# Patient Record
Sex: Female | Born: 1935 | Race: White | Hispanic: No | Smoking: Never smoker
Health system: Southern US, Community
[De-identification: ages and names within clinical notes are randomized; demographics above are authoritative.]

## PROBLEM LIST (undated history)

## (undated) DIAGNOSIS — E119 Type 2 diabetes mellitus without complications: Secondary | ICD-10-CM

## (undated) DIAGNOSIS — C50919 Malignant neoplasm of unspecified site of unspecified female breast: Secondary | ICD-10-CM

## (undated) DIAGNOSIS — Z8719 Personal history of other diseases of the digestive system: Secondary | ICD-10-CM

## (undated) DIAGNOSIS — K589 Irritable bowel syndrome without diarrhea: Secondary | ICD-10-CM

## (undated) DIAGNOSIS — G709 Myoneural disorder, unspecified: Secondary | ICD-10-CM

## (undated) DIAGNOSIS — K219 Gastro-esophageal reflux disease without esophagitis: Secondary | ICD-10-CM

## (undated) DIAGNOSIS — M1990 Unspecified osteoarthritis, unspecified site: Secondary | ICD-10-CM

## (undated) DIAGNOSIS — E785 Hyperlipidemia, unspecified: Secondary | ICD-10-CM

## (undated) DIAGNOSIS — E875 Hyperkalemia: Secondary | ICD-10-CM

## (undated) DIAGNOSIS — I10 Essential (primary) hypertension: Secondary | ICD-10-CM

## (undated) DIAGNOSIS — N189 Chronic kidney disease, unspecified: Secondary | ICD-10-CM

## (undated) DIAGNOSIS — M810 Age-related osteoporosis without current pathological fracture: Secondary | ICD-10-CM

## (undated) DIAGNOSIS — Z9221 Personal history of antineoplastic chemotherapy: Secondary | ICD-10-CM

## (undated) HISTORY — PX: COLONOSCOPY: SHX174

## (undated) HISTORY — PX: SKIN CANCER EXCISION: SHX779

## (undated) HISTORY — DX: Essential (primary) hypertension: I10

## (undated) HISTORY — DX: Type 2 diabetes mellitus without complications: E11.9

## (undated) HISTORY — DX: Age-related osteoporosis without current pathological fracture: M81.0

## (undated) HISTORY — DX: Hyperlipidemia, unspecified: E78.5

## (undated) HISTORY — DX: Chronic kidney disease, unspecified: N18.9

## (undated) HISTORY — PX: TONSILLECTOMY: SUR1361

---

## 1973-06-24 HISTORY — PX: ABDOMINAL HYSTERECTOMY: SHX81

## 2000-07-04 ENCOUNTER — Encounter: Admission: RE | Admit: 2000-07-04 | Discharge: 2000-07-04 | Payer: Self-pay | Admitting: Internal Medicine

## 2000-07-04 ENCOUNTER — Encounter: Payer: Self-pay | Admitting: Internal Medicine

## 2002-03-04 ENCOUNTER — Ambulatory Visit (HOSPITAL_COMMUNITY): Admission: RE | Admit: 2002-03-04 | Discharge: 2002-03-04 | Payer: Self-pay | Admitting: Gastroenterology

## 2002-03-04 ENCOUNTER — Encounter (INDEPENDENT_AMBULATORY_CARE_PROVIDER_SITE_OTHER): Payer: Self-pay | Admitting: Specialist

## 2006-04-01 ENCOUNTER — Inpatient Hospital Stay (HOSPITAL_COMMUNITY): Admission: AD | Admit: 2006-04-01 | Discharge: 2006-04-05 | Payer: Self-pay | Admitting: Internal Medicine

## 2006-04-17 ENCOUNTER — Encounter: Admission: RE | Admit: 2006-04-17 | Discharge: 2006-04-17 | Payer: Self-pay | Admitting: Internal Medicine

## 2007-01-15 ENCOUNTER — Encounter: Admission: RE | Admit: 2007-01-15 | Discharge: 2007-01-15 | Payer: Self-pay | Admitting: Orthopaedic Surgery

## 2007-06-25 HISTORY — PX: CATARACT EXTRACTION: SUR2

## 2009-03-14 ENCOUNTER — Encounter: Admission: RE | Admit: 2009-03-14 | Discharge: 2009-03-14 | Payer: Self-pay | Admitting: Rheumatology

## 2010-09-12 ENCOUNTER — Other Ambulatory Visit: Payer: Self-pay | Admitting: Dermatology

## 2010-11-09 NOTE — H&P (Signed)
NAMECANDY, LOHN                 ACCOUNT NO.:  [id number]   MEDICAL RECORD NO.:  [id number]          PATIENT TYPE:  INP   LOCATION:  5149                         FACILITY:  [facility]   PHYSICIAN:  Nehemiah Settle, M.D.    DATE OF BIRTH:  12-Dec-1931   DATE OF ADMISSION:  04/01/2006  DATE OF DISCHARGE:                                HISTORY & PHYSICAL   ADMIT HISTORY AND PHYSICAL:   CHIEF COMPLAINT:  Nausea, weakness.   HISTORY OF PRESENT ILLNESS:  Ms. Bruggeman is a 77 year old white female patient  of Dr. Nehemiah Settle who presents with a 10-day acute illness that began  with abdominal cramping and bloody stools.  She was seen in the [facility] at that time and diagnosed with a probable diverticular bleed.  She was discharged on Cipro and Flagyl.  In followup with our office 2 days  later, complained of feeling weak, but overall improved with complete  resolution of bloody stools and marked improvement of her abdominal pain.  CBC at that time showed hemoglobin of 12.5 and white cell count 11,100.  She  developed nausea after her followup visit and was advised to stop the  Flagyl.  She stopped Flagyl and Cipro on March 28, 2006, but the nausea  persisted.  On her followup visit to the office today, a CBC was  unremarkable, but chemistry revealed a creatinine of 8.5 with BUN of 51.  She is being admitted for the same.   REVIEW OF SYSTEMS:  No fever; some chills, fatigued, slightly dizzy.  Chest:  Denies cough, chest pain or shortness of breath.  GI:  Denies abdominal  pain.  No rectal bleeding.  Has a loose, brown stool every day.  Had 1 black  stool last week.  Nausea, as above.  GU:  Denies problems.  Extremities:  Has had no edema.   PAST MEDICAL HISTORY:  1. Allergic rhinitis.  2. GERD.  3. Gout.  4. IBS.  5. Osteoarthritis.  6. Hypertension.  7. Hypercholesterolemia.   PAST SURGICAL HISTORY:  1. Hysterectomy.  2. Basal cell epithelioma.   MEDICATIONS:  1.  Prilosec OTC 20 mg 1 a day.  2. Premarin 0.625 mg a day.  3. Denavir p.r.n. for fever blisters.  4. Viactiv t.i.d.  5. Lisinopril/hydrochlorothiazide 20/25 one a day.  6. Vitamin D 50,000 units twice monthly.  7. Lipitor 20 mg daily.  8. Valium 5 mg 1/2 tablet at bedtime daily p.r.n.   ALLERGIES:  1. SULFA.  2. CODEINE.  3. Intolerance to FLAGYL.   SOCIAL HISTORY:  Denies smoking.  Occasional ETOH.   FAMILY HISTORY:  Father died at age 79 from a cerebral hemorrhage.  Mother  died at age 70.  She had hypertension and osteoporotic fractures.   OBJECTIVE DATA:  VITAL SIGNS:  Weight is 157, which is stable.  Temperature  97.6.  Pulse 68.  Respirations 20.  Blood pressure is 100/60.  She is not  orthostatic.  GENERAL:  She is pale; appears mildly ill.  HEENT:  Okay.  NECK:  No JVD.  No adenopathy.  HEART:  Normal S1, S2 without murmur or S3.  LUNGS:  Breath sounds are clear throughout.  ABDOMEN:  Bowel sounds present, all 4 quadrants.  Very slight tenderness in  the left lower quadrant, but there is no guarding or rebounding.  RECTAL:  Heme-negative stool.  EXTREMITY EXAM:  Some tenderness at the left great toe MTP joint consistent  with flare of gout.  NEUROLOGICAL EXAM:  Cranial nerves 2-12 intact.  Gait steady.  Speech  appropriate.   LABORATORY DATA:  CBC reveals hemoglobin 12.5, hematocrit 37.0, RBC 3.73,  MCV 99.4, MCHC 33.7, RDW 11.3, platelet count 241,000, WBC count was 6100.  Chemistry test reveals glucose 138, BUN 51, creatinine 8.5, sodium 138,  potassium 3.7, chloride 99, CO2 24, calcium 9.2, total protein 6.2, albumin  3.4, anion gap 18.7, total bili 0.7, ALP 82, AST 20, ALT 19.  Urinalysis  unremarkable.   IMPRESSION:  1. Acute renal failure.  2. Recent abdominal pain and gastrointestinal bleed.   PLAN:  Admit.  Clear liquids.  IV fluids.  Get renal consult and renal  ultrasound.  Dr. Maxwell Caul and associates to follow.      Tamera C. Judeen Hammans, M.D.  Electronically Signed    TCL/MEDQ  D:  04/01/2006  T:  04/02/2006  Job:  [id number]

## 2010-11-09 NOTE — Op Note (Signed)
   Rebecca Hall, Rebecca Hall                           ACCOUNT NO.:  [id number]   MEDICAL RECORD NO.:  [id number]                   PATIENT TYPE:  AMB   LOCATION:  ENDO                                 FACILITY:  [facility]   PHYSICIAN:  Garlan Fair, M.D.             DATE OF BIRTH:  28-Jul-1931   DATE OF PROCEDURE:  03/04/2002  DATE OF DISCHARGE:                                 OPERATIVE REPORT   REFERRING PHYSICIAN:  Jeneen Rinks C. Maxwell Caul, M.D.   PROCEDURE PERFORMED:  Colonoscopy.   ENDOSCOPIST:  Garlan Fair, M.D.   INDICATIONS FOR PROCEDURE:  The patient is a 77 year old  female born  [birth year].  The patient is scheduled to undergo a diagnostic  colonoscopy to evaluate intermittent painless hematochezia.  A recent  anoscopy revealed internal hemorrhoids with stigmata of recent bleeding.   I discussed with the patient the complications associated with colonoscopy  and polypectomy including a 15 per 1000 risk of bleeding and 4 per 1000 risk  of colon perforation requiring surgical repair.  The patient has signed the  operative permit.   PREMEDICATION:  Fentanyl 50 mcg, Versed 5 mg.   INSTRUMENT USED:  Pediatric Olympus video colonoscope.   DESCRIPTION OF PROCEDURE:  After obtaining informed consent, the patient was  placed in the left lateral decubitus position.  I administered intravenous  fentanyl and intravenous Versed to achieve conscious sedation for the  procedure.  The patient's blood pressure, oxygen saturations and cardiac  rhythm were monitored throughout the procedure and documented in the medical  record.   Anal inspection was normal.  Digital rectal exam was normal.  The pediatric  Olympus video colonoscope was introduced into the rectum and easily advanced  to the cecum.  Colonic preparation for the exam today was excellent.   Rectum:  Normal.   Sigmoid colon and descending colon:  Normal.   Splenic flexure:  From the splenic flexure, a 2 mm sessile polyp  was removed  with the electrocautery snare and submitted for pathological interpretation.   Transverse colon:  Normal.   Hepatic flexure:  Normal.   Ascending colon:  Normal.   Ileocecal valve and cecum:  Normal.   ASSESSMENT:  A 2 mm sessile polyp was removed from the splenic flexure and  submitted for pathological interpretation;  otherwise normal  proctocolonoscopy to the cecum.   RECOMMENDATIONS:  Repeat colonoscopy in five years.                                                 Garlan Fair, M.D.    MKJ/MEDQ  D:  03/04/2002  T:  03/04/2002  Job:  [id number]   cc:   Alysia Penna. Maxwell Caul, M.D.

## 2010-11-09 NOTE — Consult Note (Signed)
Rebecca Hall, Rebecca Hall                 ACCOUNT NO.:  [id number]   MEDICAL RECORD NO.:  [id number]          PATIENT TYPE:  INP   LOCATION:  5149                         FACILITY:  [facility]   PHYSICIAN:  Alvin C. Florene Glen, M.D.  DATE OF BIRTH:  23-Mar-1932   DATE OF CONSULTATION:  DATE OF DISCHARGE:                                   CONSULTATION   I was asked by Dr. Maxwell Caul to see this 77 year old female who is admitted  with acute renal failure.  The patient is followed by Dr. Maxwell Caul and has  about a 10-day history of illnesses accompanied with abdominal cramping and  bloody stools.  She was felt to have diverticular disease, when seen at  [facility], and treated with Flagyl and Cipro, but had some intolerance  to medication with nausea, but had symptomatic improvement of the bloody  stools.  On visit day, she continued to have some malaise and intermittent  dizziness and had laboratory studies done and was found to have a serum  creatinine of 8.1 mg/dL with a BUN of 51 and she is admitted for further  evaluation and treatment.   The patient denies a prior history of kidney problems.  She does not  describe a lower extremity rash associated with any of these symptoms.  Urinalysis done today did show evidence of a specific gravity 1.025, pH 5,  trace blood, no protein was present.  Hemoglobin was 12.5 grams.  Of note,  on June27,2007, serum creatinine was 1.2 mg/dL.   The patient's past history includes:  1. Irritable bowel syndrome.  2. Hypertension.  3. Hypercholesteremia.  4. Gout.  5. GERD.  6. Allergic rhinitis.  7. Prior hysterectomy.  8. Basal cell tumor.   CURRENT MEDICATIONS:  Includes Prilosec, Premarin, vitamin D, Lipitor,  Valium, lisinopril, HCT, vitamin E, vitamin C, vitamin D.   PHYSICAL EXAMINATION:  VITAL SIGNS:  Upon presentation the patient's blood  pressure was 74/50, according to the nursing staff; repeat was 87/50; and  repeat 93/45.  GENERAL:  She is a  pleasant female, quite talkative.  HEENT:  Atraumatic, normocephalic.  Neck veins are flat in the supine  position.  LUNGS:  Clear.  HEART:  Regular rhythm rate.  ABDOMEN:  Mild tenderness diffusely, increased in the left lower quadrant  but this is not dramatic.  EXTREMITIES:  Without significant edema.   ASSESSMENT:  Acute renal failure.  This is most likely hemodynamically  mediated due to hypotension probably from gastrointestinal losses and lack  of oral intake, superimposed upon ACE inhibitor therapy which exacerbated  the hemodynamic effects.  I do not believe this is Henoch-Schonlein purpura  (HSP).   RECOMMENDATIONS:  1. Hold the ACE inhibitor.  2. Continue with intravenous fluids until blood pressure rises.  3. Urinalysis.  4. Renal ultrasound.  5. Foley catheter placement.   Thanks for letting us see this patient.      Darrold Span. Florene Glen, M.D.  Electronically Signed     ACP/MEDQ  D:  04/01/2006  T:  04/02/2006  Job:  [id number]   cc:   Jeneen Rinks  Maxwell Caul, M.D.

## 2010-11-09 NOTE — Discharge Summary (Signed)
NAMEBRITTINIE, Rebecca Hall                 ACCOUNT NO.:  [id number]   MEDICAL RECORD NO.:  [id number]          PATIENT TYPE:  INP   LOCATION:  [id number]                         FACILITY:  [facility]   PHYSICIAN:  Nehemiah Settle, M.D.    DATE OF BIRTH:  [birth year]   DATE OF ADMISSION:  04/01/2006  DATE OF DISCHARGE:  04/05/2006                               DISCHARGE SUMMARY   ADMITTING DIAGNOSIS:  Acute renal failure.   DISCHARGE DIAGNOSES:  1. Acute renal failure secondary to dehydration.  2. Recent gastrointestinal (GI) illness.  3. Anemia.  4. Gastroesophageal reflux disease (GERD).  5. Probable gout.  6. Arm pain ? Etiology.  7. Gallstone.  8. Hypertension.  9. Hypercholesterolemia.   HISTORY OF PRESENT ILLNESS:  The patient is a 77 year old white female  who had a 10-day illness that began with abdominal cramping and bloody  stools.  She was seen in the [facility] and diagnosed with  probable diverticular bleed.  She was placed on Cipro and Flagyl.  She  had a general feeling of weakness 2 days later when seen in our office,  but her bloody stools and diarrhea had resolved.  CBC was normal and  white count was slightly elevated.  Subsequent to that, she was seen in  our office a week later and found to be nauseated and still felt very  poorly.  CBC was normal but chemistries revealed a creatinine of 8.5 and  a BUN of 51.   HOSPITAL COURSE:  The patient was admitted and was placed on IV fluids.  She was seen in consultation by renal who thought the patient had acute  renal failure that was hemodynamically mediated through hypertension,  ACE inhibition, and low p.o. intake.  They recommended holding her ACE  inhibitor and giving her IV fluid, increase her blood pressure.  A Foley  catheter was placed.  Over the subsequent several days, her creatinine  fell and was eventually in the 2.3 range when she was discharged.  She  felt much better.  She did have an attack of gout in her  left foot with  a tender bursae TP with mild heat.  She was treated with Prednisone for  that.  She was mildly anemic and her iron studies were consistent with  anemia of chronic disease.  She was noted to have hypercholesterolemia  but her Lipitor was being held because of her general medical condition.  She steadily improved and was discharged for followup in our office.   DISCHARGE MEDICATIONS:  1. Prednisone 10 mg b.i.d. x3 days.  2. Premarin 0.625 mg daily.  3. Prilosec 20 mg daily.  She is to hold Lisinopril and HCT as well as Lipitor for now.   FOLLOWUP:  She will call to make an appointment for metabolic profile on  October 15th.  She will call us if she has any further problems.   DISCHARGE DIET:  As tolerated.   ACTIVITY:  As tolerated.      Nehemiah Settle, M.D.  Electronically Signed     JO/MEDQ  D:  05/14/2006  T:  05/14/2006  Job:  [id number]

## 2012-06-24 HISTORY — PX: BREAST BIOPSY: SHX20

## 2012-06-24 HISTORY — PX: BREAST LUMPECTOMY: SHX2

## 2012-10-14 ENCOUNTER — Ambulatory Visit (INDEPENDENT_AMBULATORY_CARE_PROVIDER_SITE_OTHER): Payer: Medicare Other

## 2012-10-14 ENCOUNTER — Ambulatory Visit (INDEPENDENT_AMBULATORY_CARE_PROVIDER_SITE_OTHER): Payer: Medicare Other | Admitting: Neurology

## 2012-10-14 DIAGNOSIS — R209 Unspecified disturbances of skin sensation: Secondary | ICD-10-CM

## 2012-10-14 DIAGNOSIS — G63 Polyneuropathy in diseases classified elsewhere: Secondary | ICD-10-CM

## 2012-10-14 DIAGNOSIS — Z0289 Encounter for other administrative examinations: Secondary | ICD-10-CM

## 2012-10-14 NOTE — Procedures (Signed)
HISTORY:  Rebecca Hall is a 79 year old patient with a two-year history of numbness and tingling sensations in the feet bilaterally. The patient has noted some slight gait instability, but she denies any falls. The patient also reports low back pain without radiation into the legs. The patient is being evaluated for a possible peripheral neuropathy.  NERVE CONDUCTION STUDIES:  Nerve conduction studies were performed on all 4 extremities. The distal motor latencies and motor amplitudes for the median and ulnar nerves were normal bilaterally. The F wave latencies and nerve conduction velocities for these nerves were normal bilaterally. The sensory latencies for the median nerves were normal bilaterally, slightly prolonged for the right ulnar nerve, normal for the left ulnar nerve.  Nerve conduction studies on the lower extremities revealed no response for the left peroneal nerve, with a normal distal motor latency and borderline normal motor amplitude for the right peroneal nerve. The distal motor latencies and motor amplitudes for the posterior tibial nerves were normal bilaterally. The nerve conduction velocities for the right peroneal nerve and for the posterior tibial nerves were normal bilaterally. The peroneal sensory latencies were absent bilaterally. The H reflex latencies were absent bilaterally.  EMG STUDIES:  EMG study was performed on the right lower extremity:  The tibialis anterior muscle reveals 2 to 3K motor units with full recruitment. No fibrillations or positive waves were seen. The peroneus tertius muscle reveals 2 to 3K motor units with full recruitment. No fibrillations or positive waves were seen. The medial gastrocnemius muscle reveals 1 to 3K motor units with full recruitment. No fibrillations or positive waves were seen. The vastus lateralis muscle reveals 2 to 4K motor units with full recruitment. No fibrillations or positive waves were seen. The iliopsoas muscle reveals 2  to 4K motor units with full recruitment. No fibrillations or positive waves were seen. The biceps femoris muscle (long head) reveals 2 to 4K motor units with full recruitment. No fibrillations or positive waves were seen. The lumbosacral paraspinal muscles were tested at 3 levels, and revealed no abnormalities of insertional activity at all 3 levels tested. There was good relaxation.  EMG study was performed on the left lower extremity:  The tibialis anterior muscle reveals 2 to 4K motor units with full recruitment. No fibrillations or positive waves were seen. The peroneus tertius muscle reveals 2 to 4K motor units with full recruitment. No fibrillations or positive waves were seen. The medial gastrocnemius muscle reveals 1 to 3K motor units with full recruitment. No fibrillations or positive waves were seen. The vastus lateralis muscle reveals 2 to 4K motor units with full recruitment. No fibrillations or positive waves were seen. The iliopsoas muscle reveals 2 to 4K motor units with full recruitment. No fibrillations or positive waves were seen. The biceps femoris muscle (long head) reveals 2 to 4K motor units with full recruitment. No fibrillations or positive waves were seen. The lumbosacral paraspinal muscles were tested at 3 levels, and revealed no abnormalities of insertional activity at all 3 levels tested. There was good relaxation.    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 Nerve conduction studies suggest a significant left peroneal neuropathy, but EMG evaluation of both lower extremities indicates that the left peroneal neuropathy is likely distal in nature, not at the fibular head. No evidence of an overlying lumbosacral radiculopathy was seen on either side.  Jill Alexanders MD 10/14/2012 4:45 PM  [facility] [address] [place], [place] [address]  Phone [phone] Fax [phone]

## 2013-03-15 ENCOUNTER — Other Ambulatory Visit: Payer: Self-pay

## 2013-03-15 DIAGNOSIS — C50919 Malignant neoplasm of unspecified site of unspecified female breast: Secondary | ICD-10-CM

## 2013-03-15 HISTORY — DX: Malignant neoplasm of unspecified site of unspecified female breast: C50.919

## 2013-03-29 ENCOUNTER — Ambulatory Visit (INDEPENDENT_AMBULATORY_CARE_PROVIDER_SITE_OTHER): Payer: Medicare Other | Admitting: General Surgery

## 2013-03-29 ENCOUNTER — Encounter (INDEPENDENT_AMBULATORY_CARE_PROVIDER_SITE_OTHER): Payer: Self-pay | Admitting: General Surgery

## 2013-03-29 VITALS — BP 120/72 | HR 72 | Temp 98.2°F | Resp 14 | Ht 61.0 in | Wt 157.4 lb

## 2013-03-29 DIAGNOSIS — C50419 Malignant neoplasm of upper-outer quadrant of unspecified female breast: Secondary | ICD-10-CM

## 2013-03-29 DIAGNOSIS — C50411 Malignant neoplasm of upper-outer quadrant of right female breast: Secondary | ICD-10-CM

## 2013-03-29 NOTE — Progress Notes (Signed)
Patient ID: Rebecca Hall, female   DOB: 14-Aug-1931, 79 y.o.   MRN: [id number]  Chief Complaint  Patient presents with  . New Evaluation    eval new br cancer    HPI Rebecca Hall is a 79 y.o. female.  Referred by Dr Christene Slates HPI 45 yof who has no complaints referable to either breast and presents after undergoing routine screening mm that showed a 4 mm asymmetry in the right breast.  US showed an irregular mass.  US guided biopsy was obtained of the solid mass located in the 12 oclock position anterior depth 5 cm from nipple.  A tissue marker was placed.  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  She comes in with her family to disuss her options.  Past Medical History  Diagnosis Date  . Hyperlipidemia   . Hypertension   . Chronic kidney disease   . Osteoporosis     Past Surgical History  Procedure Laterality Date  . Uterine fibroid surgery      removed utures  . Cataract extraction  2009    both  . Skin cancer excision      head, face, hand ..multiple years    Family History  Problem Relation Age of Onset  . Cancer Maternal Aunt     colon cancer  . Cancer Maternal Uncle     lung  . Cancer Maternal Grandmother     pancreas    Social History History  Substance Use Topics  . Smoking status: Never Smoker   . Smokeless tobacco: Never Used  . Alcohol Use: No    Allergies  Allergen Reactions  . Levaquin [Levofloxacin In D5w] Anaphylaxis and Nausea Only    Shaking real bad on medicaton  . Codeine Nausea And Vomiting    Pass out  . Sulfa Antibiotics Swelling    Current Outpatient Prescriptions  Medication Sig Dispense Refill  . allopurinol (ZYLOPRIM) 100 MG tablet       . allopurinol (ZYLOPRIM) 300 MG tablet Take 300 mg by mouth daily.      Marland Kitchen aspirin 325 MG tablet Take 325 mg by mouth daily.      Marland Kitchen atorvastatin (LIPITOR) 20 MG tablet       . BOOSTRIX 5-2.5-18.5 injection       .  calcium carbonate (OS-CAL) 600 MG TABS tablet Take 600 mg by mouth 2 (two) times daily with a meal.      . diclofenac sodium (VOLTAREN) 1 % GEL Apply 2 g topically.      . diphenhydramine-acetaminophen (TYLENOL PM) 25-500 MG TABS Take 1 tablet by mouth at bedtime as needed.      Marland Kitchen estradiol (CLIMARA - DOSED IN MG/24 HR) 0.05 mg/24hr patch Place 1 patch onto the skin once a week.      . furosemide (LASIX) 40 MG tablet       . lisinopril (PRINIVIL,ZESTRIL) 20 MG tablet Take 20 mg by mouth daily.      Marland Kitchen LORazepam (ATIVAN) 0.5 MG tablet       . minoxidil (ROGAINE) 2 % external solution Apply topically 2 (two) times daily.      . Multiple Vitamin (MULTIVITAMIN WITH MINERALS) TABS tablet Take 1 tablet by mouth daily.      Marland Kitchen omeprazole (PRILOSEC) 20 MG capsule       . predniSONE (DELTASONE) 5 MG tablet       . Vitamin D,  Ergocalciferol, (DRISDOL) 50000 UNITS CAPS capsule Take 50,000 Units by mouth.       No current facility-administered medications for this visit.    Review of Systems Review of Systems  Constitutional: Negative for fever, chills and unexpected weight change.  HENT: Negative for hearing loss, congestion, sore throat, trouble swallowing and voice change.   Eyes: Negative for visual disturbance.  Respiratory: Negative for cough and wheezing.   Cardiovascular: Positive for leg swelling. Negative for chest pain and palpitations.  Gastrointestinal: Positive for diarrhea. Negative for nausea, vomiting, abdominal pain, constipation, blood in stool, abdominal distention and anal bleeding.  Genitourinary: Negative for hematuria, vaginal bleeding and difficulty urinating.  Musculoskeletal: Positive for arthralgias.  Skin: Negative for rash and wound.  Neurological: Positive for headaches. Negative for seizures and syncope.  Hematological: Negative for adenopathy. Does not bruise/bleed easily.  Psychiatric/Behavioral: Negative for confusion.    Blood pressure 120/72, pulse 72,  temperature 98.2 F (36.8 C), temperature source Temporal, resp. rate 14, height 5\' 1"  (1.549 m), weight 157 lb 6.4 oz (71.396 kg).  Physical Exam Physical Exam  Constitutional: She appears well-developed and well-nourished.  Cardiovascular: Normal rate, regular rhythm and normal heart sounds.   Pulmonary/Chest: Effort normal and breath sounds normal. She has no wheezes. She has no rales. Right breast exhibits no inverted nipple, no mass, no nipple discharge, no skin change and no tenderness. Left breast exhibits no inverted nipple, no mass, no nipple discharge, no skin change and no tenderness.  Lymphadenopathy:    She has no cervical adenopathy.    Data Reviewed Mm, [place], path reviewed  Assessment    Right breast cancer, clinical stage I    Plan    Breast MRI followed likely by surgery  We discussed multiple options and decided esp with her2 positivity to pursue mr.  She understands this likely does not help with survival and may require more biopsies.  i think for her this is reasonable though and she would very much like to pursue.  After that if nothing else shows up I think she is a good candidate for lump/sn. We Hall hold on port as decision for chemo/anti her2 therapy Hall be based on final pathology We discussed the staging and pathophysiology of breast cancer. We discussed all of the different options for treatment for breast cancer including surgery, chemotherapy, radiation therapy, Herceptin, and antiestrogen therapy.   We discussed a sentinel lymph node biopsy as she does not appear to having lymph node involvement right now. We discussed the performance of that with injection of radioactive tracer and blue dye. We discussed that she would have an incision underneath her axillary hairline. We discussed that there is a chance of having a positive node with a sentinel lymph node biopsy and we Hall await the permanent pathology to make any other first further decisions in terms of  her treatment. One of these options might be to return to the operating room to perform an axillary lymph node dissection. We discussed about a 1-2% risk lifetime of chronic shoulder pain as well as lymphedema associated with a sentinel lymph node biopsy.  We discussed the options for treatment of the breast cancer which included lumpectomy versus a mastectomy. We discussed the performance of the lumpectomy with a wire placement. We discussed a 10-20% chance of a positive margin requiring reexcision in the operating room. We also discussed that she may need radiation therapy or antiestrogen therapy or both if she undergoes lumpectomy. We discussed the mastectomy and  the postoperative care for that as well. We discussed that there is no difference in her survival whether she undergoes lumpectomy with radiation therapy or antiestrogen therapy versus a mastectomy.  We discussed the risks of operation including bleeding, infection, possible reoperation. She understands her further therapy Hall be based on what her stages at the time of her operation.         Yashika Mask 03/29/2013, 10:39 PM

## 2013-03-30 ENCOUNTER — Telehealth (INDEPENDENT_AMBULATORY_CARE_PROVIDER_SITE_OTHER): Payer: Self-pay | Admitting: *Deleted

## 2013-03-30 NOTE — Telephone Encounter (Signed)
Spoke with pt to inform her of her appt at [facility] at [address] for her MRI on 04/05/13 with an arrival time of 4:15.

## 2013-04-05 ENCOUNTER — Ambulatory Visit
Admission: RE | Admit: 2013-04-05 | Discharge: 2013-04-05 | Disposition: A | Discharge status: Home / Self Care | Payer: Medicare Other | Source: Ambulatory Visit | Attending: General Surgery | Admitting: General Surgery

## 2013-04-05 DIAGNOSIS — C50411 Malignant neoplasm of upper-outer quadrant of right female breast: Secondary | ICD-10-CM

## 2013-04-05 MED ORDER — GADOBENATE DIMEGLUMINE 529 MG/ML IV SOLN
14.0000 mL | Freq: Once | INTRAVENOUS | Status: AC | PRN
  Administered 2013-04-05: 14 mL via INTRAVENOUS

## 2013-04-06 ENCOUNTER — Telehealth (INDEPENDENT_AMBULATORY_CARE_PROVIDER_SITE_OTHER): Payer: Self-pay | Admitting: *Deleted

## 2013-04-06 NOTE — Telephone Encounter (Signed)
Patient called to ask about her MRI results and if her surgery has been scheduled.  Explained that I would send a message to Dr. Donne Hazel and Lars Mage for an update.  Patient states understanding.

## 2013-04-07 ENCOUNTER — Other Ambulatory Visit (INDEPENDENT_AMBULATORY_CARE_PROVIDER_SITE_OTHER): Payer: Self-pay | Admitting: General Surgery

## 2013-04-07 DIAGNOSIS — R928 Other abnormal and inconclusive findings on diagnostic imaging of breast: Secondary | ICD-10-CM

## 2013-04-07 NOTE — Telephone Encounter (Signed)
i will call her later today.  Just saw this am. They recommended mr biopsy but I will plan on scheduling her if you can let me know when this is scheduled

## 2013-04-08 ENCOUNTER — Other Ambulatory Visit (INDEPENDENT_AMBULATORY_CARE_PROVIDER_SITE_OTHER): Payer: Self-pay | Admitting: General Surgery

## 2013-04-08 DIAGNOSIS — C50411 Malignant neoplasm of upper-outer quadrant of right female breast: Secondary | ICD-10-CM

## 2013-04-08 MED ORDER — DIAZEPAM 5 MG PO TABS: 5.0000 mg | ORAL_TABLET | Freq: Once | ORAL | Status: DC

## 2013-04-08 NOTE — Telephone Encounter (Signed)
Pt calling today with more questions regarding her MR guided bx.  She is scheduled for her second bx of her left breast on 04/12/13 at 7:15a.m.  She still has several questions, and is VERY anxious about having a second procedure.  She did not do well with her first MR and would like to know if Dr. Donne Hazel would give her something to calm her nerves.  I explained that I would relay her concerns to Dr. Donne Hazel and ask him to call her today if possible.  Pt was appreciative.

## 2013-04-09 ENCOUNTER — Other Ambulatory Visit (INDEPENDENT_AMBULATORY_CARE_PROVIDER_SITE_OTHER): Payer: Self-pay

## 2013-04-12 ENCOUNTER — Ambulatory Visit
Admission: RE | Admit: 2013-04-12 | Discharge: 2013-04-12 | Disposition: A | Discharge status: Home / Self Care | Payer: Medicare Other | Source: Ambulatory Visit | Attending: General Surgery | Admitting: General Surgery

## 2013-04-12 ENCOUNTER — Other Ambulatory Visit: Payer: Self-pay | Admitting: Diagnostic Radiology

## 2013-04-12 DIAGNOSIS — R928 Other abnormal and inconclusive findings on diagnostic imaging of breast: Secondary | ICD-10-CM

## 2013-04-12 HISTORY — PX: BREAST BIOPSY: SHX20

## 2013-04-12 MED ORDER — GADOBENATE DIMEGLUMINE 529 MG/ML IV SOLN
14.0000 mL | Freq: Once | INTRAVENOUS | Status: AC | PRN
  Administered 2013-04-12: 14 mL via INTRAVENOUS

## 2013-04-21 ENCOUNTER — Telehealth (INDEPENDENT_AMBULATORY_CARE_PROVIDER_SITE_OTHER): Payer: Self-pay | Admitting: General Surgery

## 2013-04-21 DIAGNOSIS — C50911 Malignant neoplasm of unspecified site of right female breast: Secondary | ICD-10-CM

## 2013-04-21 NOTE — Telephone Encounter (Signed)
I discussed other biopsies which are negative.  She has 4 mm tumor on mm/us that is larger on mr and her 2 positive.  Will see if she can see oncology prior to next week but would wait on port likely due to size discrepancy.  Will plan on lumpectomy/sn next week

## 2013-04-22 ENCOUNTER — Encounter (HOSPITAL_BASED_OUTPATIENT_CLINIC_OR_DEPARTMENT_OTHER): Payer: Self-pay | Admitting: *Deleted

## 2013-04-22 ENCOUNTER — Telehealth: Payer: Self-pay | Admitting: *Deleted

## 2013-04-22 DIAGNOSIS — C50412 Malignant neoplasm of upper-outer quadrant of left female breast: Secondary | ICD-10-CM

## 2013-04-22 DIAGNOSIS — C50419 Malignant neoplasm of upper-outer quadrant of unspecified female breast: Secondary | ICD-10-CM | POA: Insufficient documentation

## 2013-04-22 NOTE — Telephone Encounter (Signed)
Sent Dawn a staff message asking her to help with arranging this appt ASAP before the pt's sx on 04/28/13.

## 2013-04-22 NOTE — Telephone Encounter (Signed)
Called pt back to inform the appt is at 1100/11:30.  Received verbal understanding.

## 2013-04-22 NOTE — Telephone Encounter (Signed)
Gave pt husband [name] appt for 04/23/13 at 1200/1230 to see Dr. Humphrey Rolls.  Informed Dr. Donne Hazel.

## 2013-04-22 NOTE — Progress Notes (Signed)
To come in for labs and ekg

## 2013-04-22 NOTE — Addendum Note (Signed)
Addended by: Illene Regulus on: 04/22/2013 09:03 AM   Modules accepted: Orders

## 2013-04-23 ENCOUNTER — Ambulatory Visit: Payer: Medicare Other | Admitting: Oncology

## 2013-04-23 ENCOUNTER — Other Ambulatory Visit (HOSPITAL_BASED_OUTPATIENT_CLINIC_OR_DEPARTMENT_OTHER): Payer: Medicare Other

## 2013-04-23 ENCOUNTER — Other Ambulatory Visit: Payer: Medicare Other | Admitting: Lab

## 2013-04-23 ENCOUNTER — Encounter: Payer: Self-pay | Admitting: Oncology

## 2013-04-23 ENCOUNTER — Encounter: Payer: Medicare Other | Admitting: Oncology

## 2013-04-23 ENCOUNTER — Other Ambulatory Visit (HOSPITAL_BASED_OUTPATIENT_CLINIC_OR_DEPARTMENT_OTHER): Payer: Medicare Other | Admitting: Lab

## 2013-04-23 ENCOUNTER — Ambulatory Visit: Payer: Medicare Other

## 2013-04-23 ENCOUNTER — Other Ambulatory Visit: Payer: Self-pay

## 2013-04-23 ENCOUNTER — Ambulatory Visit (HOSPITAL_BASED_OUTPATIENT_CLINIC_OR_DEPARTMENT_OTHER): Payer: Medicare Other

## 2013-04-23 ENCOUNTER — Ambulatory Visit (HOSPITAL_BASED_OUTPATIENT_CLINIC_OR_DEPARTMENT_OTHER): Payer: Medicare Other | Admitting: Oncology

## 2013-04-23 ENCOUNTER — Encounter (HOSPITAL_BASED_OUTPATIENT_CLINIC_OR_DEPARTMENT_OTHER)
Admission: RE | Admit: 2013-04-23 | Discharge: 2013-04-23 | Disposition: A | Discharge status: Home / Self Care | Payer: Medicare Other | Source: Ambulatory Visit | Attending: General Surgery | Admitting: General Surgery

## 2013-04-23 ENCOUNTER — Encounter: Payer: Self-pay | Admitting: *Deleted

## 2013-04-23 ENCOUNTER — Other Ambulatory Visit: Payer: Self-pay | Admitting: Oncology

## 2013-04-23 ENCOUNTER — Telehealth (INDEPENDENT_AMBULATORY_CARE_PROVIDER_SITE_OTHER): Payer: Self-pay

## 2013-04-23 DIAGNOSIS — G5790 Unspecified mononeuropathy of unspecified lower limb: Secondary | ICD-10-CM

## 2013-04-23 DIAGNOSIS — C50412 Malignant neoplasm of upper-outer quadrant of left female breast: Secondary | ICD-10-CM

## 2013-04-23 DIAGNOSIS — Z01812 Encounter for preprocedural laboratory examination: Secondary | ICD-10-CM | POA: Insufficient documentation

## 2013-04-23 DIAGNOSIS — G4700 Insomnia, unspecified: Secondary | ICD-10-CM

## 2013-04-23 DIAGNOSIS — Z01810 Encounter for preprocedural cardiovascular examination: Secondary | ICD-10-CM | POA: Insufficient documentation

## 2013-04-23 DIAGNOSIS — C50919 Malignant neoplasm of unspecified site of unspecified female breast: Secondary | ICD-10-CM

## 2013-04-23 DIAGNOSIS — Z170 Estrogen receptor positive status [ER+]: Secondary | ICD-10-CM

## 2013-04-23 DIAGNOSIS — C50411 Malignant neoplasm of upper-outer quadrant of right female breast: Secondary | ICD-10-CM

## 2013-04-23 LAB — CBC WITH DIFFERENTIAL/PLATELET
BASO%: 1.6 % (ref 0.0–2.0)
Basophils Absolute: 0.1 10*3/uL (ref 0.0–0.1)
EOS%: 3.7 % (ref 0.0–7.0)
Eosinophils Absolute: 0.2 10*3/uL (ref 0.0–0.5)
HCT: 35.7 % (ref 34.8–46.6)
HGB: 11.6 g/dL (ref 11.6–15.9)
LYMPH%: 30.8 % (ref 14.0–49.7)
MCH: 32.4 pg (ref 25.1–34.0)
MCHC: 32.5 g/dL (ref 31.5–36.0)
MCV: 99.8 fL (ref 79.5–101.0)
MONO#: 0.3 10*3/uL (ref 0.1–0.9)
MONO%: 6.8 % (ref 0.0–14.0)
NEUT#: 2.7 10*3/uL (ref 1.5–6.5)
NEUT%: 57.1 % (ref 38.4–76.8)
Platelets: 155 10*3/uL (ref 145–400)
RBC: 3.58 10*6/uL — ABNORMAL LOW (ref 3.70–5.45)
RDW: 14.3 % (ref 11.2–14.5)
WBC: 4.6 10*3/uL (ref 3.9–10.3)
lymph#: 1.4 10*3/uL (ref 0.9–3.3)

## 2013-04-23 LAB — COMPREHENSIVE METABOLIC PANEL (CC13)
ALT: 23 U/L (ref 0–55)
AST: 28 U/L (ref 5–34)
Albumin: 3.6 g/dL (ref 3.5–5.0)
Alkaline Phosphatase: 100 U/L (ref 40–150)
Anion Gap: 12 mEq/L — ABNORMAL HIGH (ref 3–11)
BUN: 12.8 mg/dL (ref 7.0–26.0)
CO2: 24 mEq/L (ref 22–29)
Calcium: 9.4 mg/dL (ref 8.4–10.4)
Chloride: 103 mEq/L (ref 98–109)
Creatinine: 1.1 mg/dL (ref 0.6–1.1)
Glucose: 251 mg/dl — ABNORMAL HIGH (ref 70–140)
Potassium: 2.9 mEq/L — CL (ref 3.5–5.1)
Sodium: 140 mEq/L (ref 136–145)
Total Bilirubin: 0.72 mg/dL (ref 0.20–1.20)
Total Protein: 6.9 g/dL (ref 6.4–8.3)

## 2013-04-23 MED ORDER — POTASSIUM CHLORIDE CRYS ER 20 MEQ PO TBCR: 20.0000 meq | EXTENDED_RELEASE_TABLET | Freq: Every day | ORAL | Status: DC

## 2013-04-23 NOTE — Progress Notes (Signed)
ID: Rebecca Hall OB: December 14, 1931  MR#: [id number]  [id number]  PCP: Rebecca Finer, MD GYN:   SU: Rebecca Hall OTHER MD: Rebecca Hall, Rebecca Hall,  Rebecca COMPLAINT: "They found I had breast cancer".  HISTORY OF PRESENT ILLNESS: Rebecca Hall had routine screening mammography at [facility] 03/04/2013 showing a possible mass in the right breast, measuring 4 mm in diameter. Additional views 03/10/2013 confirmed an area of asymmetry in the right breast which was hypoechoic by ultrasonography. Biopsy of this area 03/15/2013 showed (SAA [id number]) and invasive ductal carcinoma, grade 1 or 2, estrogen receptor 100% positive, progesterone receptor 100% positive, with a proliferation marker of 30% and HER-2 amplification by CISH with a ratio of 3.51 and an average HER-2 copy number of 6.85.  On 04/05/2013 the patient underwent bilateral breast MRIs. This showed in the right breast an 8 mm mass at the site of the known biopsy site, with surrounding known masslike enhancement. The entire area of concern measured 1.9 cm. There were no abnormal appearing lymph nodes of concern. In the left breast there were 2 areas of clumped non-masslike enhancement measuring 0.5 cm and 2.4 cm.  The patient's subsequent history is as detailed below  INTERVAL HISTORY: Rebecca Hall was evaluated in the breast clinic 04/23/2013. Her husband unfortunately was unable to accompany her because of his own medical problems.  REVIEW OF SYSTEMS: There were no particular symptoms leading to her mammography, which was routine. She did not feel a mass in the breast and was unable to feel a mass even after she knew it was clear. She has fairly chronic sinus symptoms, and recently was found to have a little bit of wheezing so she was treated with antibiotics and an inhaler temporarily. She doesn't have a cough, phlegm production or fever. She tends to be more diarrhea than the constipation side. She denies unusual headaches, visual changes,  nausea, or vomiting. She has mild stress urinary incontinence. She does not exercise regularly, but what she likes to do is swim and walk. Her peripheral neuropathy, involving both feet, limits her walking. She does have a stationary bike at home which is currentlydisassembled. She has chronic mild insomnia problems. Occasionally her ankles swell. She has chronic heartburn, which is well managed. She has low back pain, which was evaluated with a lumbar MRI 03/14/2009 showing degenerative disease only. She has a history of gout. Otherwise a detailed review of systems today was noncontributory  PAST MEDICAL HISTORY: Past Medical History  Diagnosis Date  . Hyperlipidemia   . Hypertension   . Chronic kidney disease   . Osteoporosis   . GERD (gastroesophageal reflux disease)   . IBS (irritable bowel syndrome)   . Arthritis   . Neuromuscular disorder     peripheral neuropathy feet  . History of lower GI bleeding     PAST SURGICAL HISTORY: Past Surgical History  Procedure Laterality Date  . Cataract extraction  2009    both  . Skin cancer excision      head, face, hand ..multiple years  . Abdominal hysterectomy  1975    No salpingo-oophorectomy  . Tonsillectomy    . Colonoscopy      FAMILY HISTORY Family History  Problem Relation Age of Onset  . Cancer Maternal Aunt     colon cancer  . Cancer Maternal Uncle     lung  . Cancer Maternal Grandmother     pancreas   the patient's father died at the age of 36 from a subarachnoid hemorrhage. The patient's  mother lived to be and 11.48 years old. She lived independently to be and. The patient had one brother, no sisters. There is a history of colon cancer or in a maternal aunts, diagnosed at age 16, and lung cancer in a maternal uncle diagnosed at age 63. There is no history of breast or ovarian cancer in the family  GYNECOLOGIC HISTORY:  Rebecca Hall can't remember when she had her first period. First live birth came at age 87 and she is Rebecca Hall 2.  She she took hormone replacement, namely estrogen, until the 20 14th when she was diagnosed with breast cancer.  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 Her husband Rebecca Hall is retired, he is largely disabled secondary to back problems following a fall. Daughter Rebecca Hall is a retired Pharmacist, hospital living in [place]. 35 older son runs a Engineer, civil (consulting) and business in [place] and did be helps out with that. The patient's son Rebecca Hall lives in [place]. He is an Rebecca Hall. Altogether Rebecca Hall has 4 grandsons, 3 great-grandsons and one great-granddaughter    ADVANCED DIRECTIVES: In place   HEALTH MAINTENANCE: History  Substance Use Topics  . Smoking status: Never Smoker   . Smokeless tobacco: Never Used  . Alcohol Use: No     Colonoscopy: 2011  PAP: Status post hysterectomy  Bone density: Remote (at [facility])  Lipid panel:  Allergies  Allergen Reactions  . Levaquin [Levofloxacin In D5w] Anaphylaxis and Nausea Only    Shaking real bad on medicaton  . Codeine Nausea And Vomiting    Pass out  . Sulfa Antibiotics Swelling    Current Outpatient Prescriptions  Medication Sig Dispense Refill  . allopurinol (ZYLOPRIM) 100 MG tablet Take 100 mg by mouth daily.       Marland Kitchen allopurinol (ZYLOPRIM) 300 MG tablet Take 300 mg by mouth daily.      Marland Kitchen aspirin 325 MG tablet Take 325 mg by mouth daily.      Marland Kitchen atorvastatin (LIPITOR) 20 MG tablet Take 20 mg by mouth daily.       . calcium carbonate (OS-CAL) 600 MG TABS tablet Take 600 mg by mouth 2 (two) times daily with a meal.      . diazepam (VALIUM) 5 MG tablet Take 1 tablet (5 mg total) by mouth once.  1 tablet  0  . diphenhydramine-acetaminophen (TYLENOL PM) 25-500 MG TABS Take 1 tablet by mouth at bedtime as needed.      . furosemide (LASIX) 40 MG tablet Take 40 mg by mouth daily.       Marland Kitchen lisinopril (PRINIVIL,ZESTRIL) 20 MG tablet Take 20 mg by mouth daily.      Marland Kitchen LORazepam  (ATIVAN) 0.5 MG tablet Take 0.5 mg by mouth every 8 (eight) hours.       . minoxidil (ROGAINE) 2 % external solution Apply topically 2 (two) times daily.      . Multiple Vitamin (MULTIVITAMIN WITH MINERALS) TABS tablet Take 1 tablet by mouth daily.      Marland Kitchen omeprazole (PRILOSEC) 20 MG capsule       . potassium chloride SA (K-DUR,KLOR-CON) 20 MEQ tablet Take 1 tablet (20 mEq total) by mouth daily.  90 tablet  12  . Vitamin D, Ergocalciferol, (DRISDOL) 50000 UNITS CAPS capsule Take 50,000 Units by mouth.       No current facility-administered medications for this visit.    OBJECTIVE: Older white woman in no acute distress There  were no vitals filed for this visit.   There is no weight on file to calculate BMI.    ECOG FS:1 - Symptomatic but completely ambulatory Vitals - 1 value per visit [id number]  SYSTOLIC 0000000  DIASTOLIC 71  Pulse 79  Temperature 98  Respirations 18  Weight (lb) 156.3  Height 5\' 1"   BMI 29.55  VISIT REPORT    Ocular: Sclerae unicteric, pupils equal, round and reactive to light Ear-nose-throat: Oropharynx clear, dentition fair Lymphatic: No cervical or supraclavicular adenopathy Lungs no rales or rhonchi, good excursion bilaterally Heart regular rate and rhythm, no murmur appreciated Abd soft, nontender, positive bowel sounds MSK no focal spinal tenderness, no joint edema Neuro: non-focal, well-oriented, appropriate affect Breasts: The right breast is status post recent biopsy. There is no skin or nipple change of concern. I do not palpate a mass. The right axilla is benign. The left breast is status post recent biopsy. It is otherwise unremarkable. Left axilla is unremarkable.   LAB RESULTS:  CMP     Component Value Date/Time   NA 140 04/23/2013 1128   K 2.9* 04/23/2013 1128   CO2 24 04/23/2013 1128   GLUCOSE 251* 04/23/2013 1128   BUN 12.8 04/23/2013 1128   CREATININE 1.1 04/23/2013 1128   CALCIUM 9.4 04/23/2013 1128   PROT 6.9 04/23/2013 1128   ALBUMIN  3.6 04/23/2013 1128   AST 28 04/23/2013 1128   ALT 23 04/23/2013 1128   ALKPHOS 100 04/23/2013 1128   BILITOT 0.72 04/23/2013 1128    I No results found for this basename: SPEP,  UPEP,   kappa and lambda light chains    Lab Results  Component Value Date   WBC 4.6 04/23/2013   NEUTROABS 2.7 04/23/2013   HGB 11.6 04/23/2013   HCT 35.7 04/23/2013   MCV 99.8 04/23/2013   PLT 155 04/23/2013      Chemistry      Component Value Date/Time   NA 140 04/23/2013 1128   K 2.9* 04/23/2013 1128   CO2 24 04/23/2013 1128   BUN 12.8 04/23/2013 1128   CREATININE 1.1 04/23/2013 1128      Component Value Date/Time   CALCIUM 9.4 04/23/2013 1128   ALKPHOS 100 04/23/2013 1128   AST 28 04/23/2013 1128   ALT 23 04/23/2013 1128   BILITOT 0.72 04/23/2013 1128       Lab Results  Component Value Date   LABCA2 35 04/23/2013    No components found with this basename: [id number]    No results found for this basename: INR,  in the last 168 hours  Urinalysis No results found for this basename: colorurine,  appearanceur,  labspec,  phurine,  glucoseu,  hgbur,  bilirubinur,  ketonesur,  proteinur,  urobilinogen,  nitrite,  leukocytesur    STUDIES: Mr Breast Bilateral W Wo Contrast  04/06/2013   CLINICAL DATA:  79 year old female with recently diagnosed invasive ductal carcinoma in the right breast at 12 o'clock anterior depth.  EXAM: MR BILATERAL BREAST WITHOUT AND WITH CONTRAST  LABS:  Most recent serum creatinine was 0.8 mg/dL 04/05/2013.  TECHNIQUE: Multiplanar, multisequence MR images of both breasts were obtained prior to and following the intravenous administration of 1ml of MultiHance.  THREE-DIMENSIONAL MR IMAGE RENDERING ON INDEPENDENT WORKSTATION:  Three-dimensional MR images were rendered by post-processing of the original MR data on an independent workstation. The three-dimensional MR images were interpreted, and findings are reported in the following complete MRI report for this  study.  COMPARISON:  Previous  exams  FINDINGS: Breast composition: c:  Heterogeneous fibroglandular tissue  Background parenchymal enhancement: Moderate. Multiple small enhancing foci are present in the breasts bilaterally, which are favored to be benign given the multiplicity and bilaterality.  Right breast: There is biopsy clip artifact in the right breast at 12 o'clock anterior depth at site of known biopsy proven malignancy with a small enhancing mass measuring 0.6 cm AP, 0.4 cm transverse, and 0.8 cm craniocaudal. There is surrounding non mass enhancement demonstrating washout kinetics, with the total extent of the mass and non mass enhancement measuring 1.9 cm AP, 1.5 cm transverse, and 1.1 cm craniocaudal.  Left breast: There is a small enhancing mass in the inferior left breast at 6 o'clock middle depth with mixed kinetics measuring 0.5 cm AP, 0.3 cm transverse, and 0.3 cm craniocaudal. In addition, there is a 2.4 cm area of clumped linear non mass enhancement in the outer left breast also demonstrating mixed kinetics.  Lymph nodes: No definite abnormal appearing axillary or internal mammary lymph nodes. A small right internal mammary lymph node measures 2-3 mm transverse.  Ancillary findings: A nonenhancing high T2 single mass in the left hepatic lobe measuring approximately 1 cm, likely a cyst.  IMPRESSION: 1. Biopsy proven malignancy in the right breast at 12 o'clock anterior depth with the total extent of the small mass and surrounding non mass enhancement measuring up to 1.9 cm. No definite evidence for multifocal/multicentric disease.  2. Indeterminate small enhancing mass in the inferior left breast as well as clumped linear non mass enhancement in the outer left breast.  RECOMMENDATION: 1. Treatment plan for known biopsy proven malignancy in the right breast.  2. Recommend MRI guided biopsy of the small enhancing mass in the inferior left breast as well as the linear clumped non mass enhancement in  the outer left breast.  BI-RADS CATEGORY  4: Suspicious abnormality - biopsy should be considered.   Electronically Signed   By: Everlean Alstrom M.D.   On: 04/06/2013 14:12   Mm Digital Diagnostic Unilat L  04/13/2013   ADDENDUM REPORT: 04/13/2013 12:02  ADDENDUM: Pathology results of the MRI biopsy of the 2 sites in the left breast came back as benign fibrocystic change and fibroadenoma. This is concordant with the imaging findings. The patient is aware of the results. The patient reports soreness and bruising at the biopsy sites, but is otherwise doing okay.  Follow up:  Treatment plan for known malignancy in the right breast.   Electronically Signed   By: Everlean Alstrom M.D.   On: 04/13/2013 12:02   04/13/2013   CLINICAL DATA:  79 year old female recently diagnosed with right breast cancer. Two areas of abnormal enhancement seen in the left breast. The patient is status post MR guided core biopsies.  EXAM: DIGITAL DIAGNOSTIC UNILATERAL LEFT MAMMOGRAM  COMPARISON:  Previous exams  FINDINGS: Mammographic images were obtained following MR guided biopsies of the left breast. There is a cylindrical shaped clip in the 6 o'clock region of the left breast and a bullet-shaped clip in the lateral aspect of the left breast.  IMPRESSION: Status post MR guided core biopsies of the left breast with pathology pending.  Final Assessment: Post Procedure Mammograms for Marker Placement  Electronically Signed: By: Lillia Mountain M.D. On: 04/12/2013 12:33   Mr Aundra Millet Breast Bx Johnella Moloney Dev 1st Lesion Image Bx Spec Mr Guide  04/12/2013   CLINICAL DATA:  79 year old female recently diagnosed with right breast invasive ductal carcinoma. Two areas of  abnormal enhancement seen in the left breast on the patient's MRI.  EXAM: MR BREAST BIOPSY  TECHNIQUE: Multiplanar, multisequence MR imaging of the left breast was performed both before and after administration of intravenous contrast.  THREE-DIMENSIONAL MR IMAGE RENDERING ON INDEPENDENT  WORKSTATION  Three-dimensional MR images were rendered by post-processing of the original MR data on an independent workstation. The three-dimensional MR images were interpreted, and findings were reported in the accompanying complete MRI report for this study.  CONTRAST:  14 cc of MultiHance.  COMPARISON:  Previous exams.  FINDINGS: I met with the patient, and we discussed the procedure of MRI guided biopsy, including risks, benefits, and alternatives. Specifically, we discussed the risks of infection, bleeding, tissue injury, clip migration, and inadequate sampling. Informed, written consent was given.  Using sterile technique, 2% Lidocaine, MRI guidance, and a 9 gauge vacuum assisted device, biopsy was performed of the nodule and the 6 o'clock region of the left breast using a lateral approach. At the conclusion of the biopsy a cylindrical shaped clip was deployed.  IMPRESSION: MRI guided biopsy of the left breast. No apparent complications.   Electronically Signed   By: Lillia Mountain M.D.   On: 04/12/2013 12:26   Mr Aundra Millet Breast Bx W Loc Dev Ea Add Lesion Image Bx Spec Mr Guide  04/12/2013   CLINICAL DATA:  79 year old female recently diagnosed with right breast cancer. Two areas of abnormal enhancement seen in the left breast with MR imaging.  EXAM: RADIOLOGY EXAMINATION  TECHNIQUE: Multiplanar, multisequence MR imaging of the left breast was performed both before and after administration of intravenous contrast.  THREE-DIMENSIONAL MR IMAGE RENDERING ON INDEPENDENT WORKSTATION  Three-dimensional MR images were rendered by post-processing of the original MR data on an independent workstation. The three-dimensional MR images were interpreted, and findings were reported in the accompanying complete MRI report for this study.  CONTRAST:  14 cc of MultiHance  COMPARISON:  Previous exams.  FINDINGS: I met with the patient, and we discussed the procedure of MRI guided biopsy, including risks, benefits, and  alternatives. Specifically, we discussed the risks of infection, bleeding, tissue injury, clip migration, and inadequate sampling. Informed, written consent was given.  Using sterile technique, 2% Lidocaine, MRI guidance, and a 9 gauge vacuum assisted device, biopsy was performed of the lateral aspect of the left breast using a lateral approach. At the conclusion of the biopsy a dumbbell-shaped clip was deployed.  IMPRESSION: MRI guided biopsy of the left Hall. No apparent complications.   Electronically Signed   By: Lillia Mountain M.D.   On: 04/12/2013 12:28    ASSESSMENT: 79 y.o. [place] woman, status post right breast biopsy 03/15/2013 for a clinical T1c N0, stage IA invasive ductal carcinoma, grade 1 or 2, estrogen receptor 100% positive, progesterone receptor 100% positive, with an MIB-1 of 30%, and HER-2 amplified by CISH, with a ratio of 3.51 and an average HER-2 copy number Purcell of 6.85  (1) biopsy of 2 suspicious areas in the left breast both showed no evidence of malignancy  PLAN: We spent the better part of today's hour-long appointment discussing the biology of breast cancer in general, and the specifics of the patient's tumor in particular. She understands we will need her final pathology before we can make definitive decisions, but reviewing her MRI with her my feeling is that we will have a small invasive component and a slightly larger in situ component. Assuming margins are good and she can tolerate antiestrogen therapy, it might be  possible for her to avoid receiving radiation.  The real question is what to do regarding her HER-2 positivity. If the tumor is 1 cm or less, NCCN guidelines suggest endocrine therapy, and consideration of trastuzumab with chemotherapy. If it is a T1 C., then the recommendation is for chemotherapy with trastuzumab followed by endocrine therapy.  Of course we will want to individualize these recommendations. We do have some data, in the metastatic setting,  using anti-HER-2 treatment together with anti-estrogen therapy, in the absence of chemotherapy, and the risk reduction and appears similar to that obtained when chemotherapy is used. Of course trastuzumab is not side effect free, and in an elderly person concerns regarding heart damage need to be considered  With all this in mind, I plan to see Rebecca Hall again in approximately 3 weeks, at which time we will have the final pathology and should be able to make a definitive decision regarding treatment. She has a good understanding of the overall options and is very much in agreement with this plan.   Chauncey Cruel, MD   04/24/2013 10:56 AM

## 2013-04-23 NOTE — Telephone Encounter (Signed)
Rebecca Hall called from [facility] to see if we can order 27-29 Cancer Antigen the morning of surgery.  There was a mix up with appointments earlier today.  Patient scheduled for surgery 04/27/2013.  Please advise if okay to draw labs on morning of surgery.

## 2013-04-23 NOTE — Progress Notes (Signed)
Checked in new patient with no fiancial issues. I gave her appt card and [facility] packet.

## 2013-04-23 NOTE — Progress Notes (Signed)
Abnormal potassium called to dr Donne Hazel

## 2013-04-23 NOTE — Progress Notes (Signed)
Completed chart and placed in MD box.

## 2013-04-24 ENCOUNTER — Encounter: Payer: Self-pay | Admitting: Oncology

## 2013-04-24 LAB — CANCER ANTIGEN 27.29: CA 27.29: 35 U/mL (ref 0–39)

## 2013-04-28 ENCOUNTER — Ambulatory Visit (HOSPITAL_COMMUNITY)
Admission: RE | Admit: 2013-04-28 | Discharge: 2013-04-28 | Disposition: A | Discharge status: Home / Self Care | Payer: Medicare Other | Source: Ambulatory Visit | Attending: General Surgery | Admitting: General Surgery

## 2013-04-28 ENCOUNTER — Ambulatory Visit (HOSPITAL_BASED_OUTPATIENT_CLINIC_OR_DEPARTMENT_OTHER): Payer: Medicare Other | Admitting: Anesthesiology

## 2013-04-28 ENCOUNTER — Encounter (HOSPITAL_BASED_OUTPATIENT_CLINIC_OR_DEPARTMENT_OTHER): Payer: Medicare Other | Admitting: Anesthesiology

## 2013-04-28 ENCOUNTER — Encounter (HOSPITAL_BASED_OUTPATIENT_CLINIC_OR_DEPARTMENT_OTHER)
Admission: RE | Disposition: A | Discharge status: Home / Self Care | Payer: Self-pay | Source: Ambulatory Visit | Attending: General Surgery

## 2013-04-28 ENCOUNTER — Ambulatory Visit (HOSPITAL_BASED_OUTPATIENT_CLINIC_OR_DEPARTMENT_OTHER)
Admission: RE | Admit: 2013-04-28 | Discharge: 2013-04-28 | Disposition: A | Discharge status: Home / Self Care | Payer: Medicare Other | Source: Ambulatory Visit | Attending: General Surgery | Admitting: General Surgery

## 2013-04-28 ENCOUNTER — Encounter (HOSPITAL_BASED_OUTPATIENT_CLINIC_OR_DEPARTMENT_OTHER): Payer: Self-pay | Admitting: *Deleted

## 2013-04-28 DIAGNOSIS — C50411 Malignant neoplasm of upper-outer quadrant of right female breast: Secondary | ICD-10-CM

## 2013-04-28 DIAGNOSIS — D0590 Unspecified type of carcinoma in situ of unspecified breast: Secondary | ICD-10-CM

## 2013-04-28 DIAGNOSIS — E785 Hyperlipidemia, unspecified: Secondary | ICD-10-CM | POA: Insufficient documentation

## 2013-04-28 DIAGNOSIS — N189 Chronic kidney disease, unspecified: Secondary | ICD-10-CM | POA: Insufficient documentation

## 2013-04-28 DIAGNOSIS — M810 Age-related osteoporosis without current pathological fracture: Secondary | ICD-10-CM | POA: Insufficient documentation

## 2013-04-28 DIAGNOSIS — I129 Hypertensive chronic kidney disease with stage 1 through stage 4 chronic kidney disease, or unspecified chronic kidney disease: Secondary | ICD-10-CM | POA: Insufficient documentation

## 2013-04-28 HISTORY — DX: Gastro-esophageal reflux disease without esophagitis: K21.9

## 2013-04-28 HISTORY — DX: Unspecified osteoarthritis, unspecified site: M19.90

## 2013-04-28 HISTORY — DX: Myoneural disorder, unspecified: G70.9

## 2013-04-28 HISTORY — DX: Irritable bowel syndrome, unspecified: K58.9

## 2013-04-28 HISTORY — PX: BREAST LUMPECTOMY WITH NEEDLE LOCALIZATION AND AXILLARY SENTINEL LYMPH NODE BX: SHX5760

## 2013-04-28 HISTORY — DX: Personal history of other diseases of the digestive system: Z87.19

## 2013-04-28 SURGERY — BREAST LUMPECTOMY WITH NEEDLE LOCALIZATION AND AXILLARY SENTINEL LYMPH NODE BX
Anesthesia: General | Site: Breast | Laterality: Right | Wound class: Clean

## 2013-04-28 MED ORDER — SODIUM CHLORIDE 0.9 % IR SOLN
Status: DC | PRN
  Administered 2013-04-28: 1000 mL

## 2013-04-28 MED ORDER — FENTANYL CITRATE 0.05 MG/ML IJ SOLN
INTRAMUSCULAR | Status: AC
  Filled 2013-04-28: qty 2

## 2013-04-28 MED ORDER — BUPIVACAINE HCL (PF) 0.25 % IJ SOLN
INTRAMUSCULAR | Status: DC | PRN
  Administered 2013-04-28: 17 mL

## 2013-04-28 MED ORDER — MIDAZOLAM HCL 2 MG/2ML IJ SOLN
INTRAMUSCULAR | Status: AC
  Filled 2013-04-28: qty 2

## 2013-04-28 MED ORDER — EPHEDRINE SULFATE 50 MG/ML IJ SOLN
INTRAMUSCULAR | Status: DC | PRN
  Administered 2013-04-28 (×4): 10 mg via INTRAVENOUS

## 2013-04-28 MED ORDER — OXYCODONE HCL 5 MG PO TABS: 5.0000 mg | ORAL_TABLET | Freq: Once | ORAL | Status: DC | PRN

## 2013-04-28 MED ORDER — BUPIVACAINE HCL (PF) 0.25 % IJ SOLN
INTRAMUSCULAR | Status: AC
  Filled 2013-04-28: qty 30

## 2013-04-28 MED ORDER — ONDANSETRON HCL 4 MG/2ML IJ SOLN: 4.0000 mg | Freq: Four times a day (QID) | INTRAMUSCULAR | Status: DC | PRN

## 2013-04-28 MED ORDER — PROPOFOL 10 MG/ML IV BOLUS
INTRAVENOUS | Status: DC | PRN
  Administered 2013-04-28: 160 mg via INTRAVENOUS

## 2013-04-28 MED ORDER — FENTANYL CITRATE 0.05 MG/ML IJ SOLN
25.0000 ug | INTRAMUSCULAR | Status: DC | PRN
  Administered 2013-04-28 (×2): 25 ug via INTRAVENOUS

## 2013-04-28 MED ORDER — SODIUM CHLORIDE 0.9 % IJ SOLN
INTRAMUSCULAR | Status: AC
  Filled 2013-04-28: qty 10

## 2013-04-28 MED ORDER — LIDOCAINE HCL (CARDIAC) 20 MG/ML IV SOLN
INTRAVENOUS | Status: DC | PRN
  Administered 2013-04-28: 50 mg via INTRAVENOUS

## 2013-04-28 MED ORDER — CEFAZOLIN SODIUM-DEXTROSE 2-3 GM-% IV SOLR
INTRAVENOUS | Status: AC
  Filled 2013-04-28: qty 50

## 2013-04-28 MED ORDER — HYDROCODONE-ACETAMINOPHEN 5-325 MG PO TABS: 1.0000 | ORAL_TABLET | ORAL | Status: DC | PRN

## 2013-04-28 MED ORDER — MIDAZOLAM HCL 2 MG/2ML IJ SOLN
1.0000 mg | INTRAMUSCULAR | Status: DC | PRN
  Administered 2013-04-28: 1 mg via INTRAVENOUS

## 2013-04-28 MED ORDER — FENTANYL CITRATE 0.05 MG/ML IJ SOLN
50.0000 ug | INTRAMUSCULAR | Status: DC | PRN
  Administered 2013-04-28: 50 ug via INTRAVENOUS

## 2013-04-28 MED ORDER — METHYLENE BLUE 1 % INJ SOLN
INTRAMUSCULAR | Status: AC
  Filled 2013-04-28: qty 10

## 2013-04-28 MED ORDER — FENTANYL CITRATE 0.05 MG/ML IJ SOLN
INTRAMUSCULAR | Status: DC | PRN
  Administered 2013-04-28: 100 ug via INTRAVENOUS

## 2013-04-28 MED ORDER — OXYCODONE HCL 5 MG/5ML PO SOLN: 5.0000 mg | Freq: Once | ORAL | Status: DC | PRN

## 2013-04-28 MED ORDER — CEFAZOLIN SODIUM-DEXTROSE 2-3 GM-% IV SOLR
2.0000 g | INTRAVENOUS | Status: AC
  Administered 2013-04-28: 2 g via INTRAVENOUS

## 2013-04-28 MED ORDER — LACTATED RINGERS IV SOLN
INTRAVENOUS | Status: DC
  Administered 2013-04-28 (×2): via INTRAVENOUS

## 2013-04-28 MED ORDER — MIDAZOLAM HCL 2 MG/ML PO SYRP: 0.5000 mg/kg | ORAL_SOLUTION | Freq: Once | ORAL | Status: DC | PRN

## 2013-04-28 MED ORDER — ONDANSETRON HCL 4 MG/2ML IJ SOLN
INTRAMUSCULAR | Status: DC | PRN
  Administered 2013-04-28: 4 mg via INTRAVENOUS

## 2013-04-28 MED ORDER — TECHNETIUM TC 99M SULFUR COLLOID FILTERED
1.0000 | Freq: Once | INTRAVENOUS | Status: AC | PRN
  Administered 2013-04-28: 1 via INTRADERMAL

## 2013-04-28 MED ORDER — FENTANYL CITRATE 0.05 MG/ML IJ SOLN
INTRAMUSCULAR | Status: AC
  Filled 2013-04-28: qty 4

## 2013-04-28 SURGICAL SUPPLY — 63 items
APPLIER CLIP 9.375 MED OPEN (MISCELLANEOUS) ×2
BENZOIN TINCTURE PRP APPL 2/3 (GAUZE/BANDAGES/DRESSINGS) ×2 IMPLANT
BINDER BREAST LRG (GAUZE/BANDAGES/DRESSINGS) ×2 IMPLANT
BINDER BREAST MEDIUM (GAUZE/BANDAGES/DRESSINGS) IMPLANT
BINDER BREAST XLRG (GAUZE/BANDAGES/DRESSINGS) IMPLANT
BINDER BREAST XXLRG (GAUZE/BANDAGES/DRESSINGS) IMPLANT
BLADE SURG 15 STRL LF DISP TIS (BLADE) ×1 IMPLANT
BLADE SURG 15 STRL SS (BLADE) ×1
BNDG COHESIVE 4X5 TAN STRL (GAUZE/BANDAGES/DRESSINGS) IMPLANT
CANISTER SUCT 1200ML W/VALVE (MISCELLANEOUS) ×2 IMPLANT
CHLORAPREP W/TINT 26ML (MISCELLANEOUS) ×2 IMPLANT
CLIP APPLIE 9.375 MED OPEN (MISCELLANEOUS) ×1 IMPLANT
COVER MAYO STAND STRL (DRAPES) ×2 IMPLANT
COVER PROBE W GEL 5X96 (DRAPES) ×2 IMPLANT
COVER TABLE BACK 60X90 (DRAPES) ×2 IMPLANT
DECANTER SPIKE VIAL GLASS SM (MISCELLANEOUS) ×2 IMPLANT
DERMABOND ADVANCED (GAUZE/BANDAGES/DRESSINGS) ×1
DERMABOND ADVANCED .7 DNX12 (GAUZE/BANDAGES/DRESSINGS) ×1 IMPLANT
DEVICE DUBIN W/COMP PLATE 8390 (MISCELLANEOUS) ×2 IMPLANT
DRAIN CHANNEL 19F RND (DRAIN) IMPLANT
DRAPE LAPAROSCOPIC ABDOMINAL (DRAPES) ×2 IMPLANT
DRAPE U-SHAPE 76X120 STRL (DRAPES) IMPLANT
DRSG TEGADERM 4X4.75 (GAUZE/BANDAGES/DRESSINGS) IMPLANT
ELECT COATED BLADE 2.86 ST (ELECTRODE) ×2 IMPLANT
ELECT REM PT RETURN 9FT ADLT (ELECTROSURGICAL) ×2
ELECTRODE REM PT RTRN 9FT ADLT (ELECTROSURGICAL) ×1 IMPLANT
EVACUATOR SILICONE 100CC (DRAIN) IMPLANT
GAUZE SPONGE 4X4 12PLY STRL LF (GAUZE/BANDAGES/DRESSINGS) IMPLANT
GLOVE BIO SURGEON STRL SZ7 (GLOVE) ×2 IMPLANT
GLOVE BIOGEL PI IND STRL 7.5 (GLOVE) ×2 IMPLANT
GLOVE BIOGEL PI INDICATOR 7.5 (GLOVE) ×2
GLOVE EXAM NITRILE MD LF STRL (GLOVE) ×2 IMPLANT
GLOVE SURG SS PI 7.0 STRL IVOR (GLOVE) ×2 IMPLANT
GOWN PREVENTION PLUS XLARGE (GOWN DISPOSABLE) ×2 IMPLANT
GOWN STRL REIN 2XL LVL4 (GOWN DISPOSABLE) ×2 IMPLANT
KIT MARKER MARGIN INK (KITS) ×2 IMPLANT
NDL SAFETY ECLIPSE 18X1.5 (NEEDLE) IMPLANT
NEEDLE HYPO 18GX1.5 SHARP (NEEDLE)
NEEDLE HYPO 25X1 1.5 SAFETY (NEEDLE) ×2 IMPLANT
NS IRRIG 1000ML POUR BTL (IV SOLUTION) ×2 IMPLANT
PACK BASIN DAY SURGERY FS (CUSTOM PROCEDURE TRAY) ×2 IMPLANT
PENCIL BUTTON HOLSTER BLD 10FT (ELECTRODE) ×2 IMPLANT
PIN SAFETY STERILE (MISCELLANEOUS) IMPLANT
SLEEVE SCD COMPRESS KNEE MED (MISCELLANEOUS) ×2 IMPLANT
SPONGE LAP 18X18 X RAY DECT (DISPOSABLE) IMPLANT
SPONGE LAP 4X18 X RAY DECT (DISPOSABLE) ×2 IMPLANT
STAPLER VISISTAT 35W (STAPLE) ×2 IMPLANT
STOCKINETTE IMPERVIOUS LG (DRAPES) IMPLANT
STRIP CLOSURE SKIN 1/2X4 (GAUZE/BANDAGES/DRESSINGS) ×2 IMPLANT
SUT MNCRL AB 4-0 PS2 18 (SUTURE) ×4 IMPLANT
SUT MON AB 5-0 PS2 18 (SUTURE) IMPLANT
SUT SILK 2 0 SH (SUTURE) IMPLANT
SUT VIC AB 2-0 SH 27 (SUTURE) ×2
SUT VIC AB 2-0 SH 27XBRD (SUTURE) ×2 IMPLANT
SUT VIC AB 3-0 SH 27 (SUTURE) ×2
SUT VIC AB 3-0 SH 27X BRD (SUTURE) ×2 IMPLANT
SUT VIC AB 5-0 PS2 18 (SUTURE) IMPLANT
SUT VICRYL AB 3 0 TIES (SUTURE) IMPLANT
SYR CONTROL 10ML LL (SYRINGE) ×2 IMPLANT
TOWEL OR 17X24 6PK STRL BLUE (TOWEL DISPOSABLE) ×2 IMPLANT
TOWEL OR NON WOVEN STRL DISP B (DISPOSABLE) IMPLANT
TUBE CONNECTING 20X1/4 (TUBING) ×2 IMPLANT
YANKAUER SUCT BULB TIP NO VENT (SUCTIONS) ×2 IMPLANT

## 2013-04-28 NOTE — Interval H&P Note (Signed)
History and Physical Interval Note:  04/28/2013 10:55 AM  Rebecca Hall  has presented today for surgery, with the diagnosis of right breast cancer  The various methods of treatment have been discussed with the patient and family. After consideration of risks, benefits and other options for treatment, the patient has consented to  Procedure(s): BREAST LUMPECTOMY WITH NEEDLE LOCALIZATION AND AXILLARY SENTINEL LYMPH NODE BX (Right) as a surgical intervention .  The patient's history has been reviewed, patient examined, no change in status, stable for surgery.  I have reviewed the patient's chart and labs.  Questions were answered to the patient's satisfaction.     Kora Groom

## 2013-04-28 NOTE — Anesthesia Postprocedure Evaluation (Signed)
Anesthesia Post Note  Patient: Rebecca Hall  Procedure(s) Performed: Procedure(s) (LRB): BREAST LUMPECTOMY WITH NEEDLE LOCALIZATION AND AXILLARY SENTINEL LYMPH NODE BX (Right)  Anesthesia type: General  Patient location: PACU  Post pain: Pain level controlled and Adequate analgesia  Post assessment: Post-op Vital signs reviewed, Patient's Cardiovascular Status Stable, Respiratory Function Stable, Patent Airway and Pain level controlled  Last Vitals:  Filed Vitals:   04/28/13 1224  BP: 132/46  Pulse:   Temp: 36.6 C  Resp:     Post vital signs: Reviewed and stable  Level of consciousness: awake, alert  and oriented  Complications: No apparent anesthesia complications

## 2013-04-28 NOTE — Progress Notes (Signed)
This encounter was created in error - please disregard.

## 2013-04-28 NOTE — Anesthesia Preprocedure Evaluation (Signed)
Anesthesia Evaluation  Patient identified by MRN, date of birth, ID band Patient awake    Reviewed: Allergy & Precautions, H&P , NPO status , Patient's Chart, lab work & pertinent test results  Airway Mallampati: II  Neck ROM: full    Dental   Pulmonary          Cardiovascular hypertension,     Neuro/Psych  Neuromuscular disease    GI/Hepatic GERD-  ,  Endo/Other    Renal/GU      Musculoskeletal   Abdominal   Peds  Hematology   Anesthesia Other Findings   Reproductive/Obstetrics                           Anesthesia Physical Anesthesia Plan  ASA: II  Anesthesia Plan: General   Post-op Pain Management:    Induction: Intravenous  Airway Management Planned: LMA  Additional Equipment:   Intra-op Plan:   Post-operative Plan:   Informed Consent: I have reviewed the patients History and Physical, chart, labs and discussed the procedure including the risks, benefits and alternatives for the proposed anesthesia with the patient or authorized representative who has indicated his/her understanding and acceptance.     Plan Discussed with: CRNA, Anesthesiologist and Surgeon  Anesthesia Plan Comments:         Anesthesia Quick Evaluation

## 2013-04-28 NOTE — Anesthesia Procedure Notes (Signed)
Procedure Name: LMA Insertion Performed by: Tawni Millers Pre-anesthesia Checklist: Patient identified, Emergency Drugs available, Suction available and Patient being monitored Patient Re-evaluated:Patient Re-evaluated prior to inductionOxygen Delivery Method: Circle System Utilized Preoxygenation: Pre-oxygenation with 100% oxygen Intubation Type: IV induction Ventilation: Mask ventilation without difficulty LMA: LMA inserted LMA Size: 3.0 Number of attempts: 1 Airway Equipment and Method: bite block Placement Confirmation: positive ETCO2 Tube secured with: Tape Dental Injury: Teeth and Oropharynx as per pre-operative assessment

## 2013-04-28 NOTE — Op Note (Signed)
Preoperative diagnosis: Clinical stage I right breast cancer, HER-2/neu amplified Postoperative diagnosis: Same as above Procedure: #1 right breast wire-guided partial mastectomy  #2Right axillary sentinel lymph node biopsy Surgeon: Dr Serita Grammes Anesthesia: general Estimated blood loss: Minimal Complications: None Drains: None Specimens: #1 right breast tissue marked with paint #2 additional superior margin marked short superior, long lateral, and double deep #3 axillary sentinel lymph node x2 with counts of 179 and 528 Sponge and count was correct at completion Disposition to recovery room in stable condition  Indications: This 79 year old female who underwent a routine screening mammogram was found to have a right breast mass. This underwent a core biopsy showing are HER-2/neu amplified invasive ductal carcinoma. Due to this we elected to proceed with bilateral breast MRI which showed what ended up being a couple of benign left breast lesions. She has been seen by medical oncology. We have elected to perform a lumpectomy with sentinel lymph node biopsy and await her pathology prior to deciding on any adjuvant therapy.  Procedure: After informed consent was obtained the patient was first taken at the breast center where she had a wire placed. I had these mammograms in the operating room. She was injected with technetium in the standard periareolar fashion. She was placed under general anesthesia without complication. Her right breast and axilla were then prepped and draped in the standard sterile surgical fashion. A surgical timeout was performed.  The lesion looked to be close to the anterior margin on mammography. I did excise a small ellipse of skin and then used cautery to remove the tumor as well as the wire which was brought in from its remote position. This was done with an attempt to get clear margins. I then painted the specimen. I had Dr. Lyndon Code from pathology look at the specimen  grossly to see of my margins were clear. The superior margin looked like there might be some tumor cells that approached it so I reexcised this margin and sent this as a separate specimen. I then obtained hemostasis. I placed 2 clips in the deep portion. I placed one clip in each portion around the cavity. I then closed the breast tissue with a 2-0 Vicryl. I closed the remaining layers with 3-0 Vicryl and 4 Monocryl. I infiltrated Marcaine throughout this wound. I eventually put Dermabond and Steri-Strips over this.  I then located a sentinel node. I made a 2 cm incision the axilla. I carried this through the axillary fascia. I then identified the 2 radioactive nodes and removed them. There was no background radioactivity. I then obtained hemostasis. I closed the axillary fascia with 2-0 Vicryl. The dermis was closed with 3-0 Vicryl the skin with 4 Monocryl. Sterile dressing was then placed overlying this. A breast binder was placed. She tolerated this well was extubated and transferred to the recovery room in stable condition.

## 2013-04-28 NOTE — Transfer of Care (Signed)
Immediate Anesthesia Transfer of Care Note  Patient: Rebecca Hall  Procedure(s) Performed: Procedure(s): BREAST LUMPECTOMY WITH NEEDLE LOCALIZATION AND AXILLARY SENTINEL LYMPH NODE BX (Right)  Patient Location: PACU  Anesthesia Type:General  Level of Consciousness: awake  Airway & Oxygen Therapy: Patient Spontanous Breathing and Patient connected to face mask oxygen  Post-op Assessment: Report given to PACU RN and Post -op Vital signs reviewed and stable  Post vital signs: Reviewed and stable  Complications: No apparent anesthesia complications

## 2013-04-28 NOTE — H&P (View-Only) (Signed)
Patient ID: Rebecca Hall, female   DOB: Mar 09, 1932, 79 y.o.   MRN: [id number]  Chief Complaint  Patient presents with  . New Evaluation    eval new br cancer    HPI Rebecca Hall is a 79 y.o. female.  Referred by Dr Christene Slates HPI 89 yof who has no complaints referable to either breast and presents after undergoing routine screening mm that showed a 4 mm asymmetry in the right breast.  US showed an irregular mass.  US guided biopsy was obtained of the solid mass located in the 12 oclock position anterior depth 5 cm from nipple.  A tissue marker was placed.  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  She comes in with her family to disuss her options.  Past Medical History  Diagnosis Date  . Hyperlipidemia   . Hypertension   . Chronic kidney disease   . Osteoporosis     Past Surgical History  Procedure Laterality Date  . Uterine fibroid surgery      removed utures  . Cataract extraction  2009    both  . Skin cancer excision      head, face, hand ..multiple years    Family History  Problem Relation Age of Onset  . Cancer Maternal Aunt     colon cancer  . Cancer Maternal Uncle     lung  . Cancer Maternal Grandmother     pancreas    Social History History  Substance Use Topics  . Smoking status: Never Smoker   . Smokeless tobacco: Never Used  . Alcohol Use: No    Allergies  Allergen Reactions  . Levaquin [Levofloxacin In D5w] Anaphylaxis and Nausea Only    Shaking real bad on medicaton  . Codeine Nausea And Vomiting    Pass out  . Sulfa Antibiotics Swelling    Current Outpatient Prescriptions  Medication Sig Dispense Refill  . allopurinol (ZYLOPRIM) 100 MG tablet       . allopurinol (ZYLOPRIM) 300 MG tablet Take 300 mg by mouth daily.      Marland Kitchen aspirin 325 MG tablet Take 325 mg by mouth daily.      Marland Kitchen atorvastatin (LIPITOR) 20 MG tablet       . BOOSTRIX 5-2.5-18.5 injection       .  calcium carbonate (OS-CAL) 600 MG TABS tablet Take 600 mg by mouth 2 (two) times daily with a meal.      . diclofenac sodium (VOLTAREN) 1 % GEL Apply 2 g topically.      . diphenhydramine-acetaminophen (TYLENOL PM) 25-500 MG TABS Take 1 tablet by mouth at bedtime as needed.      Marland Kitchen estradiol (CLIMARA - DOSED IN MG/24 HR) 0.05 mg/24hr patch Place 1 patch onto the skin once a week.      . furosemide (LASIX) 40 MG tablet       . lisinopril (PRINIVIL,ZESTRIL) 20 MG tablet Take 20 mg by mouth daily.      Marland Kitchen LORazepam (ATIVAN) 0.5 MG tablet       . minoxidil (ROGAINE) 2 % external solution Apply topically 2 (two) times daily.      . Multiple Vitamin (MULTIVITAMIN WITH MINERALS) TABS tablet Take 1 tablet by mouth daily.      Marland Kitchen omeprazole (PRILOSEC) 20 MG capsule       . predniSONE (DELTASONE) 5 MG tablet       . Vitamin D,  Ergocalciferol, (DRISDOL) 50000 UNITS CAPS capsule Take 50,000 Units by mouth.       No current facility-administered medications for this visit.    Review of Systems Review of Systems  Constitutional: Negative for fever, chills and unexpected weight change.  HENT: Negative for hearing loss, congestion, sore throat, trouble swallowing and voice change.   Eyes: Negative for visual disturbance.  Respiratory: Negative for cough and wheezing.   Cardiovascular: Positive for leg swelling. Negative for chest pain and palpitations.  Gastrointestinal: Positive for diarrhea. Negative for nausea, vomiting, abdominal pain, constipation, blood in stool, abdominal distention and anal bleeding.  Genitourinary: Negative for hematuria, vaginal bleeding and difficulty urinating.  Musculoskeletal: Positive for arthralgias.  Skin: Negative for rash and wound.  Neurological: Positive for headaches. Negative for seizures and syncope.  Hematological: Negative for adenopathy. Does not bruise/bleed easily.  Psychiatric/Behavioral: Negative for confusion.    Blood pressure 120/72, pulse 72,  temperature 98.2 F (36.8 C), temperature source Temporal, resp. rate 14, height 5\' 1"  (1.549 m), weight 157 lb 6.4 oz (71.396 kg).  Physical Exam Physical Exam  Constitutional: She appears well-developed and well-nourished.  Cardiovascular: Normal rate, regular rhythm and normal heart sounds.   Pulmonary/Chest: Effort normal and breath sounds normal. She has no wheezes. She has no rales. Right breast exhibits no inverted nipple, no mass, no nipple discharge, no skin change and no tenderness. Left breast exhibits no inverted nipple, no mass, no nipple discharge, no skin change and no tenderness.  Lymphadenopathy:    She has no cervical adenopathy.    Data Reviewed Mm, [place], path reviewed  Assessment    Right breast cancer, clinical stage I    Plan    Breast MRI followed likely by surgery  We discussed multiple options and decided esp with her2 positivity to pursue mr.  She understands this likely does not help with survival and may require more biopsies.  i think for her this is reasonable though and she would very much like to pursue.  After that if nothing else shows up I think she is a good candidate for lump/sn. We will hold on port as decision for chemo/anti her2 therapy will be based on final pathology We discussed the staging and pathophysiology of breast cancer. We discussed all of the different options for treatment for breast cancer including surgery, chemotherapy, radiation therapy, Herceptin, and antiestrogen therapy.   We discussed a sentinel lymph node biopsy as she does not appear to having lymph node involvement right now. We discussed the performance of that with injection of radioactive tracer and blue dye. We discussed that she would have an incision underneath her axillary hairline. We discussed that there is a chance of having a positive node with a sentinel lymph node biopsy and we will await the permanent pathology to make any other first further decisions in terms of  her treatment. One of these options might be to return to the operating room to perform an axillary lymph node dissection. We discussed about a 1-2% risk lifetime of chronic shoulder pain as well as lymphedema associated with a sentinel lymph node biopsy.  We discussed the options for treatment of the breast cancer which included lumpectomy versus a mastectomy. We discussed the performance of the lumpectomy with a wire placement. We discussed a 10-20% chance of a positive margin requiring reexcision in the operating room. We also discussed that she may need radiation therapy or antiestrogen therapy or both if she undergoes lumpectomy. We discussed the mastectomy and  the postoperative care for that as well. We discussed that there is no difference in her survival whether she undergoes lumpectomy with radiation therapy or antiestrogen therapy versus a mastectomy.  We discussed the risks of operation including bleeding, infection, possible reoperation. She understands her further therapy will be based on what her stages at the time of her operation.         Zriyah Kopplin 03/29/2013, 10:39 PM

## 2013-04-30 ENCOUNTER — Encounter (HOSPITAL_BASED_OUTPATIENT_CLINIC_OR_DEPARTMENT_OTHER): Payer: Self-pay | Admitting: General Surgery

## 2013-04-30 ENCOUNTER — Telehealth (INDEPENDENT_AMBULATORY_CARE_PROVIDER_SITE_OTHER): Payer: Self-pay | Admitting: General Surgery

## 2013-04-30 NOTE — Telephone Encounter (Signed)
Called patient and gave pathology results.

## 2013-05-03 ENCOUNTER — Other Ambulatory Visit: Payer: Self-pay | Admitting: Oncology

## 2013-05-07 ENCOUNTER — Other Ambulatory Visit: Payer: Self-pay | Admitting: *Deleted

## 2013-05-07 ENCOUNTER — Telehealth: Payer: Self-pay | Admitting: *Deleted

## 2013-05-07 ENCOUNTER — Encounter (INDEPENDENT_AMBULATORY_CARE_PROVIDER_SITE_OTHER): Payer: Self-pay

## 2013-05-07 ENCOUNTER — Other Ambulatory Visit: Payer: Self-pay | Admitting: Oncology

## 2013-05-07 ENCOUNTER — Ambulatory Visit (HOSPITAL_BASED_OUTPATIENT_CLINIC_OR_DEPARTMENT_OTHER): Payer: Medicare Other | Admitting: Oncology

## 2013-05-07 VITALS — BP 149/73 | HR 97 | Temp 97.4°F | Resp 18 | Ht 61.0 in | Wt 153.1 lb

## 2013-05-07 DIAGNOSIS — C50919 Malignant neoplasm of unspecified site of unspecified female breast: Secondary | ICD-10-CM

## 2013-05-07 DIAGNOSIS — M8580 Other specified disorders of bone density and structure, unspecified site: Secondary | ICD-10-CM

## 2013-05-07 DIAGNOSIS — M810 Age-related osteoporosis without current pathological fracture: Secondary | ICD-10-CM

## 2013-05-07 DIAGNOSIS — Z170 Estrogen receptor positive status [ER+]: Secondary | ICD-10-CM

## 2013-05-07 MED ORDER — ANASTROZOLE 1 MG PO TABS: 1.0000 mg | ORAL_TABLET | Freq: Every day | ORAL | Status: DC

## 2013-05-07 NOTE — Progress Notes (Signed)
Patient at [facility] to pick up Rx.

## 2013-05-07 NOTE — Telephone Encounter (Signed)
appts made and printed. Pt is aware that i gv Linda the echo order for pre cert and will call her w/ an appt. Pt is aware to call her primary physician to schedule her done density @ [facility]...td

## 2013-05-07 NOTE — Progress Notes (Signed)
ID: [name] OB: [birth year]  MR#: [id number]  [id number]  PCP: Rebecca Finer, MD GYN:   SU: Rebecca Hall OTHER MD: Rebecca Hall, Rebecca Hall,  CHIEF COMPLAINT: "They found I had breast cancer".  HISTORY OF PRESENT ILLNESS: Rebecca Hall had routine screening mammography at [facility] 03/04/2013 showing a possible mass in the right breast, measuring 4 mm in diameter. Additional views 03/10/2013 confirmed an area of asymmetry in the right breast which was hypoechoic by ultrasonography. Biopsy of this area 03/15/2013 showed ([id number] [id number]) and invasive ductal carcinoma, grade 1 or 2, estrogen receptor 100% positive, progesterone receptor 100% positive, with a proliferation marker of 30% and HER-2 amplification by CISH with a ratio of 3.51 and an average HER-2 copy number of 6.85.  On 04/05/2013 the patient underwent bilateral breast MRIs. This showed in the right breast an 8 mm mass at the site of the known biopsy site, with surrounding known masslike enhancement. The entire area of concern measured 1.9 cm. There were no abnormal appearing lymph nodes of concern. In the left breast there were 2 areas of clumped non-masslike enhancement measuring 0.5 cm and 2.4 cm.  The patient's subsequent history is as detailed below  INTERVAL HISTORY: Rebecca Hall returns today for followup of her breast cancer accompanied by her husband Rebecca Hall and her daughter Rebecca Hall. Since her last visit here, the patient underwent right lumpectomy and sentinel lymph node sampling, with the final pathology ([id number]) showing a 1 cm invasive ductal carcinoma, grade 2, with both sentinel lymph nodes negative. Margins were 2 mm or more.  REVIEW OF SYSTEMS: She did well with the surgery, without any unusual pain, fever, bleeding, or other complications. She has mild sinus symptoms. She stopped taking her estrogens but has not developed any problems with hot flashes. A detailed review of systems today was otherwise  noncontributory  PAST MEDICAL HISTORY: Past Medical History  Diagnosis Date  . Hyperlipidemia   . Hypertension   . Chronic kidney disease   . Osteoporosis   . GERD (gastroesophageal reflux disease)   . IBS (irritable bowel syndrome)   . Arthritis   . Neuromuscular disorder     peripheral neuropathy feet  . History of lower GI bleeding     PAST SURGICAL HISTORY: Past Surgical History  Procedure Laterality Date  . Cataract extraction  2009    both  . Skin cancer excision      head, face, hand ..multiple years  . Abdominal hysterectomy  1975    No salpingo-oophorectomy  . Tonsillectomy    . Colonoscopy    . Breast lumpectomy with needle localization and axillary sentinel lymph node bx Right 04/28/2013    Procedure: BREAST LUMPECTOMY WITH NEEDLE LOCALIZATION AND AXILLARY SENTINEL LYMPH NODE BX;  Surgeon: Rebecca Bookbinder, MD;  Location: [facility];  Service: General;  Laterality: Right;    FAMILY HISTORY Family History  Problem Relation Age of Onset  . Cancer Maternal Aunt     colon cancer  . Cancer Maternal Uncle     lung  . Cancer Maternal Grandmother     pancreas   the patient's father died at the age of 55 from a subarachnoid hemorrhage. The patient's mother lived to be and 82.75 years old. She lived independently to be and. The patient had one brother, no sisters. There is a history of colon cancer or in a maternal aunts, diagnosed at age 9, and lung cancer in a maternal uncle diagnosed at age 48. There is no history of  breast or ovarian cancer in the family  GYNECOLOGIC HISTORY:  Rebecca Hall can't remember when she had her first period. First live birth came at age 44 and she is [employer]. She she took hormone replacement, namely estrogen, until the 20 14th when she was diagnosed with breast cancer.  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 Her husband Rebecca Hall is retired, he is largely disabled secondary to back  problems following a fall. Daughter Rebecca Hall is a retired Pharmacist, hospital living in [place]. 80 older son runs a Engineer, civil (consulting) and business in [place] and did be helps out with that. The patient's son Rebecca Hall lives in [place]. He is an Chief Financial Officer. Altogether Rebecca Hall has 4 grandsons, 3 great-grandsons and one great-granddaughter    ADVANCED DIRECTIVES: In place   HEALTH MAINTENANCE: History  Substance Use Topics  . Smoking status: Never Smoker   . Smokeless tobacco: Never Used  . Alcohol Use: No     Colonoscopy: 2011  PAP: Status post hysterectomy  Bone density: Remote (at [facility]  Lipid panel:  Allergies  Allergen Reactions  . Levaquin [Levofloxacin In D5w] Anaphylaxis and Nausea Only    Shaking real bad on medicaton  . Codeine Nausea And Vomiting    Pass out  . Sulfa Antibiotics Swelling    Current Outpatient Prescriptions  Medication Sig Dispense Refill  . allopurinol (ZYLOPRIM) 100 MG tablet Take 100 mg by mouth daily.       Marland Kitchen allopurinol (ZYLOPRIM) 300 MG tablet Take 300 mg by mouth daily.      Marland Kitchen aspirin 325 MG tablet Take 325 mg by mouth daily.      Marland Kitchen atorvastatin (LIPITOR) 20 MG tablet Take 20 mg by mouth daily.       . calcium carbonate (OS-CAL) 600 MG TABS tablet Take 600 mg by mouth 2 (two) times daily with a meal.      . diazepam (VALIUM) 5 MG tablet Take 1 tablet (5 mg total) by mouth once.  1 tablet  0  . diphenhydramine-acetaminophen (TYLENOL PM) 25-500 MG TABS Take 1 tablet by mouth at bedtime as needed.      . furosemide (LASIX) 40 MG tablet Take 40 mg by mouth daily.       Marland Kitchen HYDROcodone-acetaminophen (NORCO/VICODIN) 5-325 MG per tablet Take 1 tablet by mouth every 4 (four) hours as needed.  20 tablet  0  . lisinopril (PRINIVIL,ZESTRIL) 20 MG tablet Take 20 mg by mouth daily.      Marland Kitchen LORazepam (ATIVAN) 0.5 MG tablet Take 0.5 mg by mouth every 8 (eight) hours.       . minoxidil (ROGAINE) 2 % external solution Apply topically 2 (two)  times daily.      . Multiple Vitamin (MULTIVITAMIN WITH MINERALS) TABS tablet Take 1 tablet by mouth daily.      Marland Kitchen omeprazole (PRILOSEC) 20 MG capsule       . potassium chloride SA (K-DUR,KLOR-CON) 20 MEQ tablet Take 1 tablet (20 mEq total) by mouth daily.  90 tablet  12  . Vitamin D, Ergocalciferol, (DRISDOL) 50000 UNITS CAPS capsule Take 50,000 Units by mouth.       No current facility-administered medications for this visit.    OBJECTIVE: Older white woman who appears stated age 25 Vitals:   05/07/13 1120  BP: 149/73  Pulse: 97  Temp: 97.4 F (36.3 C)  Resp: 18     Body mass index is 28.94  kg/(m^2).    ECOG FS:1 - Symptomatic but completely ambulatory  Ocular: Sclerae unicteric, pupils equal, round and reactive to light Ear-nose-throat: Oropharynx clear, dentition fair Lymphatic: No cervical or supraclavicular adenopathy Lungs no rales or rhonchi, good excursion bilaterally Heart regular rate and rhythm, no murmur appreciated Abd soft, nontender, positive bowel sounds MSK no focal spinal tenderness, no joint edema Neuro: non-focal, well-oriented, appropriate affect Breasts: The right breast is status post lumpectomy. The "blue" is still in place. There is no evidence of dehiscence. There is no unusual tenderness, erythema, or swelling. The right axilla is benign. The left breast is unremarkable. Left axilla is benign   LAB RESULTS:  CMP     Component Value Date/Time   NA 140 04/23/2013 1128   K 2.9* 04/23/2013 1128   CO2 24 04/23/2013 1128   GLUCOSE 251* 04/23/2013 1128   BUN 12.8 04/23/2013 1128   CREATININE 1.1 04/23/2013 1128   CALCIUM 9.4 04/23/2013 1128   PROT 6.9 04/23/2013 1128   ALBUMIN 3.6 04/23/2013 1128   AST 28 04/23/2013 1128   ALT 23 04/23/2013 1128   ALKPHOS 100 04/23/2013 1128   BILITOT 0.72 04/23/2013 1128    I No results found for this basename: SPEP,  UPEP,   kappa and lambda light chains    Lab Results  Component Value Date   WBC 4.6  04/23/2013   NEUTROABS 2.7 04/23/2013   HGB 11.6 04/23/2013   HCT 35.7 04/23/2013   MCV 99.8 04/23/2013   PLT 155 04/23/2013      Chemistry      Component Value Date/Time   NA 140 04/23/2013 1128   K 2.9* 04/23/2013 1128   CO2 24 04/23/2013 1128   BUN 12.8 04/23/2013 1128   CREATININE 1.1 04/23/2013 1128      Component Value Date/Time   CALCIUM 9.4 04/23/2013 1128   ALKPHOS 100 04/23/2013 1128   AST 28 04/23/2013 1128   ALT 23 04/23/2013 1128   BILITOT 0.72 04/23/2013 1128       Lab Results  Component Value Date   LABCA2 35 04/23/2013    No components found with this basename: [id number]    No results found for this basename: INR,  in the last 168 hours  Urinalysis No results found for this basename: colorurine,  appearanceur,  labspec,  phurine,  glucoseu,  hgbur,  bilirubinur,  ketonesur,  proteinur,  urobilinogen,  nitrite,  leukocytesur    STUDIES: Nm Sentinel Node Inj-no Rpt (breast)  04/28/2013   CLINICAL DATA: right axillary sentinel node biopsy   Sulfur colloid was injected intradermally by the nuclear medicine  technologist for breast cancer sentinel node localization.    Mm Digital Diagnostic Unilat L  04/13/2013   ADDENDUM REPORT: 04/13/2013 12:02  ADDENDUM: Pathology results of the MRI biopsy of the 2 sites in the left breast came back as benign fibrocystic change and fibroadenoma. This is concordant with the imaging findings. The patient is aware of the results. The patient reports soreness and bruising at the biopsy sites, but is otherwise doing okay.  Follow up:  Treatment plan for known malignancy in the right breast.   Electronically Signed   By: Everlean Alstrom M.D.   On: 04/13/2013 12:02   04/13/2013   CLINICAL DATA:  79 year old female recently diagnosed with right breast cancer. Two areas of abnormal enhancement seen in the left breast. The patient is status post MR guided core biopsies.  EXAM: DIGITAL DIAGNOSTIC UNILATERAL LEFT MAMMOGRAM   COMPARISON:  Previous exams  FINDINGS: Mammographic images were obtained following MR guided biopsies of the left breast. There is a cylindrical shaped clip in the 6 o'clock region of the left breast and a bullet-shaped clip in the lateral aspect of the left breast.  IMPRESSION: Status post MR guided core biopsies of the left breast with pathology pending.  Final Assessment: Post Procedure Mammograms for Marker Placement  Electronically Signed: By: Lillia Mountain M.D. On: 04/12/2013 12:33   Mr Aundra Millet Breast Bx Johnella Moloney Dev 1st Lesion Image Bx Spec Mr Guide  04/12/2013   CLINICAL DATA:  79 year old female recently diagnosed with right breast invasive ductal carcinoma. Two areas of abnormal enhancement seen in the left breast on the patient's MRI.  EXAM: MR BREAST BIOPSY  TECHNIQUE: Multiplanar, multisequence MR imaging of the left breast was performed both before and after administration of intravenous contrast.  THREE-DIMENSIONAL MR IMAGE RENDERING ON INDEPENDENT WORKSTATION  Three-dimensional MR images were rendered by post-processing of the original MR data on an independent workstation. The three-dimensional MR images were interpreted, and findings were reported in the accompanying complete MRI report for this study.  CONTRAST:  14 cc of MultiHance.  COMPARISON:  Previous exams.  FINDINGS: I met with the patient, and we discussed the procedure of MRI guided biopsy, including risks, benefits, and alternatives. Specifically, we discussed the risks of infection, bleeding, tissue injury, clip migration, and inadequate sampling. Informed, written consent was given.  Using sterile technique, 2% Lidocaine, MRI guidance, and a 9 gauge vacuum assisted device, biopsy was performed of the nodule and the 6 o'clock region of the left breast using a lateral approach. At the conclusion of the biopsy a cylindrical shaped clip was deployed.  IMPRESSION: MRI guided biopsy of the left breast. No apparent complications.   Electronically  Signed   By: Lillia Mountain M.D.   On: 04/12/2013 12:26   Mr Aundra Millet Breast Bx W Loc Dev Ea Add Lesion Image Bx Spec Mr Guide  04/12/2013   CLINICAL DATA:  79 year old female recently diagnosed with right breast cancer. Two areas of abnormal enhancement seen in the left breast with MR imaging.  EXAM: RADIOLOGY EXAMINATION  TECHNIQUE: Multiplanar, multisequence MR imaging of the left breast was performed both before and after administration of intravenous contrast.  THREE-DIMENSIONAL MR IMAGE RENDERING ON INDEPENDENT WORKSTATION  Three-dimensional MR images were rendered by post-processing of the original MR data on an independent workstation. The three-dimensional MR images were interpreted, and findings were reported in the accompanying complete MRI report for this study.  CONTRAST:  14 cc of MultiHance  COMPARISON:  Previous exams.  FINDINGS: I met with the patient, and we discussed the procedure of MRI guided biopsy, including risks, benefits, and alternatives. Specifically, we discussed the risks of infection, bleeding, tissue injury, clip migration, and inadequate sampling. Informed, written consent was given.  Using sterile technique, 2% Lidocaine, MRI guidance, and a 9 gauge vacuum assisted device, biopsy was performed of the lateral aspect of the left breast using a lateral approach. At the conclusion of the biopsy a dumbbell-shaped clip was deployed.  IMPRESSION: MRI guided biopsy of the left Hall. No apparent complications.   Electronically Signed   By: Lillia Mountain M.D.   On: 04/12/2013 12:28   ASSESSMENT: 79 y.o. [place] woman, status post right breast biopsy 03/15/2013 for a clinical T1c N0, stage IA invasive ductal carcinoma, grade 1 or 2, estrogen receptor 100% positive, progesterone receptor 100% positive, with an MIB-1 of 30%, and HER-2 amplified  by CISH, with a ratio of 3.51 and an average HER-2 copy number Damita Lack of 6.85  (1) biopsy of 2 suspicious areas in the left breast both showed no  evidence of malignancy  (2) right lumpectomy and sentinel lymph node sampling 04/28/2013 showed a pT1n pN0, stage IA invasive ductal carcinoma, grade 2, with negative margins  PLAN: We spent the better part of today's 45 minute visit discussing treatment options. In general, Staphanie has a good prognosis, and she will definitely benefit from antiestrogen therapy. There is good data in cases like hers that women over 61 can avoid radiation if they take antiestrogen therapy for 5 years, and that was discussed at the multidisciplinary breast cancer conference. The consensus was that the patient could forego radiation without compromising her survival. Accordingly no radiation is planned.  The issue of what to do with the HER-2 positivity is more complex. She understands that the HER-2 receptor, when activated, and bypass the estrogen receptor blockage and stimulate the nucleus to make more cancer cells. Another way to put it is that in HER-2 positive patients anti-estrogens are less effective unless coupled with anti-HER-2 treatment. The problem with anti-HER-2 treatment, of course, is the possibility of weakening the heart muscle.  In addition, the adjuvant data for her to use all include some form of chemotherapy, which we really do not want to use in this case. On the other hand, we do have data and stage IV that trastuzumab, when coupled with anti-estrogens, essentially doubles the effectiveness of antiestrogen. We also have a little bit of data that a full year may not be necessary in all cases (although there may be a marginal disadvantage to have a uterus compared to one year of trastuzumab)  Given all these are surgically and with the understanding that we are "out-of-the-box", my recommendation to her is that we start anastrozole and trastuzumab. She will receive her trastuzumab every 3 weeks for 9 doses. That's essentially one half a year. We will do an echo before she starts and after the first 4  doses of trastuzumab. If she tolerates it well I will arrange for her to receive it in [place] where she usually spends January and February.  The patient is very motivated to proceed as discussed. Accordingly I have scheduled her for an echocardiogram and a bone density. I am sending her prescriptions for anastrozole to her pharmacy. She will see me again the first week in December and at that time hopefully we will be ready to start her HER-2 treatment. She knows to call for any problems that may develop before that visit.  Chauncey Cruel, MD   05/07/2013 11:51 AM

## 2013-05-07 NOTE — Telephone Encounter (Signed)
appt was given to pt per [facility] office notes...td

## 2013-05-10 ENCOUNTER — Telehealth: Payer: Self-pay | Admitting: *Deleted

## 2013-05-10 NOTE — Telephone Encounter (Signed)
sw pt gv appts for 05/13/13 @ 10am...td

## 2013-05-12 ENCOUNTER — Telehealth: Payer: Self-pay | Admitting: *Deleted

## 2013-05-12 ENCOUNTER — Other Ambulatory Visit: Payer: Self-pay | Admitting: Oncology

## 2013-05-12 NOTE — Telephone Encounter (Signed)
Returned call to patient concerning future appts, 05/13/13, echo; 06/10/13, bone density with [facility] on [address]. She will see Dr. Jana Hakim in office on 05/24/13. Patient verbalized understanding. No further concerns.

## 2013-05-13 ENCOUNTER — Ambulatory Visit (HOSPITAL_COMMUNITY)
Admission: RE | Admit: 2013-05-13 | Discharge: 2013-05-13 | Disposition: A | Discharge status: Home / Self Care | Payer: Medicare Other | Source: Ambulatory Visit | Attending: Oncology | Admitting: Oncology

## 2013-05-13 DIAGNOSIS — I129 Hypertensive chronic kidney disease with stage 1 through stage 4 chronic kidney disease, or unspecified chronic kidney disease: Secondary | ICD-10-CM | POA: Insufficient documentation

## 2013-05-13 DIAGNOSIS — M8580 Other specified disorders of bone density and structure, unspecified site: Secondary | ICD-10-CM

## 2013-05-13 DIAGNOSIS — I517 Cardiomegaly: Secondary | ICD-10-CM | POA: Insufficient documentation

## 2013-05-13 DIAGNOSIS — C50919 Malignant neoplasm of unspecified site of unspecified female breast: Secondary | ICD-10-CM | POA: Insufficient documentation

## 2013-05-13 DIAGNOSIS — I079 Rheumatic tricuspid valve disease, unspecified: Secondary | ICD-10-CM | POA: Insufficient documentation

## 2013-05-13 DIAGNOSIS — E785 Hyperlipidemia, unspecified: Secondary | ICD-10-CM | POA: Insufficient documentation

## 2013-05-13 DIAGNOSIS — I359 Nonrheumatic aortic valve disorder, unspecified: Secondary | ICD-10-CM | POA: Insufficient documentation

## 2013-05-13 DIAGNOSIS — N189 Chronic kidney disease, unspecified: Secondary | ICD-10-CM | POA: Insufficient documentation

## 2013-05-13 NOTE — Progress Notes (Signed)
*  PRELIMINARY RESULTS* Echocardiogram 2D Echocardiogram has been performed.  Leavy Cella 05/13/2013, 11:21 AM

## 2013-05-22 ENCOUNTER — Other Ambulatory Visit: Payer: Self-pay | Admitting: Oncology

## 2013-05-24 ENCOUNTER — Other Ambulatory Visit (HOSPITAL_BASED_OUTPATIENT_CLINIC_OR_DEPARTMENT_OTHER): Payer: Medicare Other | Admitting: Lab

## 2013-05-24 ENCOUNTER — Telehealth: Payer: Self-pay | Admitting: Oncology

## 2013-05-24 ENCOUNTER — Ambulatory Visit (HOSPITAL_BASED_OUTPATIENT_CLINIC_OR_DEPARTMENT_OTHER): Payer: Medicare Other | Admitting: Oncology

## 2013-05-24 VITALS — BP 122/66 | HR 90 | Temp 97.6°F | Resp 18 | Ht 61.0 in | Wt 150.0 lb

## 2013-05-24 DIAGNOSIS — G4700 Insomnia, unspecified: Secondary | ICD-10-CM

## 2013-05-24 DIAGNOSIS — M545 Low back pain, unspecified: Secondary | ICD-10-CM

## 2013-05-24 DIAGNOSIS — M899 Disorder of bone, unspecified: Secondary | ICD-10-CM

## 2013-05-24 DIAGNOSIS — Z170 Estrogen receptor positive status [ER+]: Secondary | ICD-10-CM

## 2013-05-24 DIAGNOSIS — C50411 Malignant neoplasm of upper-outer quadrant of right female breast: Secondary | ICD-10-CM

## 2013-05-24 DIAGNOSIS — C50919 Malignant neoplasm of unspecified site of unspecified female breast: Secondary | ICD-10-CM

## 2013-05-24 DIAGNOSIS — R51 Headache: Secondary | ICD-10-CM

## 2013-05-24 NOTE — Progress Notes (Signed)
ID: Rebecca Hall OB: 03/09/1932  MR#: [id number]  [id number]  PCP: Rebecca Finer, MD GYN:   SU: Rebecca Hall OTHER MD: Rebecca Hall, Rebecca Hall,  CHIEF COMPLAINT: "They found I had breast cancer".  HISTORY OF PRESENT ILLNESS: Rebecca Hall had routine screening mammography at [facility] 03/04/2013 showing a possible mass in the right breast, measuring 4 mm in diameter. Additional views 03/10/2013 confirmed an area of asymmetry in the right breast which was hypoechoic by ultrasonography. Biopsy of this area 03/15/2013 showed (SAA [id number]) and invasive ductal carcinoma, grade 1 or 2, estrogen receptor 100% positive, progesterone receptor 100% positive, with a proliferation marker of 30% and HER-2 amplification by CISH with a ratio of 3.51 and an average HER-2 copy number of 6.85.  On 04/05/2013 the patient underwent bilateral breast MRIs. This showed in the right breast an 8 mm mass at the site of the known biopsy site, with surrounding known masslike enhancement. The entire area of concern measured 1.9 cm. There were no abnormal appearing lymph nodes of concern. In the left breast there were 2 areas of clumped non-masslike enhancement measuring 0.5 cm and 2.4 cm.  The patient's subsequent history is as detailed below  INTERVAL HISTORY: Rebecca Hall returns today for followup of her breast cancer accompanied by her husband Rebecca Hall. Since her last visit here she started anastrozole. So far she is tolerating it with no significant hot flashes and no concerns regarding vaginal dryness. She's paying less than $20 for a 90 day supply. She had her echocardiogram, which shows a good ejection fraction, but her DEXA scan had to be postponed until later this month for insurance reasons  REVIEW OF SYSTEMS: She has noted some mild symptoms which likely in my opinion are not related to her anastrozole. She is to get headaches from eating onions. She has had a few mild headaches in the last few weeks. Their  "not bad" and easily controlled with Tylenol. She has had lower back pain for years. This is perhaps a little bit worse. She is now sleeping well. She feels asleep easily, but wakes up 2 or 3 times a night. This is not clearly related to hot flashes or nocturia. Likely it is related to anxiety. Otherwise a detailed review of systems today was noncontributory  PAST MEDICAL HISTORY: Past Medical History  Diagnosis Date  . Hyperlipidemia   . Hypertension   . Chronic kidney disease   . Osteoporosis   . GERD (gastroesophageal reflux disease)   . IBS (irritable bowel syndrome)   . Arthritis   . Neuromuscular disorder     peripheral neuropathy feet  . History of lower GI bleeding     PAST SURGICAL HISTORY: Past Surgical History  Procedure Laterality Date  . Cataract extraction  2009    both  . Skin cancer excision      head, face, hand ..multiple years  . Abdominal hysterectomy  1975    No salpingo-oophorectomy  . Tonsillectomy    . Colonoscopy    . Breast lumpectomy with needle localization and axillary sentinel lymph node bx Right 04/28/2013    Procedure: BREAST LUMPECTOMY WITH NEEDLE LOCALIZATION AND AXILLARY SENTINEL LYMPH NODE BX;  Surgeon: Rebecca Bookbinder, MD;  Location: [facility];  Service: General;  Laterality: Right;    FAMILY HISTORY Family History  Problem Relation Age of Onset  . Cancer Maternal Aunt     colon cancer  . Cancer Maternal Uncle     lung  . Cancer Maternal Grandmother  pancreas   the patient's father died at the age of 34 from a subarachnoid hemorrhage. The patient's mother lived to be and 73.5 years old. She lived independently to be and. The patient had one brother, no sisters. There is a history of colon cancer or in a maternal aunts, diagnosed at age 77, and lung cancer in a maternal uncle diagnosed at age 68. There is no history of breast or ovarian cancer in the family  GYNECOLOGIC HISTORY:  Rebecca Hall can't remember when she had her  first period. First live birth came at age 14 and she is [place] 2. She she took hormone replacement, namely estrogen, until the 20 14th when she was diagnosed with breast cancer.  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 Her husband Rebecca Hall is retired, he is largely disabled secondary to back problems following a fall. Daughter Rebecca Hall is a retired Pharmacist, hospital living in [place]. 36 older son runs a Engineer, civil (consulting) and business in [place] and did be helps out with that. The patient's son Rebecca Hall lives in [place]. He is an Chief Financial Officer. Altogether Rebecca Hall has 4 grandsons, 3 great-grandsons and one great-granddaughter    ADVANCED DIRECTIVES: In place   HEALTH MAINTENANCE: History  Substance Use Topics  . Smoking status: Never Smoker   . Smokeless tobacco: Never Used  . Alcohol Use: No     Colonoscopy: 2011  PAP: Status post hysterectomy  Bone density: Remote (at [facility])  Lipid panel:  Allergies  Allergen Reactions  . Levaquin [Levofloxacin In D5w] Anaphylaxis and Nausea Only    Shaking real bad on medicaton  . Codeine Nausea And Vomiting    Pass out  . Sulfa Antibiotics Swelling    Current Outpatient Prescriptions  Medication Sig Dispense Refill  . allopurinol (ZYLOPRIM) 100 MG tablet Take 100 mg by mouth daily.       Marland Kitchen allopurinol (ZYLOPRIM) 300 MG tablet Take 300 mg by mouth daily.      Marland Kitchen anastrozole (ARIMIDEX) 1 MG tablet Take 1 tablet (1 mg total) by mouth daily.  90 tablet  3  . aspirin 325 MG tablet Take 325 mg by mouth daily.      Marland Kitchen atorvastatin (LIPITOR) 20 MG tablet Take 20 mg by mouth daily.       . calcium carbonate (OS-CAL) 600 MG TABS tablet Take 600 mg by mouth 2 (two) times daily with a meal.      . diazepam (VALIUM) 5 MG tablet Take 1 tablet (5 mg total) by mouth once.  1 tablet  0  . diphenhydramine-acetaminophen (TYLENOL PM) 25-500 MG TABS Take 1 tablet by mouth at bedtime as needed.       . furosemide (LASIX) 40 MG tablet Take 40 mg by mouth daily.       Marland Kitchen HYDROcodone-acetaminophen (NORCO/VICODIN) 5-325 MG per tablet Take 1 tablet by mouth every 4 (four) hours as needed.  20 tablet  0  . lisinopril (PRINIVIL,ZESTRIL) 20 MG tablet Take 20 mg by mouth daily.      Marland Kitchen LORazepam (ATIVAN) 0.5 MG tablet Take 0.5 mg by mouth every 8 (eight) hours.       . minoxidil (ROGAINE) 2 % external solution Apply topically 2 (two) times daily.      . Multiple Vitamin (MULTIVITAMIN WITH MINERALS) TABS tablet Take 1 tablet by mouth daily.      Marland Kitchen omeprazole (PRILOSEC) 20 MG capsule       .  potassium chloride SA (K-DUR,KLOR-CON) 20 MEQ tablet Take 1 tablet (20 mEq total) by mouth daily.  90 tablet  12  . Vitamin D, Ergocalciferol, (DRISDOL) 50000 UNITS CAPS capsule Take 50,000 Units by mouth.       No current facility-administered medications for this visit.    OBJECTIVE: Older white woman w in no acute distress Filed Vitals:   05/24/13 1524  BP: 122/66  Pulse: 90  Temp: 97.6 F (36.4 C)  Resp: 18     Body mass index is 28.36 kg/(m^2).    ECOG FS:1 - Symptomatic but completely ambulatory  Ocular: Sclerae unicteric, pupils equal, round and reactive to light Ear-nose-throat: Oropharynx clear, dentition fair Lymphatic: No cervical or supraclavicular adenopathy Lungs no rales or rhonchi, good excursion bilaterally Heart regular rate and rhythm, no murmur appreciated Abd soft, nontender, positive bowel sounds MSK no focal spinal tenderness, no joint edema Neuro: non-focal, well-oriented, appropriate affect Breasts: The right breast is status post lumpectomy. There is no evidence of local recurrence The right axilla is benign. The left breast is unremarkable.   LAB RESULTS:  CMP     Component Value Date/Time   NA 140 04/23/2013 1128   K 2.9* 04/23/2013 1128   CO2 24 04/23/2013 1128   GLUCOSE 251* 04/23/2013 1128   BUN 12.8 04/23/2013 1128   CREATININE 1.1 04/23/2013 1128   CALCIUM 9.4  04/23/2013 1128   PROT 6.9 04/23/2013 1128   ALBUMIN 3.6 04/23/2013 1128   AST 28 04/23/2013 1128   ALT 23 04/23/2013 1128   ALKPHOS 100 04/23/2013 1128   BILITOT 0.72 04/23/2013 1128    I No results found for this basename: SPEP,  UPEP,   kappa and lambda light chains    Lab Results  Component Value Date   WBC 4.6 04/23/2013   NEUTROABS 2.7 04/23/2013   HGB 11.6 04/23/2013   HCT 35.7 04/23/2013   MCV 99.8 04/23/2013   PLT 155 04/23/2013      Chemistry      Component Value Date/Time   NA 140 04/23/2013 1128   K 2.9* 04/23/2013 1128   CO2 24 04/23/2013 1128   BUN 12.8 04/23/2013 1128   CREATININE 1.1 04/23/2013 1128      Component Value Date/Time   CALCIUM 9.4 04/23/2013 1128   ALKPHOS 100 04/23/2013 1128   AST 28 04/23/2013 1128   ALT 23 04/23/2013 1128   BILITOT 0.72 04/23/2013 1128       Lab Results  Component Value Date   LABCA2 35 04/23/2013    No components found with this basename: LABCA125    No results found for this basename: INR,  in the last 168 hours  Urinalysis No results found for this basename: colorurine,  appearanceur,  labspec,  phurine,  glucoseu,  hgbur,  bilirubinur,  ketonesur,  proteinur,  urobilinogen,  nitrite,  leukocytesur    STUDIES: Transthoracic Echocardiography  Patient: Rebecca Hall, Rebecca Hall MR #: [id number] Study Date: 05/13/2013 Gender: F Age: 79 Height: 154.9cm Weight: 69.5kg BSA: 1.82m^2 Pt. Status: Room:  PERFORMING Ezzard Standing, MD [place] SONOGRAPHER Leavy Cella cc:  ------------------------------------------------------------ LV EF: 55% - 60%  ------------------------------------------------------------ History: PMH: Breast Cancer, Chronic kidney Disease Breast Cancer Risk factors: Hypertension. Dyslipidemia.  ------------------------------------------------------------ Study Conclusions  - Left ventricle: The cavity size was normal. There was mild concentric  hypertrophy. Systolic function was normal. The estimated ejection fraction was in the range of 55% to 60%. Wall motion was normal; there were no  regional wall motion abnormalities. Doppler parameters are consistent with abnormal left ventricular relaxation (grade 1 diastolic dysfunction).  ASSESSMENT: 79 y.o.  woman, status post right breast biopsy 03/15/2013 for a clinical T1c N0, stage IA invasive ductal carcinoma, grade 1 or 2, estrogen receptor 100% positive, progesterone receptor 100% positive, with an MIB-1 of 30%, and HER-2 amplified by CISH, with a ratio of 3.51 and an average HER-2 copy number Rebecca Hall of 6.85  (1) biopsy of 2 suspicious areas in the left breast both showed no evidence of malignancy  (2) right lumpectomy and sentinel lymph node sampling 04/28/2013 showed a pT1b pN0, stage IA invasive ductal carcinoma, grade 2, with negative margins  (3) trastuzumab to be started 05/31/2013; initial echocardiogram 05/13/2013 shows a normal ejection fraction  (4) anastrozole started 05/08/2013  (5) osteopenia with T -2.1 on  dexa scan at [facility] 06/10/2011; repeat scan pending later this month  PLAN:  Breahna is tolerating the anastrozole so far without major complications. I think the symptoms she reports (mild headaches, a little more low back pain, insomnia) are not going to be related to this medication. We will review her DEXA scan when it is repeated 06/10/2013 as scheduled, and consider some form of a bisphosphonate, most likely is zolendronate, depending on results.  We discussed port placement and she would like to receive her trastuzumab through a peripheral vein. That should not be a problem. Her first dose will be December 8, which means her third dose will be January 19. Shortly after that, we will repeat an echocardiogram, and assuming all remains well she will be going to [place] where she will receive her next 2 treatments (they usually stay there then  month of February).  The plan at this point is to do the anastrozole for 5 years and the trastuzumab for 9 doses, with echocardiogram after every third dose. Lavenia is in agreement with this plan. She knows to call for any problems that may develop before her next visit here.   Chauncey Cruel, MD   05/24/2013 3:37 PM

## 2013-05-25 ENCOUNTER — Other Ambulatory Visit: Payer: Self-pay | Admitting: Oncology

## 2013-05-25 ENCOUNTER — Ambulatory Visit (INDEPENDENT_AMBULATORY_CARE_PROVIDER_SITE_OTHER): Payer: Medicare Other | Admitting: General Surgery

## 2013-05-25 ENCOUNTER — Encounter (INDEPENDENT_AMBULATORY_CARE_PROVIDER_SITE_OTHER): Payer: Self-pay | Admitting: General Surgery

## 2013-05-25 ENCOUNTER — Telehealth: Payer: Self-pay | Admitting: *Deleted

## 2013-05-25 ENCOUNTER — Telehealth: Payer: Self-pay | Admitting: Oncology

## 2013-05-25 VITALS — BP 126/74 | HR 81 | Temp 98.0°F | Resp 18 | Ht 64.0 in | Wt 151.0 lb

## 2013-05-25 DIAGNOSIS — Z09 Encounter for follow-up examination after completed treatment for conditions other than malignant neoplasm: Secondary | ICD-10-CM

## 2013-05-25 NOTE — Telephone Encounter (Signed)
Per staff message and POF I have scheduled appts.  JMW  

## 2013-05-25 NOTE — Progress Notes (Signed)
Subjective:     Patient ID: Rebecca Hall, female   DOB: 11-21-1931, 79 y.o.   MRN: [id number]  HPI This is a 79 year old female who underwent a right breast lumpectomy and sentinel node biopsy on November 6. She returns today for followup. She has no real complaints at all. Her pathology shows grade II invasive ductal carcinoma, spanning 1 cm, intermediate grade dcis, margins negative.  There is no cancer in 2 sentinel lymph nodes.    Review of Systems     Objective:   Physical Exam Right axillary and right breast incisions healing well without infection    Assessment:     Stage I right breast cancer     Plan:     She has completed surgery and does not need anything further surgically. She will see Dr Jana Hakim and will discuss radiation therapy appt as well.  I can see back in one year.I gave her instructions on how to go [facility] class and encouraged her to do so.

## 2013-05-25 NOTE — Telephone Encounter (Signed)
Per staff message I have adjusted 12/29 appt

## 2013-05-25 NOTE — Telephone Encounter (Signed)
, °

## 2013-05-25 NOTE — Patient Instructions (Signed)
      ABC CLASS After Breast Cancer Class  After Breast Cancer Class is a specially designed exercise class to assist you in a safe recovery after having breast cancer surgery.  In this class you will learn how to get back to full function whether your drains were just removed or if you had surgery a month ago.  This one-time class is held the 1st and 3rd Monday of every month from 11:00 a.m. until 12:00 noon at the [facility] located at [facility].  This class is FREE and space is limited.  For more information or to register for the next available class, call [phone].  Class Goals   Understand specific stretches to improve the flexibility of your chest and shoulder.   Learn ways to safely strengthen your upper body and improve your posture.   Understand the warning signs of infection and why you may be at risk for an arm infection.   Learn about Lymphedema and prevention.  **You do not attend this class until after surgery.  Drains must be removed to participate.     Serafina Royals, PT, CLT Leone Payor, PT, CLT

## 2013-05-28 ENCOUNTER — Other Ambulatory Visit: Payer: Self-pay | Admitting: Oncology

## 2013-05-28 DIAGNOSIS — C50919 Malignant neoplasm of unspecified site of unspecified female breast: Secondary | ICD-10-CM

## 2013-05-29 ENCOUNTER — Other Ambulatory Visit: Payer: Self-pay | Admitting: Oncology

## 2013-05-31 ENCOUNTER — Other Ambulatory Visit: Payer: Self-pay | Admitting: *Deleted

## 2013-05-31 ENCOUNTER — Other Ambulatory Visit (HOSPITAL_BASED_OUTPATIENT_CLINIC_OR_DEPARTMENT_OTHER): Payer: Medicare Other | Admitting: Lab

## 2013-05-31 ENCOUNTER — Ambulatory Visit (HOSPITAL_BASED_OUTPATIENT_CLINIC_OR_DEPARTMENT_OTHER): Payer: Medicare Other

## 2013-05-31 VITALS — BP 96/58 | HR 77 | Temp 97.2°F

## 2013-05-31 DIAGNOSIS — C50411 Malignant neoplasm of upper-outer quadrant of right female breast: Secondary | ICD-10-CM

## 2013-05-31 DIAGNOSIS — C50919 Malignant neoplasm of unspecified site of unspecified female breast: Secondary | ICD-10-CM

## 2013-05-31 DIAGNOSIS — Z5112 Encounter for antineoplastic immunotherapy: Secondary | ICD-10-CM

## 2013-05-31 LAB — COMPREHENSIVE METABOLIC PANEL (CC13)
ALT: 23 U/L (ref 0–55)
AST: 25 U/L (ref 5–34)
Albumin: 4.1 g/dL (ref 3.5–5.0)
Alkaline Phosphatase: 113 U/L (ref 40–150)
Anion Gap: 11 mEq/L (ref 3–11)
BUN: 50.5 mg/dL — ABNORMAL HIGH (ref 7.0–26.0)
CO2: 17 mEq/L — ABNORMAL LOW (ref 22–29)
Calcium: 10 mg/dL (ref 8.4–10.4)
Chloride: 108 mEq/L (ref 98–109)
Creatinine: 1.8 mg/dL — ABNORMAL HIGH (ref 0.6–1.1)
Glucose: 165 mg/dl — ABNORMAL HIGH (ref 70–140)
Potassium: 4.8 mEq/L (ref 3.5–5.1)
Sodium: 136 mEq/L (ref 136–145)
Total Bilirubin: 0.38 mg/dL (ref 0.20–1.20)
Total Protein: 7.5 g/dL (ref 6.4–8.3)

## 2013-05-31 LAB — CBC WITH DIFFERENTIAL/PLATELET
BASO%: 0.3 % (ref 0.0–2.0)
Basophils Absolute: 0 10*3/uL (ref 0.0–0.1)
EOS%: 4.5 % (ref 0.0–7.0)
Eosinophils Absolute: 0.3 10*3/uL (ref 0.0–0.5)
HCT: 35.4 % (ref 34.8–46.6)
HGB: 11.8 g/dL (ref 11.6–15.9)
LYMPH%: 39.9 % (ref 14.0–49.7)
MCH: 32.3 pg (ref 25.1–34.0)
MCHC: 33.3 g/dL (ref 31.5–36.0)
MCV: 97 fL (ref 79.5–101.0)
MONO#: 0.5 10*3/uL (ref 0.1–0.9)
MONO%: 7.2 % (ref 0.0–14.0)
NEUT#: 3.1 10*3/uL (ref 1.5–6.5)
NEUT%: 48.1 % (ref 38.4–76.8)
Platelets: 122 10*3/uL — ABNORMAL LOW (ref 145–400)
RBC: 3.65 10*6/uL — ABNORMAL LOW (ref 3.70–5.45)
RDW: 13.6 % (ref 11.2–14.5)
WBC: 6.4 10*3/uL (ref 3.9–10.3)
lymph#: 2.6 10*3/uL (ref 0.9–3.3)
nRBC: 0 % (ref 0–0)

## 2013-05-31 MED ORDER — ACETAMINOPHEN 325 MG PO TABS
650.0000 mg | ORAL_TABLET | Freq: Once | ORAL | Status: AC
  Administered 2013-05-31: 650 mg via ORAL

## 2013-05-31 MED ORDER — ACETAMINOPHEN 325 MG PO TABS
ORAL_TABLET | ORAL | Status: AC
  Filled 2013-05-31: qty 2

## 2013-05-31 MED ORDER — DIPHENHYDRAMINE HCL 25 MG PO CAPS
ORAL_CAPSULE | ORAL | Status: AC
  Filled 2013-05-31: qty 1

## 2013-05-31 MED ORDER — DIPHENHYDRAMINE HCL 25 MG PO CAPS
25.0000 mg | ORAL_CAPSULE | Freq: Once | ORAL | Status: AC
  Administered 2013-05-31: 25 mg via ORAL

## 2013-05-31 MED ORDER — SODIUM CHLORIDE 0.9 % IV SOLN
Freq: Once | INTRAVENOUS | Status: AC
  Administered 2013-05-31: 14:00:00 via INTRAVENOUS

## 2013-05-31 MED ORDER — TRASTUZUMAB CHEMO INJECTION 440 MG
8.0000 mg/kg | Freq: Once | INTRAVENOUS | Status: AC
  Administered 2013-05-31: 546 mg via INTRAVENOUS
  Filled 2013-05-31: qty 26

## 2013-05-31 NOTE — Patient Instructions (Signed)
[facility] Discharge Instructions for Patients Receiving Chemotherapy  Today you received the following chemotherapy agents Herceptin.  To help prevent nausea and vomiting after your treatment, we encourage you to take your nausea medication as prescribed.   If you develop nausea and vomiting that is not controlled by your nausea medication, call the clinic.   BELOW ARE SYMPTOMS THAT SHOULD BE REPORTED IMMEDIATELY:  *FEVER GREATER THAN 100.5 F  *CHILLS WITH OR WITHOUT FEVER  NAUSEA AND VOMITING THAT IS NOT CONTROLLED WITH YOUR NAUSEA MEDICATION  *UNUSUAL SHORTNESS OF BREATH  *UNUSUAL BRUISING OR BLEEDING  TENDERNESS IN MOUTH AND THROAT WITH OR WITHOUT PRESENCE OF ULCERS  *URINARY PROBLEMS  *BOWEL PROBLEMS  UNUSUAL RASH Items with * indicate a potential emergency and should be followed up as soon as possible.  Feel free to call the clinic you have any questions or concerns. The clinic phone number is [phone].    

## 2013-06-01 ENCOUNTER — Telehealth: Payer: Self-pay | Admitting: *Deleted

## 2013-06-01 NOTE — Telephone Encounter (Signed)
Reports no adverse effect from chemo. Has no questions or concerns. IV site unremarkable.

## 2013-06-11 ENCOUNTER — Encounter: Payer: Self-pay | Admitting: Radiation Oncology

## 2013-06-11 DIAGNOSIS — C50919 Malignant neoplasm of unspecified site of unspecified female breast: Secondary | ICD-10-CM | POA: Insufficient documentation

## 2013-06-11 NOTE — Progress Notes (Signed)
Location of Breast Cancer: right 12 o'clock  Histology per Pathology Report:  03/15/13 Diagnosis Breast, right, needle core biopsy, mass, 12 o'clock, 5 cm f/n - INVASIVE DUCTAL CARCINOMA. - SEE COMMENT.  04/28/13 Diagnosis 1. Breast, lumpectomy, Right - INVASIVE DUCTAL CARCINOMA, GRADE II/III, SPANNING 1.0 CM. - DUCTAL CARCINOMA IN SITU, INTERMEDIATE GRADE. - THE SURGICAL RESECTION MARGINS ARE NEGATIVE FOR CARCINOMA. - SEE ONCOLOGY TABLE BELOW. 2. Lymph node, sentinel, biopsy, Right axillary #1 - THERE IS NO EVIDENCE OF CARCINOMA IN 1 OF 1 LYMPH NODE (0/1). 3. Lymph node, sentinel, biopsy, Right axillary #2 - THERE IS NO EVIDENCE OF CARCINOMA IN 1 OF 1 LYMPH NODE (0/1). 4. Breast, excision, Right additional superior margin - FIBROCYSTIC CHANGES. - THERE IS NO EVIDENCE OF MALIGNANCY. - SEE COMMENT.  Receptor Status: ER(100%), PR (100%), Her2-neu (-)  Did patient present with symptoms (if so, please note symptoms) or was this found on screening mammography?: screening mammogram  Past/Anticipated interventions by surgeon, if any: right lumpectomy, additional margin excision, lymph node sampling  Past/Anticipated interventions by medical oncology, if any: Chemotherapy - trastuzumab  started 05/31/2013; initial echocardiogram 05/13/2013 shows a normal ejection fraction,  anastrozole started 05/08/2013  Lymphedema issues, if any:     Pain issues, if any:    SAFETY ISSUES:  Prior radiation? no  Pacemaker/ICD? no  Possible current pregnancy? no  Is the patient on methotrexate? 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

## 2013-06-14 ENCOUNTER — Encounter: Payer: Self-pay | Admitting: Radiation Oncology

## 2013-06-14 ENCOUNTER — Telehealth: Payer: Self-pay | Admitting: *Deleted

## 2013-06-14 NOTE — Progress Notes (Signed)
Chart note: I reviewed the patient's pathology, and in view of her age I feel that it would be most appropriate to treat her with antiestrogen therapy without radiation therapy. I spoke with the patient and she is elated that she will not need to see me. I shared my thoughts with Dr. Jana Hakim.

## 2013-06-14 NOTE — Telephone Encounter (Signed)
Pt called with several concerns - 1. She will be leaving for [place] post 07/12/2013 infusion until early March.     " I will need my herceptin on 2/9 and then early March before we leave to return home ".   Pt will be staying in the [place] area of [place].  2. " I didn't think I needed radiation therapy but I was called today with reminder that I am scheduled for visit tomorrow with radiation oncology ".  3. Pt has intermittant occurrences of diarrhea which she has mild history of but now with increased and longer bouts. Currently her bowels are normal.  This note will be reviewed with MD for appropriate answers and call returned to pt at [phone].

## 2013-06-15 ENCOUNTER — Ambulatory Visit: Payer: Medicare Other

## 2013-06-15 ENCOUNTER — Ambulatory Visit
Admission: RE | Admit: 2013-06-15 | Discharge: 2013-06-15 | Disposition: A | Discharge status: Home / Self Care | Payer: Medicare Other | Source: Ambulatory Visit | Attending: Radiation Oncology | Admitting: Radiation Oncology

## 2013-06-15 ENCOUNTER — Telehealth: Payer: Self-pay | Admitting: *Deleted

## 2013-06-15 HISTORY — DX: Malignant neoplasm of unspecified site of unspecified female breast: C50.919

## 2013-06-15 NOTE — Telephone Encounter (Signed)
Called patient to inform that she doesn't need this appt. for Today, per Dr. Valere Dross, spoke with patient and she is aware of this.

## 2013-06-21 ENCOUNTER — Encounter: Payer: Self-pay | Admitting: Physician Assistant

## 2013-06-21 ENCOUNTER — Ambulatory Visit (HOSPITAL_BASED_OUTPATIENT_CLINIC_OR_DEPARTMENT_OTHER): Payer: Medicare Other

## 2013-06-21 ENCOUNTER — Telehealth: Payer: Self-pay | Admitting: Oncology

## 2013-06-21 ENCOUNTER — Other Ambulatory Visit (HOSPITAL_BASED_OUTPATIENT_CLINIC_OR_DEPARTMENT_OTHER): Payer: Medicare Other

## 2013-06-21 ENCOUNTER — Telehealth: Payer: Self-pay | Admitting: *Deleted

## 2013-06-21 ENCOUNTER — Ambulatory Visit (HOSPITAL_BASED_OUTPATIENT_CLINIC_OR_DEPARTMENT_OTHER): Payer: Medicare Other | Admitting: Physician Assistant

## 2013-06-21 VITALS — BP 113/65 | HR 80 | Temp 98.3°F | Resp 20 | Ht 64.0 in | Wt 148.3 lb

## 2013-06-21 DIAGNOSIS — Z170 Estrogen receptor positive status [ER+]: Secondary | ICD-10-CM

## 2013-06-21 DIAGNOSIS — E876 Hypokalemia: Secondary | ICD-10-CM

## 2013-06-21 DIAGNOSIS — C50919 Malignant neoplasm of unspecified site of unspecified female breast: Secondary | ICD-10-CM

## 2013-06-21 DIAGNOSIS — G4700 Insomnia, unspecified: Secondary | ICD-10-CM | POA: Insufficient documentation

## 2013-06-21 DIAGNOSIS — N951 Menopausal and female climacteric states: Secondary | ICD-10-CM

## 2013-06-21 DIAGNOSIS — Z5112 Encounter for antineoplastic immunotherapy: Secondary | ICD-10-CM

## 2013-06-21 DIAGNOSIS — T451X5A Adverse effect of antineoplastic and immunosuppressive drugs, initial encounter: Secondary | ICD-10-CM

## 2013-06-21 DIAGNOSIS — R51 Headache: Secondary | ICD-10-CM

## 2013-06-21 DIAGNOSIS — R519 Headache, unspecified: Secondary | ICD-10-CM | POA: Insufficient documentation

## 2013-06-21 DIAGNOSIS — C50411 Malignant neoplasm of upper-outer quadrant of right female breast: Secondary | ICD-10-CM

## 2013-06-21 DIAGNOSIS — R232 Flushing: Secondary | ICD-10-CM | POA: Insufficient documentation

## 2013-06-21 DIAGNOSIS — M899 Disorder of bone, unspecified: Secondary | ICD-10-CM

## 2013-06-21 DIAGNOSIS — M8580 Other specified disorders of bone density and structure, unspecified site: Secondary | ICD-10-CM | POA: Insufficient documentation

## 2013-06-21 HISTORY — DX: Flushing: T45.1X5A

## 2013-06-21 HISTORY — DX: Adverse effect of antineoplastic and immunosuppressive drugs, initial encounter: R23.2

## 2013-06-21 LAB — COMPREHENSIVE METABOLIC PANEL (CC13)
ALT: 21 U/L (ref 0–55)
AST: 21 U/L (ref 5–34)
Albumin: 4 g/dL (ref 3.5–5.0)
Alkaline Phosphatase: 142 U/L (ref 40–150)
Anion Gap: 15 mEq/L — ABNORMAL HIGH (ref 3–11)
BUN: 89 mg/dL — ABNORMAL HIGH (ref 7.0–26.0)
CO2: 15 mEq/L — ABNORMAL LOW (ref 22–29)
Calcium: 9.6 mg/dL (ref 8.4–10.4)
Chloride: 108 mEq/L (ref 98–109)
Creatinine: 2.6 mg/dL — ABNORMAL HIGH (ref 0.6–1.1)
Glucose: 140 mg/dl (ref 70–140)
Potassium: 5 mEq/L (ref 3.5–5.1)
Sodium: 137 mEq/L (ref 136–145)
Total Bilirubin: 0.3 mg/dL (ref 0.20–1.20)
Total Protein: 8 g/dL (ref 6.4–8.3)

## 2013-06-21 LAB — CBC WITH DIFFERENTIAL/PLATELET
BASO%: 0.4 % (ref 0.0–2.0)
Basophils Absolute: 0 10*3/uL (ref 0.0–0.1)
EOS%: 3.7 % (ref 0.0–7.0)
Eosinophils Absolute: 0.3 10*3/uL (ref 0.0–0.5)
HCT: 30.7 % — ABNORMAL LOW (ref 34.8–46.6)
HGB: 10.3 g/dL — ABNORMAL LOW (ref 11.6–15.9)
LYMPH%: 32 % (ref 14.0–49.7)
MCH: 32.2 pg (ref 25.1–34.0)
MCHC: 33.6 g/dL (ref 31.5–36.0)
MCV: 95.9 fL (ref 79.5–101.0)
MONO#: 0.5 10*3/uL (ref 0.1–0.9)
MONO%: 6.6 % (ref 0.0–14.0)
NEUT#: 4.3 10*3/uL (ref 1.5–6.5)
NEUT%: 57.3 % (ref 38.4–76.8)
Platelets: 145 10*3/uL (ref 145–400)
RBC: 3.2 10*6/uL — ABNORMAL LOW (ref 3.70–5.45)
RDW: 14.1 % (ref 11.2–14.5)
WBC: 7.5 10*3/uL (ref 3.9–10.3)
lymph#: 2.4 10*3/uL (ref 0.9–3.3)

## 2013-06-21 MED ORDER — DIPHENHYDRAMINE HCL 25 MG PO CAPS
25.0000 mg | ORAL_CAPSULE | Freq: Once | ORAL | Status: AC
  Administered 2013-06-21: 25 mg via ORAL

## 2013-06-21 MED ORDER — TRASTUZUMAB CHEMO INJECTION 440 MG
6.0000 mg/kg | Freq: Once | INTRAVENOUS | Status: AC
  Administered 2013-06-21: 399 mg via INTRAVENOUS
  Filled 2013-06-21: qty 19

## 2013-06-21 MED ORDER — ACETAMINOPHEN 325 MG PO TABS
ORAL_TABLET | ORAL | Status: AC
  Filled 2013-06-21: qty 2

## 2013-06-21 MED ORDER — GABAPENTIN 100 MG PO CAPS: 200.0000 mg | ORAL_CAPSULE | Freq: Every day | ORAL | Status: DC

## 2013-06-21 MED ORDER — SODIUM CHLORIDE 0.9 % IV SOLN
Freq: Once | INTRAVENOUS | Status: AC
  Administered 2013-06-21: 16:00:00 via INTRAVENOUS

## 2013-06-21 MED ORDER — ACETAMINOPHEN 325 MG PO TABS
650.0000 mg | ORAL_TABLET | Freq: Once | ORAL | Status: AC
  Administered 2013-06-21: 650 mg via ORAL

## 2013-06-21 MED ORDER — DIPHENHYDRAMINE HCL 25 MG PO CAPS
ORAL_CAPSULE | ORAL | Status: AC
  Filled 2013-06-21: qty 1

## 2013-06-21 NOTE — Patient Instructions (Signed)
[facility] Discharge Instructions for Patients Receiving Chemotherapy  Today you received the following chemotherapy agents :  Herceptin  To help prevent nausea and vomiting after your treatment, we encourage you to take your nausea medication as needed.   If you develop nausea and vomiting that is not controlled by your nausea medication, call the clinic.   BELOW ARE SYMPTOMS THAT SHOULD BE REPORTED IMMEDIATELY:  *FEVER GREATER THAN 100.5 F  *CHILLS WITH OR WITHOUT FEVER  NAUSEA AND VOMITING THAT IS NOT CONTROLLED WITH YOUR NAUSEA MEDICATION  *UNUSUAL SHORTNESS OF BREATH  *UNUSUAL BRUISING OR BLEEDING  TENDERNESS IN MOUTH AND THROAT WITH OR WITHOUT PRESENCE OF ULCERS  *URINARY PROBLEMS  *BOWEL PROBLEMS  UNUSUAL RASH Items with * indicate a potential emergency and should be followed up as soon as possible.  Feel free to call the clinic you have any questions or concerns. The clinic phone number is [phone].    

## 2013-06-21 NOTE — Progress Notes (Signed)
We have received a copy of Rebecca Hall's bone density report from [facility], obtained 06/10/2013. This continues to show osteopenia, with a T score of -1.9. This is slightly worsened as compared to -1.5 in 2012.  These results will be reviewed with Rebecca Hall and her next appointment here, and we will discuss the possibility of bisphosphonate therapy.  Micah Flesher, PA-C 06/21/2013

## 2013-06-21 NOTE — Progress Notes (Signed)
ID: Rebecca Hall OB: [birth year]  MR#: [id number]  [id number]  PCP: Rebecca Finer, MD GYN:   SU: Rebecca Hall OTHER MD: Rebecca Hall, Rebecca Hall,  CHIEF COMPLAINT:  Right Breast Cancer   HISTORY OF PRESENT ILLNESS: Rebecca Hall had routine screening mammography at [facility] 03/04/2013 showing a possible mass in the right breast, measuring 4 mm in diameter. Additional views 03/10/2013 confirmed an area of asymmetry in the right breast which was hypoechoic by ultrasonography. Biopsy of this area 03/15/2013 showed ([id number] [id number]) and invasive ductal carcinoma, grade 1 or 2, estrogen receptor 100% positive, progesterone receptor 100% positive, with a proliferation marker of 30% and HER-2 amplification by CISH with a ratio of 3.51 and an average HER-2 copy number of 6.85.  On 04/05/2013 the patient underwent bilateral breast MRIs. This showed in the right breast an 8 mm mass at the site of the known biopsy site, with surrounding known masslike enhancement. The entire area of concern measured 1.9 cm. There were no abnormal appearing lymph nodes of concern. In the left breast there were 2 areas of clumped non-masslike enhancement measuring 0.5 cm and 2.4 cm.  The patient's subsequent history is as detailed below  INTERVAL HISTORY: Rebecca Hall returns today accompanied by her husband Rebecca Hall for followup of her right  breast cancer . She continues on anastrozole which she started 05/08/2013. She's also receiving trastuzumab every 3 weeks, and is due for her second of 9 planned doses today.   Rebecca Hall tells me she doesn't feel as good as she would like to. She's having more hot flashes since being on the anastrozole, and these are particularly problematic at night. She constantly alternates between being too hot and too cold. This is affecting her sleep, and she is subsequently having increased fatigue.  Rebecca Hall also notes that she has had more headaches recently, although she describes these as "not too  bad". She denies any significant change in vision. She does feel dizzy at times, and feels like her "balance is bad". She feels a little weaker than usual. She had one episode of nausea and emesis last week on 06/18/2013, but that has not recurred. She also has a history of orthostatic hypotension and admits that she "blacked out" a couple of weeks ago after having an episode of diarrhea. She knows that she was dehydrated, she tells me, and has tried to hydrate herself more thoroughly since that time. She still has some slight dizziness when she first stands up, but has had no additional loss of consciousness. She denies any injuries, and did not hit her head. She does have some chronic back pain which is perhaps a little worse.  REVIEW OF SYSTEMS: Otherwise, Rebecca Hall denies any recent fevers or illnesses. She's had no rashes or skin changes. She denies any abnormal bruising or bleeding. Her appetite is reduced. She is currently having no problems with nausea, diarrhea, or constipation. She's had no change in urinary habits. She She has noted somehas an occasional dry cough, and has some shortness of breath with exertion, both of which are stable. She denies any orthopnea. She's had no chest pain or palpitations. She has some arthritis which causes joint pain in the knees. She occasionally has some cramps in her legs, and has a history of hypokalemia for which she is an oral supplement. As noted above, she has chronic back pain. She's had no peripheral swelling, and denies any peripheral neuropathy. She does feel anxious at times.   A detailed review of systems is otherwise  stable and noncontributory.   PAST MEDICAL HISTORY: Past Medical History  Diagnosis Date  . Hyperlipidemia   . Hypertension   . Chronic kidney disease   . Osteoporosis   . GERD (gastroesophageal reflux disease)   . IBS (irritable bowel syndrome)   . Arthritis   . Neuromuscular disorder     peripheral neuropathy feet  . History of  lower GI bleeding   . Breast cancer 03/15/13    right, 12 o'clock    PAST SURGICAL HISTORY: Past Surgical History  Procedure Laterality Date  . Cataract extraction  2009    both  . Skin cancer excision      head, face, hand ..multiple years  . Abdominal hysterectomy  1975    No salpingo-oophorectomy  . Tonsillectomy    . Colonoscopy    . Breast lumpectomy with needle localization and axillary sentinel lymph node bx Right 04/28/2013    Procedure: BREAST LUMPECTOMY WITH NEEDLE LOCALIZATION AND AXILLARY SENTINEL LYMPH NODE BX;  Surgeon: Rebecca Bookbinder, MD;  Location: [facility];  Service: General;  Laterality: Right;    FAMILY HISTORY Family History  Problem Relation Age of Onset  . Cancer Maternal Aunt 80    colon cancer  . Cancer Maternal Uncle 65    lung  . Cancer Maternal Grandmother     pancreas   the patient's father died at the age of 59 from a subarachnoid hemorrhage. The patient's mother lived to be and 24.80 years old. She lived independently to be and. The patient had one brother, no sisters. There is a history of colon cancer or in a maternal aunts, diagnosed at age 81, and lung cancer in a maternal uncle diagnosed at age 43. There is no history of breast or ovarian cancer in the family  GYNECOLOGIC HISTORY:  (updated December 2000 410)  Rebecca Hall can't remember when she had her first period. First live birth came at age 18 and she is Rebecca Hall 2. She she took hormone replacement, namely estrogen, until 2014 when she was diagnosed with breast cancer.  SOCIAL HISTORY:  (Updated December 2014. 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 Her husband Rebecca Hall is retired, he is largely disabled secondary to back problems following a fall. Daughter Rebecca Hall is a retired Pharmacist, hospital living in [place]. Rebecca Hall's older son runs a Engineer, civil (consulting) and business in [place] and Rebecca Hall helps out with that. The patient's son Rebecca Hall lives in  [place]. He is an Chief Financial Officer. Altogether Rebecca Hall has 4 grandsons, 3 great-grandsons and one great-granddaughter    ADVANCED DIRECTIVES: In place   HEALTH MAINTENANCE:  (Updated December 2014) History  Substance Use Topics  . Smoking status: Never Smoker   . Smokeless tobacco: Never Used  . Alcohol Use: No     Colonoscopy: 2011  PAP: Status post hysterectomy  Bone density:Dec 2014, [facility] (Copy has been   requested)  Lipid panel: Not on file/Dr. Maxwell Caul   Allergies  Allergen Reactions  . Levaquin [Levofloxacin In D5w] Anaphylaxis and Nausea Only    Shaking real bad on medicaton  . Codeine Nausea And Vomiting    Pass out  . Sulfa Antibiotics Swelling    Current Outpatient Prescriptions  Medication Sig Dispense Refill  . allopurinol (ZYLOPRIM) 100 MG tablet Take 100 mg by mouth daily.       Marland Kitchen allopurinol (ZYLOPRIM) 300 MG tablet Take 300 mg by mouth daily.      Marland Kitchen anastrozole (  ARIMIDEX) 1 MG tablet Take 1 tablet (1 mg total) by mouth daily.  90 tablet  3  . aspirin 325 MG tablet Take 325 mg by mouth daily.      Marland Kitchen atorvastatin (LIPITOR) 20 MG tablet Take 20 mg by mouth daily.       . calcium carbonate (OS-CAL) 600 MG TABS tablet Take 600 mg by mouth 2 (two) times daily with a meal.      . diphenhydramine-acetaminophen (TYLENOL PM) 25-500 MG TABS Take 1 tablet by mouth at bedtime as needed.      . furosemide (LASIX) 40 MG tablet Take 40 mg by mouth daily.       Marland Kitchen lisinopril (PRINIVIL,ZESTRIL) 20 MG tablet Take 20 mg by mouth daily.      Marland Kitchen LORazepam (ATIVAN) 0.5 MG tablet Take 0.5 mg by mouth every 8 (eight) hours.       . minoxidil (ROGAINE) 2 % external solution Apply topically 2 (two) times daily.      . Multiple Vitamin (MULTIVITAMIN WITH MINERALS) TABS tablet Take 1 tablet by mouth daily.      Marland Kitchen omeprazole (PRILOSEC) 20 MG capsule       . potassium chloride SA (K-DUR,KLOR-CON) 20 MEQ tablet Take 1 tablet (20 mEq total) by mouth daily.  90 tablet  12  . Vitamin  D, Ergocalciferol, (DRISDOL) 50000 UNITS CAPS capsule Take 50,000 Units by mouth.      . diazepam (VALIUM) 5 MG tablet Take 1 tablet (5 mg total) by mouth once.  1 tablet  0  . gabapentin (NEURONTIN) 100 MG capsule Take 2 capsules (200 mg total) by mouth at bedtime.  60 capsule  2  . HYDROcodone-acetaminophen (NORCO/VICODIN) 5-325 MG per tablet Take 1 tablet by mouth every 4 (four) hours as needed.  20 tablet  0   No current facility-administered medications for this visit.    OBJECTIVE: Older white woman in no acute distress Filed Vitals:   06/21/13 1423  BP: 113/65  Pulse: 80  Temp: 98.3 F (36.8 C)  Resp: 20     Body mass index is 25.44 kg/(m^2).    ECOG FS:1 - Symptomatic but completely ambulatory Filed Weights   06/21/13 1423  Weight: 148 lb 4.8 oz (67.268 kg)   Physical Exam: HEENT:  Sclerae anicteric.  Oropharynx clear and moist. Neck is supple NODES:  No cervical or supraclavicular lymphadenopathy palpated.  BREAST EXAM:  Deferred. Axillae are benign bilaterally with no palpable lymphadenopathy. LUNGS:  Clear to auscultation bilaterally with good excursion.  No wheezes or rhonchi HEART:  Regular rate and rhythm. No murmur appreciated. ABDOMEN:  Soft, nontender.  Positive bowel sounds.  MSK:  No focal spinal tenderness to palpation, including the lower back where the patient complains of chronic pain. Good range of motion bilaterally in the upper extremities. No joint swelling. EXTREMITIES:  No peripheral edema.   SKIN:  Benign.  No obvious rashes or skin lesions. NEURO:  Nonfocal. Cranial nerves grossly intact. Well oriented.  Appropriate affect.   LAB RESULTS:   Lab Results  Component Value Date   WBC 7.5 06/21/2013   NEUTROABS 4.3 06/21/2013   HGB 10.3* 06/21/2013   HCT 30.7* 06/21/2013   MCV 95.9 06/21/2013   PLT 145 06/21/2013      Chemistry      Component Value Date/Time   NA 136 05/31/2013 1308   K 4.8 05/31/2013 1308   CO2 17* 05/31/2013 1308   BUN  50.5* 05/31/2013 1308  CREATININE 1.8* 05/31/2013 1308      Component Value Date/Time   CALCIUM 10.0 05/31/2013 1308   ALKPHOS 113 05/31/2013 1308   AST 25 05/31/2013 1308   ALT 23 05/31/2013 1308   BILITOT 0.38 05/31/2013 1308       STUDIES:  Most recent echocardiogram on 05/13/2013 showed an ejection fraction of 55-60%.  Patient tells me she had a bone density test at [facility] (Dr. Nancee Liter office) in December 2014. She tells me it showed "stable osteopenia". We are requesting a copy of that study to be faxed to our office.   ASSESSMENT: 79 y.o. [place] woman, status post right breast biopsy 03/15/2013 for a clinical T1c N0, stage IA invasive ductal carcinoma, grade 1 or 2, estrogen receptor 100% positive, progesterone receptor 100% positive, with an MIB-1 of 30%, and HER-2 amplified by CISH, with a ratio of 3.51 and an average HER-2 copy number Damita Lack of 6.85  (1) biopsy of 2 suspicious areas in the left breast both showed no evidence of malignancy  (2) right lumpectomy and sentinel lymph node sampling 04/28/2013 showed a pT1b pN0, stage IA invasive ductal carcinoma, grade 2, with negative margins  (3) trastuzumab to be started 05/31/2013, to be given every 3 weeks for total of 9 doses; initial echocardiogram 05/13/2013 shows a normal ejection fraction  (4) anastrozole started 05/08/2013  (5) osteopenia with T -2.1 on  dexa scan at [facility] 06/10/2011; repeated in December 2014, report pending.  (6)  Will not need radiation therapy.   PLAN: Over half of our 40 minute appointment today was spent reviewing all of Rebecca Hall's questions and concerns, reviewing her treatment plan, and coordinating care.   She'll continue on the anastrozole which she seems to be tolerating well, and the plan is to continue that therapy for a total of 5 years. With regards to the hot flashes and insomnia, we're going to try a low dose of gabapentin, 200 mg at bedtime. I think this will help with the  hot flashes, subsequently allowing her to sleep better night and perhaps be less fatigued during the day. I also encouraged her to exercise on a regular basis which will increase her energy level overall.  We discussed the possibility of obtaining a brain MRI for further evaluation of her headaches, "bad balance", and dizziness. She currently declines an MRI, but agrees to call us if any of these symptoms worsen, or if she develops any new complaints. She also continue to keep herself well hydrated, and will follow her blood pressure closely. She will let us know should she have any additional episodes of "blacking out".  At that point, I certainly believe that further assessment would be necessary.  With regards to her hypokalemia, Rebecca Hall admits that she often does not take her prescribed dose of potassium due to the fact that the pills are "so big". We're checking her potassium level today, and based on those results I will dose her potassium supplement accordingly. We are going to prescribe Micro-K since those tablets are much smaller. Hopefully that will keep her cramping under control as well.  She'll return in 3 weeks for her next dose of trastuzumab, and I will see her that day on January 19. We hope to get a repeat echocardiogram on either January 20 or January 21. There hoping to travel to [place] in late January, where they will likely stay until March. If that is the case, she will find an oncology center covered under her insurance and will  receive trastuzumab in [place]. Per Dr. Virgie Dad plan, we hope to complete a total of 9 doses of trastuzumab, repeating her echocardiogram after every 3 doses.  We are awaiting the results of her recent bone density scan, and once those results are reviewed, we will decide whether or not to pursue some form of bisphosphonates therapy, most likely zoledronic acid. We would request that she have a thorough dental evaluation prior to that infusion.  All this was  reviewed in detail with Rebecca Hall today, and she was given all of the above information in writing. She voices understanding and agreement with this plan, and will call with any changes or problems prior to her next appointment.    Rebecca Rennaker, PA-C   06/21/2013 3:24 PM

## 2013-06-21 NOTE — Telephone Encounter (Signed)
Per staff message and POF I have scheduled appts.  JMW  

## 2013-06-21 NOTE — Progress Notes (Signed)
Per Micah Flesher PA, patient to hold potassium pills, d/t potassium level @ 5.0 till further instructions. Patient gave verbal understanding. Denies questions at this time.

## 2013-06-22 ENCOUNTER — Encounter: Payer: Self-pay | Admitting: Oncology

## 2013-06-28 ENCOUNTER — Other Ambulatory Visit: Payer: Self-pay | Admitting: Oncology

## 2013-07-02 ENCOUNTER — Telehealth: Payer: Self-pay | Admitting: Oncology

## 2013-07-02 ENCOUNTER — Other Ambulatory Visit: Payer: Self-pay | Admitting: *Deleted

## 2013-07-06 ENCOUNTER — Other Ambulatory Visit: Payer: Self-pay | Admitting: *Deleted

## 2013-07-07 ENCOUNTER — Ambulatory Visit (HOSPITAL_COMMUNITY): Payer: Medicare Other

## 2013-07-08 ENCOUNTER — Ambulatory Visit: Payer: Medicare Other | Admitting: Physician Assistant

## 2013-07-08 ENCOUNTER — Other Ambulatory Visit: Payer: Medicare Other

## 2013-07-08 ENCOUNTER — Ambulatory Visit: Payer: Medicare Other | Admitting: Oncology

## 2013-07-12 ENCOUNTER — Other Ambulatory Visit (HOSPITAL_BASED_OUTPATIENT_CLINIC_OR_DEPARTMENT_OTHER): Payer: Medicare Other

## 2013-07-12 ENCOUNTER — Ambulatory Visit (HOSPITAL_BASED_OUTPATIENT_CLINIC_OR_DEPARTMENT_OTHER): Payer: Medicare Other

## 2013-07-12 ENCOUNTER — Encounter: Payer: Self-pay | Admitting: Physician Assistant

## 2013-07-12 ENCOUNTER — Telehealth: Payer: Self-pay | Admitting: Oncology

## 2013-07-12 ENCOUNTER — Ambulatory Visit (HOSPITAL_BASED_OUTPATIENT_CLINIC_OR_DEPARTMENT_OTHER): Payer: Medicare Other | Admitting: Physician Assistant

## 2013-07-12 ENCOUNTER — Telehealth: Payer: Self-pay | Admitting: *Deleted

## 2013-07-12 VITALS — BP 137/77 | HR 78 | Temp 97.8°F | Resp 18 | Ht 64.0 in | Wt 156.9 lb

## 2013-07-12 DIAGNOSIS — B379 Candidiasis, unspecified: Secondary | ICD-10-CM

## 2013-07-12 DIAGNOSIS — C50919 Malignant neoplasm of unspecified site of unspecified female breast: Secondary | ICD-10-CM

## 2013-07-12 DIAGNOSIS — E876 Hypokalemia: Secondary | ICD-10-CM

## 2013-07-12 DIAGNOSIS — C50411 Malignant neoplasm of upper-outer quadrant of right female breast: Secondary | ICD-10-CM

## 2013-07-12 DIAGNOSIS — G4700 Insomnia, unspecified: Secondary | ICD-10-CM

## 2013-07-12 DIAGNOSIS — L2089 Other atopic dermatitis: Secondary | ICD-10-CM

## 2013-07-12 DIAGNOSIS — L209 Atopic dermatitis, unspecified: Secondary | ICD-10-CM

## 2013-07-12 DIAGNOSIS — Z5112 Encounter for antineoplastic immunotherapy: Secondary | ICD-10-CM

## 2013-07-12 LAB — CBC WITH DIFFERENTIAL/PLATELET
BASO%: 0.6 % (ref 0.0–2.0)
Basophils Absolute: 0 10*3/uL (ref 0.0–0.1)
EOS%: 6.3 % (ref 0.0–7.0)
Eosinophils Absolute: 0.3 10*3/uL (ref 0.0–0.5)
HCT: 32.7 % — ABNORMAL LOW (ref 34.8–46.6)
HGB: 10.3 g/dL — ABNORMAL LOW (ref 11.6–15.9)
LYMPH%: 36.3 % (ref 14.0–49.7)
MCH: 31.8 pg (ref 25.1–34.0)
MCHC: 31.5 g/dL (ref 31.5–36.0)
MCV: 100.9 fL (ref 79.5–101.0)
MONO#: 0.4 10*3/uL (ref 0.1–0.9)
MONO%: 7.9 % (ref 0.0–14.0)
NEUT#: 2.3 10*3/uL (ref 1.5–6.5)
NEUT%: 48.9 % (ref 38.4–76.8)
Platelets: 149 10*3/uL (ref 145–400)
RBC: 3.24 10*6/uL — ABNORMAL LOW (ref 3.70–5.45)
RDW: 16.1 % — ABNORMAL HIGH (ref 11.2–14.5)
WBC: 4.8 10*3/uL (ref 3.9–10.3)
lymph#: 1.7 10*3/uL (ref 0.9–3.3)

## 2013-07-12 LAB — COMPREHENSIVE METABOLIC PANEL (CC13)
ALT: 31 U/L (ref 0–55)
AST: 26 U/L (ref 5–34)
Albumin: 3.7 g/dL (ref 3.5–5.0)
Alkaline Phosphatase: 104 U/L (ref 40–150)
Anion Gap: 12 mEq/L — ABNORMAL HIGH (ref 3–11)
BUN: 19.2 mg/dL (ref 7.0–26.0)
CO2: 25 mEq/L (ref 22–29)
Calcium: 9.5 mg/dL (ref 8.4–10.4)
Chloride: 103 mEq/L (ref 98–109)
Creatinine: 1.1 mg/dL (ref 0.6–1.1)
Glucose: 149 mg/dl — ABNORMAL HIGH (ref 70–140)
Potassium: 4.2 mEq/L (ref 3.5–5.1)
Sodium: 141 mEq/L (ref 136–145)
Total Bilirubin: 0.46 mg/dL (ref 0.20–1.20)
Total Protein: 6.9 g/dL (ref 6.4–8.3)

## 2013-07-12 MED ORDER — DIPHENHYDRAMINE HCL 25 MG PO CAPS
ORAL_CAPSULE | ORAL | Status: AC
  Filled 2013-07-12: qty 1

## 2013-07-12 MED ORDER — DIPHENHYDRAMINE HCL 25 MG PO CAPS
25.0000 mg | ORAL_CAPSULE | Freq: Once | ORAL | Status: AC
  Administered 2013-07-12: 25 mg via ORAL

## 2013-07-12 MED ORDER — ACETAMINOPHEN 325 MG PO TABS
650.0000 mg | ORAL_TABLET | Freq: Once | ORAL | Status: AC
  Administered 2013-07-12: 650 mg via ORAL

## 2013-07-12 MED ORDER — ACETAMINOPHEN 325 MG PO TABS
ORAL_TABLET | ORAL | Status: AC
  Filled 2013-07-12: qty 2

## 2013-07-12 MED ORDER — GABAPENTIN 300 MG PO CAPS: 300.0000 mg | ORAL_CAPSULE | Freq: Every day | ORAL | Status: DC

## 2013-07-12 MED ORDER — LORAZEPAM 0.5 MG PO TABS: 0.5000 mg | ORAL_TABLET | Freq: Two times a day (BID) | ORAL | Status: DC | PRN

## 2013-07-12 MED ORDER — METHYLPREDNISOLONE (PAK) 4 MG PO TABS: ORAL_TABLET | ORAL | Status: DC

## 2013-07-12 MED ORDER — SODIUM CHLORIDE 0.9 % IV SOLN
Freq: Once | INTRAVENOUS | Status: AC
  Administered 2013-07-12: 14:00:00 via INTRAVENOUS

## 2013-07-12 MED ORDER — TRASTUZUMAB CHEMO INJECTION 440 MG
6.0000 mg/kg | Freq: Once | INTRAVENOUS | Status: AC
  Administered 2013-07-12: 399 mg via INTRAVENOUS
  Filled 2013-07-12: qty 19

## 2013-07-12 NOTE — Patient Instructions (Signed)
[facility] Discharge Instructions for Patients Receiving Chemotherapy  Today you received the following chemotherapy agents :  Herceptin  To help prevent nausea and vomiting after your treatment, we encourage you to take your nausea medication as needed.   If you develop nausea and vomiting that is not controlled by your nausea medication, call the clinic.   BELOW ARE SYMPTOMS THAT SHOULD BE REPORTED IMMEDIATELY:  *FEVER GREATER THAN 100.5 F  *CHILLS WITH OR WITHOUT FEVER  NAUSEA AND VOMITING THAT IS NOT CONTROLLED WITH YOUR NAUSEA MEDICATION  *UNUSUAL SHORTNESS OF BREATH  *UNUSUAL BRUISING OR BLEEDING  TENDERNESS IN MOUTH AND THROAT WITH OR WITHOUT PRESENCE OF ULCERS  *URINARY PROBLEMS  *BOWEL PROBLEMS  UNUSUAL RASH Items with * indicate a potential emergency and should be followed up as soon as possible.  Feel free to call the clinic you have any questions or concerns. The clinic phone number is [phone].    

## 2013-07-12 NOTE — Telephone Encounter (Signed)
, °

## 2013-07-12 NOTE — Progress Notes (Signed)
ID: [name] OB: 10/22/1931  MR#: [id number]  CSN#:[id number]  PCP: Horton Finer, MD GYN:   SU: Rolm Bookbinder OTHER MD: Floyde Parkins, Earle Gell,  CHIEF COMPLAINT:  Right Breast Cancer   HISTORY OF PRESENT ILLNESS: Dia had routine screening mammography at [facility] 03/04/2013 showing a possible mass in the right breast, measuring 4 mm in diameter. Additional views 03/10/2013 confirmed an area of asymmetry in the right breast which was hypoechoic by ultrasonography. Biopsy of this area 03/15/2013 showed ([id number]) and invasive ductal carcinoma, grade 1 or 2, estrogen receptor 100% positive, progesterone receptor 100% positive, with a proliferation marker of 30% and HER-2 amplification by CISH with a ratio of 3.51 and an average HER-2 copy number of 6.85.  On 04/05/2013 the patient underwent bilateral breast MRIs. This showed in the right breast an 8 mm mass at the site of the known biopsy site, with surrounding known masslike enhancement. The entire area of concern measured 1.9 cm. There were no abnormal appearing lymph nodes of concern. In the left breast there were 2 areas of clumped non-masslike enhancement measuring 0.5 cm and 2.4 cm.  The patient's subsequent history is as detailed below  INTERVAL HISTORY: Chizuko returns today accompanied by her daughter for followup of her right  breast cancer . She continues on anastrozole which she started 05/08/2013. She's also receiving trastuzumab every 3 weeks, and is due for her 3rd of 9 planned doses today.   Tonie had multiple concerns today discussed today. Her biggest concern is a rash she has developed. Last week, she had a red itchy rash on her ankle which is now almost resolved. She also has a dark red rash on her inner thighs, not particularly itchy, but bothersome. During this discussion, she also reveals that she has had a diffuse red itchy rash on her trunk for "a few weeks now". She's not sure if this started soon after  beginning the anastrozole, or after starting the trastuzumab. Some days it is a little better than others, but has never completely resolved.  Quinetta continues to have "terrible hot flashes with chills" at night. She's tolerating the 200 mg dose of gabapentin well, but has had minimal relief. She does have some insomnia and anxiety for which she takes lorazepam. She does requests a refill on that medication today as well.   REVIEW OF SYSTEMS: Seylah denies any recent fevers or illnesses. Her energy level is low. She denies any abnormal bruising or bleeding. Her appetite is reduced, but fortunately she is having no problems with nausea or emesis. She does have intermittent diarrhea which is not unusual for her. She's had no blood or mucus in the stool, and the diarrhea is stable. She has a history of IBS.   She's had no change in urinary habits. She has a runny nose, and some sinus congestion. She has some shortness of breath with exertion which is stable. She currently denies any cough, phlegm production, orthopnea, peripheral swelling, chest pain, or palpitations. She has chronic back pain and joint pain, also stable, and not worsened since starting the anastrozole. She has some occasional headaches, but no significant change in vision and no recent problems with dizziness.  A detailed review of systems is otherwise stable and noncontributory.   PAST MEDICAL HISTORY: Past Medical History  Diagnosis Date  . Hyperlipidemia   . Hypertension   . Chronic kidney disease   . Osteoporosis   . GERD (gastroesophageal reflux disease)   . IBS (irritable bowel  syndrome)   . Arthritis   . Neuromuscular disorder     peripheral neuropathy feet  . History of lower GI bleeding   . Breast cancer 03/15/13    right, 12 o'clock    PAST SURGICAL HISTORY: Past Surgical History  Procedure Laterality Date  . Cataract extraction  2009    both  . Skin cancer excision      head, face, hand ..multiple years  .  Abdominal hysterectomy  1975    No salpingo-oophorectomy  . Tonsillectomy    . Colonoscopy    . Breast lumpectomy with needle localization and axillary sentinel lymph node bx Right 04/28/2013    Procedure: BREAST LUMPECTOMY WITH NEEDLE LOCALIZATION AND AXILLARY SENTINEL LYMPH NODE BX;  Surgeon: Rolm Bookbinder, MD;  Location: [facility];  Service: General;  Laterality: Right;    FAMILY HISTORY Family History  Problem Relation Age of Onset  . Cancer Maternal Aunt 80    colon cancer  . Cancer Maternal Uncle 65    lung  . Cancer Maternal Grandmother     pancreas   the patient's father died at the age of 68 from a subarachnoid hemorrhage. The patient's mother lived to be and 25.45 years old. She lived independently to be and. The patient had one brother, no sisters. There is a history of colon cancer or in a maternal aunts, diagnosed at age 21, and lung cancer in a maternal uncle diagnosed at age 26. There is no history of breast or ovarian cancer in the family  GYNECOLOGIC HISTORY:  (updated December 2000 410)  Rickey can't remember when she had her first period. First live birth came at age 74 and she is [place]. She she took hormone replacement, namely estrogen, until 2014 when she was diagnosed with breast cancer.  SOCIAL HISTORY:  (Updated December 2014. 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 Her husband [name] is retired, he is largely disabled secondary to back problems following a fall. Daughter Preslynn Bier is a retired Pharmacist, hospital living in [place]. [name] older son runs a Engineer, civil (consulting) and business in [place] and [name] helps out with that. The patient's son [name] lives in [place]. He is an Chief Financial Officer. Altogether Bethani has 4 grandsons, 3 great-grandsons and one great-granddaughter    ADVANCED DIRECTIVES: In place   HEALTH MAINTENANCE:  (Updated January 2015) History  Substance Use Topics  . Smoking  status: Never Smoker   . Smokeless tobacco: Never Used  . Alcohol Use: No     Colonoscopy: 2011  PAP: Status post hysterectomy  Bone density:Dec 2014, [facility], osteopenia with T   score of -1.9  Lipid panel: Not on file/Dr. Maxwell Caul   Allergies  Allergen Reactions  . Levaquin [Levofloxacin In D5w] Anaphylaxis and Nausea Only    Shaking real bad on medicaton  . Codeine Nausea And Vomiting    Pass out  . Sulfa Antibiotics Swelling    Current Outpatient Prescriptions  Medication Sig Dispense Refill  . anastrozole (ARIMIDEX) 1 MG tablet Take 1 tablet (1 mg total) by mouth daily.  90 tablet  3  . aspirin 325 MG tablet Take 325 mg by mouth daily.      Marland Kitchen atorvastatin (LIPITOR) 20 MG tablet Take 20 mg by mouth daily.       . calcium carbonate (OS-CAL) 600 MG TABS tablet Take 600 mg by mouth 2 (two) times daily with a meal.      .  diphenhydramine-acetaminophen (TYLENOL PM) 25-500 MG TABS Take 1 tablet by mouth at bedtime as needed.      . furosemide (LASIX) 40 MG tablet Take 40 mg by mouth daily.       Marland Kitchen gabapentin (NEURONTIN) 300 MG capsule Take 1 capsule (300 mg total) by mouth at bedtime.  90 capsule  2  . lisinopril (PRINIVIL,ZESTRIL) 20 MG tablet Take 20 mg by mouth daily.      . minoxidil (ROGAINE) 2 % external solution Apply topically 2 (two) times daily.      . Multiple Vitamin (MULTIVITAMIN WITH MINERALS) TABS tablet Take 1 tablet by mouth daily.      Marland Kitchen omeprazole (PRILOSEC) 20 MG capsule       . Vitamin D, Ergocalciferol, (DRISDOL) 50000 UNITS CAPS capsule Take 50,000 Units by mouth.      Marland Kitchen allopurinol (ZYLOPRIM) 100 MG tablet Take 100 mg by mouth daily.       Marland Kitchen allopurinol (ZYLOPRIM) 300 MG tablet Take 300 mg by mouth daily.      Marland Kitchen HYDROcodone-acetaminophen (NORCO/VICODIN) 5-325 MG per tablet Take 1 tablet by mouth every 4 (four) hours as needed.  20 tablet  0  . LORazepam (ATIVAN) 0.5 MG tablet Take 0.5 mg by mouth every 8 (eight) hours.       . methylPREDNIsolone (MEDROL  DOSPACK) 4 MG tablet follow package directions  21 tablet  0  . potassium chloride SA (K-DUR,KLOR-CON) 20 MEQ tablet Take 1 tablet (20 mEq total) by mouth daily.  90 tablet  12   No current facility-administered medications for this visit.    OBJECTIVE: Older white woman in no acute distress Filed Vitals:   07/12/13 1133  BP: 137/77  Pulse: 78  Temp: 97.8 F (36.6 C)  Resp: 18     Body mass index is 26.92 kg/(m^2).    ECOG FS:1 - Symptomatic but completely ambulatory Filed Weights   07/12/13 1133  Weight: 156 lb 14.4 oz (71.169 kg)   Physical Exam: HEENT:  Sclerae anicteric.  Oropharynx clear and moist. No pharyngeal erythema. Neck is supple, trachea midline NODES:  No cervical or supraclavicular lymphadenopathy palpated.  BREAST EXAM:  Deferred. Axillae are benign bilaterally with no palpable lymphadenopathy. LUNGS:  Clear to auscultation bilaterally with good excursion.  No wheezes or rhonchi HEART:  Regular rate and rhythm. No murmur appreciated. ABDOMEN:  Soft, nontender to palpation. No organomegaly palpated. Positive bowel sounds.  MSK:  No focal spinal tenderness to palpation, including the lower back where the patient complains of chronic pain. Good range of motion bilaterally in the upper extremities. No joint swelling. EXTREMITIES:  No peripheral edema.   SKIN: There is some mild erythema of the left ankle, consistent with a previous rash which has almost resolved. The patient also has a dark red, dry-appearing rash bilaterally on the inner thighs. No vesicles or pustules are noted. She also has a diffuse maculopapular an erythematous rash on the trunk, on the front and back, and equal bilaterally. Once again, no vesicles or pustules are noted.  NEURO:  Nonfocal. Well oriented.  Appropriate affect.   LAB RESULTS:   Lab Results  Component Value Date   WBC 4.8 07/12/2013   NEUTROABS 2.3 07/12/2013   HGB 10.3* 07/12/2013   HCT 32.7* 07/12/2013   MCV 100.9 07/12/2013   PLT  149 07/12/2013      Chemistry      Component Value Date/Time   NA 137 06/21/2013 1355   K 5.0 06/21/2013 1355  CO2 15* 06/21/2013 1355   BUN 89.0* 06/21/2013 1355   CREATININE 2.6* 06/21/2013 1355      Component Value Date/Time   CALCIUM 9.6 06/21/2013 1355   ALKPHOS 142 06/21/2013 1355   AST 21 06/21/2013 1355   ALT 21 06/21/2013 1355   BILITOT 0.30 06/21/2013 1355       STUDIES:  Most recent echocardiogram on 05/13/2013 showed an ejection fraction of 55-60%.  Patient tells me she had a bone density test at [facility] (Dr. Nancee Liter office) in December 2014. This showed osteopenia with a T score of -1.9.  ASSESSMENT: 80 y.o. [place] woman, status post right breast biopsy 03/15/2013 for a clinical T1c N0, stage IA invasive ductal carcinoma, grade 1 or 2, estrogen receptor 100% positive, progesterone receptor 100% positive, with an MIB-1 of 30%, and HER-2 amplified by CISH, with a ratio of 3.51 and an average HER-2 copy number Damita Lack of 6.85  (1) biopsy of 2 suspicious areas in the left breast both showed no evidence of malignancy  (2) right lumpectomy and sentinel lymph node sampling 04/28/2013 showed a pT1b pN0, stage IA invasive ductal carcinoma, grade 2, with negative margins  (3) trastuzumab to be started 05/31/2013, to be given every 3 weeks for total of 9 doses; initial echocardiogram 05/13/2013 shows a normal ejection fraction  (4) anastrozole started 05/08/2013  (5) osteopenia documented by bone density in December 2014, with T score -1.9  (6)  Will not need radiation therapy.   PLAN: Kayliegh will proceed to treatment today as scheduled for trastuzumab, and she will continue on anastrozole as before. We are going to make some changes, however, to her home medications.  I am going to start by treating her current rashes. The rash on her inner thighs appears to be yeast related, and she will use a topical antifungal twice daily. (I am not starting her on fluconazole  since there is a possible interaction with her atorvastatin.)  With regards to the rash on her trunk, this appears to be  a dermatitis, and could possibly be related to one of her medications.  I am starting her on a Medrol Dosepak. For now, she'll continue on both the trastuzumab and anastrozole. If the rash recurs, however, we may need to hold these drugs briefly, reintroduce them one at a time, and determine whether or not she is having an allergic reaction. (Of note, Ayah can't remember exactly when the rash first appeared, and can't tell me for sure if it started in November after introducing the anastrozole, or in December after introducing the trastuzumab.)  For her hot flashes and insomnia, we are going to increase the gabapentin dose from 200 mg  to300 mg at bedtime. I have also refilled her lorazepam to take sparingly as needed.  We will recheck the patient's metabolic panel today, primarily to follow her hypokalemia. If her potassium drops again, we will restart a potassium supplement. If that becomes necessary, we need to consider prescribing Micro-K since it is smaller and easier for the patient to swallow.  Samiah is scheduled for an echocardiogram on Wednesday, January 21. They're planning to leave for early next week for [place], so I will plan on seeing her on Friday, January 23, to review her echo results, and also to assess her rashes improvement.    Once they go to [place], they plan to stay for 2 months, throughout February and March. Her plan is to  find an oncology center covered under her insurance and will receive  trastuzumab in [place]. Per Dr. Virgie Dad plan, we hope to complete a total of 9 doses of trastuzumab, repeating her echocardiogram after every 3 doses. When she returns to [place] in April, we do need to discuss initiating bisphosphonate therapy, but she would like to wait until she is back in town. In the meanwhile, she will continue on calcium and vitamin D  supplements, and I encouraged her to walk on a daily basis.  All this was reviewed in detail with [name] and her daughter today, and Hermie was given all of the above information in writing. She voices understanding and agreement with this plan, and will call with any changes or problems prior to her next appointment.    Lilienne Weins, PA-C   07/12/2013 1:10 PM

## 2013-07-12 NOTE — Telephone Encounter (Signed)
Refill on Lorazepam 0.5 mg 1 tab PO BID PRN. Disp #30, Refill- 0 per Micah Flesher, PA-C.

## 2013-07-13 ENCOUNTER — Telehealth: Payer: Self-pay | Admitting: *Deleted

## 2013-07-13 NOTE — Telephone Encounter (Signed)
Per PA-C request. Called pt to inform her of Potassium level and told her she doesn't need to take K+ right now. Pt verbalized understanding. No further concerns. Message to be forwarded to [employer].

## 2013-07-14 ENCOUNTER — Ambulatory Visit: Payer: Medicare Other

## 2013-07-14 ENCOUNTER — Ambulatory Visit (HOSPITAL_COMMUNITY)
Admission: RE | Admit: 2013-07-14 | Discharge: 2013-07-14 | Disposition: A | Discharge status: Home / Self Care | Payer: Medicare Other | Source: Ambulatory Visit | Attending: Physician Assistant | Admitting: Physician Assistant

## 2013-07-14 DIAGNOSIS — C50419 Malignant neoplasm of upper-outer quadrant of unspecified female breast: Secondary | ICD-10-CM | POA: Insufficient documentation

## 2013-07-14 DIAGNOSIS — I079 Rheumatic tricuspid valve disease, unspecified: Secondary | ICD-10-CM | POA: Insufficient documentation

## 2013-07-14 DIAGNOSIS — C50411 Malignant neoplasm of upper-outer quadrant of right female breast: Secondary | ICD-10-CM

## 2013-07-14 DIAGNOSIS — I369 Nonrheumatic tricuspid valve disorder, unspecified: Secondary | ICD-10-CM

## 2013-07-14 DIAGNOSIS — I517 Cardiomegaly: Secondary | ICD-10-CM | POA: Insufficient documentation

## 2013-07-14 DIAGNOSIS — I359 Nonrheumatic aortic valve disorder, unspecified: Secondary | ICD-10-CM | POA: Insufficient documentation

## 2013-07-14 NOTE — Progress Notes (Signed)
Echocardiogram 2D Echocardiogram has been performed.  Joelene Millin 07/14/2013, 11:56 AM

## 2013-07-16 ENCOUNTER — Encounter: Payer: Self-pay | Admitting: Physician Assistant

## 2013-07-16 ENCOUNTER — Ambulatory Visit (HOSPITAL_BASED_OUTPATIENT_CLINIC_OR_DEPARTMENT_OTHER): Payer: Medicare Other | Admitting: Physician Assistant

## 2013-07-16 ENCOUNTER — Telehealth: Payer: Self-pay | Admitting: Oncology

## 2013-07-16 VITALS — BP 133/75 | HR 78 | Temp 97.8°F | Resp 20 | Ht 64.0 in | Wt 158.8 lb

## 2013-07-16 DIAGNOSIS — R232 Flushing: Secondary | ICD-10-CM

## 2013-07-16 DIAGNOSIS — C50411 Malignant neoplasm of upper-outer quadrant of right female breast: Secondary | ICD-10-CM

## 2013-07-16 DIAGNOSIS — L270 Generalized skin eruption due to drugs and medicaments taken internally: Secondary | ICD-10-CM

## 2013-07-16 DIAGNOSIS — T451X5A Adverse effect of antineoplastic and immunosuppressive drugs, initial encounter: Secondary | ICD-10-CM

## 2013-07-16 NOTE — Progress Notes (Signed)
ID: [name] OB: Feb 05, 1932  MR#: [id number]  LKG#:[id number]  PCP: Horton Finer, MD GYN:   SU: Rolm Bookbinder OTHER MD: Floyde Parkins, Earle Gell,  CHIEF COMPLAINT:  Right Breast Cancer   HISTORY OF PRESENT ILLNESS: Leiloni had routine screening mammography at [facility] 03/04/2013 showing a possible mass in the right breast, measuring 4 mm in diameter. Additional views 03/10/2013 confirmed an area of asymmetry in the right breast which was hypoechoic by ultrasonography. Biopsy of this area 03/15/2013 showed ([id number]) and invasive ductal carcinoma, grade 1 or 2, estrogen receptor 100% positive, progesterone receptor 100% positive, with a proliferation marker of 30% and HER-2 amplification by CISH with a ratio of 3.51 and an average HER-2 copy number of 6.85.  On 04/05/2013 the patient underwent bilateral breast MRIs. This showed in the right breast an 8 mm mass at the site of the known biopsy site, with surrounding known masslike enhancement. The entire area of concern measured 1.9 cm. There were no abnormal appearing lymph nodes of concern. In the left breast there were 2 areas of clumped non-masslike enhancement measuring 0.5 cm and 2.4 cm.  The patient's subsequent history is as detailed below  INTERVAL HISTORY: Cozette returns alone today (Her 82th birthday!) for followup of her right  breast cancer.   As a brief recap, she continues on anastrozole which she started 05/08/2013, and she has been receiving trastuzumab every 3 weeks, most recently given on 07/12/2013. She status post 3 of 9 planned q. three-week doses of trastuzumab.  When seen here earlier this week, Sari had multiple concerns. She had developed a rash on her inner thighs, and had also developed a rash on her trunk area. She used a topical antifungal cream on her thighs, and that rash has improved significantly.  She was unsure when the rash on her trunk first appeared, whether it was after starting the  anastrozole or after introducing the trastuzumab. She was given her trastuzumab infusion Monday and is here to reassess the rash. There is no doubt that after her infusion on Monday, the rash has worsened. It is more "angry" and erythematous in appearance. It is slightly itchy, but "mostly aggravating" to [name]. There are no lesions or vesicles noted.  I also increased her on gabapentin last week to 300 mg nightly instead of 200 milligrams for hot flashes and insomnia. She's tolerating this dose well. She does feel like it has helped her.  She is having fewer hot flashes and is sleeping better and it has also helped decrease her pre-existing neuropathy.  Salisa and her husband had hoped to leave this coming Monday, January 26, to go to [place] for at least 1 month. Unfortunately, she tells me their RV is broken down, and they may have to cancel the trip.   REVIEW OF SYSTEMS: Cory denies any recent fevers or illnesses. Her energy level is still low. She denies any abnormal bleeding. Her appetite is fair, but fortunately she is having no problems with nausea or emesis. She does have intermittent diarrhea which is not unusual for her due to a history of IBS.   She's had no change in urinary habits.  She has some shortness of breath with exertion which is stable. She currently denies any cough, phlegm production, orthopnea, peripheral swelling, chest pain, or palpitations. An echocardiogram earlier this week showed an ejection fraction of 55-60%. She has chronic back pain and joint pain, also stable. She has occasional headaches, but no significant change in vision and  no recent problems with dizziness.  A detailed review of systems is otherwise stable and noncontributory.   PAST MEDICAL HISTORY: Past Medical History  Diagnosis Date  . Hyperlipidemia   . Hypertension   . Chronic kidney disease   . Osteoporosis   . GERD (gastroesophageal reflux disease)   . IBS (irritable bowel syndrome)   . Arthritis    . Neuromuscular disorder     peripheral neuropathy feet  . History of lower GI bleeding   . Breast cancer 03/15/13    right, 12 o'clock    PAST SURGICAL HISTORY: Past Surgical History  Procedure Laterality Date  . Cataract extraction  2009    both  . Skin cancer excision      head, face, hand ..multiple years  . Abdominal hysterectomy  1975    No salpingo-oophorectomy  . Tonsillectomy    . Colonoscopy    . Breast lumpectomy with needle localization and axillary sentinel lymph node bx Right 04/28/2013    Procedure: BREAST LUMPECTOMY WITH NEEDLE LOCALIZATION AND AXILLARY SENTINEL LYMPH NODE BX;  Surgeon: Rolm Bookbinder, MD;  Location: [facility];  Service: General;  Laterality: Right;    FAMILY HISTORY Family History  Problem Relation Age of Onset  . Cancer Maternal Aunt 80    colon cancer  . Cancer Maternal Uncle 65    lung  . Cancer Maternal Grandmother     pancreas   the patient's father died at the age of 55 from a subarachnoid hemorrhage. The patient's mother lived to be and 33.57 years old. She lived independently to be and. The patient had one brother, no sisters. There is a history of colon cancer or in a maternal aunts, diagnosed at age 56, and lung cancer in a maternal uncle diagnosed at age 36. There is no history of breast or ovarian cancer in the family  GYNECOLOGIC HISTORY:  (updated December 2000 410)  Mersades can't remember when she had her first period. First live birth came at age 38 and she is [place] 2. She she took hormone replacement, namely estrogen, until 2014 when she was diagnosed with breast cancer.  SOCIAL HISTORY:  (Updated December 2014. 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 Her husband [name] is retired, he is largely disabled secondary to back problems following a fall. Daughter Salvatrice Morandi is a retired Pharmacist, hospital living in [place]. [name] older son runs a Engineer, civil (consulting) and business in  [place] and [name] helps out with that. The patient's son [name] lives in [place]. He is an Chief Financial Officer. Altogether Anani has 4 grandsons, 3 great-grandsons and one great-granddaughter    ADVANCED DIRECTIVES: In place   HEALTH MAINTENANCE:  (Updated January 2015) History  Substance Use Topics  . Smoking status: Never Smoker   . Smokeless tobacco: Never Used  . Alcohol Use: No     Colonoscopy: 2011  PAP: Status post hysterectomy  Bone density:Dec 2014, [facility], osteopenia with T   score of -1.9  Lipid panel: Not on file/Dr. Maxwell Caul   Allergies  Allergen Reactions  . Levaquin [Levofloxacin In D5w] Anaphylaxis and Nausea Only    Shaking real bad on medicaton  . Codeine Nausea And Vomiting    Pass out  . Sulfa Antibiotics Swelling    Current Outpatient Prescriptions  Medication Sig Dispense Refill  . allopurinol (ZYLOPRIM) 100 MG tablet Take 100 mg by mouth daily.       Marland Kitchen allopurinol (ZYLOPRIM) 300  MG tablet Take 300 mg by mouth daily.      Marland Kitchen anastrozole (ARIMIDEX) 1 MG tablet Take 1 tablet (1 mg total) by mouth daily.  90 tablet  3  . aspirin 325 MG tablet Take 325 mg by mouth daily.      Marland Kitchen atorvastatin (LIPITOR) 20 MG tablet Take 20 mg by mouth daily.       . calcium carbonate (OS-CAL) 600 MG TABS tablet Take 600 mg by mouth 2 (two) times daily with a meal.      . diphenhydramine-acetaminophen (TYLENOL PM) 25-500 MG TABS Take 1 tablet by mouth at bedtime as needed.      . furosemide (LASIX) 40 MG tablet Take 40 mg by mouth daily.       Marland Kitchen gabapentin (NEURONTIN) 300 MG capsule Take 1 capsule (300 mg total) by mouth at bedtime.  90 capsule  2  . lisinopril (PRINIVIL,ZESTRIL) 20 MG tablet Take 20 mg by mouth daily.      Marland Kitchen LORazepam (ATIVAN) 0.5 MG tablet Take 1 tablet (0.5 mg total) by mouth 2 (two) times daily as needed for anxiety.  30 tablet  0  . methylPREDNIsolone (MEDROL DOSPACK) 4 MG tablet follow package directions  21 tablet  0  . minoxidil (ROGAINE) 2 %  external solution Apply topically 2 (two) times daily.      . Multiple Vitamin (MULTIVITAMIN WITH MINERALS) TABS tablet Take 1 tablet by mouth daily.      Marland Kitchen omeprazole (PRILOSEC) 20 MG capsule       . potassium chloride SA (K-DUR,KLOR-CON) 20 MEQ tablet Take 1 tablet (20 mEq total) by mouth daily.  90 tablet  12  . Vitamin D, Ergocalciferol, (DRISDOL) 50000 UNITS CAPS capsule Take 50,000 Units by mouth.      Marland Kitchen HYDROcodone-acetaminophen (NORCO/VICODIN) 5-325 MG per tablet Take 1 tablet by mouth every 4 (four) hours as needed.  20 tablet  0   No current facility-administered medications for this visit.    OBJECTIVE: Older white woman in no acute distress Filed Vitals:   07/16/13 0911  BP: 133/75  Pulse: 78  Temp: 97.8 F (36.6 C)  Resp: 20     Body mass index is 27.24 kg/(m^2).    ECOG FS:1 - Symptomatic but completely ambulatory Filed Weights   07/16/13 0911  Weight: 158 lb 12.8 oz (72.031 kg)   Physical Exam: HEENT:  Sclerae anicteric.  Oropharynx clear and moist.  NODES:  No cervical or supraclavicular lymphadenopathy palpated.  BREAST EXAM:  Deferred. Axillae are benign bilaterally, no palpable lymphadenopathy. LUNGS:  Clear to auscultation bilaterally.  No wheezes or rhonchi HEART:  Regular rate and rhythm. No murmur  ABDOMEN:  Soft, nontender.  Positive bowel sounds.  SKIN:  There is a maculopapular erythematous rash diffusely on the trunk, more so anteriorly than posteriorly. Is definitely more erythematous and more widespread than when examined earlier this week prior to receiving trastuzumab on 07/12/2013. There no vesicles or pustules noted. NEURO:  Nonfocal. Well oriented.  Appropriate affect.    LAB RESULTS:   Lab Results  Component Value Date   WBC 4.8 07/12/2013   NEUTROABS 2.3 07/12/2013   HGB 10.3* 07/12/2013   HCT 32.7* 07/12/2013   MCV 100.9 07/12/2013   PLT 149 07/12/2013      Chemistry      Component Value Date/Time   NA 141 07/12/2013 1311   K 4.2  07/12/2013 1311   CO2 25 07/12/2013 1311   BUN 19.2 07/12/2013 1311  CREATININE 1.1 07/12/2013 1311      Component Value Date/Time   CALCIUM 9.5 07/12/2013 1311   ALKPHOS 104 07/12/2013 1311   AST 26 07/12/2013 1311   ALT 31 07/12/2013 1311   BILITOT 0.46 07/12/2013 1311       STUDIES:  Most recent echocardiogram on 07/14/2013 showed an ejection fraction of 55-60%.  Patient tells me she had a bone density test at [facility] (Dr. Nancee Liter office) in December 2014. This showed osteopenia with a T score of -1.9.  ASSESSMENT: 80 y.o. [place] woman, status post right breast biopsy 03/15/2013 for a clinical T1c N0, stage IA invasive ductal carcinoma, grade 1 or 2, estrogen receptor 100% positive, progesterone receptor 100% positive, with an MIB-1 of 30%, and HER-2 amplified by CISH, with a ratio of 3.51 and an average HER-2 copy number Damita Lack of 6.85  (1) biopsy of 2 suspicious areas in the left breast both showed no evidence of malignancy  (2) right lumpectomy and sentinel lymph node sampling 04/28/2013 showed a pT1b pN0, stage IA invasive ductal carcinoma, grade 2, with negative margins  (3) trastuzumab to be started 05/31/2013, to be given every 3 weeks for total of 9 doses; being held as of 07/16/2013 after 3 doses to assess for possible allergic dermatitis associated with the drug.  (4) anastrozole started 05/08/2013  (5) osteopenia documented by bone density in December 2014, with T score -1.9  (6)  Will not need radiation therapy.   PLAN: Dr. Jana Hakim has also spoken with the patient and took a look at her rash. It is very suspicious the thoracic worsens when she receives her trastuzumab. Per his recommendation, we're going to hold her next dose of trastuzumab in 3 weeks.  We will see her back in 6 weeks for reassessment, and we'll consider reinitiating the trastuzumab at that time. That appointment has been made for March 2. In the meanwhile, she will continue on the anastrozole as  before.   This was reviewed in detail with [name] today, and she voices both her understanding and agreement with this plan.  She will call us with any changes or problems prior to her next scheduled appointment. If she resumes the trastuzumab, the next echocardiogram will be due after 3 additional doses, approximately mid April. This has not yet been scheduled.    Kaicee Scarpino, PA-C   07/16/2013 4:10 PM

## 2013-07-16 NOTE — Telephone Encounter (Signed)
, °

## 2013-07-20 ENCOUNTER — Other Ambulatory Visit: Payer: Self-pay | Admitting: Physician Assistant

## 2013-07-20 ENCOUNTER — Telehealth: Payer: Self-pay | Admitting: Oncology

## 2013-07-20 ENCOUNTER — Telehealth: Payer: Self-pay | Admitting: *Deleted

## 2013-07-20 NOTE — Telephone Encounter (Signed)
, °

## 2013-07-20 NOTE — Telephone Encounter (Signed)
PT. WOULD LIKE TO GO TO [place] FOR A FEW WEEKS. VERBAL ORDER AND READ BACK TO AMY BERRY,PA- HAVE PT. COME TO THE OFFICE ON 09/13/13 FOR LAB AT 11:00AM AND SEE DR.MAGRINAT AT 11:30AM. NOTIFIED PT. SHE VOICES UNDERSTANDING.

## 2013-07-21 ENCOUNTER — Telehealth: Payer: Self-pay | Admitting: *Deleted

## 2013-07-21 NOTE — Telephone Encounter (Signed)
Per staff message and POF I have scheduled appts.  JMW  

## 2013-08-16 ENCOUNTER — Telehealth: Payer: Self-pay | Admitting: *Deleted

## 2013-08-16 ENCOUNTER — Encounter: Payer: Self-pay | Admitting: Physician Assistant

## 2013-08-16 NOTE — Telephone Encounter (Signed)
Patient calling, states she is doing better and will not be going to [place] and will be here to get infusion on 3/2 if Tavonte Seybold Gwenlyn Found wants to reschedule this. Will forward to Naithan Delage for needed orders.

## 2013-08-17 ENCOUNTER — Other Ambulatory Visit: Payer: Self-pay | Admitting: Physician Assistant

## 2013-08-18 ENCOUNTER — Telehealth: Payer: Self-pay | Admitting: *Deleted

## 2013-08-18 ENCOUNTER — Other Ambulatory Visit: Payer: Self-pay | Admitting: Physician Assistant

## 2013-08-18 NOTE — Telephone Encounter (Signed)
Called pt to inform her of her options for appt on 3/2. Pt would like to keep to the one day appt. She will come for labs, herceptin and will see Dr. Owens Loffler on that day. She was okay with that and said her rash is nearly gone now. Message to be forwarded to [employer], PA-C.

## 2013-08-19 ENCOUNTER — Telehealth: Payer: Self-pay | Admitting: *Deleted

## 2013-08-19 ENCOUNTER — Telehealth: Payer: Self-pay | Admitting: Oncology

## 2013-08-19 NOTE — Telephone Encounter (Signed)
Per staff message and POF I have scheduled appts. Advised scheduler that 115pm first available  JMW

## 2013-08-19 NOTE — Telephone Encounter (Signed)
, °

## 2013-08-20 ENCOUNTER — Encounter: Payer: Self-pay | Admitting: Oncology

## 2013-08-23 ENCOUNTER — Telehealth: Payer: Self-pay | Admitting: *Deleted

## 2013-08-23 ENCOUNTER — Ambulatory Visit (HOSPITAL_BASED_OUTPATIENT_CLINIC_OR_DEPARTMENT_OTHER): Payer: Medicare Other | Admitting: Hematology and Oncology

## 2013-08-23 ENCOUNTER — Other Ambulatory Visit: Payer: Medicare Other

## 2013-08-23 ENCOUNTER — Ambulatory Visit: Payer: Medicare Other

## 2013-08-23 ENCOUNTER — Other Ambulatory Visit (HOSPITAL_BASED_OUTPATIENT_CLINIC_OR_DEPARTMENT_OTHER): Payer: Medicare Other

## 2013-08-23 ENCOUNTER — Ambulatory Visit: Payer: Medicare Other | Admitting: Physician Assistant

## 2013-08-23 ENCOUNTER — Encounter: Payer: Self-pay | Admitting: Hematology and Oncology

## 2013-08-23 VITALS — BP 143/73 | HR 75 | Temp 98.0°F | Resp 18 | Ht 64.0 in | Wt 157.6 lb

## 2013-08-23 DIAGNOSIS — M949 Disorder of cartilage, unspecified: Secondary | ICD-10-CM

## 2013-08-23 DIAGNOSIS — M810 Age-related osteoporosis without current pathological fracture: Secondary | ICD-10-CM

## 2013-08-23 DIAGNOSIS — G8929 Other chronic pain: Secondary | ICD-10-CM

## 2013-08-23 DIAGNOSIS — C50411 Malignant neoplasm of upper-outer quadrant of right female breast: Secondary | ICD-10-CM

## 2013-08-23 DIAGNOSIS — Z170 Estrogen receptor positive status [ER+]: Secondary | ICD-10-CM

## 2013-08-23 DIAGNOSIS — R21 Rash and other nonspecific skin eruption: Secondary | ICD-10-CM

## 2013-08-23 DIAGNOSIS — L270 Generalized skin eruption due to drugs and medicaments taken internally: Secondary | ICD-10-CM

## 2013-08-23 DIAGNOSIS — R0989 Other specified symptoms and signs involving the circulatory and respiratory systems: Secondary | ICD-10-CM

## 2013-08-23 DIAGNOSIS — M899 Disorder of bone, unspecified: Secondary | ICD-10-CM

## 2013-08-23 DIAGNOSIS — M549 Dorsalgia, unspecified: Secondary | ICD-10-CM

## 2013-08-23 DIAGNOSIS — C50919 Malignant neoplasm of unspecified site of unspecified female breast: Secondary | ICD-10-CM

## 2013-08-23 DIAGNOSIS — R0609 Other forms of dyspnea: Secondary | ICD-10-CM

## 2013-08-23 DIAGNOSIS — D649 Anemia, unspecified: Secondary | ICD-10-CM

## 2013-08-23 LAB — CBC WITH DIFFERENTIAL/PLATELET
BASO%: 0.4 % (ref 0.0–2.0)
Basophils Absolute: 0 10*3/uL (ref 0.0–0.1)
EOS%: 4.5 % (ref 0.0–7.0)
Eosinophils Absolute: 0.2 10*3/uL (ref 0.0–0.5)
HCT: 35 % (ref 34.8–46.6)
HGB: 11.4 g/dL — ABNORMAL LOW (ref 11.6–15.9)
LYMPH%: 33.5 % (ref 14.0–49.7)
MCH: 32.4 pg (ref 25.1–34.0)
MCHC: 32.6 g/dL (ref 31.5–36.0)
MCV: 99.4 fL (ref 79.5–101.0)
MONO#: 0.3 10*3/uL (ref 0.1–0.9)
MONO%: 6.9 % (ref 0.0–14.0)
NEUT#: 2.7 10*3/uL (ref 1.5–6.5)
NEUT%: 54.7 % (ref 38.4–76.8)
Platelets: 145 10*3/uL (ref 145–400)
RBC: 3.52 10*6/uL — ABNORMAL LOW (ref 3.70–5.45)
RDW: 14.4 % (ref 11.2–14.5)
WBC: 4.9 10*3/uL (ref 3.9–10.3)
lymph#: 1.7 10*3/uL (ref 0.9–3.3)
nRBC: 0 % (ref 0–0)

## 2013-08-23 NOTE — Progress Notes (Signed)
. ID: Rebecca Hall OB: 09/05/1931  MR#: [id number]  CSN#:[id number]  PCP: Horton Finer, MD GYN:   SURolm Bookbinder OTHER MD: Floyde Parkins, Earle Gell,  CHIEF COMPLAINT:  Right Breast Cancer prior skin rash/dermatitis   HISTORY OF PRESENT ILLNESS: Rebecca Hall had routine screening mammography at [facility] 03/04/2013 showing a possible mass in the right breast, measuring 4 mm in diameter. Additional views 03/10/2013 confirmed an area of asymmetry in the right breast which was hypoechoic by ultrasonography. Biopsy of this area 03/15/2013 showed ([id number]) and invasive ductal carcinoma, grade 1 or 2, estrogen receptor 100% positive, progesterone receptor 100% positive, with a proliferation marker of 30% and Rebecca-2 amplification by CISH with a ratio of 3.51 and an average Rebecca-2 copy number of 6.85.  On 04/05/2013 the patient underwent bilateral breast MRIs. This showed in the right breast an 8 mm mass at the site of the known biopsy site, with surrounding known masslike enhancement. The entire area of concern measured 1.9 cm. There were no abnormal appearing lymph nodes of concern. In the left breast there were 2 areas of clumped non-masslike enhancement measuring 0.5 cm and 2.4 cm.  The patient's subsequent history is as detailed below  INTERVAL HISTORY: Rebecca Hall returns alone today (Rebecca Hall!) for followup of Rebecca right  breast cancer.   As a brief review, she continues on anastrozole which she started 05/08/2013, and she has been receiving trastuzumab every 3 weeks, most recently given on 07/12/2013. She status post 3 of 9 planned q. three-week doses of trastuzumab. Patient has received 3 cycles out of projected 9 with herceptin and Rebecca 4th treatment was held due to ongoing problems with an extensive skin rash as per previous notes.she responded well to medrol pack eventually.She denies skin problems today. She denies palpitations or leg edema. She reports some hair loss. She  sleeps better with neurontin but Rebecca hot flushes continue. She denies severe bone or muscle pain.She continues on allopurinor due to a prior diagnosis of gout.  REVIEW OF SYSTEMS: Patient denies any recent fever or illnesses. Rebecca energy level is  low. She denies any abnormal bleeding. Rebecca appetite is fair, but  she denies problems with nausea or emesis. She does have intermittent diarrhea which is not unusual for Rebecca due to a history of IBS.   She's had no change in urinary habits.  She has some shortness of breath with exertion which is stable. She currently denies any cough, orthopnea, chest pain.Prior  echocardiogram showed an ejection fraction of 55-60%. She has chronic back pain and joint pain, also stable. She has occasional headaches, but no significant change in vision and no recent problems with dizziness.  A detailed review of systems is otherwise stable and noncontributory.   PAST MEDICAL HISTORY: Past Medical History  Diagnosis Date  . Hyperlipidemia   . Hypertension   . Chronic kidney disease   . Osteoporosis   . GERD (gastroesophageal reflux disease)   . IBS (irritable bowel syndrome)   . Arthritis   . Neuromuscular disorder     peripheral neuropathy feet  . History of lower GI bleeding   . Breast cancer 03/15/13    right, 12 o'clock    PAST SURGICAL HISTORY: Past Surgical History  Procedure Laterality Date  . Cataract extraction  2009    both  . Skin cancer excision      head, face, hand ..multiple years  . Abdominal hysterectomy  1975    No salpingo-oophorectomy  . Tonsillectomy    .  Colonoscopy    . Breast lumpectomy with needle localization and axillary sentinel lymph node bx Right 04/28/2013    Procedure: BREAST LUMPECTOMY WITH NEEDLE LOCALIZATION AND AXILLARY SENTINEL LYMPH NODE BX;  Surgeon: Rolm Bookbinder, MD;  Location: [facility];  Service: General;  Laterality: Right;    FAMILY HISTORY Family History  Problem Relation Age of Onset   . Cancer Maternal Aunt 80    colon cancer  . Cancer Maternal Uncle 65    lung  . Cancer Maternal Grandmother     pancreas   the patient's father died at the age of 78 from a subarachnoid hemorrhage. The patient's mother lived to be and 65.63 years old. She lived independently to be and. The patient had one brother, no sisters. There is a history of colon cancer or in a maternal aunts, diagnosed at age 32, and lung cancer in a maternal uncle diagnosed at age 52. There is no history of breast or ovarian cancer in the family  GYNECOLOGIC HISTORY:  (updated December 2000 410)  Melysa can't remember when she had Rebecca first period. First live birth came at age 59 and she is [place] 2. She she took hormone replacement, namely estrogen, until 2014 when she was diagnosed with breast cancer.  SOCIAL HISTORY:  (Updated December 2014. 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 Rebecca husband [name] is retired, he is largely disabled secondary to back problems following a fall. Daughter Khalil Belote is a retired Pharmacist, hospital living in [place]. [name] older son runs a Engineer, civil (consulting) and business in [place] and [name] helps out with that. The patient's son [name] lives in [place]. He is an Chief Financial Officer. Altogether Luticia has 4 grandsons, 3 great-grandsons and one great-granddaughter    ADVANCED DIRECTIVES: In place   HEALTH MAINTENANCE:  (Updated January 2015) History  Substance Use Topics  . Smoking status: Never Smoker   . Smokeless tobacco: Never Used  . Alcohol Use: No     Colonoscopy: 2011  PAP: Status post hysterectomy  Bone density:Dec 2014, [facility], osteopenia with T   score of -1.9  Lipid panel: Not on file/Dr. Maxwell Caul   Allergies  Allergen Reactions  . Levaquin [Levofloxacin In D5w] Anaphylaxis and Nausea Only    Shaking real bad on medicaton  . Herceptin [Trastuzumab] Rash    Patient has developed extensive body skin rash following each  Herceptin treatment of increased presence following each treatment  that cleared with oral steroids  . Codeine Nausea And Vomiting    Pass out  . Sulfa Antibiotics Swelling    Current Outpatient Prescriptions  Medication Sig Dispense Refill  . allopurinol (ZYLOPRIM) 100 MG tablet Take 100 mg by mouth daily.       Marland Kitchen allopurinol (ZYLOPRIM) 300 MG tablet Take 300 mg by mouth daily.      Marland Kitchen anastrozole (ARIMIDEX) 1 MG tablet Take 1 tablet (1 mg total) by mouth daily.  90 tablet  3  . aspirin 325 MG tablet Take 325 mg by mouth daily.      Marland Kitchen atorvastatin (LIPITOR) 20 MG tablet Take 20 mg by mouth daily.       . calcium carbonate (OS-CAL) 600 MG TABS tablet Take 600 mg by mouth 2 (two) times daily with a meal.      . diphenhydramine-acetaminophen (TYLENOL PM) 25-500 MG TABS Take 1 tablet by mouth at bedtime as needed.      . furosemide (LASIX) 40  MG tablet Take 40 mg by mouth daily.       Marland Kitchen gabapentin (NEURONTIN) 300 MG capsule Take 1 capsule (300 mg total) by mouth at bedtime.  90 capsule  2  . HYDROcodone-acetaminophen (NORCO/VICODIN) 5-325 MG per tablet Take 1 tablet by mouth every 4 (four) hours as needed.  20 tablet  0  . lisinopril (PRINIVIL,ZESTRIL) 20 MG tablet Take 20 mg by mouth daily.      . methylPREDNIsolone (MEDROL DOSPACK) 4 MG tablet follow package directions  21 tablet  0  . minoxidil (ROGAINE) 2 % external solution Apply topically 2 (two) times daily.      . Multiple Vitamin (MULTIVITAMIN WITH MINERALS) TABS tablet Take 1 tablet by mouth daily.      Marland Kitchen omeprazole (PRILOSEC) 20 MG capsule       . potassium chloride SA (K-DUR,KLOR-CON) 20 MEQ tablet Take 1 tablet (20 mEq total) by mouth daily.  90 tablet  12  . Vitamin D, Ergocalciferol, (DRISDOL) 50000 UNITS CAPS capsule Take 50,000 Units by mouth.      Marland Kitchen LORazepam (ATIVAN) 0.5 MG tablet Take 1 tablet (0.5 mg total) by mouth 2 (two) times daily as needed for anxiety.  30 tablet  0   No current facility-administered medications for  this visit.    OBJECTIVE: Older white woman in no acute distress Filed Vitals:   08/23/13 1132  BP: 143/73  Pulse: 75  Temp: 98 F (36.7 C)  Resp: 18     Body mass index is 27.04 kg/(m^2).    ECOG FS:1,symptomatic but ambulatory Filed Weights   08/23/13 1132  Weight: 157 lb 9.6 oz (71.487 kg)   Physical Exam: HEENT:  Sclerae anicteric.  Oropharynx clear and moist.  NODES:  No cervical or supraclavicular lymphadenopathy palpated.  BREAST EXAM: bilateral no breast nodules . Axillae are benign bilaterally, no palpable lymphadenopathy. LUNGS:  Clear to auscultation bilaterally.  No wheezes or rhonchi HEART:  Regular rate and rhythm. No murmur  ABDOMEN:  Soft, nontender.  Positive bowel sounds.  SKIN:  There is no skin rash present NEURO:  Nonfocal. Well oriented.  Appropriate affect.    LAB RESULTS:   Lab Results  Component Value Date   WBC 4.9 08/23/2013   NEUTROABS 2.7 08/23/2013   HGB 11.4* 08/23/2013   HCT 35.0 08/23/2013   MCV 99.4 08/23/2013   PLT 145 08/23/2013      Chemistry      Component Value Date/Time   NA 141 07/12/2013 1311   K 4.2 07/12/2013 1311   CO2 25 07/12/2013 1311   BUN 19.2 07/12/2013 1311   CREATININE 1.1 07/12/2013 1311      Component Value Date/Time   CALCIUM 9.5 07/12/2013 1311   ALKPHOS 104 07/12/2013 1311   AST 26 07/12/2013 1311   ALT 31 07/12/2013 1311   BILITOT 0.46 07/12/2013 1311       STUDIES:  Most recent echocardiogram on 07/14/2013 showed an ejection fraction of 55-60%.  Patient tells me she had a bone density test at [facility] (Dr. Nancee Liter office) in December 2014. This showed osteopenia with a T score of -1.9.  ASSESSMENT: 80 y.o. female, status post right breast biopsy 03/15/2013 for a clinical T1c N0, stage IA invasive ductal carcinoma, grade 1 or 2, estrogen receptor 100% positive, progesterone receptor 100% positive, with an MIB-1 of 30%, and Rebecca-2 amplified by CISH, with a ratio of 3.51 and an average Rebecca-2 copy number [place] of  6.85  (1) biopsy of 2  suspicious areas in the left breast both showed no evidence of malignancy  (2) right lumpectomy and sentinel lymph node sampling 04/28/2013 showed a pT1b pN0, stage IA invasive ductal carcinoma, grade 2, with negative margins  (3) trastuzumab to be started 05/31/2013, to be given every 3 weeks for total of 9 doses; being held as of 07/16/2013 after 3 doses to assess for possible allergic dermatitis associated with the drug.  (4) anastrozole started 05/08/2013  (5) osteopenia documented by bone density in December 2014, with T score -1.9  (6)  Will not need radiation therapy.   PLAN: Breast cancer as above continue on   Arimidex 1 mg daily  Continue on calcium 600 mg 2 daily and vit D 50000 IU one tablet every 2 weeks. Bone density 05/2013 indicates worsening osteopenia  BD 05/2013 T score -1.9 BD 2012 T score -1.5 and patient now on aromatase inhibitor that will precipitate further bone thinning.Will start patient on Zometa 4 mg twice per year,monitor creat.It has improved to 1.1 previously 2.6  Due to concerns of reaction to Herceptin manifested as extensive skin rash/dermatitis, will discontinue Herceptin to avoid a systemic reaction.Patient has received 3/9 treatments. Patient is 80 yo, has breast cancer stage IA,Grade 2,ER(+). Options are  continue on anastrozole only versus investigate if Perjeta to substitude Herceptin is an option. Discussed above with Dr.Magrinat.I did not order ECHO.  Patient wishes to continue on Neurontin that helped Rebecca sleep. For hot flushes that continue to try Vit E 400 IU 2 tablets daily for now.  Mild anemia with high MCV100.9-99.4 will check vit B12.   Labs with Zometa cbc and cmp.  This was reviewed in detail with [name] today, and she voices both Rebecca understanding and agreement with this plan.  She will call us with any changes or problems prior to Rebecca next scheduled appointment.    Amada Kingfisher, MD   08/23/2013 1:58  PM Oncology/Hematology

## 2013-08-23 NOTE — Telephone Encounter (Signed)
Per staff message and POF I have scheduled appts.  JMW  

## 2013-08-23 NOTE — Telephone Encounter (Signed)
appts made and printed. Pt is aware that tx will be added. i emailed MW to add the tx...td 

## 2013-08-24 ENCOUNTER — Other Ambulatory Visit: Payer: Self-pay | Admitting: Oncology

## 2013-09-13 ENCOUNTER — Ambulatory Visit (HOSPITAL_BASED_OUTPATIENT_CLINIC_OR_DEPARTMENT_OTHER): Payer: Medicare Other

## 2013-09-13 ENCOUNTER — Other Ambulatory Visit: Payer: Self-pay

## 2013-09-13 ENCOUNTER — Other Ambulatory Visit (HOSPITAL_BASED_OUTPATIENT_CLINIC_OR_DEPARTMENT_OTHER): Payer: Medicare Other

## 2013-09-13 ENCOUNTER — Ambulatory Visit (HOSPITAL_BASED_OUTPATIENT_CLINIC_OR_DEPARTMENT_OTHER): Payer: Medicare Other | Admitting: Oncology

## 2013-09-13 ENCOUNTER — Telehealth: Payer: Self-pay | Admitting: Oncology

## 2013-09-13 VITALS — BP 164/71 | HR 75 | Temp 98.0°F | Resp 18 | Ht 64.0 in | Wt 160.0 lb

## 2013-09-13 DIAGNOSIS — L270 Generalized skin eruption due to drugs and medicaments taken internally: Secondary | ICD-10-CM

## 2013-09-13 DIAGNOSIS — M810 Age-related osteoporosis without current pathological fracture: Secondary | ICD-10-CM

## 2013-09-13 DIAGNOSIS — C50411 Malignant neoplasm of upper-outer quadrant of right female breast: Secondary | ICD-10-CM

## 2013-09-13 DIAGNOSIS — C50919 Malignant neoplasm of unspecified site of unspecified female breast: Secondary | ICD-10-CM

## 2013-09-13 DIAGNOSIS — M949 Disorder of cartilage, unspecified: Secondary | ICD-10-CM

## 2013-09-13 DIAGNOSIS — E876 Hypokalemia: Secondary | ICD-10-CM

## 2013-09-13 DIAGNOSIS — D649 Anemia, unspecified: Secondary | ICD-10-CM

## 2013-09-13 DIAGNOSIS — M899 Disorder of bone, unspecified: Secondary | ICD-10-CM

## 2013-09-13 DIAGNOSIS — N189 Chronic kidney disease, unspecified: Secondary | ICD-10-CM

## 2013-09-13 LAB — COMPREHENSIVE METABOLIC PANEL (CC13)
ALT: 22 U/L (ref 0–55)
AST: 21 U/L (ref 5–34)
Albumin: 3.7 g/dL (ref 3.5–5.0)
Alkaline Phosphatase: 100 U/L (ref 40–150)
Anion Gap: 12 mEq/L — ABNORMAL HIGH (ref 3–11)
BUN: 13.3 mg/dL (ref 7.0–26.0)
CO2: 26 mEq/L (ref 22–29)
Calcium: 9.5 mg/dL (ref 8.4–10.4)
Chloride: 100 mEq/L (ref 98–109)
Creatinine: 1.1 mg/dL (ref 0.6–1.1)
Glucose: 303 mg/dl — ABNORMAL HIGH (ref 70–140)
Potassium: 3.1 mEq/L — ABNORMAL LOW (ref 3.5–5.1)
Sodium: 138 mEq/L (ref 136–145)
Total Bilirubin: 0.5 mg/dL (ref 0.20–1.20)
Total Protein: 6.5 g/dL (ref 6.4–8.3)

## 2013-09-13 LAB — CBC WITH DIFFERENTIAL/PLATELET
BASO%: 0.5 % (ref 0.0–2.0)
Basophils Absolute: 0 10*3/uL (ref 0.0–0.1)
EOS%: 4.1 % (ref 0.0–7.0)
Eosinophils Absolute: 0.2 10*3/uL (ref 0.0–0.5)
HCT: 32.7 % — ABNORMAL LOW (ref 34.8–46.6)
HGB: 10.7 g/dL — ABNORMAL LOW (ref 11.6–15.9)
LYMPH%: 37.8 % (ref 14.0–49.7)
MCH: 32.2 pg (ref 25.1–34.0)
MCHC: 32.7 g/dL (ref 31.5–36.0)
MCV: 98.5 fL (ref 79.5–101.0)
MONO#: 0.3 10*3/uL (ref 0.1–0.9)
MONO%: 7.5 % (ref 0.0–14.0)
NEUT#: 2.1 10*3/uL (ref 1.5–6.5)
NEUT%: 50.1 % (ref 38.4–76.8)
Platelets: 122 10*3/uL — ABNORMAL LOW (ref 145–400)
RBC: 3.32 10*6/uL — ABNORMAL LOW (ref 3.70–5.45)
RDW: 13.4 % (ref 11.2–14.5)
WBC: 4.1 10*3/uL (ref 3.9–10.3)
lymph#: 1.6 10*3/uL (ref 0.9–3.3)
nRBC: 0 % (ref 0–0)

## 2013-09-13 LAB — VITAMIN B12: Vitamin B-12: 578 pg/mL (ref 211–911)

## 2013-09-13 MED ORDER — SODIUM CHLORIDE 0.9 % IV SOLN
Freq: Once | INTRAVENOUS | Status: AC
  Administered 2013-09-13: 13:00:00 via INTRAVENOUS

## 2013-09-13 MED ORDER — ZOLEDRONIC ACID 4 MG/100ML IV SOLN
4.0000 mg | Freq: Once | INTRAVENOUS | Status: AC
  Administered 2013-09-13: 4 mg via INTRAVENOUS
  Filled 2013-09-13: qty 100

## 2013-09-13 NOTE — Patient Instructions (Signed)

## 2013-09-13 NOTE — Telephone Encounter (Signed)
, °

## 2013-09-13 NOTE — Progress Notes (Signed)
ID: [name] OB: 05/24/1932  MR#: [id number]  KAJ#:[id number]  PCP: Horton Finer, MD GYN:   SU: Rolm Bookbinder OTHER MD: Floyde Parkins, Earle Gell,  CHIEF COMPLAINT:  Right Breast Cancer   HISTORY OF PRESENT ILLNESS: Rebecca Hall had routine screening mammography at [facility] 03/04/2013 showing a possible mass in the right breast, measuring 4 mm in diameter. Additional views 03/10/2013 confirmed an area of asymmetry in the right breast which was hypoechoic by ultrasonography. Biopsy of this area 03/15/2013 showed ([id number]) and invasive ductal carcinoma, grade 1 or 2, estrogen receptor 100% positive, progesterone receptor 100% positive, with a proliferation marker of 30% and HER-2 amplification by CISH with a ratio of 3.51 and an average HER-2 copy number of 6.85.  On 04/05/2013 the patient underwent bilateral breast MRIs. This showed in the right breast an 8 mm mass at the site of the known biopsy site, with surrounding known masslike enhancement. The entire area of concern measured 1.9 cm. There were no abnormal appearing lymph nodes of concern. In the left breast there were 2 areas of clumped non-masslike enhancement measuring 0.5 cm and 2.4 cm.  The patient's subsequent history is as detailed below  INTERVAL HISTORY: Rebecca Hall returns alone today (Her 82th birthday!) for followup of her right  breast cancer.   As a brief recap, she continues on anastrozole which she started 05/08/2013, and she has been receiving trastuzumab every 3 weeks, most recently given on 07/12/2013. She status post 3 of 9 planned q. three-week doses of trastuzumab.  When seen here earlier this week, Rebecca Hall had multiple concerns. She had developed a rash on her inner thighs, and had also developed a rash on her trunk area. She used a topical antifungal cream on her thighs, and that rash has improved significantly.  She was unsure when the rash on her trunk first appeared, whether it was after starting the  anastrozole or after introducing the trastuzumab. She was given her trastuzumab infusion Monday and is here to reassess the rash. There is no doubt that after her infusion on Monday, the rash has worsened. It is more "angry" and erythematous in appearance. It is slightly itchy, but "mostly aggravating" to [name]. There are no lesions or vesicles noted.  I also increased her on gabapentin last week to 300 mg nightly instead of 200 milligrams for hot flashes and insomnia. She's tolerating this dose well. She does feel like it has helped her.  She is having fewer hot flashes and is sleeping better and it has also helped decrease her pre-existing neuropathy.  Rebecca Hall and her husband had hoped to leave this coming Monday, January 26, to go to [place] for at least 1 month. Unfortunately, she tells me their RV is broken down, and they may have to cancel the trip.   REVIEW OF SYSTEMS: Rebecca Hall denies any recent fevers or illnesses. Her energy level is still low. She denies any abnormal bleeding. Her appetite is fair, but fortunately she is having no problems with nausea or emesis. She does have intermittent diarrhea which is not unusual for her due to a history of IBS.   She's had no change in urinary habits.  She has some shortness of breath with exertion which is stable. She currently denies any cough, phlegm production, orthopnea, peripheral swelling, chest pain, or palpitations. An echocardiogram earlier this week showed an ejection fraction of 55-60%. She has chronic back pain and joint pain, also stable. She has occasional headaches, but no significant change in vision and  no recent problems with dizziness.  A detailed review of systems is otherwise stable and noncontributory.   PAST MEDICAL HISTORY: Past Medical History  Diagnosis Date  . Hyperlipidemia   . Hypertension   . Chronic kidney disease   . Osteoporosis   . GERD (gastroesophageal reflux disease)   . IBS (irritable bowel syndrome)   . Arthritis    . Neuromuscular disorder     peripheral neuropathy feet  . History of lower GI bleeding   . Breast cancer 03/15/13    right, 12 o'clock    PAST SURGICAL HISTORY: Past Surgical History  Procedure Laterality Date  . Cataract extraction  2009    both  . Skin cancer excision      head, face, hand ..multiple years  . Abdominal hysterectomy  1975    No salpingo-oophorectomy  . Tonsillectomy    . Colonoscopy    . Breast lumpectomy with needle localization and axillary sentinel lymph node bx Right 04/28/2013    Procedure: BREAST LUMPECTOMY WITH NEEDLE LOCALIZATION AND AXILLARY SENTINEL LYMPH NODE BX;  Surgeon: Rolm Bookbinder, MD;  Location: [facility];  Service: General;  Laterality: Right;    FAMILY HISTORY Family History  Problem Relation Age of Onset  . Cancer Maternal Aunt 80    colon cancer  . Cancer Maternal Uncle 65    lung  . Cancer Maternal Grandmother     pancreas   the patient's father died at the age of 33 from a subarachnoid hemorrhage. The patient's mother lived to be and 25.30 years old. She lived independently to be and. The patient had one brother, no sisters. There is a history of colon cancer or in a maternal aunts, diagnosed at age 66, and lung cancer in a maternal uncle diagnosed at age 84. There is no history of breast or ovarian cancer in the family  GYNECOLOGIC HISTORY:  (updated December 2000 410)  Rebecca Hall can't remember when she had her first period. First live birth came at age 39 and she is [place] 2. She she took hormone replacement, namely estrogen, until 2014 when she was diagnosed with breast cancer.  SOCIAL HISTORY:  (Updated December 2014. 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 Her husband Rebecca Hall is retired, he is largely disabled secondary to back problems following a fall. Daughter Rebecca Hall is a retired Pharmacist, hospital living in [place]. Rebecca Hall's older son runs a Engineer, civil (consulting) and business in  [place] and Rebecca Hall helps out with that. The patient's son Rebecca Hall lives in [place]. He is an Chief Financial Officer. Altogether Alleene has 4 grandsons, 3 great-grandsons and one great-granddaughter    ADVANCED DIRECTIVES: In place   HEALTH MAINTENANCE:  (Updated January 2015) History  Substance Use Topics  . Smoking status: Never Smoker   . Smokeless tobacco: Never Used  . Alcohol Use: No     Colonoscopy: 2011  PAP: Status post hysterectomy  Bone density:Dec 2014, [facility], osteopenia with T   score of -1.9  Lipid panel: Not on file/Dr. Maxwell Caul   Allergies  Allergen Reactions  . Levaquin [Levofloxacin In D5w] Anaphylaxis and Nausea Only    Shaking real bad on medicaton  . Herceptin [Trastuzumab] Rash    Patient has developed extensive body skin rash following each Herceptin treatment of increased presence following each treatment  that cleared with oral steroids  . Codeine Nausea And Vomiting    Pass out  . Sulfa Antibiotics Swelling  Current Outpatient Prescriptions  Medication Sig Dispense Refill  . allopurinol (ZYLOPRIM) 100 MG tablet Take 100 mg by mouth daily.       Marland Kitchen allopurinol (ZYLOPRIM) 300 MG tablet Take 300 mg by mouth daily.      Marland Kitchen anastrozole (ARIMIDEX) 1 MG tablet Take 1 tablet (1 mg total) by mouth daily.  90 tablet  3  . aspirin 325 MG tablet Take 325 mg by mouth daily.      Marland Kitchen atorvastatin (LIPITOR) 20 MG tablet Take 20 mg by mouth daily.       . calcium carbonate (OS-CAL) 600 MG TABS tablet Take 600 mg by mouth 2 (two) times daily with a meal.      . diphenhydramine-acetaminophen (TYLENOL PM) 25-500 MG TABS Take 1 tablet by mouth at bedtime as needed.      . furosemide (LASIX) 40 MG tablet Take 40 mg by mouth daily.       Marland Kitchen gabapentin (NEURONTIN) 300 MG capsule Take 1 capsule (300 mg total) by mouth at bedtime.  90 capsule  2  . HYDROcodone-acetaminophen (NORCO/VICODIN) 5-325 MG per tablet Take 1 tablet by mouth every 4 (four) hours as needed.  20 tablet  0   . lisinopril (PRINIVIL,ZESTRIL) 20 MG tablet Take 20 mg by mouth daily.      Marland Kitchen LORazepam (ATIVAN) 0.5 MG tablet Take 1 tablet (0.5 mg total) by mouth 2 (two) times daily as needed for anxiety.  30 tablet  0  . methylPREDNIsolone (MEDROL DOSPACK) 4 MG tablet follow package directions  21 tablet  0  . minoxidil (ROGAINE) 2 % external solution Apply topically 2 (two) times daily.      . Multiple Vitamin (MULTIVITAMIN WITH MINERALS) TABS tablet Take 1 tablet by mouth daily.      Marland Kitchen omeprazole (PRILOSEC) 20 MG capsule       . potassium chloride SA (K-DUR,KLOR-CON) 20 MEQ tablet Take 1 tablet (20 mEq total) by mouth daily.  90 tablet  12  . Vitamin D, Ergocalciferol, (DRISDOL) 50000 UNITS CAPS capsule Take 50,000 Units by mouth.       No current facility-administered medications for this visit.    OBJECTIVE: Older white woman in no acute distress Filed Vitals:   09/13/13 1139  BP: 164/71  Pulse: 75  Temp: 98 F (36.7 C)  Resp: 18     Body mass index is 27.45 kg/(m^2).    ECOG FS:1 - Symptomatic but completely ambulatory Filed Weights   09/13/13 1139  Weight: 160 lb (72.576 kg)   Physical Exam: HEENT:  Sclerae anicteric.  Oropharynx clear and moist.  NODES:  No cervical or supraclavicular lymphadenopathy palpated.  BREAST EXAM:  Deferred. Axillae are benign bilaterally, no palpable lymphadenopathy. LUNGS:  Clear to auscultation bilaterally.  No wheezes or rhonchi HEART:  Regular rate and rhythm. No murmur  ABDOMEN:  Soft, nontender.  Positive bowel sounds.  SKIN:  There is a maculopapular erythematous rash diffusely on the trunk, more so anteriorly than posteriorly. Is definitely more erythematous and more widespread than when examined earlier this week prior to receiving trastuzumab on 07/12/2013. There no vesicles or pustules noted. NEURO:  Nonfocal. Well oriented.  Appropriate affect.    LAB RESULTS:   Lab Results  Component Value Date   WBC 4.1 09/13/2013   NEUTROABS 2.1  09/13/2013   HGB 10.7* 09/13/2013   HCT 32.7* 09/13/2013   MCV 98.5 09/13/2013   PLT 122* 09/13/2013      Chemistry  Component Value Date/Time   NA 141 07/12/2013 1311   K 4.2 07/12/2013 1311   CO2 25 07/12/2013 1311   BUN 19.2 07/12/2013 1311   CREATININE 1.1 07/12/2013 1311      Component Value Date/Time   CALCIUM 9.5 07/12/2013 1311   ALKPHOS 104 07/12/2013 1311   AST 26 07/12/2013 1311   ALT 31 07/12/2013 1311   BILITOT 0.46 07/12/2013 1311       STUDIES:  Most recent echocardiogram on 07/14/2013 showed an ejection fraction of 55-60%.  Patient tells me she had a bone density test at [facility] (Dr. Nancee Liter office) in December 2014. This showed osteopenia with a T score of -1.9.  ASSESSMENT: 80 y.o. [place] woman, status post right breast biopsy 03/15/2013 for a clinical T1c N0, stage IA invasive ductal carcinoma, grade 1 or 2, estrogen receptor 100% positive, progesterone receptor 100% positive, with an MIB-1 of 30%, and HER-2 amplified by CISH, with a ratio of 3.51 and an average HER-2 copy number Damita Lack of 6.85  (1) biopsy of 2 suspicious areas in the left breast both showed no evidence of malignancy  (2) right lumpectomy and sentinel lymph node sampling 04/28/2013 showed a pT1b pN0, stage IA invasive ductal carcinoma, grade 2, with negative margins  (3) trastuzumab started 05/31/2013, to be given every 3 weeks for total of 9 doses; being held as of 07/16/2013 after 3 doses because of possible allergic dermatitis associated with the drug.  (4) anastrozole started 05/08/2013  (5) osteopenia documented by bone density in December 2014, with T score -1.9; zolendronate started 09/13/2013, to be given every 6 months for the next 2 years then reassess  (6)  Will not need radiation therapy.   PLAN: We spent approximately 40 minutes today discussing [place] situation. In general, HER-2 positive tumors treated only with anti-estrogens do not do as well as those will receive anti-HER-2  treatment as well. However she is 80 years old, her chance of dying from this tumor in any case is very low, and she had very poor tolerance of the first 3 doses of or trastuzumab.  We consider doing pertuzumab, but I really do not have data in the setting for this drug by itself and she may i will have the same reaction or perhaps any worse reaction as with trastuzumab.  Accordingly after much discussion we decided not to continue anti-HER-2 treatment. Instead we are going to do zolendronate, which has been shown to reduce recurrence in postmenopausal women when given every 6 months. She has a good understanding of the possible toxicities, side effects and complications of this agent. She will receive her first dose today  Drina is a good understanding of this plan. She agrees with that. She will see [place] again in 3 months and then 6 months from now when she will receive her second dose of zolendronate. She will call with any problems that may develop before then.  Chauncey Cruel, MD   09/13/2013 11:51 AM

## 2013-10-04 ENCOUNTER — Ambulatory Visit: Payer: Medicare Other

## 2013-11-12 ENCOUNTER — Encounter (INDEPENDENT_AMBULATORY_CARE_PROVIDER_SITE_OTHER): Payer: Self-pay | Admitting: General Surgery

## 2013-11-12 ENCOUNTER — Ambulatory Visit (INDEPENDENT_AMBULATORY_CARE_PROVIDER_SITE_OTHER): Payer: Medicare Other | Admitting: General Surgery

## 2013-11-12 VITALS — BP 158/70 | Resp 18 | Ht 61.0 in | Wt 161.0 lb

## 2013-11-12 DIAGNOSIS — C50419 Malignant neoplasm of upper-outer quadrant of unspecified female breast: Secondary | ICD-10-CM

## 2013-11-12 DIAGNOSIS — C50411 Malignant neoplasm of upper-outer quadrant of right female breast: Secondary | ICD-10-CM

## 2013-11-17 NOTE — Progress Notes (Signed)
Subjective:     Patient ID: Rebecca Hall, female   DOB: [birth year], 80 y.o.   MRN: [id number]  HPI This is a 80 year old female who underwent a right breast lumpectomy and sentinel node biopsy 11/14. This was a grade II IDC, 1 cm associated with int grade DCIS, margin negative giving her a stage I breast cancer.   She has been on anastrozole since 11/14 and is tolerating this fairly well. She is on gabapentin to manage hot flashes and has done better with this. She was on herceptin for her2 amplified tumor but this has been stopped due to possible allergic dermatitis. She is now on zolendronate to reduce recurrence. She has no real complaints at our visit today.  Review of Systems     Objective:   Physical Exam  Vitals reviewed. Constitutional: She appears well-developed and well-nourished.  Neck: Neck supple.  Pulmonary/Chest: Right breast exhibits no inverted nipple, no mass, no nipple discharge and no skin change. Left breast exhibits no inverted nipple, no mass, no nipple discharge and no skin change.  Lymphadenopathy:    She has no cervical adenopathy.    She has no axillary adenopathy.       Right: No supraclavicular adenopathy present.       Left: No supraclavicular adenopathy present.       Assessment:     Stage I right breast cancer     Plan:     She has no clinical evidence of recurrence. She is due her mm in November. Will continue sbe.  Will see Dr Jana Hakim in June for follow up also. I can see back in one year and will be happy to split visit with Dr Jana Hakim.

## 2013-11-24 ENCOUNTER — Encounter: Payer: Self-pay | Admitting: *Deleted

## 2013-11-24 ENCOUNTER — Encounter: Payer: Medicare Other | Attending: Internal Medicine | Admitting: *Deleted

## 2013-11-24 VITALS — Ht 61.0 in | Wt 162.5 lb

## 2013-11-24 DIAGNOSIS — E119 Type 2 diabetes mellitus without complications: Secondary | ICD-10-CM | POA: Insufficient documentation

## 2013-11-24 DIAGNOSIS — Z713 Dietary counseling and surveillance: Secondary | ICD-10-CM | POA: Insufficient documentation

## 2013-11-24 NOTE — Progress Notes (Signed)
Appt start time: 1030 end time:  1200.  Assessment:  Patient was seen on  11/24/2013 for individual diabetes education. She is newly diagnosed with diabetes and has not actually seen her MD since she was told she has it. She came with her husband who appears supportive. She explained her complicated medical history and the many medications she is on.  She states she likes to walk but is limited due to neuropathy in her feet and gout.   Current HbA1c: 9.6%  Preferred Learning Style:   No preference indicated   Learning Readiness:   Ready  Change in progress  MEDICATIONS: see list. Diabetes medication is Glimepiride at this time.  DIETARY INTAKE:  24-hr recall:  B ( AM): used to skip, now cereal without milk, or toast with oleo and cinammon sugar, 10 - 12 oz regular coke Snk ( AM): no  L ( PM): skips often, drinks water all day Snk ( PM): handful of peanuts or watermelon D (5:30 to 7 PM): sandwich, deviled egg, vegetables, OR small portions of lean meat, always vegetables, OR occasionally fast food, water Snk ( PM): share a cookie or peanut butter bar Beverages: regular coke, water  Usual physical activity: likes to walk, limited due to neuropathy and gout pain  Estimated energy needs: 1200 calories 135 g carbohydrates 90 g protein 33 g fat  Progress Towards Goal(s):  In progress.   Nutritional Diagnosis:  NB-1.1 Food and nutrition-related knowledge deficit As related to diabetes control.  As evidenced by A1c of 9.6%.    Intervention:  Nutrition counseling provided.  Discussed diabetes disease process and treatment options.  Discussed physiology of diabetes and role of obesity on insulin resistance.  Encouraged moderate weight reduction to improve glucose levels.  Discussed role of medications and diet in glucose control  Provided education on macronutrients on glucose levels.  Provided education on carb counting, importance of regularly scheduled meals/snacks, and meal  planning  Discussed effects of physical activity on glucose levels and long-term glucose control.  Recommended she consider Arm Chair Exercises as part of physical activity/week.  Reviewed patient medications.  Discussed role of medication on blood glucose and possible side effects.   Plan to discuss blood glucose monitoring and interpretation at next visit.  Will provide recommended target ranges and individual ranges then.    Described short-term complications: hyper- and hypo-glycemia.  Discussed causes,symptoms, and treatment options.  Discussed role of stress on blood glucose levels and discussed strategies to manage psychosocial issues.  Discussed recommendations for long-term diabetes self-care.  Provided checklist for medical, dental, and emotional self-care.  Plan:  Aim for 2 Carb Choices per meal (30 grams) +/- 1 either way  Aim for 0-1 Carbs per snack if hungry  Include protein in moderation with your meals and snacks Consider asking your MD about getting a meter if you want to start checking BG Consider asking your MD about your diabetes medication options  Teaching Method Utilized: Visual, Auditory and Hands on  Handouts given during visit include: Living Well with Diabetes Carb Counting and Food Label handouts Meal Plan Card Diabetes Medications Handout  Barriers to learning/adherence to lifestyle change: complicated and serious medical history in addition to new diagnosis of diabetes  Diabetes self-care support plan:   [facility] support group available  Demonstrated degree of understanding via:  Teach Back   Monitoring/Evaluation:  Dietary intake, exercise, reading food labels, and body weight PRN

## 2013-11-24 NOTE — Patient Instructions (Signed)
Plan:  Aim for 2 Carb Choices per meal (30 grams) +/- 1 either way  Aim for 0-1 Carbs per snack if hungry  Include protein in moderation with your meals and snacks Consider asking your MD about getting a meter if you want to start checking BG Consider asking your MD about your diabetes medication options

## 2013-12-13 ENCOUNTER — Telehealth: Payer: Self-pay | Admitting: *Deleted

## 2013-12-13 NOTE — Telephone Encounter (Signed)
Called pt and confirmed appt w/ Covering Provider 2 for 6/23 w/ her.  She states that she has a new diagnosis of diabetes and could like for me to get that information for Dr. Raliegh Ip and make sure her sugar is checked tomorrow as well.  Called and left a message for Rebecca Hall in Med Rec at Dr. Nancee Liter office requesting for her to fax office notes and labs to me.  Went and spoke with Rebecca Hall and she states that her Glucose would be added in with the blood draw that we do.

## 2013-12-14 ENCOUNTER — Other Ambulatory Visit (HOSPITAL_BASED_OUTPATIENT_CLINIC_OR_DEPARTMENT_OTHER): Payer: Medicare Other

## 2013-12-14 ENCOUNTER — Telehealth: Payer: Self-pay | Admitting: Oncology

## 2013-12-14 ENCOUNTER — Ambulatory Visit (HOSPITAL_BASED_OUTPATIENT_CLINIC_OR_DEPARTMENT_OTHER): Payer: Medicare Other | Admitting: Hematology and Oncology

## 2013-12-14 VITALS — BP 135/70 | HR 79 | Temp 98.1°F | Resp 20 | Ht 61.0 in | Wt 163.1 lb

## 2013-12-14 DIAGNOSIS — C50919 Malignant neoplasm of unspecified site of unspecified female breast: Secondary | ICD-10-CM

## 2013-12-14 DIAGNOSIS — N959 Unspecified menopausal and perimenopausal disorder: Secondary | ICD-10-CM

## 2013-12-14 DIAGNOSIS — Z170 Estrogen receptor positive status [ER+]: Secondary | ICD-10-CM

## 2013-12-14 DIAGNOSIS — M949 Disorder of cartilage, unspecified: Secondary | ICD-10-CM

## 2013-12-14 DIAGNOSIS — M899 Disorder of bone, unspecified: Secondary | ICD-10-CM

## 2013-12-14 DIAGNOSIS — C50419 Malignant neoplasm of upper-outer quadrant of unspecified female breast: Secondary | ICD-10-CM

## 2013-12-14 DIAGNOSIS — C50411 Malignant neoplasm of upper-outer quadrant of right female breast: Secondary | ICD-10-CM

## 2013-12-14 DIAGNOSIS — Z79811 Long term (current) use of aromatase inhibitors: Secondary | ICD-10-CM

## 2013-12-14 LAB — CBC WITH DIFFERENTIAL/PLATELET
BASO%: 0.2 % (ref 0.0–2.0)
Basophils Absolute: 0 10*3/uL (ref 0.0–0.1)
EOS%: 3.2 % (ref 0.0–7.0)
Eosinophils Absolute: 0.2 10*3/uL (ref 0.0–0.5)
HCT: 32.8 % — ABNORMAL LOW (ref 34.8–46.6)
HGB: 10.6 g/dL — ABNORMAL LOW (ref 11.6–15.9)
LYMPH%: 37.8 % (ref 14.0–49.7)
MCH: 31.2 pg (ref 25.1–34.0)
MCHC: 32.3 g/dL (ref 31.5–36.0)
MCV: 96.5 fL (ref 79.5–101.0)
MONO#: 0.5 10*3/uL (ref 0.1–0.9)
MONO%: 10.4 % (ref 0.0–14.0)
NEUT#: 2.2 10*3/uL (ref 1.5–6.5)
NEUT%: 48.4 % (ref 38.4–76.8)
Platelets: 137 10*3/uL — ABNORMAL LOW (ref 145–400)
RBC: 3.4 10*6/uL — ABNORMAL LOW (ref 3.70–5.45)
RDW: 14.5 % (ref 11.2–14.5)
WBC: 4.6 10*3/uL (ref 3.9–10.3)
lymph#: 1.8 10*3/uL (ref 0.9–3.3)

## 2013-12-14 NOTE — Telephone Encounter (Signed)
[facility] APPT SCHEDULE FOR JUNE THRU AUG. PER [facility] PT NEEDS LAB BEFORE 7/23 - NO REFFERAL MADE OR NEEDED.  °

## 2013-12-18 NOTE — Progress Notes (Signed)
ID: [name] OB: 04/16/1932  MR#: [id number]  QQI#:[id number]  PCP: Horton Finer, MD GYN:   SU: Rolm Bookbinder OTHER MD: Floyde Parkins, Earle Gell,  CHIEF COMPLAINT:  Right Breast Cancer   HISTORY OF PRESENT ILLNESS: From original intake note: Avah had routine screening mammography at [facility] 03/04/2013 showing a possible mass in the right breast, measuring 4 mm in diameter. Additional views 03/10/2013 confirmed an area of asymmetry in the right breast which was hypoechoic by ultrasonography. Biopsy of this area 03/15/2013 showed ([id number]) and invasive ductal carcinoma, grade 1 or 2, estrogen receptor 100% positive, progesterone receptor 100% positive, with a proliferation marker of 30% and HER-2 amplification by CISH with a ratio of 3.51 and an average HER-2 copy number of 6.85.  On 04/05/2013 the patient underwent bilateral breast MRIs. This showed in the right breast an 8 mm mass at the site of the known biopsy site, with surrounding known masslike enhancement. The entire area of concern measured 1.9 cm. There were no abnormal appearing lymph nodes of concern. In the left breast there were 2 areas of clumped non-masslike enhancement measuring 0.5 cm and 2.4 cm.  The patient's subsequent history is as detailed below  INTERVAL HISTORY: Melisia returns  today for followup visit for her breast cancer. She continued to take anastrozole. She does complain of hot flashes and joint pains. She says gabapentin helps for hot flashes.  She also says gabapentin is also helping for her neuropathy. Last mammogram was performed in November 2014 . DEXA scan was done in December 2014 that revealed osteopenia.  REVIEW OF SYSTEMS:  She denies any shortness of breath, chest pain , palpitations, blood in the stool or blood in the urine. She complains of intermittent diarrhea that she attributes to irritable bowel syndrome. She denies any headaches, dizziness ,blurred vision She denies any  vaginal bleeding, cough, fever or chills. Her appetite is good and weight is stable. A detailed review of systems is otherwise stable and noncontributory.   PAST MEDICAL HISTORY: Past Medical History  Diagnosis Date  . Hyperlipidemia   . Hypertension   . Chronic kidney disease   . Osteoporosis   . GERD (gastroesophageal reflux disease)   . IBS (irritable bowel syndrome)   . Arthritis   . Neuromuscular disorder     peripheral neuropathy feet  . History of lower GI bleeding   . Breast cancer 03/15/13    right, 12 o'clock  . Diabetes mellitus without complication     PAST SURGICAL HISTORY: Past Surgical History  Procedure Laterality Date  . Cataract extraction  2009    both  . Skin cancer excision      head, face, hand ..multiple years  . Abdominal hysterectomy  1975    No salpingo-oophorectomy  . Tonsillectomy    . Colonoscopy    . Breast lumpectomy with needle localization and axillary sentinel lymph node bx Right 04/28/2013    Procedure: BREAST LUMPECTOMY WITH NEEDLE LOCALIZATION AND AXILLARY SENTINEL LYMPH NODE BX;  Surgeon: Rolm Bookbinder, MD;  Location: [facility];  Service: General;  Laterality: Right;    FAMILY HISTORY Family History  Problem Relation Age of Onset  . Cancer Maternal Aunt 80    colon cancer  . Cancer Maternal Uncle 65    lung  . Cancer Maternal Grandmother     pancreas   the patient's father died at the age of 32 from a subarachnoid hemorrhage. The patient's mother lived to be and 101.5 years  old. She lived independently to be and. The patient had one brother, no sisters. There is a history of colon cancer or in a maternal aunts, diagnosed at age 37, and lung cancer in a maternal uncle diagnosed at age 23. There is no history of breast or ovarian cancer in the family  GYNECOLOGIC HISTORY:  (updated December 2000 410)  Laraine can't remember when she had her first period. First live birth came at age 39 and she is [place]. She she took  hormone replacement, namely estrogen, until 2014 when she was diagnosed with breast cancer.  SOCIAL HISTORY:  (Updated December 2014. 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 Her husband [name] is retired, he is largely disabled secondary to back problems following a fall. Daughter Alica Shellhammer is a retired Pharmacist, hospital living in [place]. [name] older son runs a Engineer, civil (consulting) and business in [place] and [name] helps out with that. The patient's son [name] lives in [place]. He is an Chief Financial Officer. Altogether Eboni has 4 grandsons, 3 great-grandsons and one great-granddaughter    ADVANCED DIRECTIVES: In place   HEALTH MAINTENANCE:  (Updated January 2015) History  Substance Use Topics  . Smoking status: Never Smoker   . Smokeless tobacco: Never Used  . Alcohol Use: No     Colonoscopy: 2011  PAP: Status post hysterectomy  Bone density:Dec 2014, [place], osteopenia with T   score of -1.9  Lipid panel: Not on file/Dr. Maxwell Caul   Allergies  Allergen Reactions  . Levaquin [Levofloxacin In D5w] Anaphylaxis and Nausea Only    Shaking real bad on medicaton  . Herceptin [Trastuzumab] Rash    Patient has developed extensive body skin rash following each Herceptin treatment of increased presence following each treatment  that cleared with oral steroids  . Codeine Nausea And Vomiting    Pass out  . Sulfa Antibiotics Swelling    Current Outpatient Prescriptions  Medication Sig Dispense Refill  . allopurinol (ZYLOPRIM) 100 MG tablet Take 100 mg by mouth daily.       Marland Kitchen allopurinol (ZYLOPRIM) 300 MG tablet Take 300 mg by mouth daily.      Marland Kitchen anastrozole (ARIMIDEX) 1 MG tablet Take 1 tablet (1 mg total) by mouth daily.  90 tablet  3  . aspirin 325 MG tablet Take 325 mg by mouth daily.      Marland Kitchen atorvastatin (LIPITOR) 20 MG tablet Take 20 mg by mouth daily.       . calcium carbonate (OS-CAL) 600 MG TABS tablet Take 600 mg by mouth 2 (two) times  daily with a meal.      . diphenhydramine-acetaminophen (TYLENOL PM) 25-500 MG TABS Take 1 tablet by mouth at bedtime as needed.      . furosemide (LASIX) 40 MG tablet Take 40 mg by mouth daily.       Marland Kitchen gabapentin (NEURONTIN) 300 MG capsule Take 1 capsule (300 mg total) by mouth at bedtime.  90 capsule  2  . glimepiride (AMARYL) 1 MG tablet Take 1 mg by mouth daily with breakfast.      . lisinopril (PRINIVIL,ZESTRIL) 20 MG tablet Take 20 mg by mouth daily.      Marland Kitchen LORazepam (ATIVAN) 0.5 MG tablet Take 1 tablet (0.5 mg total) by mouth 2 (two) times daily as needed for anxiety.  30 tablet  0  . methylPREDNIsolone (MEDROL DOSPACK) 4 MG tablet follow package directions  21 tablet  0  . minoxidil (  ROGAINE) 2 % external solution Apply topically 2 (two) times daily.      . Multiple Vitamin (MULTIVITAMIN WITH MINERALS) TABS tablet Take 1 tablet by mouth daily.      Marland Kitchen omeprazole (PRILOSEC) 20 MG capsule       . potassium chloride SA (K-DUR,KLOR-CON) 20 MEQ tablet Take 1 tablet (20 mEq total) by mouth daily.  90 tablet  12  . predniSONE (DELTASONE) 5 MG tablet       . Vitamin D, Ergocalciferol, (DRISDOL) 50000 UNITS CAPS capsule Take 50,000 Units by mouth.      . Zoledronic Acid (ZOMETA) 4 MG/100ML IVPB Inject 4 mg into the vein.       No current facility-administered medications for this visit.    OBJECTIVE: Older white woman in no acute distress Filed Vitals:   12/14/13 1316  BP: 135/70  Pulse: 79  Temp: 98.1 F (36.7 C)  Resp: 20     Body mass index is 30.83 kg/(m^2).    ECOG FS:1 - Symptomatic but completely ambulatory Filed Weights   12/14/13 1316  Weight: 163 lb 1.6 oz (73.982 kg)   Physical Exam: HEENT:  Sclerae anicteric.  Oropharynx clear and moist.  NODES:  No cervical or supraclavicular lymphadenopathy palpated.  BREAST EXAM:  Right breast lumpectomy scar noted. No masses felt. Left breast no masses felt.  no bilateral axillary lymphadenopathy  LUNGS:  Clear to auscultation  bilaterally.  No wheezes or rhonchi HEART:  Regular rate and rhythm. No murmur  ABDOMEN:  Soft, nontender.  Positive bowel sounds.  SKIN:  Intact  NEURO:  Nonfocal. Well oriented.  Appropriate affect.  LAB RESULTS:   Lab Results  Component Value Date   WBC 4.6 12/14/2013   NEUTROABS 2.2 12/14/2013   HGB 10.6* 12/14/2013   HCT 32.8* 12/14/2013   MCV 96.5 12/14/2013   PLT 137* 12/14/2013      Chemistry      Component Value Date/Time   NA 138 09/13/2013 1121   K 3.1* 09/13/2013 1121   CO2 26 09/13/2013 1121   BUN 13.3 09/13/2013 1121   CREATININE 1.1 09/13/2013 1121      Component Value Date/Time   CALCIUM 9.5 09/13/2013 1121   ALKPHOS 100 09/13/2013 1121   AST 21 09/13/2013 1121   ALT 22 09/13/2013 1121   BILITOT 0.50 09/13/2013 1121       STUDIES:  Most recent echocardiogram on 07/14/2013 showed an ejection fraction of 55-60%.  Patient tells me she had a bone density test at [facility] (Dr. Nancee Liter office) in December 2014. This showed osteopenia with a T score of -1.9.  ASSESSMENT: 80 y.o. [place] woman, status post right breast biopsy 03/15/2013 for a clinical T1c N0, stage IA invasive ductal carcinoma, grade 1 or 2, estrogen receptor 100% positive, progesterone receptor 100% positive, with an MIB-1 of 30%, and HER-2 amplified by CISH, with a ratio of 3.51 and an average HER-2 copy number Damita Lack of 6.85  (1) biopsy of 2 suspicious areas in the left breast both showed no evidence of malignancy  (2) right lumpectomy and sentinel lymph node sampling 04/28/2013 showed a pT1b pN0, stage IA invasive ductal carcinoma, grade 2, with negative margins  (3) trastuzumab started 05/31/2013, to be given every 3 weeks for total of 9 doses; being held as of 07/16/2013 after 3 doses because of possible allergic dermatitis associated with the drug.   (4) anastrozole started 05/08/2013  (5) osteopenia documented by bone density in December 2014, with T score -  1.9; zolendronate started  09/13/2013, to be given every 6 months for the next 2 years then reassess  (6)  Will not need radiation therapy.   PLAN: Sumiye is tolerating anastrozole quite well with minimal side effects of hot flushes and joint pains and she is on gabapentin for hot flashes. Continue anastrozole 1 mg by mouth once daily.   I have reviewed her CBC today and it shows anemia and it is stable when compared to the previous visit. No CMP was performed today. We'll obtain CMP lab results from the primary care physician. I have asked the patient to follow up with primary care physician for further workup of her anemia  We'll schedule for her annual digital diagnostic mammogram in November 2015.  Patient has been scheduled for Zometa infusion in September 2015 that she has been getting once every 6 months s starting from March 2015. Continue calcium with vitamin D supplementation  Next followup visit for Zometa infusion in September 2015, next office visit in 6 months. We'll also check renal functions prior to her Zometa infusion   Juliannah is in agreement with the current plan of care .She will call with any problems that may develop before her next visit  Wilmon Arms, MD   Medical oncology  12/18/2013 1:59 PM

## 2014-01-05 ENCOUNTER — Telehealth: Payer: Self-pay | Admitting: *Deleted

## 2014-01-05 NOTE — Telephone Encounter (Signed)
Rebecca Hall left a message stating she needs something for sleep " especially since I have been invited to stay a friend's house with my family "  " but I do not sleep well and wake up with hot flashes - which I use the gabapentin for and it overall helps " " but occasionally I am awakened by a hot flash and then cannot get back to sleep"  " Dr Maxwell Caul told me not to lay in bed for more then 20 minutes if I do not fall back to sleep but to get up and do something non engaging that would keep me awake "  " well your office gave me lorazepam but that doesn't work and the other night when I could not get back to sleep I took one of my husbands diazepam and that allowed me to go back to sleep without any problems"  " I was hoping Dr Jana Hakim would give me a prescription for the above which I would only use occasionally and especially while I am away from home and cannot get up and wander around someone else's home if I can't sleep"  This note will be given to MD for review and recommendation.   Return call number given as [phone].

## 2014-01-07 ENCOUNTER — Other Ambulatory Visit: Payer: Self-pay | Admitting: *Deleted

## 2014-01-07 MED ORDER — DIAZEPAM 5 MG PO TABS: 5.0000 mg | ORAL_TABLET | Freq: Every evening | ORAL | Status: DC | PRN

## 2014-02-16 ENCOUNTER — Other Ambulatory Visit: Payer: Self-pay | Admitting: Internal Medicine

## 2014-02-16 DIAGNOSIS — M545 Low back pain, unspecified: Secondary | ICD-10-CM

## 2014-02-23 ENCOUNTER — Ambulatory Visit
Admission: RE | Admit: 2014-02-23 | Discharge: 2014-02-23 | Disposition: A | Discharge status: Home / Self Care | Payer: Medicare Other | Source: Ambulatory Visit | Attending: Internal Medicine | Admitting: Internal Medicine

## 2014-02-23 DIAGNOSIS — M545 Low back pain, unspecified: Secondary | ICD-10-CM

## 2014-03-16 ENCOUNTER — Other Ambulatory Visit: Payer: Self-pay | Admitting: *Deleted

## 2014-03-16 ENCOUNTER — Other Ambulatory Visit (HOSPITAL_BASED_OUTPATIENT_CLINIC_OR_DEPARTMENT_OTHER): Payer: Medicare Other

## 2014-03-16 ENCOUNTER — Ambulatory Visit (HOSPITAL_BASED_OUTPATIENT_CLINIC_OR_DEPARTMENT_OTHER): Payer: Medicare Other

## 2014-03-16 DIAGNOSIS — Z23 Encounter for immunization: Secondary | ICD-10-CM

## 2014-03-16 DIAGNOSIS — C50411 Malignant neoplasm of upper-outer quadrant of right female breast: Secondary | ICD-10-CM

## 2014-03-16 DIAGNOSIS — M949 Disorder of cartilage, unspecified: Secondary | ICD-10-CM

## 2014-03-16 DIAGNOSIS — C50419 Malignant neoplasm of upper-outer quadrant of unspecified female breast: Secondary | ICD-10-CM

## 2014-03-16 DIAGNOSIS — M810 Age-related osteoporosis without current pathological fracture: Secondary | ICD-10-CM

## 2014-03-16 DIAGNOSIS — M899 Disorder of bone, unspecified: Secondary | ICD-10-CM

## 2014-03-16 LAB — COMPREHENSIVE METABOLIC PANEL (CC13)
ALT: 16 U/L (ref 0–55)
AST: 19 U/L (ref 5–34)
Albumin: 4 g/dL (ref 3.5–5.0)
Alkaline Phosphatase: 84 U/L (ref 40–150)
Anion Gap: 10 mEq/L (ref 3–11)
BUN: 24.4 mg/dL (ref 7.0–26.0)
CO2: 25 mEq/L (ref 22–29)
Calcium: 10.7 mg/dL — ABNORMAL HIGH (ref 8.4–10.4)
Chloride: 101 mEq/L (ref 98–109)
Creatinine: 1.3 mg/dL — ABNORMAL HIGH (ref 0.6–1.1)
Glucose: 218 mg/dl — ABNORMAL HIGH (ref 70–140)
Potassium: 3.6 mEq/L (ref 3.5–5.1)
Sodium: 136 mEq/L (ref 136–145)
Total Bilirubin: 0.53 mg/dL (ref 0.20–1.20)
Total Protein: 7.5 g/dL (ref 6.4–8.3)

## 2014-03-16 LAB — CBC WITH DIFFERENTIAL/PLATELET
BASO%: 0.2 % (ref 0.0–2.0)
Basophils Absolute: 0 10*3/uL (ref 0.0–0.1)
EOS%: 3.6 % (ref 0.0–7.0)
Eosinophils Absolute: 0.2 10*3/uL (ref 0.0–0.5)
HCT: 33.2 % — ABNORMAL LOW (ref 34.8–46.6)
HGB: 10.9 g/dL — ABNORMAL LOW (ref 11.6–15.9)
LYMPH%: 38.8 % (ref 14.0–49.7)
MCH: 32.1 pg (ref 25.1–34.0)
MCHC: 32.8 g/dL (ref 31.5–36.0)
MCV: 97.6 fL (ref 79.5–101.0)
MONO#: 0.4 10*3/uL (ref 0.1–0.9)
MONO%: 9.7 % (ref 0.0–14.0)
NEUT#: 2.1 10*3/uL (ref 1.5–6.5)
NEUT%: 47.7 % (ref 38.4–76.8)
Platelets: 136 10*3/uL — ABNORMAL LOW (ref 145–400)
RBC: 3.4 10*6/uL — ABNORMAL LOW (ref 3.70–5.45)
RDW: 15.5 % — ABNORMAL HIGH (ref 11.2–14.5)
WBC: 4.4 10*3/uL (ref 3.9–10.3)
lymph#: 1.7 10*3/uL (ref 0.9–3.3)
nRBC: 0 % (ref 0–0)

## 2014-03-16 MED ORDER — INFLUENZA VAC SPLIT QUAD 0.5 ML IM SUSY: 0.5000 mL | PREFILLED_SYRINGE | Freq: Once | INTRAMUSCULAR | Status: DC

## 2014-03-16 MED ORDER — PNEUMOCOCCAL VAC POLYVALENT 25 MCG/0.5ML IJ INJ: 0.5000 mL | INJECTION | Freq: Once | INTRAMUSCULAR | Status: DC

## 2014-03-16 MED ORDER — ZOLEDRONIC ACID 4 MG/100ML IV SOLN
4.0000 mg | Freq: Once | INTRAVENOUS | Status: AC
  Administered 2014-03-16: 4 mg via INTRAVENOUS
  Filled 2014-03-16: qty 100

## 2014-03-16 MED ORDER — INFLUENZA VAC SPLIT QUAD 0.5 ML IM SUSY
0.5000 mL | PREFILLED_SYRINGE | Freq: Once | INTRAMUSCULAR | Status: AC
  Administered 2014-03-16: 0.5 mL via INTRAMUSCULAR
  Filled 2014-03-16: qty 0.5

## 2014-03-16 MED ORDER — PNEUMOCOCCAL 13-VAL CONJ VACC IM SUSP
0.5000 mL | Freq: Once | INTRAMUSCULAR | Status: AC
  Administered 2014-03-16: 0.5 mL via INTRAMUSCULAR
  Filled 2014-03-16: qty 0.5

## 2014-03-16 NOTE — Progress Notes (Signed)
Patient states that she has seen a neuro surgeon, Dr Sherley Bounds, and he is setting up Rebecca Hall for "back injections" due to her back problems. Patient is inquiring if this is "ok" considering her medical hx and the zometa that she is receiving along with her other meds. Patient also concerned about "hoarseness" that she has been experiencing for about 3 months.

## 2014-04-06 ENCOUNTER — Other Ambulatory Visit: Payer: Self-pay | Admitting: *Deleted

## 2014-04-06 DIAGNOSIS — G4700 Insomnia, unspecified: Secondary | ICD-10-CM

## 2014-04-06 DIAGNOSIS — C50411 Malignant neoplasm of upper-outer quadrant of right female breast: Secondary | ICD-10-CM

## 2014-04-06 MED ORDER — GABAPENTIN 300 MG PO CAPS: 300.0000 mg | ORAL_CAPSULE | Freq: Every day | ORAL | Status: DC

## 2014-04-18 ENCOUNTER — Other Ambulatory Visit: Payer: Self-pay | Admitting: Oncology

## 2014-06-13 ENCOUNTER — Other Ambulatory Visit: Payer: Self-pay | Admitting: *Deleted

## 2014-06-13 DIAGNOSIS — C50411 Malignant neoplasm of upper-outer quadrant of right female breast: Secondary | ICD-10-CM

## 2014-06-14 ENCOUNTER — Ambulatory Visit (HOSPITAL_BASED_OUTPATIENT_CLINIC_OR_DEPARTMENT_OTHER): Payer: Medicare Other | Admitting: Oncology

## 2014-06-14 ENCOUNTER — Encounter (HOSPITAL_BASED_OUTPATIENT_CLINIC_OR_DEPARTMENT_OTHER): Payer: Medicare Other | Admitting: Oncology

## 2014-06-14 VITALS — BP 162/66 | HR 66 | Temp 98.2°F | Resp 18 | Ht 61.0 in | Wt 162.3 lb

## 2014-06-14 DIAGNOSIS — Z853 Personal history of malignant neoplasm of breast: Secondary | ICD-10-CM

## 2014-06-14 DIAGNOSIS — C50411 Malignant neoplasm of upper-outer quadrant of right female breast: Secondary | ICD-10-CM

## 2014-06-14 LAB — COMPREHENSIVE METABOLIC PANEL (CC13)
ALT: 17 U/L (ref 0–55)
AST: 18 U/L (ref 5–34)
Albumin: 3.9 g/dL (ref 3.5–5.0)
Alkaline Phosphatase: 75 U/L (ref 40–150)
Anion Gap: 11 mEq/L (ref 3–11)
BUN: 12.8 mg/dL (ref 7.0–26.0)
CO2: 30 mEq/L — ABNORMAL HIGH (ref 22–29)
Calcium: 9 mg/dL (ref 8.4–10.4)
Chloride: 99 mEq/L (ref 98–109)
Creatinine: 1.4 mg/dL — ABNORMAL HIGH (ref 0.6–1.1)
EGFR: 37 mL/min/{1.73_m2} — ABNORMAL LOW (ref >90)
Glucose: 196 mg/dl — ABNORMAL HIGH (ref 70–140)
Potassium: 2.6 mEq/L — CL (ref 3.5–5.1)
Sodium: 141 mEq/L (ref 136–145)
Total Bilirubin: 0.35 mg/dL (ref 0.20–1.20)
Total Protein: 6.7 g/dL (ref 6.4–8.3)

## 2014-06-14 LAB — CBC WITH DIFFERENTIAL/PLATELET
BASO%: 0.2 % (ref 0.0–2.0)
Basophils Absolute: 0 10*3/uL (ref 0.0–0.1)
EOS%: 3.2 % (ref 0.0–7.0)
Eosinophils Absolute: 0.2 10*3/uL (ref 0.0–0.5)
HCT: 34.2 % — ABNORMAL LOW (ref 34.8–46.6)
HGB: 11.1 g/dL — ABNORMAL LOW (ref 11.6–15.9)
LYMPH%: 36.7 % (ref 14.0–49.7)
MCH: 32.5 pg (ref 25.1–34.0)
MCHC: 32.5 g/dL (ref 31.5–36.0)
MCV: 100 fL (ref 79.5–101.0)
MONO#: 0.6 10*3/uL (ref 0.1–0.9)
MONO%: 12.6 % (ref 0.0–14.0)
NEUT#: 2.4 10*3/uL (ref 1.5–6.5)
NEUT%: 47.3 % (ref 38.4–76.8)
Platelets: 155 10*3/uL (ref 145–400)
RBC: 3.42 10*6/uL — ABNORMAL LOW (ref 3.70–5.45)
RDW: 13.3 % (ref 11.2–14.5)
WBC: 5.1 10*3/uL (ref 3.9–10.3)
lymph#: 1.9 10*3/uL (ref 0.9–3.3)

## 2014-06-14 MED ORDER — TRIAMCINOLONE ACETONIDE 0.5 % EX OINT: 1.0000 "application " | TOPICAL_OINTMENT | Freq: Two times a day (BID) | CUTANEOUS | Status: DC

## 2014-06-14 MED ORDER — POTASSIUM CHLORIDE CRYS ER 20 MEQ PO TBCR
20.0000 meq | EXTENDED_RELEASE_TABLET | Freq: Two times a day (BID) | ORAL | Status: DC
Start: 2014-06-14 — End: 2014-06-15

## 2014-06-14 MED ORDER — ANASTROZOLE 1 MG PO TABS: ORAL_TABLET | ORAL | Status: DC

## 2014-06-14 NOTE — Progress Notes (Signed)
ID: [name] OB: Apr 19, 1932  MR#: [id number]  QHU#:[id number]  PCP: Horton Finer, MD GYN:   SU: Rolm Bookbinder OTHER MD: Floyde Parkins, Earle Gell,  CHIEF COMPLAINT:  Right Breast Cancer   HISTORY OF PRESENT ILLNESS: Rebecca Hall had routine screening mammography at [facility] 03/04/2013 showing a possible mass in the right breast, measuring 4 mm in diameter. Additional views 03/10/2013 confirmed an area of asymmetry in the right breast which was hypoechoic by ultrasonography. Biopsy of this area 03/15/2013 showed ([id number]) and invasive ductal carcinoma, grade 1 or 2, estrogen receptor 100% positive, progesterone receptor 100% positive, with a proliferation marker of 30% and HER-2 amplification by CISH with a ratio of 3.51 and an average HER-2 copy number of 6.85.  On 04/05/2013 the patient underwent bilateral breast MRIs. This showed in the right breast an 8 mm mass at the site of the known biopsy site, with surrounding known masslike enhancement. The entire area of concern measured 1.9 cm. There were no abnormal appearing lymph nodes of concern. In the left breast there were 2 areas of clumped non-masslike enhancement measuring 0.5 cm and 2.4 cm.  The patient's subsequent history is as detailed below  INTERVAL HISTORY: Rebecca Hall returns alone today (Her 80th birthday!) for followup of her right  breast cancer.   As a brief recap, she continues on anastrozole which she started 05/08/2013, and she has been receiving trastuzumab every 3 weeks, most recently given on 07/12/2013. She status post 3 of 9 planned q. three-week doses of trastuzumab.  When seen here earlier this week, Rebecca Hall had multiple concerns. She had developed a rash on her inner thighs, and had also developed a rash on her trunk area. She used a topical antifungal cream on her thighs, and that rash has improved significantly.  She was unsure when the rash on her trunk first appeared, whether it was after starting the  anastrozole or after introducing the trastuzumab. She was given her trastuzumab infusion Monday and is here to reassess the rash. There is no doubt that after her infusion on Monday, the rash has worsened. It is more "angry" and erythematous in appearance. It is slightly itchy, but "mostly aggravating" to [name]. There are no lesions or vesicles noted.  I also increased her on gabapentin last week to 300 mg nightly instead of 200 milligrams for hot flashes and insomnia. She's tolerating this dose well. She does feel like it has helped her.  She is having fewer hot flashes and is sleeping better and it has also helped decrease her pre-existing neuropathy.  [name] and her husband had hoped to leave this coming Monday, January 26, to go to [place] for at least 1 month. Unfortunately, she tells me their RV is broken down, and they may have to cancel the trip.   REVIEW OF SYSTEMS: Rebecca Hall denies any recent fevers or illnesses. Her energy level is still low. She denies any abnormal bleeding. Her appetite is fair, but fortunately she is having no problems with nausea or emesis. She does have intermittent diarrhea which is not unusual for her due to a history of IBS.   She's had no change in urinary habits.  She has some shortness of breath with exertion which is stable. She currently denies any cough, phlegm production, orthopnea, peripheral swelling, chest pain, or palpitations. An echocardiogram earlier this week showed an ejection fraction of 55-60%. She has chronic back pain and joint pain, also stable. She has occasional headaches, but no significant change in vision and  no recent problems with dizziness.  A detailed review of systems is otherwise stable and noncontributory.   PAST MEDICAL HISTORY: Past Medical History  Diagnosis Date  . Hyperlipidemia   . Hypertension   . Chronic kidney disease   . Osteoporosis   . GERD (gastroesophageal reflux disease)   . IBS (irritable bowel syndrome)   . Arthritis    . Neuromuscular disorder     peripheral neuropathy feet  . History of lower GI bleeding   . Breast cancer 03/15/13    right, 12 o'clock  . Diabetes mellitus without complication     PAST SURGICAL HISTORY: Past Surgical History  Procedure Laterality Date  . Cataract extraction  2009    both  . Skin cancer excision      head, face, hand ..multiple years  . Abdominal hysterectomy  1975    No salpingo-oophorectomy  . Tonsillectomy    . Colonoscopy    . Breast lumpectomy with needle localization and axillary sentinel lymph node bx Right 04/28/2013    Procedure: BREAST LUMPECTOMY WITH NEEDLE LOCALIZATION AND AXILLARY SENTINEL LYMPH NODE BX;  Surgeon: Rolm Bookbinder, MD;  Location: [facility];  Service: General;  Laterality: Right;    FAMILY HISTORY Family History  Problem Relation Age of Onset  . Cancer Maternal Aunt 80    colon cancer  . Cancer Maternal Uncle 65    lung  . Cancer Maternal Grandmother     pancreas   the patient's father died at the age of 23 from a subarachnoid hemorrhage. The patient's mother lived to be and 5.39 years old. She lived independently to be and. The patient had one brother, no sisters. There is a history of colon cancer or in a maternal aunts, diagnosed at age 42, and lung cancer in a maternal uncle diagnosed at age 46. There is no history of breast or ovarian cancer in the family  GYNECOLOGIC HISTORY:  (updated December 2000 410)  [place] can't remember when she had her first period. First live birth came at age 68 and she is [place] 2. She she took hormone replacement, namely estrogen, until 2014 when she was diagnosed with breast cancer.  SOCIAL HISTORY:  (Updated December 2014. 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 Her husband Rebecca Hall is retired, he is largely disabled secondary to back problems following a fall. Daughter Rebecca Hall is a retired Pharmacist, hospital living in [place]. Rebecca Hall's older  son runs a Engineer, civil (consulting) and business in [place] and Rebecca Hall helps out with that. The patient's son Rebecca Hall lives in [place]. He is an Chief Financial Officer. Altogether Dewanna has 4 grandsons, 3 great-grandsons and one great-granddaughter    ADVANCED DIRECTIVES: In place   HEALTH MAINTENANCE:  (Updated January 2015) History  Substance Use Topics  . Smoking status: Never Smoker   . Smokeless tobacco: Never Used  . Alcohol Use: No     Colonoscopy: 2011  PAP: Status post hysterectomy  Bone density:Dec 2014, [facility], osteopenia with T   score of -1.9  Lipid panel: Not on file/Dr. Maxwell Caul   Allergies  Allergen Reactions  . Levaquin [Levofloxacin In D5w] Anaphylaxis and Nausea Only    Shaking real bad on medicaton  . Herceptin [Trastuzumab] Rash    Patient has developed extensive body skin rash following each Herceptin treatment of increased presence following each treatment  that cleared with oral steroids  . Codeine Nausea And Vomiting    Pass out  .  Sulfa Antibiotics Swelling    Current Outpatient Prescriptions  Medication Sig Dispense Refill  . allopurinol (ZYLOPRIM) 100 MG tablet Take 100 mg by mouth daily.     Marland Kitchen allopurinol (ZYLOPRIM) 300 MG tablet Take 300 mg by mouth daily.    Marland Kitchen anastrozole (ARIMIDEX) 1 MG tablet TAKE 1 TABLET (1 MG TOTAL) BY MOUTH DAILY. 90 tablet 3  . aspirin 325 MG tablet Take 325 mg by mouth daily.    Marland Kitchen atorvastatin (LIPITOR) 20 MG tablet Take 20 mg by mouth daily.     . calcium carbonate (OS-CAL) 600 MG TABS tablet Take 600 mg by mouth 2 (two) times daily with a meal.    . diazepam (VALIUM) 5 MG tablet Take 1 tablet (5 mg total) by mouth at bedtime as needed for anxiety. 20 tablet 0  . diphenhydramine-acetaminophen (TYLENOL PM) 25-500 MG TABS Take 1 tablet by mouth at bedtime as needed.    . furosemide (LASIX) 40 MG tablet Take 40 mg by mouth daily.     Marland Kitchen gabapentin (NEURONTIN) 300 MG capsule Take 1 capsule (300 mg total) by mouth at bedtime. 90  capsule 0  . glimepiride (AMARYL) 1 MG tablet Take 1 mg by mouth daily with breakfast.    . lisinopril (PRINIVIL,ZESTRIL) 20 MG tablet Take 20 mg by mouth daily.    Marland Kitchen LORazepam (ATIVAN) 0.5 MG tablet Take 1 tablet (0.5 mg total) by mouth 2 (two) times daily as needed for anxiety. 30 tablet 0  . methylPREDNIsolone (MEDROL DOSPACK) 4 MG tablet follow package directions 21 tablet 0  . minoxidil (ROGAINE) 2 % external solution Apply topically 2 (two) times daily.    . Multiple Vitamin (MULTIVITAMIN WITH MINERALS) TABS tablet Take 1 tablet by mouth daily.    Marland Kitchen omeprazole (PRILOSEC) 20 MG capsule     . potassium chloride SA (K-DUR,KLOR-CON) 20 MEQ tablet Take 1 tablet (20 mEq total) by mouth daily. 90 tablet 12  . predniSONE (DELTASONE) 5 MG tablet     . Vitamin D, Ergocalciferol, (DRISDOL) 50000 UNITS CAPS capsule Take 50,000 Units by mouth.    . Zoledronic Acid (ZOMETA) 4 MG/100ML IVPB Inject 4 mg into the vein.     No current facility-administered medications for this visit.    OBJECTIVE: Older white woman in no acute distress There were no vitals filed for this visit.   There is no weight on file to calculate BMI.    ECOG FS:1 - Symptomatic but completely ambulatory There were no vitals filed for this visit. Physical Exam: HEENT:  Sclerae anicteric.  Oropharynx clear and moist.  NODES:  No cervical or supraclavicular lymphadenopathy palpated.  BREAST EXAM:  Deferred. Axillae are benign bilaterally, no palpable lymphadenopathy. LUNGS:  Clear to auscultation bilaterally.  No wheezes or rhonchi HEART:  Regular rate and rhythm. No murmur  ABDOMEN:  Soft, nontender.  Positive bowel sounds.  SKIN:  There is a maculopapular erythematous rash diffusely on the trunk, more so anteriorly than posteriorly. Is definitely more erythematous and more widespread than when examined earlier this week prior to receiving trastuzumab on 07/12/2013. There no vesicles or pustules noted. NEURO:  Nonfocal. Well  oriented.  Appropriate affect.    LAB RESULTS:   Lab Results  Component Value Date   WBC 4.4 03/16/2014   NEUTROABS 2.1 03/16/2014   HGB 10.9* 03/16/2014   HCT 33.2* 03/16/2014   MCV 97.6 03/16/2014   PLT 136* 03/16/2014      Chemistry      Component  Value Date/Time   NA 136 03/16/2014 1417   K 3.6 03/16/2014 1417   CO2 25 03/16/2014 1417   BUN 24.4 03/16/2014 1417   CREATININE 1.3* 03/16/2014 1417      Component Value Date/Time   CALCIUM 10.7* 03/16/2014 1417   ALKPHOS 84 03/16/2014 1417   AST 19 03/16/2014 1417   ALT 16 03/16/2014 1417   BILITOT 0.53 03/16/2014 1417       STUDIES:  Most recent echocardiogram on 07/14/2013 showed an ejection fraction of 55-60%.  Patient tells me she had a bone density test at [facility] (Dr. Nancee Liter office) in December 2014. This showed osteopenia with a T score of -1.9.  ASSESSMENT: 80 y.o. [place] woman, status post right breast biopsy 03/15/2013 for a clinical T1c N0, stage IA invasive ductal carcinoma, grade 1 or 2, estrogen receptor 100% positive, progesterone receptor 100% positive, with an MIB-1 of 30%, and HER-2 amplified by CISH, with a ratio of 3.51 and an average HER-2 copy number Damita Lack of 6.85  (1) biopsy of 2 suspicious areas in the left breast both showed no evidence of malignancy  (2) right lumpectomy and sentinel lymph node sampling 04/28/2013 showed a pT1b pN0, stage IA invasive ductal carcinoma, grade 2, with negative margins  (3) trastuzumab started 05/31/2013, to be given every 3 weeks for total of 9 doses; being held as of 07/16/2013 after 3 doses because of possible allergic dermatitis associated with the drug.   (4) anastrozole started 05/08/2013  (a) bon scan 06/10/2013 showed osteopenia with a T score of -2.1  (b) zolendronate started 09/13/2013, to be given every 6 months for 2 years   (6)  Will not need radiation therapy.   PLAN: Twanda is doing well from a breast cancer point of view, with no  evidence of disease recurrence now alittle over a year out form her definitive surgery. She is tolerating the anastrozole without significant side effects. The plan is to continue that to a total of 5 years  She is receiving Prolia w/o event--a second dose was given today.  Her rash looks like nummular exczema and she says it improved slightly (at least the itching did) with over the counter steroids. I went ahead and wrote her a scrpit for triamcinolone and discussed the possible toxicities, side effets and complications. She will use it twice daily over affected ares. If she does not see a significant change in a week or so she wil stop. She already has an appoitnment w Dr Delman Cheadle for follow-up in about 2 weeks.  She will see [place] again in April and will receivve her next Prolia in June. She knows to call for any problems that may develop before her next visit here    Chauncey Cruel, MD   06/14/2014 2:47 PM

## 2014-06-15 ENCOUNTER — Telehealth: Payer: Self-pay | Admitting: Oncology

## 2014-06-15 ENCOUNTER — Other Ambulatory Visit: Payer: Self-pay | Admitting: *Deleted

## 2014-06-15 ENCOUNTER — Telehealth: Payer: Self-pay | Admitting: *Deleted

## 2014-06-15 DIAGNOSIS — C50411 Malignant neoplasm of upper-outer quadrant of right female breast: Secondary | ICD-10-CM

## 2014-06-15 MED ORDER — POTASSIUM CHLORIDE CRYS ER 10 MEQ PO TBCR: EXTENDED_RELEASE_TABLET | ORAL | Status: DC

## 2014-06-15 NOTE — Telephone Encounter (Signed)
per pof to sch pt appt-sent MW emailt o sch zometa-gave pt copy of sch

## 2014-06-15 NOTE — Telephone Encounter (Signed)
Per staff message and POF I have scheduled appts. Advised scheduler of appts. JMW  

## 2014-07-05 ENCOUNTER — Other Ambulatory Visit: Payer: Self-pay | Admitting: Oncology

## 2014-07-05 ENCOUNTER — Other Ambulatory Visit: Payer: Self-pay | Admitting: Dermatology

## 2014-07-05 DIAGNOSIS — C50411 Malignant neoplasm of upper-outer quadrant of right female breast: Secondary | ICD-10-CM

## 2014-09-28 ENCOUNTER — Telehealth: Payer: Self-pay | Admitting: Oncology

## 2014-09-28 NOTE — Telephone Encounter (Signed)
pt called to r/s appt..done...pt aware of new d.t °

## 2014-10-03 ENCOUNTER — Other Ambulatory Visit: Payer: Self-pay | Admitting: Oncology

## 2014-10-07 ENCOUNTER — Telehealth: Payer: Self-pay | Admitting: Oncology

## 2014-10-07 NOTE — Telephone Encounter (Signed)
Faxed pt office note to [employer] [phone]

## 2014-10-14 ENCOUNTER — Other Ambulatory Visit: Payer: Self-pay | Admitting: *Deleted

## 2014-10-14 DIAGNOSIS — C50411 Malignant neoplasm of upper-outer quadrant of right female breast: Secondary | ICD-10-CM

## 2014-10-17 ENCOUNTER — Other Ambulatory Visit (HOSPITAL_BASED_OUTPATIENT_CLINIC_OR_DEPARTMENT_OTHER): Payer: PPO

## 2014-10-17 ENCOUNTER — Ambulatory Visit (HOSPITAL_BASED_OUTPATIENT_CLINIC_OR_DEPARTMENT_OTHER): Payer: PPO

## 2014-10-17 ENCOUNTER — Other Ambulatory Visit: Payer: Self-pay | Admitting: Oncology

## 2014-10-17 DIAGNOSIS — Z853 Personal history of malignant neoplasm of breast: Secondary | ICD-10-CM

## 2014-10-17 DIAGNOSIS — M8580 Other specified disorders of bone density and structure, unspecified site: Secondary | ICD-10-CM | POA: Diagnosis not present

## 2014-10-17 DIAGNOSIS — C50411 Malignant neoplasm of upper-outer quadrant of right female breast: Secondary | ICD-10-CM

## 2014-10-17 LAB — CBC WITH DIFFERENTIAL/PLATELET
BASO%: 0.5 % (ref 0.0–2.0)
Basophils Absolute: 0 10*3/uL (ref 0.0–0.1)
EOS%: 2.4 % (ref 0.0–7.0)
Eosinophils Absolute: 0.1 10*3/uL (ref 0.0–0.5)
HCT: 31.7 % — ABNORMAL LOW (ref 34.8–46.6)
HGB: 10.4 g/dL — ABNORMAL LOW (ref 11.6–15.9)
LYMPH%: 34.6 % (ref 14.0–49.7)
MCH: 32.2 pg (ref 25.1–34.0)
MCHC: 32.8 g/dL (ref 31.5–36.0)
MCV: 98.1 fL (ref 79.5–101.0)
MONO#: 0.4 10*3/uL (ref 0.1–0.9)
MONO%: 10.4 % (ref 0.0–14.0)
NEUT#: 2.2 10*3/uL (ref 1.5–6.5)
NEUT%: 52.1 % (ref 38.4–76.8)
Platelets: 131 10*3/uL — ABNORMAL LOW (ref 145–400)
RBC: 3.23 10*6/uL — ABNORMAL LOW (ref 3.70–5.45)
RDW: 14.9 % — ABNORMAL HIGH (ref 11.2–14.5)
WBC: 4.1 10*3/uL (ref 3.9–10.3)
lymph#: 1.4 10*3/uL (ref 0.9–3.3)

## 2014-10-17 LAB — COMPREHENSIVE METABOLIC PANEL (CC13)
ALT: 21 U/L (ref 0–55)
AST: 19 U/L (ref 5–34)
Albumin: 3.7 g/dL (ref 3.5–5.0)
Alkaline Phosphatase: 92 U/L (ref 40–150)
Anion Gap: 13 mEq/L — ABNORMAL HIGH (ref 3–11)
BUN: 15.8 mg/dL (ref 7.0–26.0)
CO2: 25 mEq/L (ref 22–29)
Calcium: 9.8 mg/dL (ref 8.4–10.4)
Chloride: 102 mEq/L (ref 98–109)
Creatinine: 1.4 mg/dL — ABNORMAL HIGH (ref 0.6–1.1)
EGFR: 36 mL/min/{1.73_m2} — ABNORMAL LOW (ref >90)
Glucose: 257 mg/dl — ABNORMAL HIGH (ref 70–140)
Potassium: 3.3 mEq/L — ABNORMAL LOW (ref 3.5–5.1)
Sodium: 141 mEq/L (ref 136–145)
Total Bilirubin: 0.3 mg/dL (ref 0.20–1.20)
Total Protein: 6.6 g/dL (ref 6.4–8.3)

## 2014-10-17 MED ORDER — ZOLEDRONIC ACID 4 MG/100ML IV SOLN
4.0000 mg | Freq: Once | INTRAVENOUS | Status: AC
  Administered 2014-10-17: 4 mg via INTRAVENOUS
  Filled 2014-10-17: qty 100

## 2014-10-17 NOTE — Patient Instructions (Signed)

## 2014-11-30 ENCOUNTER — Encounter: Payer: Self-pay | Admitting: *Deleted

## 2014-11-30 ENCOUNTER — Telehealth: Payer: Self-pay | Admitting: Nurse Practitioner

## 2014-11-30 NOTE — Telephone Encounter (Signed)
Called and spoke with patients husband and he is aware of the June cancelled appointment

## 2014-12-05 ENCOUNTER — Other Ambulatory Visit: Payer: Self-pay

## 2014-12-09 ENCOUNTER — Other Ambulatory Visit: Payer: Medicare Other

## 2014-12-12 ENCOUNTER — Other Ambulatory Visit: Payer: Medicare Other

## 2014-12-12 ENCOUNTER — Ambulatory Visit: Payer: Medicare Other | Admitting: Nurse Practitioner

## 2014-12-13 ENCOUNTER — Other Ambulatory Visit: Payer: Medicare Other

## 2014-12-13 ENCOUNTER — Ambulatory Visit: Payer: Medicare Other | Admitting: Nurse Practitioner

## 2014-12-19 ENCOUNTER — Ambulatory Visit: Payer: Self-pay | Admitting: Nurse Practitioner

## 2014-12-27 ENCOUNTER — Other Ambulatory Visit: Payer: Self-pay | Admitting: Oncology

## 2015-01-19 ENCOUNTER — Encounter (HOSPITAL_COMMUNITY): Payer: Self-pay

## 2015-01-19 ENCOUNTER — Emergency Department (HOSPITAL_COMMUNITY): Payer: PPO

## 2015-01-19 ENCOUNTER — Inpatient Hospital Stay (HOSPITAL_COMMUNITY)
Admission: EM | Admit: 2015-01-19 | Discharge: 2015-01-27 | DRG: 871 | Disposition: A | Discharge status: Home Health | Payer: PPO | Attending: Internal Medicine | Admitting: Internal Medicine

## 2015-01-19 DIAGNOSIS — E669 Obesity, unspecified: Secondary | ICD-10-CM | POA: Diagnosis present

## 2015-01-19 DIAGNOSIS — Z85828 Personal history of other malignant neoplasm of skin: Secondary | ICD-10-CM

## 2015-01-19 DIAGNOSIS — E872 Acidosis, unspecified: Secondary | ICD-10-CM | POA: Diagnosis present

## 2015-01-19 DIAGNOSIS — R571 Hypovolemic shock: Principal | ICD-10-CM | POA: Diagnosis present

## 2015-01-19 DIAGNOSIS — M810 Age-related osteoporosis without current pathological fracture: Secondary | ICD-10-CM | POA: Diagnosis present

## 2015-01-19 DIAGNOSIS — N183 Chronic kidney disease, stage 3 (moderate): Secondary | ICD-10-CM | POA: Diagnosis present

## 2015-01-19 DIAGNOSIS — E785 Hyperlipidemia, unspecified: Secondary | ICD-10-CM | POA: Diagnosis present

## 2015-01-19 DIAGNOSIS — C50911 Malignant neoplasm of unspecified site of right female breast: Secondary | ICD-10-CM | POA: Diagnosis present

## 2015-01-19 DIAGNOSIS — M1990 Unspecified osteoarthritis, unspecified site: Secondary | ICD-10-CM | POA: Diagnosis present

## 2015-01-19 DIAGNOSIS — I255 Ischemic cardiomyopathy: Secondary | ICD-10-CM | POA: Diagnosis present

## 2015-01-19 DIAGNOSIS — N189 Chronic kidney disease, unspecified: Secondary | ICD-10-CM | POA: Diagnosis not present

## 2015-01-19 DIAGNOSIS — N179 Acute kidney failure, unspecified: Secondary | ICD-10-CM | POA: Diagnosis present

## 2015-01-19 DIAGNOSIS — Z800 Family history of malignant neoplasm of digestive organs: Secondary | ICD-10-CM

## 2015-01-19 DIAGNOSIS — I5043 Acute on chronic combined systolic (congestive) and diastolic (congestive) heart failure: Secondary | ICD-10-CM | POA: Diagnosis not present

## 2015-01-19 DIAGNOSIS — J9690 Respiratory failure, unspecified, unspecified whether with hypoxia or hypercapnia: Secondary | ICD-10-CM

## 2015-01-19 DIAGNOSIS — K580 Irritable bowel syndrome with diarrhea: Secondary | ICD-10-CM | POA: Diagnosis present

## 2015-01-19 DIAGNOSIS — I4891 Unspecified atrial fibrillation: Secondary | ICD-10-CM | POA: Diagnosis present

## 2015-01-19 DIAGNOSIS — Z452 Encounter for adjustment and management of vascular access device: Secondary | ICD-10-CM | POA: Diagnosis present

## 2015-01-19 DIAGNOSIS — N951 Menopausal and female climacteric states: Secondary | ICD-10-CM | POA: Diagnosis present

## 2015-01-19 DIAGNOSIS — D61818 Other pancytopenia: Secondary | ICD-10-CM | POA: Diagnosis present

## 2015-01-19 DIAGNOSIS — D696 Thrombocytopenia, unspecified: Secondary | ICD-10-CM | POA: Diagnosis present

## 2015-01-19 DIAGNOSIS — J9601 Acute respiratory failure with hypoxia: Secondary | ICD-10-CM | POA: Diagnosis not present

## 2015-01-19 DIAGNOSIS — R112 Nausea with vomiting, unspecified: Secondary | ICD-10-CM | POA: Diagnosis present

## 2015-01-19 DIAGNOSIS — I5033 Acute on chronic diastolic (congestive) heart failure: Secondary | ICD-10-CM | POA: Diagnosis not present

## 2015-01-19 DIAGNOSIS — E871 Hypo-osmolality and hyponatremia: Secondary | ICD-10-CM | POA: Diagnosis present

## 2015-01-19 DIAGNOSIS — I5021 Acute systolic (congestive) heart failure: Secondary | ICD-10-CM

## 2015-01-19 DIAGNOSIS — E1122 Type 2 diabetes mellitus with diabetic chronic kidney disease: Secondary | ICD-10-CM | POA: Diagnosis present

## 2015-01-19 DIAGNOSIS — I5023 Acute on chronic systolic (congestive) heart failure: Secondary | ICD-10-CM | POA: Diagnosis not present

## 2015-01-19 DIAGNOSIS — T451X5A Adverse effect of antineoplastic and immunosuppressive drugs, initial encounter: Secondary | ICD-10-CM | POA: Diagnosis present

## 2015-01-19 DIAGNOSIS — E875 Hyperkalemia: Secondary | ICD-10-CM | POA: Diagnosis present

## 2015-01-19 DIAGNOSIS — I129 Hypertensive chronic kidney disease with stage 1 through stage 4 chronic kidney disease, or unspecified chronic kidney disease: Secondary | ICD-10-CM | POA: Diagnosis present

## 2015-01-19 DIAGNOSIS — E1129 Type 2 diabetes mellitus with other diabetic kidney complication: Secondary | ICD-10-CM

## 2015-01-19 DIAGNOSIS — M545 Low back pain: Secondary | ICD-10-CM | POA: Diagnosis present

## 2015-01-19 DIAGNOSIS — Z6830 Body mass index (BMI) 30.0-30.9, adult: Secondary | ICD-10-CM | POA: Diagnosis not present

## 2015-01-19 DIAGNOSIS — R197 Diarrhea, unspecified: Secondary | ICD-10-CM | POA: Diagnosis present

## 2015-01-19 DIAGNOSIS — I959 Hypotension, unspecified: Secondary | ICD-10-CM | POA: Diagnosis present

## 2015-01-19 DIAGNOSIS — J189 Pneumonia, unspecified organism: Secondary | ICD-10-CM

## 2015-01-19 DIAGNOSIS — Z170 Estrogen receptor positive status [ER+]: Secondary | ICD-10-CM | POA: Diagnosis not present

## 2015-01-19 DIAGNOSIS — E1142 Type 2 diabetes mellitus with diabetic polyneuropathy: Secondary | ICD-10-CM | POA: Diagnosis present

## 2015-01-19 DIAGNOSIS — Z7983 Long term (current) use of bisphosphonates: Secondary | ICD-10-CM | POA: Diagnosis not present

## 2015-01-19 DIAGNOSIS — I2510 Atherosclerotic heart disease of native coronary artery without angina pectoris: Secondary | ICD-10-CM | POA: Diagnosis not present

## 2015-01-19 DIAGNOSIS — N170 Acute kidney failure with tubular necrosis: Secondary | ICD-10-CM | POA: Diagnosis not present

## 2015-01-19 DIAGNOSIS — R5381 Other malaise: Secondary | ICD-10-CM | POA: Diagnosis present

## 2015-01-19 DIAGNOSIS — Z79811 Long term (current) use of aromatase inhibitors: Secondary | ICD-10-CM | POA: Diagnosis not present

## 2015-01-19 DIAGNOSIS — K219 Gastro-esophageal reflux disease without esophagitis: Secondary | ICD-10-CM | POA: Diagnosis present

## 2015-01-19 DIAGNOSIS — Z7982 Long term (current) use of aspirin: Secondary | ICD-10-CM

## 2015-01-19 DIAGNOSIS — R0683 Snoring: Secondary | ICD-10-CM | POA: Diagnosis not present

## 2015-01-19 DIAGNOSIS — R232 Flushing: Secondary | ICD-10-CM | POA: Diagnosis present

## 2015-01-19 DIAGNOSIS — R0600 Dyspnea, unspecified: Secondary | ICD-10-CM

## 2015-01-19 DIAGNOSIS — R579 Shock, unspecified: Secondary | ICD-10-CM | POA: Insufficient documentation

## 2015-01-19 LAB — COMPREHENSIVE METABOLIC PANEL
ALT: 21 U/L (ref 14–54)
AST: 23 U/L (ref 15–41)
Albumin: 3.3 g/dL — ABNORMAL LOW (ref 3.5–5.0)
Alkaline Phosphatase: 53 U/L (ref 38–126)
Anion gap: 12 (ref 5–15)
BUN: 99 mg/dL — ABNORMAL HIGH (ref 6–20)
CO2: 12 mmol/L — ABNORMAL LOW (ref 22–32)
Calcium: 8.1 mg/dL — ABNORMAL LOW (ref 8.9–10.3)
Chloride: 102 mmol/L (ref 101–111)
Creatinine, Ser: 4.78 mg/dL — ABNORMAL HIGH (ref 0.44–1.00)
GFR calc Af Amer: 9 mL/min — ABNORMAL LOW (ref >60)
GFR calc non Af Amer: 8 mL/min — ABNORMAL LOW (ref >60)
Glucose, Bld: 119 mg/dL — ABNORMAL HIGH (ref 65–99)
Potassium: 5.6 mmol/L — ABNORMAL HIGH (ref 3.5–5.1)
Sodium: 126 mmol/L — ABNORMAL LOW (ref 135–145)
Total Bilirubin: 0.3 mg/dL (ref 0.3–1.2)
Total Protein: 5.8 g/dL — ABNORMAL LOW (ref 6.5–8.1)

## 2015-01-19 LAB — PROCALCITONIN: Procalcitonin: <0.1 ng/mL

## 2015-01-19 LAB — CBC WITH DIFFERENTIAL/PLATELET
Basophils Absolute: 0 10*3/uL (ref 0.0–0.1)
Basophils Relative: 0 % (ref 0–1)
Eosinophils Absolute: 0 10*3/uL (ref 0.0–0.7)
Eosinophils Relative: 0 % (ref 0–5)
HCT: 27.6 % — ABNORMAL LOW (ref 36.0–46.0)
Hemoglobin: 9.3 g/dL — ABNORMAL LOW (ref 12.0–15.0)
Lymphocytes Relative: 12 % (ref 12–46)
Lymphs Abs: 0.7 10*3/uL (ref 0.7–4.0)
MCH: 31.6 pg (ref 26.0–34.0)
MCHC: 33.7 g/dL (ref 30.0–36.0)
MCV: 93.9 fL (ref 78.0–100.0)
Monocytes Absolute: 0.3 10*3/uL (ref 0.1–1.0)
Monocytes Relative: 4 % (ref 3–12)
Neutro Abs: 5.1 10*3/uL (ref 1.7–7.7)
Neutrophils Relative %: 84 % — ABNORMAL HIGH (ref 43–77)
Platelets: 94 10*3/uL — ABNORMAL LOW (ref 150–400)
RBC: 2.94 MIL/uL — ABNORMAL LOW (ref 3.87–5.11)
RDW: 14.6 % (ref 11.5–15.5)
WBC: 6.1 10*3/uL (ref 4.0–10.5)

## 2015-01-19 LAB — GLUCOSE, CAPILLARY: Glucose-Capillary: 208 mg/dL — ABNORMAL HIGH (ref 65–99)

## 2015-01-19 LAB — ABO/RH: ABO/RH(D): O NEG

## 2015-01-19 LAB — TYPE AND SCREEN
ABO/RH(D): O NEG
Antibody Screen: NEGATIVE

## 2015-01-19 LAB — I-STAT CG4 LACTIC ACID, ED
Lactic Acid, Venous: 1.02 mmol/L (ref 0.5–2.0)
Lactic Acid, Venous: 0.56 mmol/L (ref 0.5–2.0)

## 2015-01-19 LAB — PROTIME-INR
INR: 1.11 (ref 0.00–1.49)
Prothrombin Time: 14.5 seconds (ref 11.6–15.2)

## 2015-01-19 LAB — I-STAT TROPONIN, ED: Troponin i, poc: 0 ng/mL (ref 0.00–0.08)

## 2015-01-19 MED ORDER — VANCOMYCIN HCL 10 G IV SOLR
1250.0000 mg | Freq: Once | INTRAVENOUS | Status: AC
  Administered 2015-01-19: 1250 mg via INTRAVENOUS
  Filled 2015-01-19: qty 1250

## 2015-01-19 MED ORDER — SODIUM CHLORIDE 0.9 % IV SOLN
1.0000 g | Freq: Once | INTRAVENOUS | Status: AC
  Administered 2015-01-19: 1 g via INTRAVENOUS
  Filled 2015-01-19: qty 10

## 2015-01-19 MED ORDER — SODIUM POLYSTYRENE SULFONATE 15 GM/60ML PO SUSP: 30.0000 g | Freq: Once | ORAL | Status: DC

## 2015-01-19 MED ORDER — PIPERACILLIN-TAZOBACTAM IN DEX 2-0.25 GM/50ML IV SOLN
2.2500 g | Freq: Three times a day (TID) | INTRAVENOUS | Status: DC
  Administered 2015-01-20 – 2015-01-23 (×11): 2.25 g via INTRAVENOUS
  Filled 2015-01-19 (×13): qty 50

## 2015-01-19 MED ORDER — ANASTROZOLE 1 MG PO TABS
1.0000 mg | ORAL_TABLET | Freq: Every day | ORAL | Status: DC
  Administered 2015-01-20 – 2015-01-27 (×8): 1 mg via ORAL
  Filled 2015-01-19 (×12): qty 1

## 2015-01-19 MED ORDER — PANTOPRAZOLE SODIUM 40 MG IV SOLR
40.0000 mg | INTRAVENOUS | Status: DC
  Administered 2015-01-19 – 2015-01-21 (×3): 40 mg via INTRAVENOUS
  Filled 2015-01-19 (×4): qty 40

## 2015-01-19 MED ORDER — SODIUM CHLORIDE 0.9 % IV BOLUS (SEPSIS)
1000.0000 mL | Freq: Once | INTRAVENOUS | Status: AC
  Administered 2015-01-19: 1000 mL via INTRAVENOUS

## 2015-01-19 MED ORDER — INSULIN ASPART 100 UNIT/ML ~~LOC~~ SOLN
0.0000 [IU] | SUBCUTANEOUS | Status: DC
  Administered 2015-01-20: 8 [IU] via SUBCUTANEOUS
  Administered 2015-01-20: 2 [IU] via SUBCUTANEOUS
  Administered 2015-01-20: 8 [IU] via SUBCUTANEOUS
  Administered 2015-01-21 – 2015-01-22 (×4): 2 [IU] via SUBCUTANEOUS
  Administered 2015-01-22: 3 [IU] via SUBCUTANEOUS
  Administered 2015-01-22: 2 [IU] via SUBCUTANEOUS

## 2015-01-19 MED ORDER — HEPARIN SODIUM (PORCINE) 5000 UNIT/ML IJ SOLN
5000.0000 [IU] | Freq: Three times a day (TID) | INTRAMUSCULAR | Status: DC
  Administered 2015-01-19 – 2015-01-22 (×8): 5000 [IU] via SUBCUTANEOUS
  Filled 2015-01-19 (×10): qty 1

## 2015-01-19 MED ORDER — SODIUM CHLORIDE 0.9 % IV SOLN
INTRAVENOUS | Status: DC
  Administered 2015-01-19: 22:00:00 via INTRAVENOUS

## 2015-01-19 MED ORDER — PIPERACILLIN-TAZOBACTAM 3.375 G IVPB 30 MIN
3.3750 g | Freq: Once | INTRAVENOUS | Status: AC
  Administered 2015-01-19: 3.375 g via INTRAVENOUS
  Filled 2015-01-19: qty 50

## 2015-01-19 MED ORDER — SODIUM BICARBONATE 8.4 % IV SOLN
INTRAVENOUS | Status: DC
  Administered 2015-01-19 – 2015-01-21 (×4): via INTRAVENOUS
  Filled 2015-01-19 (×8): qty 150

## 2015-01-19 MED ORDER — SODIUM CHLORIDE 0.9 % IV BOLUS (SEPSIS)
500.0000 mL | Freq: Once | INTRAVENOUS | Status: AC
  Administered 2015-01-19: 500 mL via INTRAVENOUS

## 2015-01-19 MED ORDER — DEXTROSE 5 % IV SOLN
30.0000 ug/min | INTRAVENOUS | Status: DC
  Administered 2015-01-19: 120 ug/min via INTRAVENOUS
  Administered 2015-01-19: 100 ug/min via INTRAVENOUS
  Administered 2015-01-20: 170 ug/min via INTRAVENOUS
  Filled 2015-01-19 (×2): qty 1

## 2015-01-19 MED ORDER — IOHEXOL 300 MG/ML  SOLN
25.0000 mL | INTRAMUSCULAR | Status: AC
  Administered 2015-01-19: 25 mL via ORAL

## 2015-01-19 MED ORDER — HYDROCORTISONE NA SUCCINATE PF 100 MG IJ SOLR
100.0000 mg | Freq: Once | INTRAMUSCULAR | Status: AC
  Administered 2015-01-19: 100 mg via INTRAVENOUS
  Filled 2015-01-19: qty 2

## 2015-01-19 MED ORDER — PHENYLEPHRINE HCL 10 MG/ML IJ SOLN
0.0000 ug/min | INTRAVENOUS | Status: DC
  Administered 2015-01-19: 100 ug/min via INTRAVENOUS
  Filled 2015-01-19: qty 1

## 2015-01-19 MED ORDER — PHENYLEPHRINE HCL 10 MG/ML IJ SOLN
0.0000 ug/min | Freq: Once | INTRAVENOUS | Status: AC
  Administered 2015-01-19: 50 ug/min via INTRAVENOUS
  Filled 2015-01-19: qty 1

## 2015-01-19 MED ORDER — ONDANSETRON HCL 4 MG/2ML IJ SOLN
4.0000 mg | Freq: Once | INTRAMUSCULAR | Status: AC
  Administered 2015-01-19: 4 mg via INTRAVENOUS
  Filled 2015-01-19: qty 2

## 2015-01-19 MED ORDER — SODIUM CHLORIDE 0.9 % IV SOLN
250.0000 mL | INTRAVENOUS | Status: DC | PRN
  Administered 2015-01-19: 250 mL via INTRAVENOUS

## 2015-01-19 NOTE — ED Notes (Signed)
Pt reports n/v/d and hypotension/dizziness that began on Sunday.  Pt was seen at [facility] this afternoon and was found to have a sitting BP of 80/40.  EMS reported same manual BP.  Pt becomes very dizzy from lying position to sitting.  Pt best tolerates trendelenburg.

## 2015-01-19 NOTE — ED Notes (Signed)
Pharmacy called x 2 about neo-synephrine, they stated it's on the way

## 2015-01-19 NOTE — H&P (Signed)
PULMONARY / CRITICAL CARE MEDICINE   Name: Rebecca Hall MRN: [id number] DOB: September 23, 1931    ADMISSION DATE:  01/19/2015 CONSULTATION DATE:  01/19/2015  REFERRING MD :  EDP  CHIEF COMPLAINT:  Hypotension  INITIAL PRESENTATION:  81 y.o. F brought to [facility] ED with hypotension, N/V/D.  In ED, remained hypotensive despite 4L IVF; therefore, started on neosynephrine peripherally.  PCCM called for admission.    STUDIES:  CXR 7/28 >>> CM with interstitial edema. CT A/P 7/28 >>> no acute process.  SIGNIFICANT EVENTS: 7/28 - admit with shock, favor hypovolemic.   HISTORY OF PRESENT ILLNESS:  Rebecca Hall is an 81 y.o. F with PMH as outlined below.  She was brought to [facility] ED 01/19/15 for N/V/D, hypotension, and dizziness that began roughly 3 - 4 days ago.  Initially she didn't think much of her symptoms as they somewhat resolved; however, one day ago, symptoms worsened again.  On day of presentation, she went to PCP and was found to have BP of 80/40 with dizziness from lying to sitting position; therefore, she was sent to ED for further evaluation.  In ED, she remained hypotensive despite 4L IVF.  She was also hypothermic to 70F; therefore, was placed on a bear hugger.  Labs significant for Na 126, K 5.6, SCr 4.78 (baseline ~ 1.3).  WBC normal with 84% neutrophils.  Lactate negative x 2.  SpO2 on room air ~ 90% therefore placed on 2L O2 via [place].  She denies recent fevers/chills/sweats, chest pain, SOB, cough, myalgias.  She states N/V has worsened over the past day and she has had mild diarrhea also.  No recent abx.  No recent travel or exposure to known sick contacts.  She had dark stool one day prior to ED presentation; however, this has since resolved.  FOBT in ED was negative.  She has had slightly decreased PO intake over the past few days due to symptoms mentioned above.   PAST MEDICAL HISTORY :   has a past medical history of Hyperlipidemia; Hypertension; Chronic kidney disease; Osteoporosis;  GERD (gastroesophageal reflux disease); IBS (irritable bowel syndrome); Arthritis; Neuromuscular disorder; History of lower GI bleeding; Breast cancer (03/15/13); and Diabetes mellitus without complication.  has past surgical history that includes Cataract extraction (2009); Skin cancer excision; Abdominal hysterectomy (1975); Tonsillectomy; Colonoscopy; and Breast lumpectomy with needle localization and axillary sentinel lymph node bx (Right, 04/28/2013). Prior to Admission medications   Medication Sig Start Date End Date Taking? Authorizing Provider  allopurinol (ZYLOPRIM) 100 MG tablet Take 100 mg by mouth daily.  02/05/13   Historical Provider, MD  allopurinol (ZYLOPRIM) 300 MG tablet Take 300 mg by mouth daily.    Historical Provider, MD  anastrozole (ARIMIDEX) 1 MG tablet TAKE 1 TABLET (1 MG TOTAL) BY MOUTH DAILY. 06/14/14   Chauncey Cruel, MD  aspirin 325 MG tablet Take 325 mg by mouth daily.    Historical Provider, MD  atorvastatin (LIPITOR) 20 MG tablet Take 20 mg by mouth daily.  02/23/13   Historical Provider, MD  calcium carbonate (OS-CAL) 600 MG TABS tablet Take 600 mg by mouth 2 (two) times daily with a meal.    Historical Provider, MD  diazepam (VALIUM) 5 MG tablet Take 1 tablet (5 mg total) by mouth at bedtime as needed for anxiety. 01/07/14   Chauncey Cruel, MD  diphenhydramine-acetaminophen (TYLENOL PM) 25-500 MG TABS Take 1 tablet by mouth at bedtime as needed.    Historical Provider, MD  furosemide (LASIX) 40  MG tablet Take 40 mg by mouth daily.  03/08/13   Historical Provider, MD  gabapentin (NEURONTIN) 300 MG capsule TAKE 1 CAPSULE BY MOUTH DAILY AT BEDTIME 12/27/14   Chauncey Cruel, MD  glimepiride (AMARYL) 1 MG tablet Take 1 mg by mouth daily with breakfast.    Historical Provider, MD  lisinopril (PRINIVIL,ZESTRIL) 20 MG tablet Take 20 mg by mouth daily.    Historical Provider, MD  LORazepam (ATIVAN) 0.5 MG tablet Take 1 tablet (0.5 mg total) by mouth 2 (two) times daily as  needed for anxiety. 07/12/13   Amy Milda Smart, PA-C  methylPREDNIsolone (MEDROL DOSPACK) 4 MG tablet follow package directions 07/12/13   Amy Milda Smart, PA-C  minoxidil (ROGAINE) 2 % external solution Apply topically 2 (two) times daily.    Historical Provider, MD  Multiple Vitamin (MULTIVITAMIN WITH MINERALS) TABS tablet Take 1 tablet by mouth daily.    Historical Provider, MD  omeprazole (PRILOSEC) 20 MG capsule  03/08/13   Historical Provider, MD  potassium chloride SA (K-DUR,KLOR-CON) 10 MEQ tablet TAKE TWO TABS TWICE A DAY 06/15/14   Chauncey Cruel, MD  predniSONE (DELTASONE) 5 MG tablet  10/05/13   Historical Provider, MD  triamcinolone ointment (KENALOG) 0.5 % Apply 1 application topically 2 (two) times daily. 06/14/14   Chauncey Cruel, MD  Vitamin D, Ergocalciferol, (DRISDOL) 50000 UNITS CAPS capsule Take 50,000 Units by mouth.    Historical Provider, MD  Zoledronic Acid (ZOMETA) 4 MG/100ML IVPB Inject 4 mg into the vein.    Historical Provider, MD   Allergies  Allergen Reactions  . Levaquin [Levofloxacin In D5w] Anaphylaxis and Nausea Only    Shaking real bad on medicaton  . Herceptin [Trastuzumab] Rash    Patient has developed extensive body skin rash following each Herceptin treatment of increased presence following each treatment  that cleared with oral steroids  . Codeine Nausea And Vomiting    Pass out  . Sulfa Antibiotics Swelling    FAMILY HISTORY:  Family History  Problem Relation Age of Onset  . Cancer Maternal Aunt 80    colon cancer  . Cancer Maternal Uncle 65    lung  . Cancer Maternal Grandmother     pancreas    SOCIAL HISTORY:  reports that she has never smoked. She has never used smokeless tobacco. She reports that she does not drink alcohol or use illicit drugs.  REVIEW OF SYSTEMS:   All negative; except for those that are bolded, which indicate positives.  Constitutional: weight loss, weight gain, night sweats, fevers, chills, fatigue, weakness.  HEENT:  headaches, sore throat, sneezing, nasal congestion, post nasal drip, difficulty swallowing, tooth/dental problems, visual complaints, visual changes, ear aches. Neuro: difficulty with speech, weakness, numbness, ataxia. CV:  chest pain, orthopnea, PND, swelling in lower extremities, dizziness, palpitations, syncope.  Resp: cough, hemoptysis, dyspnea, wheezing. GI  heartburn, indigestion, abdominal pain, nausea, vomiting, diarrhea, constipation, change in bowel habits, loss of appetite, hematemesis, melena, hematochezia.  GU: dysuria, change in color of urine, urgency or frequency, flank pain, hematuria. MSK: joint pain or swelling, decreased range of motion. Psych: change in mood or affect, depression, anxiety, suicidal ideations, homicidal ideations. Skin: rash, itching, bruising.   SUBJECTIVE:   VITAL SIGNS: Temp:  [92.9 F (33.8 C)-97.2 F (36.2 C)] 97.2 F (36.2 C) (07/28 [id number]) Pulse Rate:  [73-105] 82 (07/28 2135) Resp:  [13-22] 17 (07/28 2135) BP: (65-140)/(27-81) 94/34 mmHg (07/28 2135) SpO2:  [90 %-99 %] 95 % (07/28 2135) Weight:  [  72.576 kg (160 lb)] 72.576 kg (160 lb) (07/28 1641) HEMODYNAMICS:   VENTILATOR SETTINGS:   INTAKE / OUTPUT: Intake/Output    None     PHYSICAL EXAMINATION: General: WDWN female, in NAD. Neuro: A&O x 3, non-focal.  HEENT: [place]/AT. PERRL, sclerae anicteric. MM dry. Cardiovascular: RRR, no M/R/G.  Lungs: Respirations even and unlabored.  CTA bilaterally, No W/R/R. Abdomen: BS x 4, soft, NT/ND.  Musculoskeletal: No gross deformities, no edema.  Skin: Dry, warm, no rashes.  LABS:  CBC  Recent Labs Lab 01/19/15 1700  WBC 6.1  HGB 9.3*  HCT 27.6*  PLT 94*   Coag's  Recent Labs Lab 01/19/15 1700  INR 1.11   BMET  Recent Labs Lab 01/19/15 1700  NA 126*  K 5.6*  CL 102  CO2 12*  BUN 99*  CREATININE 4.78*  GLUCOSE 119*   Electrolytes  Recent Labs Lab 01/19/15 1700  CALCIUM 8.1*   Sepsis Markers  Recent Labs Lab  01/19/15 1710 01/19/15 2011  LATICACIDVEN 1.02 0.56   ABG No results for input(s): PHART, PCO2ART, PO2ART in the last 168 hours. Liver Enzymes  Recent Labs Lab 01/19/15 1700  AST 23  ALT 21  ALKPHOS 53  BILITOT 0.3  ALBUMIN 3.3*   Cardiac Enzymes No results for input(s): TROPONINI, PROBNP in the last 168 hours. Glucose No results for input(s): GLUCAP in the last 168 hours.  Imaging Ct Abdomen Pelvis Wo Contrast  01/19/2015   CLINICAL DATA:  Nausea, vomiting, diarrhea starting Sunday.  EXAM: CT ABDOMEN AND PELVIS WITHOUT CONTRAST  TECHNIQUE: Multidetector CT imaging of the abdomen and pelvis was performed following the standard protocol without IV contrast.  COMPARISON:  None.  FINDINGS: There is no nephrolithiasis or hydroureteronephrosis bilaterally. The kidneys are normal. The liver, spleen, pancreas are normal. The adrenal glands are normal. There is a gallstone in the gallbladder without perigallbladder inflammatory change. There is atherosclerosis of the abdominal aorta without aneurysmal dilatation. There is no abdominal lymphadenopathy.  There is a hiatal hernia. There is no small bowel obstruction or diverticulitis. The appendix is not seen but no inflammation is noted around the cecum to suggest appendicitis.  Partial fluid-filled bladder is normal. Patient is status post prior hysterectomy. Minimal free fluid is identified in the pelvis.  There are small bilateral pleural effusions with atelectasis of the posterior lungs. Degenerative joint changes of the spine are noted.  IMPRESSION: No acute abnormality identified in the abdomen and pelvis. There is no bowel obstruction or diverticulitis.   Electronically Signed   By: Abelardo Diesel M.D.   On: 01/19/2015 19:07   Dg Chest Portable 1 View  01/19/2015   CLINICAL DATA:  Shortness of breath and emesis  EXAM: PORTABLE CHEST - 1 VIEW  COMPARISON:  Oct 31, 2009  FINDINGS: The heart size and mediastinal contours are stable. The heart  size is enlarged. The aorta is tortuous. There is diffuse bilateral increased pulmonary interstitium. There is no focal pneumonia or pleural effusion. The visualized skeletal structures are stable.  IMPRESSION: Cardiomegaly.  Interstitial edema.   Electronically Signed   By: Abelardo Diesel M.D.   On: 01/19/2015 20:34     ASSESSMENT / PLAN:  CARDIOVASCULAR CVL pending 7/28 >>> A:  Shock - favor hypovolemic due to decreased PO intake over past few days combined with N/V/D.  Lactate reassuring. Hx HTN, HLD. P:  Additional 500cc IVF bolus. Place CVL. Neosynephrine to maintain goal MAP > 65. Monitor CVP's - goal 8 - 12. Check  cortisol (has hx of being on prednisone for gout flares). Consider echo if no improvement. Hold outpatient aspirin, atorvastatin, furosemide, lisinopril.  PULMONARY A: Acute hypoxic respiratory failure. Mild pulmonary edema R > L. P:   Continue supplemental O2 as needed to maintain SpO2 > 92%. Caution with aggressive IVF resuscitation. Pulmonary hygiene. CXR in AM.  RENAL A:   Hyponatremia - likely from decreased PO + AoCKD. Hyperkalemia. NAG acidosis - due to N/V/D. AoCKD - stage II. Hypocalcemia. P:   HCO3 @ 100. 30g kayexalate. 1g Ca gluconate. BMP in AM.  GASTROINTESTINAL A:   GERD. IBS. Hx LGIB. Nutrition. P:   Continue PPI. NPO.  HEMATOLOGIC / ONCOLOGIC A:   Hx breast CA s/p right lumpectomy - followed by Dr. Jana Hakim. VTE Prophylaxis. P:  Continue anastrozole. SCD's / Heparin. CBC in AM.  INFECTIOUS A:   Shock - favor hypovolemic but can not rule out septic at this point. P:   BCx2 7/28 > UCx 7/28 > Abx: Vanc, start date 7/28, day 1/x. Abx: Zosyn, start date 7/28, day 1/x. PCT algorithm to limit abx exposure.  ENDOCRINE A:   DM.   P:   SSI.  NEUROLOGIC A:   Peripheral neuropathy. P:   Hold outpatient diazepam, gabapentin, lorazepam.   Family updated: Husband at bedside.  Interdisciplinary Family Meeting v  Palliative Care Meeting:  Due by: 01/25/15.  CC time:  35 minutes.   [name], [facility] Pager: [phone]  or [phone] 01/19/2015, 9:49 PM

## 2015-01-19 NOTE — ED Notes (Signed)
Reister MD and Rancour MD made aware of pt blood pressure.

## 2015-01-19 NOTE — ED Notes (Signed)
Pt still unable to give urine specimen.

## 2015-01-19 NOTE — ED Notes (Signed)
CT called and made aware that Rancour MD is fine with CT w/o and wants pt scanned STAT.

## 2015-01-19 NOTE — ED Provider Notes (Signed)
CSN: [id number]     Arrival date & time 01/19/15  1636 History   First MD Initiated Contact with Patient 01/19/15 1640     Chief Complaint  Patient presents with  . Hypotension   (Consider location/radiation/quality/duration/timing/severity/associated sxs/prior Treatment) HPI  Pt is an 81 yo female with a hx of CKD, GI bleed, HLD, and HTN presenting for hypotension form her PCP today.  Pt reports one week of generalized fatigue with dizziness.  Over the last three days has had increasing nausea, vomiting, and diarrhea.  Reports multiple episodes where dizzy while standing but no syncopal episodes.  Vomit NBNB.  Admits to one episode of melena yesterday but has not continued.  Denies any fevers, chills, cough, dysuria, flank pain, CP, headache or SOB.  Admits to only mild aching,  RUQ abdominal pain, non-radiating with no alleviating symptoms. Able to tolerate some PO over last three days.  Went to PCP today where found to be profoundly hypotensive and sent to the ED.   Past Medical History  Diagnosis Date  . Hyperlipidemia   . Hypertension   . Chronic kidney disease   . Osteoporosis   . GERD (gastroesophageal reflux disease)   . IBS (irritable bowel syndrome)   . Arthritis   . Neuromuscular disorder     peripheral neuropathy feet  . History of lower GI bleeding   . Breast cancer 03/15/13    right, 12 o'clock  . Diabetes mellitus without complication    Past Surgical History  Procedure Laterality Date  . Cataract extraction  2009    both  . Skin cancer excision      head, face, hand ..multiple years  . Abdominal hysterectomy  1975    No salpingo-oophorectomy  . Tonsillectomy    . Colonoscopy    . Breast lumpectomy with needle localization and axillary sentinel lymph node bx Right 04/28/2013    Procedure: BREAST LUMPECTOMY WITH NEEDLE LOCALIZATION AND AXILLARY SENTINEL LYMPH NODE BX;  Surgeon: Rolm Bookbinder, MD;  Location: [facility];  Service: General;   Laterality: Right;   Family History  Problem Relation Age of Onset  . Cancer Maternal Aunt 80    colon cancer  . Cancer Maternal Uncle 65    lung  . Cancer Maternal Grandmother     pancreas   History  Substance Use Topics  . Smoking status: Never Smoker   . Smokeless tobacco: Never Used  . Alcohol Use: No   OB History    No data available     Review of Systems  Constitutional: Negative for fever and chills.  HENT: Negative for congestion and sore throat.   Eyes: Negative for pain.  Respiratory: Negative for cough and shortness of breath.   Cardiovascular: Negative for chest pain and palpitations.  Gastrointestinal: Positive for nausea, vomiting, abdominal pain and diarrhea. Negative for constipation, anal bleeding and rectal pain.  Genitourinary: Negative for dysuria and flank pain.  Musculoskeletal: Negative for back pain and neck pain.  Skin: Negative for rash.  Allergic/Immunologic: Negative.   Neurological: Negative for dizziness and light-headedness.  Psychiatric/Behavioral: Negative for confusion.    Allergies  Levaquin; Herceptin; Codeine; and Sulfa antibiotics  Home Medications   Prior to Admission medications   Medication Sig Start Date End Date Taking? Authorizing Provider  Zoledronic Acid (ZOMETA) 4 MG/100ML IVPB Inject 4 mg into the vein every 6 (six) months.    Yes Historical Provider, MD  allopurinol (ZYLOPRIM) 100 MG tablet Take 100 mg by mouth daily.  02/05/13   Historical Provider, MD  allopurinol (ZYLOPRIM) 300 MG tablet Take 300 mg by mouth daily.    Historical Provider, MD  anastrozole (ARIMIDEX) 1 MG tablet TAKE 1 TABLET (1 MG TOTAL) BY MOUTH DAILY. 06/14/14   Chauncey Cruel, MD  aspirin 325 MG tablet Take 325 mg by mouth daily.    Historical Provider, MD  atorvastatin (LIPITOR) 20 MG tablet Take 20 mg by mouth daily.  02/23/13   Historical Provider, MD  calcium carbonate (OS-CAL) 600 MG TABS tablet Take 600 mg by mouth 2 (two) times daily with a  meal.    Historical Provider, MD  diazepam (VALIUM) 5 MG tablet Take 1 tablet (5 mg total) by mouth at bedtime as needed for anxiety. 01/07/14   Chauncey Cruel, MD  diphenhydramine-acetaminophen (TYLENOL PM) 25-500 MG TABS Take 1 tablet by mouth at bedtime as needed.    Historical Provider, MD  furosemide (LASIX) 40 MG tablet Take 40 mg by mouth daily.  03/08/13   Historical Provider, MD  gabapentin (NEURONTIN) 300 MG capsule TAKE 1 CAPSULE BY MOUTH DAILY AT BEDTIME 12/27/14   Chauncey Cruel, MD  glimepiride (AMARYL) 1 MG tablet Take 1 mg by mouth daily with breakfast.    Historical Provider, MD  lisinopril (PRINIVIL,ZESTRIL) 20 MG tablet Take 20 mg by mouth daily.    Historical Provider, MD  LORazepam (ATIVAN) 0.5 MG tablet Take 1 tablet (0.5 mg total) by mouth 2 (two) times daily as needed for anxiety. 07/12/13   Amy Milda Smart, PA-C  methylPREDNIsolone (MEDROL DOSPACK) 4 MG tablet follow package directions 07/12/13   Amy Milda Smart, PA-C  minoxidil (ROGAINE) 2 % external solution Apply topically 2 (two) times daily.    Historical Provider, MD  Multiple Vitamin (MULTIVITAMIN WITH MINERALS) TABS tablet Take 1 tablet by mouth daily.    Historical Provider, MD  omeprazole (PRILOSEC) 20 MG capsule  03/08/13   Historical Provider, MD  potassium chloride SA (K-DUR,KLOR-CON) 10 MEQ tablet TAKE TWO TABS TWICE A DAY 06/15/14   Chauncey Cruel, MD  predniSONE (DELTASONE) 5 MG tablet  10/05/13   Historical Provider, MD  triamcinolone ointment (KENALOG) 0.5 % Apply 1 application topically 2 (two) times daily. 06/14/14   Chauncey Cruel, MD  Vitamin D, Ergocalciferol, (DRISDOL) 50000 UNITS CAPS capsule Take 50,000 Units by mouth.    Historical Provider, MD   BP 104/73 mmHg  Pulse 70  Temp(Src) 97.5 F (36.4 C) (Oral)  Resp 13  Ht 5\' 2"  (1.575 m)  Wt 187 lb 2.7 oz (84.9 kg)  BMI 34.23 kg/m2  SpO2 100% Physical Exam  Constitutional: She is oriented to person, place, and time. She appears well-developed and  well-nourished. No distress.  HENT:  Head: Normocephalic and atraumatic.  Eyes: Conjunctivae and EOM are normal. Pupils are equal, round, and reactive to light.  Neck: Normal range of motion. Neck supple.  Cardiovascular: Normal rate, regular rhythm and normal heart sounds.   Pulses:      Radial pulses are 2+ on the right side, and 2+ on the left side.       Femoral pulses are 2+ on the right side, and 2+ on the left side.      Dorsalis pedis pulses are 2+ on the right side, and 2+ on the left side.  Pulmonary/Chest: Effort normal and breath sounds normal. No accessory muscle usage. No respiratory distress. She has no decreased breath sounds. She has no wheezes. She has no rales.  Abdominal: Soft. Bowel sounds are normal. She exhibits no pulsatile midline mass. There is tenderness in the right upper quadrant. There is no rigidity, no guarding, no CVA tenderness, no tenderness at McBurney's point and negative Murphy's sign.  Genitourinary: Rectal exam shows no external hemorrhoid, no fissure and anal tone normal. Guaiac negative stool.  Musculoskeletal: Normal range of motion.  Neurological: She is alert and oriented to person, place, and time. She has normal reflexes. No cranial nerve deficit.  Skin: Skin is warm and dry. She is not diaphoretic. There is pallor.  Psychiatric: She has a normal mood and affect.   ED Course  Procedures (including critical care time) Labs Review Labs Reviewed  CBC WITH DIFFERENTIAL/PLATELET - Abnormal; Notable for the following:    RBC 2.94 (*)    Hemoglobin 9.3 (*)    HCT 27.6 (*)    Platelets 94 (*)    Neutrophils Relative % 84 (*)    All other components within normal limits  COMPREHENSIVE METABOLIC PANEL - Abnormal; Notable for the following:    Sodium 126 (*)    Potassium 5.6 (*)    CO2 12 (*)    Glucose, Bld 119 (*)    BUN 99 (*)    Creatinine, Ser 4.78 (*)    Calcium 8.1 (*)    Total Protein 5.8 (*)    Albumin 3.3 (*)    GFR calc non Af Amer  8 (*)    GFR calc Af Amer 9 (*)    All other components within normal limits  URINALYSIS, ROUTINE W REFLEX MICROSCOPIC (NOT AT [facility]) - Abnormal; Notable for the following:    APPearance CLOUDY (*)    Hgb urine dipstick TRACE (*)    Protein, ur 100 (*)    Leukocytes, UA MODERATE (*)    All other components within normal limits  CBC - Abnormal; Notable for the following:    RBC 2.73 (*)    Hemoglobin 8.9 (*)    HCT 25.5 (*)    Platelets 96 (*)    All other components within normal limits  BASIC METABOLIC PANEL - Abnormal; Notable for the following:    Sodium 128 (*)    Potassium 5.3 (*)    CO2 13 (*)    Glucose, Bld 295 (*)    BUN 72 (*)    Creatinine, Ser 3.37 (*)    Calcium 5.9 (*)    GFR calc non Af Amer 12 (*)    GFR calc Af Amer 14 (*)    All other components within normal limits  MAGNESIUM - Abnormal; Notable for the following:    Magnesium 1.0 (*)    All other components within normal limits  PHOSPHORUS - Abnormal; Notable for the following:    Phosphorus 5.9 (*)    All other components within normal limits  GLUCOSE, CAPILLARY - Abnormal; Notable for the following:    Glucose-Capillary 208 (*)    All other components within normal limits  BASIC METABOLIC PANEL - Abnormal; Notable for the following:    Sodium 127 (*)    Potassium 6.0 (*)    CO2 13 (*)    Glucose, Bld 357 (*)    BUN 82 (*)    Creatinine, Ser 3.88 (*)    Calcium 6.4 (*)    GFR calc non Af Amer 10 (*)    GFR calc Af Amer 12 (*)    All other components within normal limits  GLUCOSE, CAPILLARY - Abnormal; Notable for the  following:    Glucose-Capillary 284 (*)    All other components within normal limits  GLUCOSE, CAPILLARY - Abnormal; Notable for the following:    Glucose-Capillary 287 (*)    All other components within normal limits  BLOOD GAS, ARTERIAL - Abnormal; Notable for the following:    pH, Arterial 7.285 (*)    pCO2 arterial 33.7 (*)    Bicarbonate 15.7 (*)    Acid-base deficit 9.8  (*)    All other components within normal limits  POCT I-STAT 3, ART BLOOD GAS (G3+) - Abnormal; Notable for the following:    pH, Arterial 7.093 (*)    pCO2 arterial 27.1 (*)    Bicarbonate 8.3 (*)    Acid-base deficit 20.0 (*)    All other components within normal limits  MRSA PCR SCREENING  CULTURE, BLOOD (ROUTINE X 2)  CULTURE, BLOOD (ROUTINE X 2)  URINE CULTURE  PROTIME-INR  CORTISOL  PROCALCITONIN  PROCALCITONIN  URINE MICROSCOPIC-ADD ON  GLUCOSE, CAPILLARY  CALCIUM, IONIZED  BASIC METABOLIC PANEL  I-STAT CG4 LACTIC ACID, ED  I-STAT TROPOININ, ED  I-STAT CG4 LACTIC ACID, ED  TYPE AND SCREEN  ABO/RH    Imaging Review Ct Abdomen Pelvis Wo Contrast  01/19/2015   CLINICAL DATA:  Nausea, vomiting, diarrhea starting Sunday.  EXAM: CT ABDOMEN AND PELVIS WITHOUT CONTRAST  TECHNIQUE: Multidetector CT imaging of the abdomen and pelvis was performed following the standard protocol without IV contrast.  COMPARISON:  None.  FINDINGS: There is no nephrolithiasis or hydroureteronephrosis bilaterally. The kidneys are normal. The liver, spleen, pancreas are normal. The adrenal glands are normal. There is a gallstone in the gallbladder without perigallbladder inflammatory change. There is atherosclerosis of the abdominal aorta without aneurysmal dilatation. There is no abdominal lymphadenopathy.  There is a hiatal hernia. There is no small bowel obstruction or diverticulitis. The appendix is not seen but no inflammation is noted around the cecum to suggest appendicitis.  Partial fluid-filled bladder is normal. Patient is status post prior hysterectomy. Minimal free fluid is identified in the pelvis.  There are small bilateral pleural effusions with atelectasis of the posterior lungs. Degenerative joint changes of the spine are noted.  IMPRESSION: No acute abnormality identified in the abdomen and pelvis. There is no bowel obstruction or diverticulitis.   Electronically Signed   By: Abelardo Diesel  M.D.   On: 01/19/2015 19:07   Dg Chest Port 1 View  01/20/2015   CLINICAL DATA:  Central line placement.  Hypertension.  Diabetes.  EXAM: PORTABLE CHEST - 1 VIEW  COMPARISON:  01/19/2015  FINDINGS: There is a new left jugular central line with tip in the low SVC. There is no pneumothorax. Persistent interstitial thickening without significant interval change. No confluent consolidation.  IMPRESSION: New left jugular central line.  No pneumothorax.   Electronically Signed   By: Andreas Newport M.D.   On: 01/20/2015 01:22   Dg Chest Portable 1 View  01/19/2015   CLINICAL DATA:  Shortness of breath and emesis  EXAM: PORTABLE CHEST - 1 VIEW  COMPARISON:  Oct 31, 2009  FINDINGS: The heart size and mediastinal contours are stable. The heart size is enlarged. The aorta is tortuous. There is diffuse bilateral increased pulmonary interstitium. There is no focal pneumonia or pleural effusion. The visualized skeletal structures are stable.  IMPRESSION: Cardiomegaly.  Interstitial edema.   Electronically Signed   By: Abelardo Diesel M.D.   On: 01/19/2015 20:34     EKG Interpretation   Date/Time:  Thursday January 19 2015 20:05:31 EDT Ventricular Rate:  78 PR Interval:  265 QRS Duration: 143 QT Interval:  441 QTC Calculation: 502 R Axis:   -62 Text Interpretation:  Atrial fibrillation Nonspecific IVCD with LAD  Anterior infarct, old ED PHYSICIAN INTERPRETATION AVAILABLE IN [place]  [place] Confirmed by TEST, Record ([id number]) on 01/20/2015 7:22:26 AM      EMERGENCY DEPARTMENT [place] ABD/AORTA EXAM Study: Limited Ultrasound of the Abdominal Aorta.  INDICATIONS:Hypotension, Abdominal pain and Age>55 Indication: Multiple views of the abdominal aorta are obtained from the diaphragmatic hiatus to the aortic bifurcation in transverse and sagittal planes with a multi- Frequency probe.  PERFORMED BY: Myself  IMAGES ARCHIVED?: Yes  FINDINGS: Maximum aortic dimensions are 2.0 cm  LIMITATIONS:  Body  habitus  INTERPRETATION:  No abdominal aortic aneurysm  COMMENT:  Visualization limited in superior AA due to bowel gas and body habitus.     CRITICAL CARE Performed by: Geronimo Boot, Dr. Wyvonnia Dusky  Total critical care time: 1 hr  Critical care time was exclusive of separately billable procedures and treating other patients.  Critical care was necessary to treat or prevent imminent or life-threatening deterioration.  Critical care was time spent personally by me on the following activities: development of treatment plan with patient and/or surrogate as well as nursing, discussions with consultants, evaluation of patient's response to treatment, examination of patient, obtaining history from patient or surrogate, ordering and performing treatments and interventions, ordering and review of laboratory studies, ordering and review of radiographic studies, pulse oximetry and re-evaluation of patient's condition.   MDM   Final diagnoses:  Hypovolemic shock  Acute renal failure, unspecified acute renal failure type  Hyperkalemia   Pt is an 81 yo female with a hx of CKD, GI bleed, HLD, and HTN presenting for hypotension form her PCP today.    On arrival pt profoundly hypotensive and symptomatic.  Immediately two large bore IVs placed and 2 L NS started.  Tachycardia and medications reviewed finding no BB to indicate use or overdose.  Core body temp of 92.3 and warming blanket applied and warm fluids given.  With hypothermia and hypotension code sepsis initiated and broad spectrum abx given and cultures performed.  Lactic acid initially negative.   Abdominal pain in setting of hypotension rose concern for AAA.  Bedside US found no AAA or free fluid.  Hx of GI bleed and no BRB per rectum and hemoccult negative. Hx of melena however.  Hgb 9.3 and unknown in anemic with hemoconcentration confounding results.    EKG performed showing a-fib but no ischemic changes from previous.  Troponin  negative.  Pt began developing CP while in ED and repeat EKG showed no changes.  No hypoxia, tachypnea, or tachycardia initially.  Became hypoxic however to 90%.  Chose not to CTA PE due to acute renal failure.  CT non-con abdomen with no acute abnormalities.   Pt continued to be hypotensive s/p 4L in the ED and started on periferal neosynephrine drip.   Critical care consulted and evaluated pt in the ED and admitted pt.   Unsure if hypovolemic shock due to blood loss or pure fluid loss from diarrhea and vomiting.  Sepsis and obstructive shock also possibilities.    If performed, labs, EKGs, and imaging were reviewed/interpreted by myself and my attending and incorporated into medical decision making.  Discussed pertinent finding with patient or caregiver prior to admission with no further questions.  Pt care supervised by my attending Dr. Wyvonnia Dusky.  Geronimo Boot, MD PGY-2  Emergency Medicine    Geronimo Boot, MD 01/20/15 [place], MD 01/21/15 [phone]

## 2015-01-19 NOTE — ED Notes (Signed)
Critical Care Dr in room

## 2015-01-19 NOTE — Progress Notes (Addendum)
ANTIBIOTIC CONSULT NOTE - INITIAL  Pharmacy Consult for Vancomycin + Zosyn (see addendum) Indication: r/o sepsis  Allergies  Allergen Reactions  . Levaquin [Levofloxacin In D5w] Anaphylaxis and Nausea Only    Shaking real bad on medicaton  . Herceptin [Trastuzumab] Rash    Patient has developed extensive body skin rash following each Herceptin treatment of increased presence following each treatment  that cleared with oral steroids  . Codeine Nausea And Vomiting    Pass out  . Sulfa Antibiotics Swelling    Patient Measurements: Height: 5\' 1"  (154.9 cm) Weight: 160 lb (72.576 kg) IBW/kg (Calculated) : 47.8  Vital Signs: Temp: 92.9 F (33.8 C) (07/28 1658) Temp Source: Rectal (07/28 1658) BP: 77/32 mmHg (07/28 1641) Pulse Rate: 75 (07/28 1641) Intake/Output from previous day:   Intake/Output from this shift:    Labs: No results for input(s): WBC, HGB, PLT, LABCREA, CREATININE in the last 72 hours. CrCl cannot be calculated (Patient has no serum creatinine result on file.). No results for input(s): VANCOTROUGH, VANCOPEAK, VANCORANDOM, GENTTROUGH, GENTPEAK, GENTRANDOM, TOBRATROUGH, TOBRAPEAK, TOBRARND, AMIKACINPEAK, AMIKACINTROU, AMIKACIN in the last 72 hours.   Microbiology: No results found for this or any previous visit (from the past 720 hour(s)).  Medical History: Past Medical History  Diagnosis Date  . Hyperlipidemia   . Hypertension   . Chronic kidney disease   . Osteoporosis   . GERD (gastroesophageal reflux disease)   . IBS (irritable bowel syndrome)   . Arthritis   . Neuromuscular disorder     peripheral neuropathy feet  . History of lower GI bleeding   . Breast cancer 03/15/13    right, 12 o'clock  . Diabetes mellitus without complication     Assessment: 89 YOF who presented to the [facility] on 7/28 with hypotension and dizziness. Pharmacy was consulted to start Vancomycin + Zosyn (per MD) for r/o sepsis.   The patient is noted to have an acute kidney  injury, SCr 4.78 (baseline 1.4). Will plan to load the patient with Vancomycin but will hold off on additional doses  Goal of Therapy:  Vancomycin trough level 15-20 mcg/ml  Plan:  1. Vancomycin 1250 mg IV x 1 dose to load  2. Will hold off on additional doses for now and will monitor the patient's renal function 3. Zosyn 3.375g IV x 1 ordered by the EDP, will follow-up on additional doses 4. Will continue to follow renal function, culture results, LOT, and antibiotic de-escalation plans   Alycia Rossetti, PharmD, BCPS Clinical Pharmacist Pager: [phone] 01/19/2015 5:38 PM    ----------------------------------------------------------------------------------------------- Addendum:  Lajean Silvius now ordered to continue per pharmacy.   Plan: 1. Zosyn 2.25g IV every 8 hours 2. Will follow renal function for any necessary dose adjustments.   Alycia Rossetti, PharmD, BCPS Clinical Pharmacist Pager: [phone] 01/19/2015 10:01 PM

## 2015-01-19 NOTE — ED Notes (Addendum)
In room with pt, pt started vomiting Zyron Deeley fluid, dr Wyvonnia Dusky notified and ordered 4 of zofran, also pt's o2 level at 89% pt placed on 2L [place] o2 now 96%

## 2015-01-19 NOTE — ED Notes (Signed)
Pt placed on bedpan to obtain urine specimen.

## 2015-01-19 NOTE — ED Notes (Signed)
Called pharmacy to inform them that neo-synephrine bag was almost out.

## 2015-01-19 NOTE — Progress Notes (Signed)
[facility] Progress Note Patient Name: Rebecca Hall DOB: 02-16-1932 MRN: [id number]   Date of Service  01/19/2015  HPI/Events of Note  Orders for treatment of hyperkalemia and patient on bicarb gtt.  No pH documented.  K was 5.6 at 1700.  Does have h/o of CRI.  eICU Interventions  Plan: 1. Check BMET to follow up on K level 2. Check ABG 3. Bladder scan as no UOP documented despite 5 liters of IVF     Intervention Category Intermediate Interventions: Electrolyte abnormality - evaluation and management  DETERDING,ELIZABETH 01/19/2015, 11:28 PM

## 2015-01-20 ENCOUNTER — Encounter (HOSPITAL_COMMUNITY): Payer: Self-pay

## 2015-01-20 ENCOUNTER — Inpatient Hospital Stay (HOSPITAL_COMMUNITY): Payer: PPO

## 2015-01-20 DIAGNOSIS — E872 Acidosis, unspecified: Secondary | ICD-10-CM | POA: Diagnosis present

## 2015-01-20 DIAGNOSIS — N179 Acute kidney failure, unspecified: Secondary | ICD-10-CM | POA: Diagnosis present

## 2015-01-20 DIAGNOSIS — E875 Hyperkalemia: Secondary | ICD-10-CM | POA: Diagnosis present

## 2015-01-20 DIAGNOSIS — Z452 Encounter for adjustment and management of vascular access device: Secondary | ICD-10-CM

## 2015-01-20 DIAGNOSIS — J9690 Respiratory failure, unspecified, unspecified whether with hypoxia or hypercapnia: Secondary | ICD-10-CM | POA: Diagnosis present

## 2015-01-20 LAB — URINE MICROSCOPIC-ADD ON

## 2015-01-20 LAB — PHOSPHORUS: Phosphorus: 5.9 mg/dL — ABNORMAL HIGH (ref 2.5–4.6)

## 2015-01-20 LAB — BASIC METABOLIC PANEL
Anion gap: 12 (ref 5–15)
Anion gap: 10 (ref 5–15)
Anion gap: 9 (ref 5–15)
BUN: 82 mg/dL — ABNORMAL HIGH (ref 6–20)
BUN: 72 mg/dL — ABNORMAL HIGH (ref 6–20)
BUN: 59 mg/dL — ABNORMAL HIGH (ref 6–20)
CO2: 13 mmol/L — ABNORMAL LOW (ref 22–32)
CO2: 13 mmol/L — ABNORMAL LOW (ref 22–32)
CO2: 24 mmol/L (ref 22–32)
Calcium: 6.4 mg/dL — CL (ref 8.9–10.3)
Calcium: 5.9 mg/dL — CL (ref 8.9–10.3)
Calcium: 6.7 mg/dL — ABNORMAL LOW (ref 8.9–10.3)
Chloride: 102 mmol/L (ref 101–111)
Chloride: 105 mmol/L (ref 101–111)
Chloride: 104 mmol/L (ref 101–111)
Creatinine, Ser: 3.88 mg/dL — ABNORMAL HIGH (ref 0.44–1.00)
Creatinine, Ser: 3.37 mg/dL — ABNORMAL HIGH (ref 0.44–1.00)
Creatinine, Ser: 2.85 mg/dL — ABNORMAL HIGH (ref 0.44–1.00)
GFR calc Af Amer: 12 mL/min — ABNORMAL LOW (ref >60)
GFR calc Af Amer: 14 mL/min — ABNORMAL LOW (ref >60)
GFR calc Af Amer: 17 mL/min — ABNORMAL LOW (ref >60)
GFR calc non Af Amer: 10 mL/min — ABNORMAL LOW (ref >60)
GFR calc non Af Amer: 12 mL/min — ABNORMAL LOW (ref >60)
GFR calc non Af Amer: 14 mL/min — ABNORMAL LOW (ref >60)
Glucose, Bld: 357 mg/dL — ABNORMAL HIGH (ref 65–99)
Glucose, Bld: 295 mg/dL — ABNORMAL HIGH (ref 65–99)
Glucose, Bld: 119 mg/dL — ABNORMAL HIGH (ref 65–99)
Potassium: 6 mmol/L — ABNORMAL HIGH (ref 3.5–5.1)
Potassium: 5.3 mmol/L — ABNORMAL HIGH (ref 3.5–5.1)
Potassium: 4.5 mmol/L (ref 3.5–5.1)
Sodium: 127 mmol/L — ABNORMAL LOW (ref 135–145)
Sodium: 128 mmol/L — ABNORMAL LOW (ref 135–145)
Sodium: 137 mmol/L (ref 135–145)

## 2015-01-20 LAB — URINALYSIS, ROUTINE W REFLEX MICROSCOPIC
Bilirubin Urine: NEGATIVE
Glucose, UA: NEGATIVE mg/dL
Ketones, ur: NEGATIVE mg/dL
Nitrite: NEGATIVE
Protein, ur: 100 mg/dL — AB
Specific Gravity, Urine: 1.011 (ref 1.005–1.030)
Urobilinogen, UA: 0.2 mg/dL (ref 0.0–1.0)
pH: 5 (ref 5.0–8.0)

## 2015-01-20 LAB — POCT I-STAT 3, ART BLOOD GAS (G3+)
Acid-base deficit: 20 mmol/L — ABNORMAL HIGH (ref 0.0–2.0)
Bicarbonate: 8.3 mEq/L — ABNORMAL LOW (ref 20.0–24.0)
O2 Saturation: 95 %
Patient temperature: 97.5
TCO2: 9 mmol/L (ref 0–100)
pCO2 arterial: 27.1 mmHg — ABNORMAL LOW (ref 35.0–45.0)
pH, Arterial: 7.093 — CL (ref 7.350–7.450)
pO2, Arterial: 98 mmHg (ref 80.0–100.0)

## 2015-01-20 LAB — BLOOD GAS, ARTERIAL
Acid-base deficit: 9.8 mmol/L — ABNORMAL HIGH (ref 0.0–2.0)
Bicarbonate: 15.7 mEq/L — ABNORMAL LOW (ref 20.0–24.0)
Drawn by: 398991
O2 Content: 2 L/min
O2 Saturation: 96.8 %
Patient temperature: 97.5
TCO2: 16.7 mmol/L (ref 0–100)
pCO2 arterial: 33.7 mmHg — ABNORMAL LOW (ref 35.0–45.0)
pH, Arterial: 7.285 — ABNORMAL LOW (ref 7.350–7.450)
pO2, Arterial: 90.2 mmHg (ref 80.0–100.0)

## 2015-01-20 LAB — GLUCOSE, CAPILLARY
Glucose-Capillary: 113 mg/dL — ABNORMAL HIGH (ref 65–99)
Glucose-Capillary: 284 mg/dL — ABNORMAL HIGH (ref 65–99)
Glucose-Capillary: 287 mg/dL — ABNORMAL HIGH (ref 65–99)
Glucose-Capillary: 71 mg/dL (ref 65–99)
Glucose-Capillary: 78 mg/dL (ref 65–99)

## 2015-01-20 LAB — CBC
HCT: 25.5 % — ABNORMAL LOW (ref 36.0–46.0)
Hemoglobin: 8.9 g/dL — ABNORMAL LOW (ref 12.0–15.0)
MCH: 32.6 pg (ref 26.0–34.0)
MCHC: 34.9 g/dL (ref 30.0–36.0)
MCV: 93.4 fL (ref 78.0–100.0)
Platelets: 96 10*3/uL — ABNORMAL LOW (ref 150–400)
RBC: 2.73 MIL/uL — ABNORMAL LOW (ref 3.87–5.11)
RDW: 14.9 % (ref 11.5–15.5)
WBC: 6.7 10*3/uL (ref 4.0–10.5)

## 2015-01-20 LAB — MAGNESIUM: Magnesium: 1 mg/dL — ABNORMAL LOW (ref 1.7–2.4)

## 2015-01-20 LAB — MRSA PCR SCREENING: MRSA by PCR: NEGATIVE

## 2015-01-20 LAB — CORTISOL: Cortisol, Plasma: 46.7 ug/dL

## 2015-01-20 LAB — PROCALCITONIN: Procalcitonin: <0.1 ng/mL

## 2015-01-20 MED ORDER — PHENYLEPHRINE HCL 10 MG/ML IJ SOLN
30.0000 ug/min | INTRAMUSCULAR | Status: DC
  Administered 2015-01-20: 100 ug/min via INTRAVENOUS
  Filled 2015-01-20: qty 4

## 2015-01-20 MED ORDER — IBUPROFEN 200 MG PO TABS
600.0000 mg | ORAL_TABLET | ORAL | Status: DC | PRN
Start: 2015-01-20 — End: 2015-01-22

## 2015-01-20 MED ORDER — SODIUM BICARBONATE 8.4 % IV SOLN
100.0000 meq | Freq: Once | INTRAVENOUS | Status: AC
  Administered 2015-01-20: 100 meq via INTRAVENOUS
  Filled 2015-01-20: qty 50

## 2015-01-20 MED ORDER — CHLORHEXIDINE GLUCONATE 0.12 % MT SOLN
15.0000 mL | Freq: Two times a day (BID) | OROMUCOSAL | Status: DC
  Administered 2015-01-20 – 2015-01-27 (×13): 15 mL via OROMUCOSAL
  Filled 2015-01-20 (×13): qty 15

## 2015-01-20 MED ORDER — CETYLPYRIDINIUM CHLORIDE 0.05 % MT LIQD
7.0000 mL | Freq: Two times a day (BID) | OROMUCOSAL | Status: DC
  Administered 2015-01-20: 7 mL via OROMUCOSAL

## 2015-01-20 MED ORDER — FENTANYL CITRATE (PF) 100 MCG/2ML IJ SOLN
25.0000 ug | INTRAMUSCULAR | Status: DC | PRN
  Administered 2015-01-20: 50 ug via INTRAVENOUS
  Filled 2015-01-20: qty 2

## 2015-01-20 MED ORDER — CETYLPYRIDINIUM CHLORIDE 0.05 % MT LIQD
7.0000 mL | Freq: Two times a day (BID) | OROMUCOSAL | Status: DC
  Administered 2015-01-21 – 2015-01-26 (×8): 7 mL via OROMUCOSAL

## 2015-01-20 MED ORDER — DEXTROSE 5 % IV SOLN
2.0000 ug/min | INTRAVENOUS | Status: DC
  Administered 2015-01-20: 5 ug/min via INTRAVENOUS
  Filled 2015-01-20: qty 16

## 2015-01-20 MED ORDER — MAGNESIUM SULFATE 2 GM/50ML IV SOLN: 2.0000 g | Freq: Once | INTRAVENOUS | Status: DC

## 2015-01-20 NOTE — Progress Notes (Signed)
[name] DOB: 1932/03/15 MRN: [id number]   Date of Service  01/20/2015  HPI/Events of Note  Continued hypotension despite NEO gtt.  Now with CVL.  Also with metabolic acidosis with pH of 7.03/20/97/9  eICU Interventions  Plan: Continue bicarb gtt 2 amps of bicarb IVP Start NE gtt for BP support Aline for HD monitoring     Intervention Category Major Interventions: Acid-Base disturbance - evaluation and management;Hypotension - evaluation and management  Jhayden Demuro 01/20/2015, 12:23 AM

## 2015-01-20 NOTE — Progress Notes (Deleted)
PULMONARY / CRITICAL CARE MEDICINE   Name: Rebecca Hall MRN: [id number] DOB: [birth year]    ADMISSION DATE: 01/19/2015 CONSULTATION DATE: 01/19/2015  REFERRING MD : EDP  CHIEF COMPLAINT: Hypotension  INITIAL PRESENTATION: 81 y.o. F brought to [facility] ED with hypotension, N/V/D. In ED, remained hypotensive despite 4L IVF; therefore, started on neosynephrine peripherally. PCCM called for admission.    STUDIES:  CXR 7/28 >>> CM with interstitial edema. CT A/P 7/28 >>> no acute process.  SIGNIFICANT EVENTS: 7/28 - admit with shock, favor hypovolemic.     SUBJECTIVE:   Admitted overnight with hypovolemic shock  VITAL SIGNS: Temp:  [92.9 F (33.8 C)-97.7 F (36.5 C)] 97.5 F (36.4 C) (07/29 0801) Pulse Rate:  [58-105] 82 (07/29 0700) Resp:  [13-22] 22 (07/29 0700) BP: (58-140)/(26-81) 119/48 mmHg (07/29 0700) SpO2:  [90 %-100 %] 96 % (07/29 0700) Arterial Line BP: (104-144)/(31-55) 131/55 mmHg (07/29 0700) Weight:  [160 lb (72.576 kg)-187 lb 2.7 oz (84.9 kg)] 187 lb 2.7 oz (84.9 kg) (07/29 0205) HEMODYNAMICS: CVP:  [9 mmHg-15 mmHg] 9 mmHg VENTILATOR SETTINGS:   INTAKE / OUTPUT:  Intake/Output Summary (Last 24 hours) at 01/20/15 0823 Last data filed at 01/20/15 0641  Gross per 24 hour  Intake 6537.6 ml  Output     35 ml  Net 6502.6 ml    PHYSICAL EXAMINATION: General:  NAD Neuro:  Alert oriented HEENT:  NCAT Cardiovascular:  RRR Lungs:  CTAB Abdomen:  Soft, non tender + BS Musculoskeletal:  No deformities Skin:  No rashes noted  LABS:  CBC  Recent Labs Lab 01/19/15 1700 01/20/15 0450  WBC 6.1 6.7  HGB 9.3* 8.9*  HCT 27.6* 25.5*  PLT 94* 96*   Coag's  Recent Labs Lab 01/19/15 1700  INR 1.11   BMET  Recent Labs Lab 01/19/15 1700 01/20/15 0050 01/20/15 0450  NA 126* 127* 128*  K 5.6* 6.0* 5.3*  CL 102 102 105  CO2 12* 13* 13*  BUN 99* 82* 72*  CREATININE 4.78* 3.88* 3.37*  GLUCOSE 119* 357* 295*   Electrolytes  Recent  Labs Lab 01/19/15 1700 01/20/15 0050 01/20/15 0450  CALCIUM 8.1* 6.4* 5.9*  MG  --   --  1.0*  PHOS  --   --  5.9*   Sepsis Markers  Recent Labs Lab 01/19/15 1710 01/19/15 2011 01/19/15 2227 01/20/15 0450  LATICACIDVEN 1.02 0.56  --   --   PROCALCITON  --   --  <0.10 <0.10   ABG  Recent Labs Lab 01/20/15 0013  PHART 7.093*  PCO2ART 27.1*  PO2ART 98.0   Liver Enzymes  Recent Labs Lab 01/19/15 1700  AST 23  ALT 21  ALKPHOS 53  BILITOT 0.3  ALBUMIN 3.3*   Cardiac Enzymes No results for input(s): TROPONINI, PROBNP in the last 168 hours. Glucose  Recent Labs Lab 01/19/15 2224 01/20/15 0017 01/20/15 0357  GLUCAP 208* 284* 287*    Imaging Ct Abdomen Pelvis Wo Contrast  01/19/2015   CLINICAL DATA:  Nausea, vomiting, diarrhea starting Sunday.  EXAM: CT ABDOMEN AND PELVIS WITHOUT CONTRAST  TECHNIQUE: Multidetector CT imaging of the abdomen and pelvis was performed following the standard protocol without IV contrast.  COMPARISON:  None.  FINDINGS: There is no nephrolithiasis or hydroureteronephrosis bilaterally. The kidneys are normal. The liver, spleen, pancreas are normal. The adrenal glands are normal. There is a gallstone in the gallbladder without perigallbladder inflammatory change. There is atherosclerosis of the abdominal aorta without aneurysmal dilatation. There is no abdominal  lymphadenopathy.  There is a hiatal hernia. There is no small bowel obstruction or diverticulitis. The appendix is not seen but no inflammation is noted around the cecum to suggest appendicitis.  Partial fluid-filled bladder is normal. Patient is status post prior hysterectomy. Minimal free fluid is identified in the pelvis.  There are small bilateral pleural effusions with atelectasis of the posterior lungs. Degenerative joint changes of the spine are noted.  IMPRESSION: No acute abnormality identified in the abdomen and pelvis. There is no bowel obstruction or diverticulitis.    Electronically Signed   By: Abelardo Diesel M.D.   On: 01/19/2015 19:07   Dg Chest Port 1 View  01/20/2015   CLINICAL DATA:  Central line placement.  Hypertension.  Diabetes.  EXAM: PORTABLE CHEST - 1 VIEW  COMPARISON:  01/19/2015  FINDINGS: There is a new left jugular central line with tip in the low SVC. There is no pneumothorax. Persistent interstitial thickening without significant interval change. No confluent consolidation.  IMPRESSION: New left jugular central line.  No pneumothorax.   Electronically Signed   By: Andreas Newport M.D.   On: 01/20/2015 01:22   Dg Chest Portable 1 View  01/19/2015   CLINICAL DATA:  Shortness of breath and emesis  EXAM: PORTABLE CHEST - 1 VIEW  COMPARISON:  Oct 31, 2009  FINDINGS: The heart size and mediastinal contours are stable. The heart size is enlarged. The aorta is tortuous. There is diffuse bilateral increased pulmonary interstitium. There is no focal pneumonia or pleural effusion. The visualized skeletal structures are stable.  IMPRESSION: Cardiomegaly.  Interstitial edema.   Electronically Signed   By: Abelardo Diesel M.D.   On: 01/19/2015 20:34     ASSESSMENT / PLAN:  CARDIOVASCULAR CVL pending 7/28 >>> A:  Shock - favor hypovolemic due to decreased PO intake over past few days combined with N/V/D. Lactate reassuring. Hx HTN, HLD. P:  Additional 500cc IVF bolus. Place CVL. Neosynephrine to maintain goal MAP > 65. Monitor CVP's - goal 8 - 12. Cortisol reassuring Consider echo if no improvement. Hold outpatient aspirin, atorvastatin, furosemide, lisinopril.  PULMONARY A: Acute hypoxic respiratory failure. Mild pulmonary edema R > L. P:  Continue supplemental O2 as needed to maintain SpO2 > 92%. Caution with aggressive IVF resuscitation. Pulmonary hygiene. CXR in AM.  RENAL A:  Hyponatremia - likely from decreased PO + AoCKD. Hyperkalemia. NAG acidosis - due to N/V/D. AoCKD - stage II. Hypocalcemia. P:  HCO3 @ 100. 30g  kayexalate. 1g Ca gluconate. F/u Ca today  GASTROINTESTINAL A:  GERD. IBS. Hx LGIB. Nutrition. P:  Continue PPI. NPO.  HEMATOLOGIC / ONCOLOGIC A:  Hx breast CA s/p right lumpectomy - followed by Dr. Jana Hakim. VTE Prophylaxis. P:  Continue anastrozole. SCD's / Heparin. CBC in AM.  INFECTIOUS A:  Shock - favor hypovolemic but can not rule out septic at this point. UA concerning for UTI P:  BCx2 7/28 > UCx 7/28 > Abx: Vanc, start date 7/28, day 1/x. Abx: Zosyn, start date 7/28, day 1/x. PCT <0.1 reassuring,    ENDOCRINE A:  DM.  P:  SSI.  NEUROLOGIC A:  Peripheral neuropathy. P:  Hold outpatient diazepam, gabapentin, lorazepam.   Family updated: Husband at bedside.           Aram Domzalski A. Lincoln Brigham MD, [place] [facility] Resident PGY-2 Pager [phone]   01/20/2015, 8:23 AM

## 2015-01-20 NOTE — Progress Notes (Signed)
PULMONARY / CRITICAL CARE MEDICINE   Name: Rebecca Hall MRN: [id number] DOB: Nov 03, 1931    ADMISSION DATE:  01/19/2015 CONSULTATION DATE:  01/19/15  REFERRING MD :  EDP  CHIEF COMPLAINT: Hypotension  INITIAL PRESENTATION:  81 y.o. F brought to [facility] ED with hypotension, N/V/D. In ED, remained hypotensive despite 4L IVF; therefore, started on neosynephrine peripherally. PCCM called for admission.  STUDIES:  CXR 7/28 >>> CM with interstitial edema. CT A/P 7/28 >>> no acute process. CXR 7/29 >>> left IJ in place  SIGNIFICANT EVENTS: 7/28 - admit with shock, favor hypovolemic 7/29 - arterial line placed  SUBJECTIVE:  Hypomagnesemia noted overnight, repleted. Also noted lower back pain, PRN fentanyl initiated. Arterial line inserted for BP monitoring due to continued hypotension despite Neo. Additional bicarb given in addition to drip and Levophed drip initiated.  VITAL SIGNS: Temp:  [92.9 F (33.8 C)-97.7 F (36.5 C)] 97.7 F (36.5 C) (07/29 0359) Pulse Rate:  [58-105] 72 (07/29 0600) Resp:  [13-22] 22 (07/29 0600) BP: (58-140)/(26-81) 108/37 mmHg (07/29 0600) SpO2:  [90 %-100 %] 98 % (07/29 0600) Arterial Line BP: (104-144)/(31-48) 104/47 mmHg (07/29 0600) Weight:  [160 lb (72.576 kg)-187 lb 2.7 oz (84.9 kg)] 187 lb 2.7 oz (84.9 kg) (07/29 0205) HEMODYNAMICS: CVP:  [10 mmHg-15 mmHg] 10 mmHg VENTILATOR SETTINGS:   INTAKE / OUTPUT:  Intake/Output Summary (Last 24 hours) at 01/20/15 0705 Last data filed at 01/20/15 0641  Gross per 24 hour  Intake 6537.6 ml  Output     35 ml  Net 6502.6 ml    PHYSICAL EXAMINATION: General:  81yo female resting comfortably in no apparent distress Neuro:  AAOx3, no focal deficits noted HEENT:  [place]/AT, PERRLA, dry MM Cardiovascular:  S1 and S2 noted, no murmurs, RRR Lungs:  CTAB, equal breath sounds, no increased work of breathing Abdomen:  Soft and nondistended, bowel sounds noted, nontender Musculoskeletal: No edema Skin:  No  rashes  LABS:  CBC  Recent Labs Lab 01/19/15 1700 01/20/15 0450  WBC 6.1 6.7  HGB 9.3* 8.9*  HCT 27.6* 25.5*  PLT 94* 96*   Coag's  Recent Labs Lab 01/19/15 1700  INR 1.11   BMET  Recent Labs Lab 01/19/15 1700 01/20/15 0050 01/20/15 0450  NA 126* 127* 128*  K 5.6* 6.0* 5.3*  CL 102 102 105  CO2 12* 13* 13*  BUN 99* 82* 72*  CREATININE 4.78* 3.88* 3.37*  GLUCOSE 119* 357* 295*   Electrolytes  Recent Labs Lab 01/19/15 1700 01/20/15 0050 01/20/15 0450  CALCIUM 8.1* 6.4* 5.9*  MG  --   --  1.0*  PHOS  --   --  5.9*   Sepsis Markers  Recent Labs Lab 01/19/15 1710 01/19/15 2011 01/19/15 2227 01/20/15 0450  LATICACIDVEN 1.02 0.56  --   --   PROCALCITON  --   --  <0.10 <0.10   ABG  Recent Labs Lab 01/20/15 0013  PHART 7.093*  PCO2ART 27.1*  PO2ART 98.0   Liver Enzymes  Recent Labs Lab 01/19/15 1700  AST 23  ALT 21  ALKPHOS 53  BILITOT 0.3  ALBUMIN 3.3*   Cardiac Enzymes No results for input(s): TROPONINI, PROBNP in the last 168 hours. Glucose  Recent Labs Lab 01/19/15 2224 01/20/15 0017 01/20/15 0357  GLUCAP 208* 284* 287*    Imaging Ct Abdomen Pelvis Wo Contrast  01/19/2015   CLINICAL DATA:  Nausea, vomiting, diarrhea starting Sunday.  EXAM: CT ABDOMEN AND PELVIS WITHOUT CONTRAST  TECHNIQUE: Multidetector CT imaging  of the abdomen and pelvis was performed following the standard protocol without IV contrast.  COMPARISON:  None.  FINDINGS: There is no nephrolithiasis or hydroureteronephrosis bilaterally. The kidneys are normal. The liver, spleen, pancreas are normal. The adrenal glands are normal. There is a gallstone in the gallbladder without perigallbladder inflammatory change. There is atherosclerosis of the abdominal aorta without aneurysmal dilatation. There is no abdominal lymphadenopathy.  There is a hiatal hernia. There is no small bowel obstruction or diverticulitis. The appendix is not seen but no inflammation is noted  around the cecum to suggest appendicitis.  Partial fluid-filled bladder is normal. Patient is status post prior hysterectomy. Minimal free fluid is identified in the pelvis.  There are small bilateral pleural effusions with atelectasis of the posterior lungs. Degenerative joint changes of the spine are noted.  IMPRESSION: No acute abnormality identified in the abdomen and pelvis. There is no bowel obstruction or diverticulitis.   Electronically Signed   By: Abelardo Diesel M.D.   On: 01/19/2015 19:07   Dg Chest Port 1 View  01/20/2015   CLINICAL DATA:  Central line placement.  Hypertension.  Diabetes.  EXAM: PORTABLE CHEST - 1 VIEW  COMPARISON:  01/19/2015  FINDINGS: There is a new left jugular central line with tip in the low SVC. There is no pneumothorax. Persistent interstitial thickening without significant interval change. No confluent consolidation.  IMPRESSION: New left jugular central line.  No pneumothorax.   Electronically Signed   By: Andreas Newport M.D.   On: 01/20/2015 01:22   Dg Chest Portable 1 View  01/19/2015   CLINICAL DATA:  Shortness of breath and emesis  EXAM: PORTABLE CHEST - 1 VIEW  COMPARISON:  Oct 31, 2009  FINDINGS: The heart size and mediastinal contours are stable. The heart size is enlarged. The aorta is tortuous. There is diffuse bilateral increased pulmonary interstitium. There is no focal pneumonia or pleural effusion. The visualized skeletal structures are stable.  IMPRESSION: Cardiomegaly.  Interstitial edema.   Electronically Signed   By: Abelardo Diesel M.D.   On: 01/19/2015 20:34     ASSESSMENT / PLAN:  CARDIOVASCULAR CVL 7/29 >>> A:  Shock - favor hypovolemic due to decreased PO intake over past few days combined with N/V/D. Lactate reassuring. Hx HTN, HLD.  P:  Neosynephrine to maintain goal MAP > 65 Monitor CVP's - goal 8 - 12. Consider echo if no improvement. Neo, Levophed, likley to dc both today Hold outpatient aspirin, atorvastatin, furosemide,  lisinopril.  PULMONARY A: Acute hypoxic respiratory failure. Mild pulmonary edema R > L. P:  Continue supplemental O2 as needed to maintain SpO2 > 92%.  RENAL A:  Hyponatremia (128) - likely from decreased PO + AoCKD. Hyperkalemia (6>5.3) NAG acidosis - due to N/V/D. AoCKD (Cr3.37) - stage II (baseline 1.4) Hypocalcemia. Hypomagnesemia (1) P:  HCO3 @ 100 for NONAG 30g kayexalate 1g Ca gluconate. BMP in AM. Replete electrolytes as indicated--Magnesium repleted  GASTROINTESTINAL A:  GERD. IBS. Hx LGIB. Nutrition. diarrhea ( r./o viral), resolved P:  Protonix NPO--Advance diet as tolerated No cdiff support Bicarb If stools increase, send stool leuko, o and p, cdiff, enteric path  HEMATOLOGIC / ONCOLOGIC A:  Hx breast CA s/p right lumpectomy - followed by Dr. Jana Hakim. VTE Prophylaxis. Anemia (8.9) Thrombocytoenia (96) P:  Continue anastrozole. SCD's / Heparin. Follow CBC  INFECTIOUS A:  Shock - favor hypovolemic but can not rule out septic at this point. Resolving stool P:  BCx2 7/28 > UCx 7/28 > Abx:  Vanc, start date 7/28>>>7/29 Abx: Zosyn, start date 7/28>>>  See GI  ENDOCRINE A:  DM.  P:  SSI.  NEUROLOGIC A:  Peripheral neuropathy. P:  Hold outpatient diazepam, gabapentin, lorazepam. Fentanyl PRN  Family updated: Husband at bedside.  Interdisciplinary Family Meeting v Palliative Care Meeting: Due by: 01/25/15. Junie Panning, DO Family Medicine, PGY-2 01/20/2015, 7:05 AM   STAFF NOTE: I, Merrie Roof, MD FACP have personally reviewed patient's available data, including medical history, events of note, physical examination and test results as part of my evaluation. I have discussed with resident/NP and other care providers such as pharmacist, RN and RRT. In addition, I personally evaluated patient and elicited key findings of: improving drastically, no stools, abdo soft, no distress, levo at 5 mic, neo to  off as goal, MAp goals met, increase bicarb for continued NONAG, dc vanc, zosyn likely to dc in am if remains culture neg and resolved, like to dc a line and cvp this afternoon, may be able to dc from unit in pm, assess stools if stool re noted The patient is critically ill with multiple organ systems failure and requires high complexity decision making for assessment and support, frequent evaluation and titration of therapies, application of advanced monitoring technologies and extensive interpretation of multiple databases.   Critical Care Time devoted to patient care services described in this note is30 Minutes. This time reflects time of care of this signee: Merrie Roof, MD FACP. This critical care time does not reflect procedure time, or teaching time or supervisory time of PA/NP/Med student/Med Resident etc but could involve care discussion time. Rest per NP/medical resident whose note is outlined above and that I agree with   Lavon Paganini. Titus Mould, MD, [place] Pgr: [place] [facility] 01/20/2015 10:47 AM

## 2015-01-20 NOTE — Progress Notes (Signed)
[place] Progress Note Patient Name: Rebecca Hall DOB: 1931/10/17 MRN: [id number]   Date of Service  01/20/2015  HPI/Events of Note  hypomag  eICU Interventions  Magnesium replaced     Intervention Category Intermediate Interventions: Electrolyte abnormality - evaluation and management  [name] 01/20/2015, 5:42 AM

## 2015-01-20 NOTE — Procedures (Signed)
Central Venous Catheter Insertion Procedure Note Rebecca Hall [id number] 1932/03/24  Procedure: Insertion of Central Venous Catheter Indications: Assessment of intravascular volume, Drug and/or fluid administration and Frequent blood sampling  Procedure Details Consent: Risks of procedure as well as the alternatives and risks of each were explained to the (patient/caregiver).  Consent for procedure obtained. Time Out: Verified patient identification, verified procedure, site/side was marked, verified correct patient position, special equipment/implants available, medications/allergies/relevent history reviewed, required imaging and test results available.  Performed  Maximum sterile technique was used including antiseptics, cap, gloves, gown, hand hygiene, mask and sheet. Skin prep: Chlorhexidine; local anesthetic administered A antimicrobial bonded/coated triple lumen catheter was placed in the left internal jugular vein using the Seldinger technique.  Evaluation Blood flow good Complications: No apparent complications Patient did tolerate procedure well. Chest X-ray ordered to verify placement.  CXR: pending.  Procedure performed under direct ultrasound guidance for real time vessel cannulation.      [name], [place] [facility] Pager: [phone]  or [phone] 01/20/2015, 12:00 AM   required

## 2015-01-20 NOTE — Progress Notes (Signed)
[place] Progress Note Patient Name: KIM CALVO DOB: 12/05/1931 MRN: [id number]   Date of Service  01/20/2015  HPI/Events of Note  Pain in lower back.    eICU Interventions  Plan: PRN fentanyl 25 to 50 mcg IV q2hours     Intervention Category Intermediate Interventions: Pain - evaluation and management  Naomii Kreger 01/20/2015, 1:49 AM

## 2015-01-20 NOTE — Procedures (Signed)
Arterial Catheter Insertion Procedure Note Rebecca Hall [id number] Oct 07, 1931  Procedure: Insertion of Arterial Catheter  Indications: Blood pressure monitoring  Procedure Details Consent: Risks of procedure as well as the alternatives and risks of each were explained to the (patient/caregiver).  Consent for procedure obtained. Time Out: Verified patient identification, verified procedure, site/side was marked, verified correct patient position, special equipment/implants available, medications/allergies/relevent history reviewed, required imaging and test results available.  Performed  Maximum sterile technique was used including antiseptics, cap, gloves, gown, hand hygiene, mask and sheet. Skin prep: Chlorhexidine; local anesthetic administered 20 gauge catheter was inserted into right radial artery using the Seldinger technique.  Evaluation Blood flow good; BP tracing good. Complications: No apparent complications.   Marlowe Aschoff 01/20/2015

## 2015-01-21 DIAGNOSIS — N170 Acute kidney failure with tubular necrosis: Secondary | ICD-10-CM

## 2015-01-21 LAB — GLUCOSE, CAPILLARY
Glucose-Capillary: 102 mg/dL — ABNORMAL HIGH (ref 65–99)
Glucose-Capillary: 108 mg/dL — ABNORMAL HIGH (ref 65–99)
Glucose-Capillary: 112 mg/dL — ABNORMAL HIGH (ref 65–99)
Glucose-Capillary: 114 mg/dL — ABNORMAL HIGH (ref 65–99)
Glucose-Capillary: 115 mg/dL — ABNORMAL HIGH (ref 65–99)
Glucose-Capillary: 121 mg/dL — ABNORMAL HIGH (ref 65–99)
Glucose-Capillary: 132 mg/dL — ABNORMAL HIGH (ref 65–99)
Glucose-Capillary: 134 mg/dL — ABNORMAL HIGH (ref 65–99)
Glucose-Capillary: 147 mg/dL — ABNORMAL HIGH (ref 65–99)
Glucose-Capillary: 69 mg/dL (ref 65–99)
Glucose-Capillary: 69 mg/dL (ref 65–99)

## 2015-01-21 LAB — URINE CULTURE: Culture: NO GROWTH

## 2015-01-21 LAB — BASIC METABOLIC PANEL
Anion gap: 7 (ref 5–15)
BUN: 38 mg/dL — ABNORMAL HIGH (ref 6–20)
CO2: 33 mmol/L — ABNORMAL HIGH (ref 22–32)
Calcium: 7 mg/dL — ABNORMAL LOW (ref 8.9–10.3)
Chloride: 105 mmol/L (ref 101–111)
Creatinine, Ser: 2.1 mg/dL — ABNORMAL HIGH (ref 0.44–1.00)
GFR calc Af Amer: 24 mL/min — ABNORMAL LOW (ref >60)
GFR calc non Af Amer: 21 mL/min — ABNORMAL LOW (ref >60)
Glucose, Bld: 125 mg/dL — ABNORMAL HIGH (ref 65–99)
Potassium: 4.3 mmol/L (ref 3.5–5.1)
Sodium: 145 mmol/L (ref 135–145)

## 2015-01-21 LAB — PROCALCITONIN: Procalcitonin: <0.1 ng/mL

## 2015-01-21 MED ORDER — DEXTROSE 50 % IV SOLN
INTRAVENOUS | Status: AC
  Filled 2015-01-21: qty 50

## 2015-01-21 MED ORDER — DEXTROSE 50 % IV SOLN
25.0000 mL | Freq: Once | INTRAVENOUS | Status: AC
  Administered 2015-01-21: 25 mL via INTRAVENOUS
  Filled 2015-01-21: qty 50

## 2015-01-21 NOTE — Progress Notes (Signed)
Pt transported to [facility] via wheel chair, report given to [employer].  Babs Bertin RN

## 2015-01-21 NOTE — Progress Notes (Signed)
PULMONARY / CRITICAL CARE MEDICINE   Name: Rebecca Hall MRN: [id number] DOB: 12/04/1931    ADMISSION DATE:  01/19/2015 CONSULTATION DATE:  01/19/15  REFERRING MD :  EDP  CHIEF COMPLAINT: Hypotension  INITIAL PRESENTATION:  81 y.o. F brought to [facility] ED with hypotension, N/V/D. In ED, remained hypotensive despite 4L IVF; therefore, started on neosynephrine peripherally. PCCM called for admission.  STUDIES:  CXR 7/28 >>> CM with interstitial edema. CT A/P 7/28 >>> no acute process. CXR 7/29 >>> left IJ in place  SIGNIFICANT EVENTS: 7/28 - admit with shock, favor hypovolemic 7/29 - arterial line placed  SUBJECTIVE:  Hypomagnesemia noted overnight, repleted. Also noted lower back pain, PRN fentanyl initiated. Arterial line inserted for BP monitoring due to continued hypotension despite Neo. Additional bicarb given in addition to drip and Levophed drip initiated.  VITAL SIGNS: Temp:  [97.5 F (36.4 C)-98.6 F (37 C)] 98.6 F (37 C) (07/30 0801) Pulse Rate:  [66-97] 91 (07/30 1000) Resp:  [11-22] 20 (07/30 1000) BP: (90-127)/(35-73) 123/47 mmHg (07/30 1000) SpO2:  [89 %-100 %] 93 % (07/30 1000) Arterial Line BP: (103-183)/(37-59) 165/56 mmHg (07/30 1000) Weight:  [177 lb 11.1 oz (80.6 kg)] 177 lb 11.1 oz (80.6 kg) (07/30 0407) HEMODYNAMICS: CVP:  [6 mmHg-14 mmHg] 6 mmHg VENTILATOR SETTINGS:   INTAKE / OUTPUT:  Intake/Output Summary (Last 24 hours) at 01/21/15 1133 Last data filed at 01/21/15 1000  Gross per 24 hour  Intake 3301.73 ml  Output   5175 ml  Net -1873.27 ml    PHYSICAL EXAMINATION: General:  81yo female resting comfortably in no apparent distress Neuro:  AAOx3, no focal deficits noted HEENT:  [place]/AT, PERRLA, dry MM Cardiovascular:  S1 and S2 noted, no murmurs, RRR Lungs:  CTAB, equal breath sounds, no increased work of breathing Abdomen:  Soft and nondistended, bowel sounds noted, nontender Musculoskeletal: No edema Skin:  No  rashes  LABS:  CBC  Recent Labs Lab 01/19/15 1700 01/20/15 0450  WBC 6.1 6.7  HGB 9.3* 8.9*  HCT 27.6* 25.5*  PLT 94* 96*   Coag's  Recent Labs Lab 01/19/15 1700  INR 1.11   BMET  Recent Labs Lab 01/20/15 0050 01/20/15 0450 01/20/15 1928  NA 127* 128* 137  K 6.0* 5.3* 4.5  CL 102 105 104  CO2 13* 13* 24  BUN 82* 72* 59*  CREATININE 3.88* 3.37* 2.85*  GLUCOSE 357* 295* 119*   Electrolytes  Recent Labs Lab 01/20/15 0050 01/20/15 0450 01/20/15 1928  CALCIUM 6.4* 5.9* 6.7*  MG  --  1.0*  --   PHOS  --  5.9*  --    Sepsis Markers  Recent Labs Lab 01/19/15 1710 01/19/15 2011 01/19/15 2227 01/20/15 0450 01/21/15 0300  LATICACIDVEN 1.02 0.56  --   --   --   PROCALCITON  --   --  <0.10 <0.10 <0.10   ABG  Recent Labs Lab 01/20/15 0013 01/20/15 0945  PHART 7.093* 7.285*  PCO2ART 27.1* 33.7*  PO2ART 98.0 90.2   Liver Enzymes  Recent Labs Lab 01/19/15 1700  AST 23  ALT 21  ALKPHOS 53  BILITOT 0.3  ALBUMIN 3.3*   Cardiac Enzymes No results for input(s): TROPONINI, PROBNP in the last 168 hours. Glucose  Recent Labs Lab 01/20/15 1320 01/20/15 1543 01/20/15 1931 01/21/15 0004 01/21/15 0357 01/21/15 0803  GLUCAP 69 78 113* 134* 102* 121*    Imaging No results found.   ASSESSMENT / PLAN:  CARDIOVASCULAR CVL 7/29 >>> A:  Shock - favor hypovolemic due to decreased PO intake over past few days combined with N/V/D. Lactate reassuring. Hx HTN, HLD.  P:  Neosynephrine to maintain goal MAP > 65 off 7/30 Monitor CVP's - goal 8 - 12. Consider echo if no improvement. Neo, Levophed, likley to dc both today Hold outpatient aspirin, atorvastatin, furosemide, lisinopril.  PULMONARY A: Acute hypoxic respiratory failure. Mild pulmonary edema R > L. P:  Continue supplemental O2 as needed to maintain SpO2 > 92%.  RENAL A:  Hyponatremia (128) - likely from decreased PO + AoCKD. Hyperkalemia (6>5.3) NAG acidosis - due to  N/V/D. AoCKD (Cr3.37) - stage II (baseline 1.4) Hypocalcemia. Hypomagnesemia (1) P:  HCO3 @ 100 for NONAG 30g kayexalate 1g Ca gluconate. BMP in AM. Replete electrolytes as indicated--Magnesium repleted  GASTROINTESTINAL A:  GERD. IBS. Hx LGIB. Nutrition. diarrhea ( r./o viral), resolved P:  Protonix NPO--Advance diet as tolerated No cdiff support Bicarb, dc on 7/30 after check gap with new bmet orrdered If stools increase, send stool leuko, o and p, cdiff, enteric path  HEMATOLOGIC / ONCOLOGIC A:  Hx breast CA s/p right lumpectomy - followed by Dr. Jana Hakim. VTE Prophylaxis. Anemia (8.9) Thrombocytoenia (96) P:  Continue anastrozole. SCD's / Heparin. Follow CBC  INFECTIOUS A:  Shock - favor hypovolemic but can not rule out septic at this point. Off pressors, neg procalitonin  P:  BCx2 7/28 >NGx24hrs>> UCx 7/28 >neg Abx: Vanc, start date 7/28>>>7/29 Abx: Zosyn, start date 7/28>>> Procal <0.10 See GI  ENDOCRINE CBG (last 3)   Recent Labs  01/21/15 0004 01/21/15 0357 01/21/15 0803  GLUCAP 134* 102* 121*     A:  DM.  P:  SSI.  NEUROLOGIC A:  Peripheral neuropathy. Mild confusion P:  Hold outpatient diazepam, gabapentin, lorazepam. Fentanyl PRN  Family updated:  None at bedside  Interdisciplinary Family Meeting v Palliative Care Meeting: Due by: 01/25/15.   Richardson Landry Minor ACNP Maryanna Shape PCCM Pager [phone] till 3 pm If no answer page [phone] 01/21/2015, 11:33 AM   Attending:  I have seen and examined the patient with nurse practitioner/resident and agree with the note above.   BP improved She feels weak, doesn't like our food  Lungs clear Belly soft RRR, no mgr  Hypotension from diarrhea> resolved AKI> improving  To chair Stop bicarb gtt Advance diet To step down  Roselie Awkward, MD [place] [facility] Pager: [phone] Cell: [phone] After 3pm or if no response, call [phone]

## 2015-01-22 ENCOUNTER — Inpatient Hospital Stay (HOSPITAL_COMMUNITY): Payer: PPO

## 2015-01-22 DIAGNOSIS — N189 Chronic kidney disease, unspecified: Secondary | ICD-10-CM

## 2015-01-22 DIAGNOSIS — R112 Nausea with vomiting, unspecified: Secondary | ICD-10-CM | POA: Diagnosis present

## 2015-01-22 DIAGNOSIS — J9601 Acute respiratory failure with hypoxia: Secondary | ICD-10-CM

## 2015-01-22 DIAGNOSIS — N179 Acute kidney failure, unspecified: Secondary | ICD-10-CM | POA: Diagnosis present

## 2015-01-22 DIAGNOSIS — E872 Acidosis: Secondary | ICD-10-CM

## 2015-01-22 DIAGNOSIS — R197 Diarrhea, unspecified: Secondary | ICD-10-CM | POA: Diagnosis present

## 2015-01-22 LAB — CALCIUM, IONIZED: Calcium, Ionized, Serum: 3.5 mg/dL — ABNORMAL LOW (ref 4.5–5.6)

## 2015-01-22 LAB — BASIC METABOLIC PANEL
Anion gap: 10 (ref 5–15)
BUN: 27 mg/dL — ABNORMAL HIGH (ref 6–20)
CO2: 31 mmol/L (ref 22–32)
Calcium: 7.1 mg/dL — ABNORMAL LOW (ref 8.9–10.3)
Chloride: 102 mmol/L (ref 101–111)
Creatinine, Ser: 1.86 mg/dL — ABNORMAL HIGH (ref 0.44–1.00)
GFR calc Af Amer: 28 mL/min — ABNORMAL LOW (ref >60)
GFR calc non Af Amer: 24 mL/min — ABNORMAL LOW (ref >60)
Glucose, Bld: 117 mg/dL — ABNORMAL HIGH (ref 65–99)
Potassium: 4.5 mmol/L (ref 3.5–5.1)
Sodium: 143 mmol/L (ref 135–145)

## 2015-01-22 LAB — CBC
HCT: 24.1 % — ABNORMAL LOW (ref 36.0–46.0)
Hemoglobin: 8 g/dL — ABNORMAL LOW (ref 12.0–15.0)
MCH: 32.3 pg (ref 26.0–34.0)
MCHC: 33.2 g/dL (ref 30.0–36.0)
MCV: 97.2 fL (ref 78.0–100.0)
Platelets: 78 10*3/uL — ABNORMAL LOW (ref 150–400)
RBC: 2.48 MIL/uL — ABNORMAL LOW (ref 3.87–5.11)
RDW: 15.2 % (ref 11.5–15.5)
WBC: 3.6 10*3/uL — ABNORMAL LOW (ref 4.0–10.5)

## 2015-01-22 LAB — GLUCOSE, CAPILLARY
Glucose-Capillary: 121 mg/dL — ABNORMAL HIGH (ref 65–99)
Glucose-Capillary: 125 mg/dL — ABNORMAL HIGH (ref 65–99)
Glucose-Capillary: 152 mg/dL — ABNORMAL HIGH (ref 65–99)
Glucose-Capillary: 83 mg/dL (ref 65–99)

## 2015-01-22 LAB — PHOSPHORUS: Phosphorus: 2.4 mg/dL — ABNORMAL LOW (ref 2.5–4.6)

## 2015-01-22 LAB — MAGNESIUM: Magnesium: 1 mg/dL — ABNORMAL LOW (ref 1.7–2.4)

## 2015-01-22 MED ORDER — MAGNESIUM SULFATE 4 GM/100ML IV SOLN
4.0000 g | Freq: Once | INTRAVENOUS | Status: AC
  Administered 2015-01-22: 4 g via INTRAVENOUS
  Filled 2015-01-22: qty 100

## 2015-01-22 MED ORDER — DEXTROSE 5 % IV SOLN
30.0000 mmol | Freq: Once | INTRAVENOUS | Status: AC
Start: 2015-01-22 — End: 2015-01-22
  Administered 2015-01-22: 30 mmol via INTRAVENOUS
  Filled 2015-01-22: qty 10

## 2015-01-22 MED ORDER — TRAMADOL HCL 50 MG PO TABS
50.0000 mg | ORAL_TABLET | Freq: Four times a day (QID) | ORAL | Status: DC | PRN
  Administered 2015-01-22: 50 mg via ORAL
  Filled 2015-01-22: qty 1

## 2015-01-22 MED ORDER — ONDANSETRON HCL 4 MG/2ML IJ SOLN
4.0000 mg | Freq: Four times a day (QID) | INTRAMUSCULAR | Status: DC | PRN
  Administered 2015-01-22 – 2015-01-26 (×4): 4 mg via INTRAVENOUS
  Filled 2015-01-22 (×5): qty 2

## 2015-01-22 MED ORDER — PANTOPRAZOLE SODIUM 40 MG PO TBEC
40.0000 mg | DELAYED_RELEASE_TABLET | Freq: Every day | ORAL | Status: DC
  Administered 2015-01-22 – 2015-01-27 (×6): 40 mg via ORAL
  Filled 2015-01-22 (×6): qty 1

## 2015-01-22 MED ORDER — SODIUM CHLORIDE 0.9 % IV SOLN
INTRAVENOUS | Status: DC
  Filled 2015-01-22: qty 1000

## 2015-01-22 NOTE — Progress Notes (Signed)
[facility] PROGRESS NOTE  Rebecca Hall [id number] DOB: [birth year] DOA: 01/19/2015 PCP: Mathews Argyle, MD  Assessment/Plan: #1 hypovolemic shock Likely secondary to volume depletion from nausea vomiting and diarrhea. On admission patient was placed on pressors as she was hypotensive despite 4 L of IV fluids. Patient currently off pressors. Blood pressures soft systolic now in the [id number]. Patient with some concern for volume overload and a such will place on normal saline at 50 mL per hour. Patient has been pancultured. Blood cultures pending. Pro calcitonin yesterday was less than 0.10. Lactic acid level on 01/19/2015 was 0.56. Continue empiric IV Zosyn. Continue to hold antihypertensives medications. Follow.  #2 acute on chronic kidney disease stage II (baseline creatinine 1.4) Likely secondary to a prerenal azotemia. Renal function improving with hydration. Creatinine at 1.86 from 4.78 on admission. Continue gentle hydration and monitor for volume overload. Follow.  #3 electrolyte abnormalities/hyperkalemia/hypomagnesemia/hyponatremia Hyperkalemia secondary to acute renal failure. Hyperkalemia status post Kayexalate, calcium gluconate, resolved. Hypomagnesemia and hyponatremia secondary to GI losses. Replete magnesium level and keep greater than 2. Hyponatremia secondary to hypovolemia which has resolved with hydration. Follow replete electrolytes as needed.  #4 nausea vomiting diarrhea Improved. Patient with some complaints of nausea. Patient states had a bowel movement yesterday however not sure of consistency. Stool has not been reported.  #5 acute hypoxic respiratory failure Patient with pulmonary edema right greater than left on admission chest x-ray. Repeat chest x-ray today. Continue O2 to keep sats greater than 92%. Follow.  #6 nonanion gap acidosis Secondary to nausea vomiting diarrhea. Resolved with bicarbonate drip.  #7 diabetes mellitus Check a hemoglobin A1c.  Patient does not like the food here. CBGs have ranged from 69 -121. Will liberalize diet. Continue sliding scale insulin.  #8 gastroesophageal reflux disease/IBS PPI.  #9 Hx breast cancer status post right lumpectomy Followed by Dr. Jana Hakim. Continue anastrozole.  #10 back pain Pain management.  #11 pancytopenia Likely secondary to anastrozole. Platelet count trending down. DC heparin.  #12 prophylaxis PPI for GI prophylaxis. SCDs for DVT prophylaxis.  Code Status: Full Family Communication: Updated patient. No family at bedside. Disposition Plan: Remain in the step down unit. If patient has to go back on pressors will need to go back to the ICU.   Consultants:  PCCM admit  Procedures: CXR 7/28 >>> CM with interstitial edema. CT A/P 7/28 >>> no acute process. CXR 7/29 >>> left IJ in place Left IJ 01/20/2015  Antibiotics: Vanc, start date 7/28>>>7/29 Abx: Zosyn, start date 7/28>>>  HPI/Subjective: Patient states she feels awful. Patient states she feels hot all over and patient complaining of back pain. Patient complaining of nausea. Patient denies any shortness of breath. Patient denies any chest pain.  Objective: Filed Vitals:   01/22/15 0800  BP: 103/52  Pulse: 81  Temp:   Resp: 17    Intake/Output Summary (Last 24 hours) at 01/22/15 0917 Last data filed at 01/22/15 [id number]  Gross per 24 hour  Intake   1845 ml  Output   2275 ml  Net   -430 ml   Filed Weights   01/20/15 0205 01/21/15 0407 01/22/15 0305  Weight: 84.9 kg (187 lb 2.7 oz) 80.6 kg (177 lb 11.1 oz) 80.3 kg (177 lb 0.5 oz)    Exam:   General:  NAD  Cardiovascular: RRR  Respiratory: Minimal expiratory wheezing. Some bibasilar crackles.  Abdomen: Soft, nontender, nondistended, positive bowel sounds.  Musculoskeletal: No clubbing cyanosis or edema.  Data Reviewed: Basic Metabolic Panel:  Recent  Labs Lab 01/20/15 0050 01/20/15 0450 01/20/15 1928 01/21/15 1200 01/22/15 0435  NA  127* 128* 137 145 143  K 6.0* 5.3* 4.5 4.3 4.5  CL 102 105 104 105 102  CO2 13* 13* 24 33* 31  GLUCOSE 357* 295* 119* 125* 117*  BUN 82* 72* 59* 38* 27*  CREATININE 3.88* 3.37* 2.85* 2.10* 1.86*  CALCIUM 6.4* 5.9* 6.7* 7.0* 7.1*  MG  --  1.0*  --   --  1.0*  PHOS  --  5.9*  --   --  2.4*   Liver Function Tests:  Recent Labs Lab 01/19/15 1700  AST 23  ALT 21  ALKPHOS 53  BILITOT 0.3  PROT 5.8*  ALBUMIN 3.3*   No results for input(s): LIPASE, AMYLASE in the last 168 hours. No results for input(s): AMMONIA in the last 168 hours. CBC:  Recent Labs Lab 01/19/15 1700 01/20/15 0450 01/22/15 0435  WBC 6.1 6.7 3.6*  NEUTROABS 5.1  --   --   HGB 9.3* 8.9* 8.0*  HCT 27.6* 25.5* 24.1*  MCV 93.9 93.4 97.2  PLT 94* 96* 78*   Cardiac Enzymes: No results for input(s): CKTOTAL, CKMB, CKMBINDEX, TROPONINI in the last 168 hours. BNP (last 3 results) No results for input(s): BNP in the last 8760 hours.  ProBNP (last 3 results) No results for input(s): PROBNP in the last 8760 hours.  CBG:  Recent Labs Lab 01/21/15 1726 01/21/15 1852 01/21/15 2043 01/21/15 2338 01/22/15 0340  GLUCAP 147* 114* 108* 112* 121*    Recent Results (from the past 240 hour(s))  Blood culture (routine x 2)     Status: None (Preliminary result)   Collection Time: 01/19/15  5:13 PM  Result Value Ref Range Status   Specimen Description BLOOD RIGHT HAND  Final   Special Requests BOTTLES DRAWN AEROBIC AND ANAEROBIC 5CC  Final   Culture NO GROWTH 2 DAYS  Final   Report Status PENDING  Incomplete  Blood culture (routine x 2)     Status: None (Preliminary result)   Collection Time: 01/19/15  5:25 PM  Result Value Ref Range Status   Specimen Description BLOOD RIGHT FOREARM  Final   Special Requests BOTTLES DRAWN AEROBIC AND ANAEROBIC 5ML  Final   Culture NO GROWTH 2 DAYS  Final   Report Status PENDING  Incomplete  MRSA PCR Screening     Status: None   Collection Time: 01/19/15 10:21 PM  Result  Value Ref Range Status   MRSA by PCR NEGATIVE NEGATIVE Final    Comment:        The GeneXpert MRSA Assay (FDA approved for NASAL specimens only), is one component of a comprehensive MRSA colonization surveillance program. It is not intended to diagnose MRSA infection nor to guide or monitor treatment for MRSA infections.   Urine culture     Status: None   Collection Time: 01/20/15  5:49 AM  Result Value Ref Range Status   Specimen Description URINE, CLEAN CATCH  Final   Special Requests NONE  Final   Culture NO GROWTH 1 DAY  Final   Report Status 01/21/2015 FINAL  Final     Studies: No results found.  Scheduled Meds: . anastrozole  1 mg Oral Daily  . antiseptic oral rinse  7 mL Mouth Rinse q12n4p  . chlorhexidine  15 mL Mouth Rinse BID  . heparin  5,000 Units Subcutaneous 3 times per day  . insulin aspart  0-15 Units Subcutaneous 6 times per day  .  magnesium sulfate 1 - 4 g bolus IVPB  2 g Intravenous Once  . magnesium sulfate 1 - 4 g bolus IVPB  4 g Intravenous Once  . pantoprazole  40 mg Oral Daily  . piperacillin-tazobactam (ZOSYN)  IV  2.25 g Intravenous Q8H  . potassium phosphate IVPB (mmol)  30 mmol Intravenous Once  . sodium polystyrene  30 g Oral Once   Continuous Infusions: . phenylephrine (NEO-SYNEPHRINE) Adult infusion Stopped (01/20/15 0916)  . sodium chloride 0.9 % 1,000 mL infusion      Principal Problem:   Hypovolemic shock Active Problems:   Encounter for central line placement   Respiratory failure   Acidosis, metabolic   Hyperkalemia   Acute renal failure syndrome   Nausea vomiting and diarrhea   Hypomagnesemia   Renal failure (ARF), acute on chronic: Stage 2    Time spent: 56 mins    [facility] MD [facility] Pager [phone]. If 7PM-7AM, please contact night-coverage at [email or web address], password [facility] 01/22/2015, 9:17 AM  LOS: 3 days

## 2015-01-22 NOTE — Progress Notes (Signed)
ANTIBIOTIC CONSULT NOTE - FOLLOW UP  Pharmacy Consult for Zosyn Indication: rule out sepsis  Recent Labs  01/19/15 1700  01/20/15 0450 01/20/15 1928 01/21/15 1200 01/22/15 0435  WBC 6.1  --  6.7  --   --  3.6*  HGB 9.3*  --  8.9*  --   --  8.0*  PLT 94*  --  96*  --   --  78*  CREATININE 4.78*  < > 3.37* 2.85* 2.10* 1.86*  < > = values in this interval not displayed. Estimated Creatinine Clearance: 23.3 mL/min (by C-G formula based on Cr of 1.86). No results for input(s): VANCOTROUGH, VANCOPEAK, VANCORANDOM, GENTTROUGH, GENTPEAK, GENTRANDOM, TOBRATROUGH, TOBRAPEAK, TOBRARND, AMIKACINPEAK, AMIKACINTROU, AMIKACIN in the last 72 hours.   Microbiology: Recent Results (from the past 720 hour(s))  Blood culture (routine x 2)     Status: None (Preliminary result)   Collection Time: 01/19/15  5:13 PM  Result Value Ref Range Status   Specimen Description BLOOD RIGHT HAND  Final   Special Requests BOTTLES DRAWN AEROBIC AND ANAEROBIC 5CC  Final   Culture NO GROWTH 2 DAYS  Final   Report Status PENDING  Incomplete  Blood culture (routine x 2)     Status: None (Preliminary result)   Collection Time: 01/19/15  5:25 PM  Result Value Ref Range Status   Specimen Description BLOOD RIGHT FOREARM  Final   Special Requests BOTTLES DRAWN AEROBIC AND ANAEROBIC 5ML  Final   Culture NO GROWTH 2 DAYS  Final   Report Status PENDING  Incomplete  MRSA PCR Screening     Status: None   Collection Time: 01/19/15 10:21 PM  Result Value Ref Range Status   MRSA by PCR NEGATIVE NEGATIVE Final    Comment:        The GeneXpert MRSA Assay (FDA approved for NASAL specimens only), is one component of a comprehensive MRSA colonization surveillance program. It is not intended to diagnose MRSA infection nor to guide or monitor treatment for MRSA infections.   Urine culture     Status: None   Collection Time: 01/20/15  5:49 AM  Result Value Ref Range Status   Specimen Description URINE, CLEAN CATCH  Final    Special Requests NONE  Final   Culture NO GROWTH 1 DAY  Final   Report Status 01/21/2015 FINAL  Final    Anti-infectives    Start     Dose/Rate Route Frequency Ordered Stop   01/20/15 0200  piperacillin-tazobactam (ZOSYN) IVPB 2.25 g     2.25 g 100 mL/hr over 30 Minutes Intravenous Every 8 hours 01/19/15 2203     01/19/15 1745  vancomycin (VANCOCIN) 1,250 mg in sodium chloride 0.9 % 250 mL IVPB     1,250 mg 166.7 mL/hr over 90 Minutes Intravenous  Once 01/19/15 1731 01/19/15 1920   01/19/15 1730  piperacillin-tazobactam (ZOSYN) IVPB 3.375 g     3.375 g 100 mL/hr over 30 Minutes Intravenous  Once 01/19/15 1722 01/19/15 1816      Assessment: 83 YOF who presented to the [facility] on 7/28 with hypotension and dizziness, & several days of N/V/D.   Pharmacy consulted to dose zosyn.  Infectious Disease: r/o sepsis  Afeb, wbc wnl 7/28 Vanc>>7/29 7/28 Zosyn>>   7/28 UCx>> neg, final 7/28 BCx2>> NGTD  Nephrology: CKD, baseline 1.4, Est CrCl ~  35ml/min, trend improving K 4.5 (s/p kayexalate),   Net neg 1170 ml.    Goal of Therapy:  Renal adjustment of antibiotics.  Plan:  Renal function improving, cultures remain negative, no signs and symptoms of infection. Vancomycin DCd, consider DC zosyn if clinically indicated.  For now will continue zosyn 2.25 g IV q8h, if therapy to continue and renal function continues to improve will adjust dose.  Thank you for allowing pharmacy to be a part of this patients care team.  Rowe Robert Pharm.D., BCPS, AQ-Cardiology Clinical Pharmacist 01/22/2015 7:44 AM Pager: [phone] Phone: [phone]

## 2015-01-23 DIAGNOSIS — E875 Hyperkalemia: Secondary | ICD-10-CM

## 2015-01-23 DIAGNOSIS — R232 Flushing: Secondary | ICD-10-CM | POA: Diagnosis present

## 2015-01-23 DIAGNOSIS — R579 Shock, unspecified: Secondary | ICD-10-CM | POA: Insufficient documentation

## 2015-01-23 LAB — RETICULOCYTES
RBC.: 2.65 MIL/uL — ABNORMAL LOW (ref 3.87–5.11)
Retic Count, Absolute: 23.9 10*3/uL (ref 19.0–186.0)
Retic Ct Pct: 0.9 % (ref 0.4–3.1)

## 2015-01-23 LAB — BASIC METABOLIC PANEL
Anion gap: 6 (ref 5–15)
BUN: 14 mg/dL (ref 6–20)
CO2: 28 mmol/L (ref 22–32)
Calcium: 7.8 mg/dL — ABNORMAL LOW (ref 8.9–10.3)
Chloride: 105 mmol/L (ref 101–111)
Creatinine, Ser: 1.61 mg/dL — ABNORMAL HIGH (ref 0.44–1.00)
GFR calc Af Amer: 33 mL/min — ABNORMAL LOW (ref >60)
GFR calc non Af Amer: 29 mL/min — ABNORMAL LOW (ref >60)
Glucose, Bld: 124 mg/dL — ABNORMAL HIGH (ref 65–99)
Potassium: 5.3 mmol/L — ABNORMAL HIGH (ref 3.5–5.1)
Sodium: 139 mmol/L (ref 135–145)

## 2015-01-23 LAB — CBC
HCT: 26 % — ABNORMAL LOW (ref 36.0–46.0)
Hemoglobin: 8.5 g/dL — ABNORMAL LOW (ref 12.0–15.0)
MCH: 32.1 pg (ref 26.0–34.0)
MCHC: 32.7 g/dL (ref 30.0–36.0)
MCV: 98.1 fL (ref 78.0–100.0)
Platelets: 81 10*3/uL — ABNORMAL LOW (ref 150–400)
RBC: 2.65 MIL/uL — ABNORMAL LOW (ref 3.87–5.11)
RDW: 15.1 % (ref 11.5–15.5)
WBC: 5.6 10*3/uL (ref 4.0–10.5)

## 2015-01-23 LAB — MAGNESIUM: Magnesium: 1.7 mg/dL (ref 1.7–2.4)

## 2015-01-23 LAB — GLUCOSE, CAPILLARY
Glucose-Capillary: 117 mg/dL — ABNORMAL HIGH (ref 65–99)
Glucose-Capillary: 123 mg/dL — ABNORMAL HIGH (ref 65–99)
Glucose-Capillary: 142 mg/dL — ABNORMAL HIGH (ref 65–99)
Glucose-Capillary: 155 mg/dL — ABNORMAL HIGH (ref 65–99)
Glucose-Capillary: 98 mg/dL (ref 65–99)

## 2015-01-23 LAB — FERRITIN: Ferritin: 68 ng/mL (ref 11–307)

## 2015-01-23 LAB — IRON AND TIBC
Iron: 45 ug/dL (ref 28–170)
Saturation Ratios: 17 % (ref 10.4–31.8)
TIBC: 263 ug/dL (ref 250–450)
UIBC: 218 ug/dL

## 2015-01-23 LAB — VITAMIN B12: Vitamin B-12: 745 pg/mL (ref 180–914)

## 2015-01-23 LAB — FOLATE: Folate: >80 ng/mL (ref >5.9)

## 2015-01-23 MED ORDER — FUROSEMIDE 40 MG PO TABS
40.0000 mg | ORAL_TABLET | Freq: Every day | ORAL | Status: DC
  Filled 2015-01-23: qty 1

## 2015-01-23 MED ORDER — VENLAFAXINE HCL 37.5 MG PO TABS
37.5000 mg | ORAL_TABLET | Freq: Two times a day (BID) | ORAL | Status: DC
  Administered 2015-01-23 – 2015-01-24 (×3): 37.5 mg via ORAL
  Filled 2015-01-23 (×5): qty 1

## 2015-01-23 MED ORDER — ALLOPURINOL 100 MG PO TABS
100.0000 mg | ORAL_TABLET | Freq: Every day | ORAL | Status: DC
  Filled 2015-01-23: qty 1

## 2015-01-23 MED ORDER — MAGNESIUM SULFATE 2 GM/50ML IV SOLN
2.0000 g | Freq: Once | INTRAVENOUS | Status: AC
  Administered 2015-01-23: 2 g via INTRAVENOUS
  Filled 2015-01-23: qty 50

## 2015-01-23 MED ORDER — DIAZEPAM 5 MG PO TABS: 5.0000 mg | ORAL_TABLET | Freq: Every evening | ORAL | Status: DC | PRN

## 2015-01-23 MED ORDER — FUROSEMIDE 10 MG/ML IJ SOLN
20.0000 mg | Freq: Once | INTRAMUSCULAR | Status: AC
  Administered 2015-01-23: 20 mg via INTRAVENOUS
  Filled 2015-01-23: qty 2

## 2015-01-23 MED ORDER — ZOLPIDEM TARTRATE 5 MG PO TABS: 5.0000 mg | ORAL_TABLET | Freq: Every evening | ORAL | Status: DC | PRN

## 2015-01-23 MED ORDER — DIPHENHYDRAMINE HCL 25 MG PO CAPS
25.0000 mg | ORAL_CAPSULE | Freq: Four times a day (QID) | ORAL | Status: DC | PRN
  Administered 2015-01-24: 25 mg via ORAL
  Filled 2015-01-23 (×3): qty 1

## 2015-01-23 MED ORDER — FUROSEMIDE 10 MG/ML IJ SOLN: 40.0000 mg | Freq: Once | INTRAMUSCULAR | Status: DC

## 2015-01-23 MED ORDER — INSULIN ASPART 100 UNIT/ML ~~LOC~~ SOLN
0.0000 [IU] | Freq: Three times a day (TID) | SUBCUTANEOUS | Status: DC
  Administered 2015-01-23: 3 [IU] via SUBCUTANEOUS
  Administered 2015-01-23 – 2015-01-26 (×3): 2 [IU] via SUBCUTANEOUS
  Administered 2015-01-27: 3 [IU] via SUBCUTANEOUS

## 2015-01-23 MED ORDER — ASPIRIN 325 MG PO TABS
325.0000 mg | ORAL_TABLET | Freq: Every day | ORAL | Status: DC
  Administered 2015-01-23 – 2015-01-27 (×5): 325 mg via ORAL
  Filled 2015-01-23 (×5): qty 1

## 2015-01-23 MED ORDER — LORAZEPAM 0.5 MG PO TABS: 0.2500 mg | ORAL_TABLET | Freq: Two times a day (BID) | ORAL | Status: DC | PRN

## 2015-01-23 MED ORDER — AMOXICILLIN-POT CLAVULANATE 875-125 MG PO TABS
1.0000 | ORAL_TABLET | Freq: Two times a day (BID) | ORAL | Status: DC
  Filled 2015-01-23 (×2): qty 1

## 2015-01-23 MED ORDER — AMOXICILLIN-POT CLAVULANATE 500-125 MG PO TABS
1.0000 | ORAL_TABLET | Freq: Two times a day (BID) | ORAL | Status: AC
  Administered 2015-01-23 – 2015-01-26 (×7): 500 mg via ORAL
  Filled 2015-01-23 (×10): qty 1

## 2015-01-23 MED ORDER — DIPHENHYDRAMINE-APAP (SLEEP) 25-500 MG PO TABS: 1.0000 | ORAL_TABLET | Freq: Every day | ORAL | Status: DC

## 2015-01-23 MED ORDER — ALLOPURINOL 300 MG PO TABS
400.0000 mg | ORAL_TABLET | Freq: Every day | ORAL | Status: DC
  Administered 2015-01-23 – 2015-01-27 (×5): 400 mg via ORAL
  Filled 2015-01-23 (×7): qty 1

## 2015-01-23 MED ORDER — ALLOPURINOL 300 MG PO TABS
300.0000 mg | ORAL_TABLET | Freq: Every day | ORAL | Status: DC
  Filled 2015-01-23: qty 1

## 2015-01-23 MED ORDER — ATORVASTATIN CALCIUM 20 MG PO TABS
20.0000 mg | ORAL_TABLET | Freq: Every morning | ORAL | Status: DC
  Administered 2015-01-23 – 2015-01-27 (×5): 20 mg via ORAL
  Filled 2015-01-23 (×5): qty 1

## 2015-01-23 NOTE — Care Management Important Message (Signed)
Important Message  Patient Details  Name: TANESSA MANYGOATS MRN: [id number] Date of Birth: 1932/03/20   Medicare Important Message Given:  Yes-second notification given    Nathen May 01/23/2015, 11:49 AMImportant Message  Patient Details  Name: ANTONETTE KARWACKI MRN: [id number] Date of Birth: [birth year]   Medicare Important Message Given:  Yes-second notification given    Nathen May 01/23/2015, 11:48 AM

## 2015-01-23 NOTE — Progress Notes (Signed)
[facility] PROGRESS NOTE  Rebecca Hall [id number] DOB: September 17, 1931 DOA: 01/19/2015 PCP: Mathews Argyle, MD  Assessment/Plan: #1 hypovolemic shock Likely secondary to volume depletion from nausea, vomiting and diarrhea. On admission patient was placed on pressors as she was hypotensive despite 4 L of IV fluids. Patient currently off pressors. Blood pressures soft systolic now in the [id number]. Patient with some concern for volume overload and as such IV fluids of been discontinued and patient given a dose of IV Lasix this morning. Will give another dose of IV Lasix this afternoon. Patient has been pancultured. Blood cultures pending. Pro calcitonin was less than 0.10. Lactic acid level on 01/19/2015 was 0.56. Change IV Zosyn to oral Augmentin to complete a course of antibiotic therapy. Continue to hold antihypertensives medications. Follow.  #2 acute on chronic kidney disease stage II (baseline creatinine 1.4) Likely secondary to a prerenal azotemia. Renal function improving with some diuresis. Patient had 1.3 L urine output yesterday. Creatinine at 1.61 from 1.86 from 4.78 on admission. Patient given a dose of IV Lasix this morning. Will give another dose of IV Lasix this afternoon. Depending on fluid status may need to resume home dose Lasix tomorrow or IV Lasix. Follow.  #3 electrolyte abnormalities/hyperkalemia/hypomagnesemia/hyponatremia Hyperkalemia secondary to acute renal failure. Hyperkalemia status post Kayexalate, calcium gluconate. Patient with slight hyperkalemia this morning. Patient receiving IV Lasix. Follow. Hypomagnesemia and hyponatremia secondary to GI losses. Replete magnesium level and keep greater than 2. Hyponatremia secondary to hypovolemia which has resolved with hydration. Follow replete electrolytes as needed.  #4 nausea vomiting diarrhea Improved. Patient with some complaints of nausea. Patient with soft bowel movement per nursing. No nausea or emesis. Continue  supportive care.   #5 acute hypoxic respiratory failure/acute on chronic diastolic CHF exacerbation Patient with pulmonary edema right greater than left on admission chest x-ray. Patient on 6L [place]. Chest x-ray from 01/22/2015 with vascular congestion and increasing bibasilar atelectasis or infiltrates. Patient was given a dose of Lasix 20 mg IV 1 this morning. Will give another dose of Lasix 20 mg IV this afternoon. Check a 2-D echo. Reassess fluid status in the morning. Strict I's and O's. Daily weights. Continue O2 to keep sats greater than 92%. Follow.  #6 nonanion gap acidosis Secondary to nausea vomiting diarrhea. Resolved with bicarbonate drip.  #7 diabetes mellitus Check a hemoglobin A1c. Patient does not like the food here. CBGs have ranged from 83 -123. Will liberalized diet. Continue sliding scale insulin.  #8 gastroesophageal reflux disease/IBS PPI.  #9 Hx breast cancer status post right lumpectomy Followed by Dr. Jana Hakim. Continue anastrozole.  #10 back pain Pain management.  #11 pancytopenia Likely secondary to anastrozole. Platelet count trending back up. Off heparin.  #12 hot flashes Will try patient on Effexor.  #13 prophylaxis PPI for GI prophylaxis. SCDs for DVT prophylaxis.  Code Status: Full Family Communication: Updated patient. No family at bedside. Disposition Plan: Remain in the step down unit.    Consultants:  PCCM admit  Procedures: CXR 7/28 >>> CM with interstitial edema. CT A/P 7/28 >>> no acute process. CXR 7/29 >>> left IJ in place Left IJ 01/20/2015  Antibiotics: Vanc, start date 7/28>>>7/29 Abx: Zosyn, start date 7/28>>>01/23/15 augmentin 01/23/15  HPI/Subjective: Patient states she feels awful. Patient states she feels hot all over, feels likely hot flashes. Patient denies any shortness of breath. Patient denies any chest pain.  Objective: Filed Vitals:   01/23/15 0731  BP: 138/63  Pulse: 96  Temp: 98.4 F (36.9 C)  Resp: 26     Intake/Output Summary (Last 24 hours) at 01/23/15 0925 Last data filed at 01/23/15 0920  Gross per 24 hour  Intake   1050 ml  Output   1950 ml  Net   -900 ml   Filed Weights   01/21/15 0407 01/22/15 0305 01/23/15 0403  Weight: 80.6 kg (177 lb 11.1 oz) 80.3 kg (177 lb 0.5 oz) 79.5 kg (175 lb 4.3 oz)    Exam:   General:  NAD  Cardiovascular: RRR  Respiratory: Minimal expiratory wheezing. Some bibasilar crackles.  Abdomen: Soft, nontender, nondistended, positive bowel sounds.  Musculoskeletal: No clubbing cyanosis or edema.  Data Reviewed: Basic Metabolic Panel:  Recent Labs Lab 01/20/15 0450 01/20/15 1928 01/21/15 1200 01/22/15 0435 01/23/15 0342  NA 128* 137 145 143 139  K 5.3* 4.5 4.3 4.5 5.3*  CL 105 104 105 102 105  CO2 13* 24 33* 31 28  GLUCOSE 295* 119* 125* 117* 124*  BUN 72* 59* 38* 27* 14  CREATININE 3.37* 2.85* 2.10* 1.86* 1.61*  CALCIUM 5.9* 6.7* 7.0* 7.1* 7.8*  MG 1.0*  --   --  1.0* 1.7  PHOS 5.9*  --   --  2.4*  --    Liver Function Tests:  Recent Labs Lab 01/19/15 1700  AST 23  ALT 21  ALKPHOS 53  BILITOT 0.3  PROT 5.8*  ALBUMIN 3.3*   No results for input(s): LIPASE, AMYLASE in the last 168 hours. No results for input(s): AMMONIA in the last 168 hours. CBC:  Recent Labs Lab 01/19/15 1700 01/20/15 0450 01/22/15 0435 01/23/15 0342  WBC 6.1 6.7 3.6* 5.6  NEUTROABS 5.1  --   --   --   HGB 9.3* 8.9* 8.0* 8.5*  HCT 27.6* 25.5* 24.1* 26.0*  MCV 93.9 93.4 97.2 98.1  PLT 94* 96* 78* 81*   Cardiac Enzymes: No results for input(s): CKTOTAL, CKMB, CKMBINDEX, TROPONINI in the last 168 hours. BNP (last 3 results) No results for input(s): BNP in the last 8760 hours.  ProBNP (last 3 results) No results for input(s): PROBNP in the last 8760 hours.  CBG:  Recent Labs Lab 01/22/15 1628 01/22/15 1958 01/22/15 2335 01/23/15 0401 01/23/15 0733  GLUCAP 125* 152* 83 117* 123*    Recent Results (from the past 240 hour(s))   Blood culture (routine x 2)     Status: None (Preliminary result)   Collection Time: 01/19/15  5:13 PM  Result Value Ref Range Status   Specimen Description BLOOD RIGHT HAND  Final   Special Requests BOTTLES DRAWN AEROBIC AND ANAEROBIC 5CC  Final   Culture NO GROWTH 3 DAYS  Final   Report Status PENDING  Incomplete  Blood culture (routine x 2)     Status: None (Preliminary result)   Collection Time: 01/19/15  5:25 PM  Result Value Ref Range Status   Specimen Description BLOOD RIGHT FOREARM  Final   Special Requests BOTTLES DRAWN AEROBIC AND ANAEROBIC 5ML  Final   Culture NO GROWTH 3 DAYS  Final   Report Status PENDING  Incomplete  MRSA PCR Screening     Status: None   Collection Time: 01/19/15 10:21 PM  Result Value Ref Range Status   MRSA by PCR NEGATIVE NEGATIVE Final    Comment:        The GeneXpert MRSA Assay (FDA approved for NASAL specimens only), is one component of a comprehensive MRSA colonization surveillance program. It is not intended to diagnose MRSA infection nor to guide  or monitor treatment for MRSA infections.   Urine culture     Status: None   Collection Time: 01/20/15  5:49 AM  Result Value Ref Range Status   Specimen Description URINE, CLEAN CATCH  Final   Special Requests NONE  Final   Culture NO GROWTH 1 DAY  Final   Report Status 01/21/2015 FINAL  Final     Studies: Dg Chest Port 1 View  01/22/2015   CLINICAL DATA:  hypotension.  Nausea, vomiting, diarrhea.  EXAM: PORTABLE CHEST - 1 VIEW  COMPARISON:  01/20/2015  FINDINGS: Left central line remains in place, unchanged. There is mild cardiomegaly and vascular congestion. Increasing bibasilar atelectasis or infiltrates. No visible effusions or acute bony abnormality.  IMPRESSION: Mild cardiomegaly and vascular congestion.  Increasing bibasilar atelectasis or infiltrates.   Electronically Signed   By: Rolm Baptise M.D.   On: 01/22/2015 12:43    Scheduled Meds: . anastrozole  1 mg Oral Daily  .  antiseptic oral rinse  7 mL Mouth Rinse q12n4p  . chlorhexidine  15 mL Mouth Rinse BID  . furosemide  20 mg Intravenous Once  . insulin aspart  0-15 Units Subcutaneous TID WC & HS  . magnesium sulfate 1 - 4 g bolus IVPB  2 g Intravenous Once  . magnesium sulfate 1 - 4 g bolus IVPB  2 g Intravenous Once  . pantoprazole  40 mg Oral Daily  . piperacillin-tazobactam (ZOSYN)  IV  2.25 g Intravenous Q8H  . sodium polystyrene  30 g Oral Once  . venlafaxine  37.5 mg Oral BID WC   Continuous Infusions: . phenylephrine (NEO-SYNEPHRINE) Adult infusion Stopped (01/20/15 0916)    Principal Problem:   Hypovolemic shock Active Problems:   Encounter for central line placement   Respiratory failure   Acidosis, metabolic   Hyperkalemia   Acute renal failure syndrome   Nausea vomiting and diarrhea   Hypomagnesemia   Renal failure (ARF), acute on chronic: Stage 2   Hot flashes    Time spent: 40 mins    [facility] MD [facility] Pager [phone]. If 7PM-7AM, please contact night-coverage at [email or web address], password [facility] 01/23/2015, 9:25 AM  LOS: 4 days

## 2015-01-23 NOTE — Care Management Important Message (Signed)
Important Message  Patient Details  Name: Rebecca Hall MRN: [id number] Date of Birth: 10/28/1931   Medicare Important Message Given:  Yes-second notification given    Pricilla Handler 01/23/2015, 12:16 PM

## 2015-01-24 ENCOUNTER — Inpatient Hospital Stay (HOSPITAL_COMMUNITY): Payer: PPO

## 2015-01-24 DIAGNOSIS — I5033 Acute on chronic diastolic (congestive) heart failure: Secondary | ICD-10-CM

## 2015-01-24 DIAGNOSIS — N951 Menopausal and female climacteric states: Secondary | ICD-10-CM

## 2015-01-24 DIAGNOSIS — R0600 Dyspnea, unspecified: Secondary | ICD-10-CM

## 2015-01-24 LAB — GLUCOSE, CAPILLARY
Glucose-Capillary: 97 mg/dL (ref 65–99)
Glucose-Capillary: 106 mg/dL — ABNORMAL HIGH (ref 65–99)
Glucose-Capillary: 120 mg/dL — ABNORMAL HIGH (ref 65–99)

## 2015-01-24 LAB — CULTURE, BLOOD (ROUTINE X 2)
Culture: NO GROWTH
Culture: NO GROWTH

## 2015-01-24 LAB — BASIC METABOLIC PANEL
Anion gap: 9 (ref 5–15)
BUN: 14 mg/dL (ref 6–20)
CO2: 28 mmol/L (ref 22–32)
Calcium: 8.2 mg/dL — ABNORMAL LOW (ref 8.9–10.3)
Chloride: 100 mmol/L — ABNORMAL LOW (ref 101–111)
Creatinine, Ser: 1.67 mg/dL — ABNORMAL HIGH (ref 0.44–1.00)
GFR calc Af Amer: 32 mL/min — ABNORMAL LOW (ref >60)
GFR calc non Af Amer: 28 mL/min — ABNORMAL LOW (ref >60)
Glucose, Bld: 104 mg/dL — ABNORMAL HIGH (ref 65–99)
Potassium: 5 mmol/L (ref 3.5–5.1)
Sodium: 137 mmol/L (ref 135–145)

## 2015-01-24 LAB — CBC
HCT: 23.8 % — ABNORMAL LOW (ref 36.0–46.0)
Hemoglobin: 7.6 g/dL — ABNORMAL LOW (ref 12.0–15.0)
MCH: 31.1 pg (ref 26.0–34.0)
MCHC: 31.9 g/dL (ref 30.0–36.0)
MCV: 97.5 fL (ref 78.0–100.0)
Platelets: 83 10*3/uL — ABNORMAL LOW (ref 150–400)
RBC: 2.44 MIL/uL — ABNORMAL LOW (ref 3.87–5.11)
RDW: 14.9 % (ref 11.5–15.5)
WBC: 4.1 10*3/uL (ref 4.0–10.5)

## 2015-01-24 LAB — HEMOGLOBIN A1C
Hgb A1c MFr Bld: 7.8 % — ABNORMAL HIGH (ref 4.8–5.6)
Mean Plasma Glucose: 177 mg/dL

## 2015-01-24 LAB — MAGNESIUM: Magnesium: 1.7 mg/dL (ref 1.7–2.4)

## 2015-01-24 LAB — PHOSPHORUS: Phosphorus: 3.4 mg/dL (ref 2.5–4.6)

## 2015-01-24 MED ORDER — VENLAFAXINE HCL 50 MG PO TABS
50.0000 mg | ORAL_TABLET | Freq: Two times a day (BID) | ORAL | Status: DC
  Administered 2015-01-24: 50 mg via ORAL
  Filled 2015-01-24 (×9): qty 1

## 2015-01-24 MED ORDER — FUROSEMIDE 40 MG PO TABS
40.0000 mg | ORAL_TABLET | Freq: Every day | ORAL | Status: DC
  Administered 2015-01-24 – 2015-01-25 (×2): 40 mg via ORAL
  Filled 2015-01-24 (×2): qty 1

## 2015-01-24 MED ORDER — MAGNESIUM SULFATE 50 % IJ SOLN
3.0000 g | Freq: Once | INTRAVENOUS | Status: AC
  Administered 2015-01-24: 3 g via INTRAVENOUS
  Filled 2015-01-24: qty 6

## 2015-01-24 NOTE — Progress Notes (Signed)
Pt transferred to the unit. Pt is stable, alert and oriented per baseline. Oriented to room, staff, and call bell. Educated to call for any assistance. Bed in lowest position, call bell within reach- will continue to monitor. 

## 2015-01-24 NOTE — Progress Notes (Signed)
[facility] PROGRESS NOTE  Rebecca Hall [id number] DOB: [birth year] DOA: 01/19/2015 PCP: Mathews Argyle, MD  Assessment/Plan: #1 hypovolemic shock Likely secondary to volume depletion from nausea, vomiting and diarrhea. On admission patient was placed on pressors as she was hypotensive despite 4 L of IV fluids. Patient currently off pressors. Blood pressures have improved. Patient diuresed well with IV Lasix due to concern for volume overload. Will transition from IV Lasix to home dose oral Lasix. Patient has been pancultured. Blood cultures pending. Pro calcitonin was less than 0.10. Lactic acid level on 01/19/2015 was 0.56. Continue oral Augmentin to complete a course of antibiotic therapy. Continue to hold antihypertensives medications. Follow.  #2 acute on chronic kidney disease stage II (baseline creatinine 1.4) Likely secondary to a prerenal azotemia. Renal function improving with some diuresis. Patient had 1.58 L urine output yesterday. Creatinine at 1.67 from 1.61 from 1.86 from 4.78 on admission. Will transition from IV Lasix to home dose oral Lasix. Follow.   #3 electrolyte abnormalities/hyperkalemia/hypomagnesemia/hyponatremia Hyperkalemia secondary to acute renal failure. Hyperkalemia status post Kayexalate, calcium gluconate. Patient with slight hyperkalemia this morning. Patient receiving IV Lasix. Follow. Hypomagnesemia and hyponatremia secondary to GI losses. Replete magnesium level and keep greater than 2. Hyponatremia secondary to hypovolemia which has resolved with hydration. Follow replete electrolytes as needed.  #4 nausea vomiting diarrhea Improved. Patient with some complaints of nausea. Patient with soft bowel movement per nursing. No nausea or emesis. Continue supportive care.   #5 acute hypoxic respiratory failure/acute on chronic diastolic CHF exacerbation Patient with pulmonary edema right greater than left on admission chest x-ray. Patient on 5L [place].  Chest x-ray from 01/22/2015 with vascular congestion and increasing bibasilar atelectasis or infiltrates. Patient given a few doses of IV Lasix and overnight urine output was 1.58 L. Patient is -750 over the past 24 hours. Will place patient back on her home dose oral Lasix. 2-D echo pending. Strict I's and O's. Daily weights. Continue O2 to keep sats greater than 92%. Follow.  #6 nonanion gap acidosis Secondary to nausea vomiting diarrhea. Resolved with bicarbonate drip.  #7 diabetes mellitus Hemoglobin A1c = 7.8. Patient does not like the food here. CBGs have ranged from 97 -155. Continue current regular diet. Continue sliding scale insulin.  #8 gastroesophageal reflux disease/IBS PPI.  #9 Hx breast cancer status post right lumpectomy Followed by Dr. Jana Hakim. Continue anastrozole.  #10 back pain Pain management.  #11 pancytopenia Likely secondary to anastrozole. Platelet count trending back up. Off heparin.  #12 hot flashes Likely secondary to anastrozole. Patient states no significant change with hot flashes. Will increase the dose and monitor.  #13 prophylaxis PPI for GI prophylaxis. SCDs for DVT prophylaxis.  Code Status: Full Family Communication: Updated patient. No family at bedside. Disposition Plan: Transfer to  SDU.   Consultants:  PCCM admit  Procedures: CXR 7/28 >>> CM with interstitial edema. CT A/P 7/28 >>> no acute process. CXR 7/29 >>> left IJ in place Left IJ 01/20/2015  Antibiotics: Vanc, start date 7/28>>>7/29 Abx: Zosyn, start date 7/28>>>01/23/15 augmentin 01/23/15  HPI/Subjective: Patient c/o hot flashes. No SOB. No CP.  Objective: Filed Vitals:   01/24/15 0749  BP: 127/64  Pulse: 91  Temp: 98.5 F (36.9 C)  Resp: 28    Intake/Output Summary (Last 24 hours) at 01/24/15 1019 Last data filed at 01/24/15 0600  Gross per 24 hour  Intake    610 ml  Output    750 ml  Net   -140 ml  Filed Weights   01/22/15 0305 01/23/15 0403 01/24/15  0500  Weight: 80.3 kg (177 lb 0.5 oz) 79.5 kg (175 lb 4.3 oz) 77.1 kg (169 lb 15.6 oz)    Exam:   General:  NAD  Cardiovascular: RRR  Respiratory: CTAB  Abdomen: Soft, nontender, nondistended, positive bowel sounds.  Musculoskeletal: No clubbing cyanosis or edema.  Data Reviewed: Basic Metabolic Panel:  Recent Labs Lab 01/20/15 0450 01/20/15 1928 01/21/15 1200 01/22/15 0435 01/23/15 0342 01/24/15 0450  NA 128* 137 145 143 139 137  K 5.3* 4.5 4.3 4.5 5.3* 5.0  CL 105 104 105 102 105 100*  CO2 13* 24 33* 31 28 28   GLUCOSE 295* 119* 125* 117* 124* 104*  BUN 72* 59* 38* 27* 14 14  CREATININE 3.37* 2.85* 2.10* 1.86* 1.61* 1.67*  CALCIUM 5.9* 6.7* 7.0* 7.1* 7.8* 8.2*  MG 1.0*  --   --  1.0* 1.7 1.7  PHOS 5.9*  --   --  2.4*  --  3.4   Liver Function Tests:  Recent Labs Lab 01/19/15 1700  AST 23  ALT 21  ALKPHOS 53  BILITOT 0.3  PROT 5.8*  ALBUMIN 3.3*   No results for input(s): LIPASE, AMYLASE in the last 168 hours. No results for input(s): AMMONIA in the last 168 hours. CBC:  Recent Labs Lab 01/19/15 1700 01/20/15 0450 01/22/15 0435 01/23/15 0342 01/24/15 0450  WBC 6.1 6.7 3.6* 5.6 4.1  NEUTROABS 5.1  --   --   --   --   HGB 9.3* 8.9* 8.0* 8.5* 7.6*  HCT 27.6* 25.5* 24.1* 26.0* 23.8*  MCV 93.9 93.4 97.2 98.1 97.5  PLT 94* 96* 78* 81* 83*   Cardiac Enzymes: No results for input(s): CKTOTAL, CKMB, CKMBINDEX, TROPONINI in the last 168 hours. BNP (last 3 results) No results for input(s): BNP in the last 8760 hours.  ProBNP (last 3 results) No results for input(s): PROBNP in the last 8760 hours.  CBG:  Recent Labs Lab 01/23/15 0733 01/23/15 1209 01/23/15 1656 01/23/15 2111 01/24/15 0751  GLUCAP 123* 142* 155* 98 97    Recent Results (from the past 240 hour(s))  Blood culture (routine x 2)     Status: None (Preliminary result)   Collection Time: 01/19/15  5:13 PM  Result Value Ref Range Status   Specimen Description BLOOD RIGHT HAND   Final   Special Requests BOTTLES DRAWN AEROBIC AND ANAEROBIC 5CC  Final   Culture NO GROWTH 4 DAYS  Final   Report Status PENDING  Incomplete  Blood culture (routine x 2)     Status: None (Preliminary result)   Collection Time: 01/19/15  5:25 PM  Result Value Ref Range Status   Specimen Description BLOOD RIGHT FOREARM  Final   Special Requests BOTTLES DRAWN AEROBIC AND ANAEROBIC 5ML  Final   Culture NO GROWTH 4 DAYS  Final   Report Status PENDING  Incomplete  MRSA PCR Screening     Status: None   Collection Time: 01/19/15 10:21 PM  Result Value Ref Range Status   MRSA by PCR NEGATIVE NEGATIVE Final    Comment:        The GeneXpert MRSA Assay (FDA approved for NASAL specimens only), is one component of a comprehensive MRSA colonization surveillance program. It is not intended to diagnose MRSA infection nor to guide or monitor treatment for MRSA infections.   Urine culture     Status: None   Collection Time: 01/20/15  5:49 AM  Result Value  Ref Range Status   Specimen Description URINE, CLEAN CATCH  Final   Special Requests NONE  Final   Culture NO GROWTH 1 DAY  Final   Report Status 01/21/2015 FINAL  Final     Studies: Dg Chest Port 1 View  01/22/2015   CLINICAL DATA:  hypotension.  Nausea, vomiting, diarrhea.  EXAM: PORTABLE CHEST - 1 VIEW  COMPARISON:  01/20/2015  FINDINGS: Left central line remains in place, unchanged. There is mild cardiomegaly and vascular congestion. Increasing bibasilar atelectasis or infiltrates. No visible effusions or acute bony abnormality.  IMPRESSION: Mild cardiomegaly and vascular congestion.  Increasing bibasilar atelectasis or infiltrates.   Electronically Signed   By: Rolm Baptise M.D.   On: 01/22/2015 12:43    Scheduled Meds: . allopurinol  400 mg Oral Daily  . amoxicillin-clavulanate  1 tablet Oral BID  . anastrozole  1 mg Oral Daily  . antiseptic oral rinse  7 mL Mouth Rinse q12n4p  . aspirin  325 mg Oral Daily  . atorvastatin  20 mg  Oral q morning - 10a  . chlorhexidine  15 mL Mouth Rinse BID  . furosemide  40 mg Oral Daily  . insulin aspart  0-15 Units Subcutaneous TID WC & HS  . magnesium sulfate 1 - 4 g bolus IVPB  2 g Intravenous Once  . pantoprazole  40 mg Oral Daily  . sodium polystyrene  30 g Oral Once  . venlafaxine  37.5 mg Oral BID WC   Continuous Infusions:    Principal Problem:   Hypovolemic shock Active Problems:   Encounter for central line placement   Respiratory failure   Acidosis, metabolic   Hyperkalemia   Acute renal failure syndrome   Nausea vomiting and diarrhea   Hypomagnesemia   Renal failure (ARF), acute on chronic: Stage 2   Hot flashes   Shock    Time spent: 25 mins    [facility] MD [facility] Pager [phone]. If 7PM-7AM, please contact night-coverage at [email or web address], password [facility] 01/24/2015, 10:19 AM  LOS: 5 days

## 2015-01-24 NOTE — Evaluation (Signed)
Occupational Therapy Evaluation Patient Details Name: Rebecca Hall MRN: [id number] DOB: 11-18-1931 Today's Date: 01/24/2015    History of Present Illness 81 y.o. F brought to [facility] ED with hypotension, N/V/D. In ED, remained hypotensive despite 4L IVF; therefore, started on neosynephrine peripherally.   Clinical Impression   Pt was independent in mobility and ADL prior to admission.  She presents with generalized weakness, impaired standing balance, and decreased activity tolerance interfering with her ability to care for herself and mobilize.  Pt is eager to improve and return home with her supportive husband. Pt with sats 93% on 3L 02 for limited OOB activity. Encouraged incentive spirometer and OOB or EOB for meals.  Will follow acutely.      Follow Up Recommendations  Home health OT    Equipment Recommendations  None recommended by OT    Recommendations for Other Services       Precautions / Restrictions Precautions Precautions: Fall Precaution Comments: watch sats Restrictions Weight Bearing Restrictions: No      Mobility Bed Mobility   Bed Mobility: Supine to Sit;Sit to Supine     Supine to sit: Min guard Sit to supine: Min guard   General bed mobility comments: HOB up and use of rail  Transfers Overall transfer level: Needs assistance Equipment used: Rolling walker (2 wheeled) Transfers: Sit to/from [facility] Sit to Stand: Mod assist Stand pivot transfers: Min assist       General transfer comment: mod assist to shift weight anterior initially    Balance     Sitting balance-Leahy Scale: Good       Standing balance-Leahy Scale: Poor                              ADL Overall ADL's : Needs assistance/impaired Eating/Feeding: Independent;Sitting   Grooming: Wash/dry hands;Set up;Sitting   Upper Body Bathing: Set up;Sitting   Lower Body Bathing: Moderate assistance;Sit to/from stand   Upper Body Dressing : Set  up;Sitting   Lower Body Dressing: Moderate assistance;Sit to/from stand Lower Body Dressing Details (indicate cue type and reason): brings foot to hands to donn socks Toilet Transfer: Minimal assistance;Stand-pivot;BSC;RW   Toileting- Clothing Manipulation and Hygiene: Minimal assistance;Sit to/from stand         General ADL Comments: Began instructing in energy conservation strategies and breathing techniques.     Vision     Perception     Praxis      Pertinent Vitals/Pain Pain Assessment: No/denies pain     Hand Dominance Right   Extremity/Trunk Assessment Upper Extremity Assessment Upper Extremity Assessment: Overall WFL for tasks assessed   Lower Extremity Assessment Lower Extremity Assessment: Defer to PT evaluation   Cervical / Trunk Assessment Cervical / Trunk Assessment: Normal   Communication Communication Communication: No difficulties   Cognition Arousal/Alertness: Awake/alert Behavior During Therapy: WFL for tasks assessed/performed Overall Cognitive Status: Within Functional Limits for tasks assessed                     General Comments       Exercises       Shoulder Instructions      Home Living Family/patient expects to be discharged to:: Private residence Living Arrangements: Spouse/significant other Available Help at Discharge: Family;Available 24 hours/day Type of Home: House Home Access: Stairs to enter [employer] of Steps: 2 Entrance Stairs-Rails: None Home Layout: One level     Bathroom Shower/Tub: Walk-in shower  Bathroom Toilet: Standard Bathroom Accessibility: Yes How Accessible: Accessible via walker Home Equipment: [place] - 2 wheels;Bedside commode;Shower seat;Hand held Advertising copywriter;Toilet riser;Other (comment) (walker bag)          Prior Functioning/Environment Level of Independence: Needs assistance    ADL's / Homemaking Assistance Needed: husband helped with heavy housework  and some cooking        OT Diagnosis: Generalized weakness   OT Problem List: Decreased strength;Decreased activity tolerance;Impaired balance (sitting and/or standing);Decreased knowledge of use of DME or AE;Cardiopulmonary status limiting activity;Obesity   OT Treatment/Interventions: Self-care/ADL training;Energy conservation;DME and/or AE instruction;Patient/family education;Balance training    OT Goals(Current goals can be found in the care plan section) Acute Rehab OT Goals Patient Stated Goal: to go home with husband OT Goal Formulation: With patient Time For Goal Achievement: 02/07/15 Potential to Achieve Goals: Good ADL Goals Pt Will Perform Grooming: with supervision;standing (3 activities) Pt Will Perform Lower Body Bathing: with supervision;sit to/from stand Pt Will Perform Lower Body Dressing: with supervision;sit to/from stand Pt Will Transfer to Toilet: with supervision;ambulating Pt Will Perform Toileting - Clothing Manipulation and hygiene: with supervision;sit to/from stand Pt Will Perform Tub/Shower Transfer: Shower transfer;with supervision;ambulating;shower seat;rolling walker Additional ADL Goal #1: Pt will generalize energy conservation strategies in ADL with minimal verbal cues.  OT Frequency: Min 2X/week   Barriers to D/C:            Co-evaluation              End of Session Equipment Utilized During Treatment: Rolling walker;Oxygen (3L)  Activity Tolerance: Patient tolerated treatment well Patient left: in bed;with call bell/phone within reach;with family/visitor present   Time: [id number] OT Time Calculation (min): 14 min Charges:  OT General Charges $OT Visit: 1 Procedure OT Evaluation $Initial OT Evaluation Tier I: 1 Procedure G-Codes:    Malka So 01/24/2015, 4:06 PM  [phone]

## 2015-01-24 NOTE — Evaluation (Signed)
Physical Therapy Evaluation Patient Details Name: Rebecca Hall MRN: [id number] DOB: [birth year] Today's Date: 01/24/2015   History of Present Illness  81 y.o. F brought to [facility] ED with hypotension, N/V/D. In ED, remained hypotensive despite 4L IVF; therefore, started on neosynephrine peripherally.  Clinical Impression  Pt admitted with above diagnosis. Pt currently with functional limitations due to the deficits listed below (see PT Problem List). Pt able to ambulate with some LOB posteriorly as well as desat on 3LO2 needing 4LO2 to keep sats >90%.  Pt states husband can assist at home and has equipment.  May need home O2.  Continue PT acutely.   Pt will benefit from skilled PT to increase their independence and safety with mobility to allow discharge to the venue listed below.      Follow Up Recommendations Home health PT;Supervision/Assistance - 24 hour    Equipment Recommendations  None recommended by PT    Recommendations for Other Services       Precautions / Restrictions Precautions Precautions: Fall Restrictions Weight Bearing Restrictions: No      Mobility  Bed Mobility Overal bed mobility: Needs Assistance Bed Mobility: Supine to Sit     Supine to sit: Min guard     General bed mobility comments: incr time needed but able to get to EOB with rail.    Transfers Overall transfer level: Needs assistance Equipment used: Rolling walker (2 wheeled) Transfers: Sit to/from Stand Sit to Stand: Mod assist         General transfer comment: Pt needed mod assist to steady as she had posterior lean upon standing needing steadying assist.    Ambulation/Gait Ambulation/Gait assistance: Min assist;Mod assist Ambulation Distance (Feet): 25 Feet Assistive device: Rolling walker (2 wheeled) Gait Pattern/deviations: Step-through pattern;Decreased stride length;Leaning posteriorly;Wide base of support   Gait velocity interpretation: Below normal speed for age/gender General  Gait Details: Pt needed assist and cues for safety with use of RW due to posterior lean.  Pt at times unsteady even with RW.  Pt aware that husband will need to assist at home at current level.   Stairs            Wheelchair Mobility    Modified Rankin (Stroke Patients Only)       Balance Overall balance assessment: Needs assistance;History of Falls Sitting-balance support: No upper extremity supported;Feet supported Sitting balance-Leahy Scale: Good     Standing balance support: Bilateral upper extremity supported;During functional activity Standing balance-Leahy Scale: Poor Standing balance comment: posterior lean affects static standing needing mod assist to steady pt with standing.                               Pertinent Vitals/Pain Pain Assessment: No/denies pain   SATURATION QUALIFICATIONS: (This note is used to comply with regulatory documentation for home oxygen)  Patient Saturations on Room Air at Rest = 83-86%  Patient Saturations on Room Air while Ambulating = 80-84%  Patient Saturations on 3 Liters of oxygen while Ambulating = 87-89%  Patient Saturations on 4 Liters of oxygen while ambulating =90-93%  Please briefly explain why patient needs home oxygen: Pt desats without O2 and needed 3LO2 to keep sats >90% at rest. Needed 4LO2 with ambulation to keep sats >90%. Home Living Family/patient expects to be discharged to:: Private residence Living Arrangements: Spouse/significant other Available Help at Discharge: Family;Available 24 hours/day Type of Home: House Home Access: Stairs to enter Entrance Stairs-Rails: None  Entrance Stairs-Number of Steps: 2 Home Layout: One level Home Equipment: [place] - 2 wheels;Bedside commode;Shower seat;Hand held Advertising copywriter      Prior Function Level of Independence: Independent               Hand Dominance        Extremity/Trunk Assessment   Upper Extremity Assessment: Defer to  OT evaluation           Lower Extremity Assessment: Generalized weakness      Cervical / Trunk Assessment: Normal  Communication   Communication: No difficulties  Cognition Arousal/Alertness: Awake/alert Behavior During Therapy: WFL for tasks assessed/performed Overall Cognitive Status: Within Functional Limits for tasks assessed                      General Comments General comments (skin integrity, edema, etc.):    Exercises General Exercises - Lower Extremity Ankle Circles/Pumps: AROM;Both;5 reps;Seated Long Arc Quad: AROM;Both;10 reps;Seated Hip Flexion/Marching: AROM;Both;10 reps;Seated      Assessment/Plan    PT Assessment Patient needs continued PT services  PT Diagnosis Difficulty walking;Generalized weakness   PT Problem List Decreased activity tolerance;Decreased balance;Decreased mobility;Decreased knowledge of use of DME;Decreased safety awareness;Decreased knowledge of precautions;Decreased strength  PT Treatment Interventions DME instruction;Gait training;Functional mobility training;Therapeutic activities;Therapeutic exercise;Balance training;Patient/family education;Stair training   PT Goals (Current goals can be found in the Care Plan section) Acute Rehab PT Goals Patient Stated Goal: to get better PT Goal Formulation: With patient Time For Goal Achievement: 02/07/15 Potential to Achieve Goals: Good    Frequency Min 3X/week   Barriers to discharge        Co-evaluation               End of Session Equipment Utilized During Treatment: Gait belt;Oxygen Activity Tolerance: Patient limited by fatigue Patient left: in chair;with call bell/phone within reach;with family/visitor present Nurse Communication: Mobility status         Time: [id number] PT Time Calculation (min) (ACUTE ONLY): 20 min   Charges:   PT Evaluation $Initial PT Evaluation Tier I: 1 Procedure     PT G CodesDenice Paradise February 03, 2015, 11:32 AM Amanda [facility] [phone] (pager)

## 2015-01-24 NOTE — Progress Notes (Signed)
*  PRELIMINARY RESULTS* Echocardiogram 2D Echocardiogram has been performed.  Rebecca Hall 01/24/2015, 12:38 PM

## 2015-01-24 NOTE — Progress Notes (Signed)
SATURATION QUALIFICATIONS: (This note is used to comply with regulatory documentation for home oxygen)  Patient Saturations on Room Air at Rest = 83-86%  Patient Saturations on Room Air while Ambulating = 80-84%  Patient Saturations on 3 Liters of oxygen while Ambulating = 87-89%  Patient Saturations on 4 Liters of oxygen while ambulating =90-93%  Please briefly explain why patient needs home oxygen:  Pt desats without O2 and needed 3LO2 to keep sats >90% at rest.  Needed 4LO2 with ambulation to keep sats >90%.  May need home O2.  Thanks. [place] [phone] (pager)

## 2015-01-25 ENCOUNTER — Inpatient Hospital Stay (HOSPITAL_COMMUNITY): Payer: PPO

## 2015-01-25 ENCOUNTER — Other Ambulatory Visit (HOSPITAL_COMMUNITY): Payer: PPO

## 2015-01-25 DIAGNOSIS — R0683 Snoring: Secondary | ICD-10-CM

## 2015-01-25 LAB — CBC
HCT: 26.1 % — ABNORMAL LOW (ref 36.0–46.0)
Hemoglobin: 8.3 g/dL — ABNORMAL LOW (ref 12.0–15.0)
MCH: 31.8 pg (ref 26.0–34.0)
MCHC: 31.8 g/dL (ref 30.0–36.0)
MCV: 100 fL (ref 78.0–100.0)
Platelets: 94 10*3/uL — ABNORMAL LOW (ref 150–400)
RBC: 2.61 MIL/uL — ABNORMAL LOW (ref 3.87–5.11)
RDW: 14.8 % (ref 11.5–15.5)
WBC: 4.8 10*3/uL (ref 4.0–10.5)

## 2015-01-25 LAB — BASIC METABOLIC PANEL
Anion gap: 13 (ref 5–15)
BUN: 14 mg/dL (ref 6–20)
CO2: 26 mmol/L (ref 22–32)
Calcium: 8.8 mg/dL — ABNORMAL LOW (ref 8.9–10.3)
Chloride: 98 mmol/L — ABNORMAL LOW (ref 101–111)
Creatinine, Ser: 1.51 mg/dL — ABNORMAL HIGH (ref 0.44–1.00)
GFR calc Af Amer: 36 mL/min — ABNORMAL LOW (ref >60)
GFR calc non Af Amer: 31 mL/min — ABNORMAL LOW (ref >60)
Glucose, Bld: 163 mg/dL — ABNORMAL HIGH (ref 65–99)
Potassium: 4.5 mmol/L (ref 3.5–5.1)
Sodium: 137 mmol/L (ref 135–145)

## 2015-01-25 LAB — GLUCOSE, CAPILLARY
Glucose-Capillary: 117 mg/dL — ABNORMAL HIGH (ref 65–99)
Glucose-Capillary: 133 mg/dL — ABNORMAL HIGH (ref 65–99)
Glucose-Capillary: 104 mg/dL — ABNORMAL HIGH (ref 65–99)
Glucose-Capillary: 122 mg/dL — ABNORMAL HIGH (ref 65–99)
Glucose-Capillary: 84 mg/dL (ref 65–99)
Glucose-Capillary: 108 mg/dL — ABNORMAL HIGH (ref 65–99)

## 2015-01-25 LAB — HEMOGLOBIN A1C
Hgb A1c MFr Bld: 7.8 % — ABNORMAL HIGH (ref 4.8–5.6)
Mean Plasma Glucose: 177 mg/dL

## 2015-01-25 MED ORDER — CARVEDILOL 3.125 MG PO TABS
3.1250 mg | ORAL_TABLET | Freq: Two times a day (BID) | ORAL | Status: DC
  Administered 2015-01-25 – 2015-01-27 (×5): 3.125 mg via ORAL
  Filled 2015-01-25 (×5): qty 1

## 2015-01-25 MED ORDER — SODIUM CHLORIDE 0.9 % IJ SOLN
10.0000 mL | INTRAMUSCULAR | Status: DC | PRN
  Administered 2015-01-25: 30 mL
  Administered 2015-01-26 (×3): 10 mL
  Filled 2015-01-25 (×4): qty 40

## 2015-01-25 MED ORDER — FUROSEMIDE 10 MG/ML IJ SOLN
60.0000 mg | Freq: Two times a day (BID) | INTRAMUSCULAR | Status: DC
  Administered 2015-01-25 – 2015-01-27 (×4): 60 mg via INTRAVENOUS
  Filled 2015-01-25 (×4): qty 6

## 2015-01-25 NOTE — Consult Note (Signed)
Advanced Heart Failure Team Consult Note  Referring Physician: [place] Pink Primary Physician: Felipa Eth Primary Cardiologist:   New  Reason for Consultation: Acute Systolic HF  HPI:    Rebecca Hall is a 81 y.o. female with history of Breast Cancer treated with 3 doses only of herceptin in 2014, CKD, HTN, HLD, Osteoporosis, GERD, Hx of GI Bleed, and DM.    She presented to [facility] 01/19/15 for N/V/D, hypotension, and dizziness x 3-4 days.   She remained hypotensive despite 4L IVF, so was started on neosynephrine and admitted by [facility].   Blood pressures have since improved and pt was able to be weaned off of pressors.  She has been diuresing with IV lasix, and is currently back on home dose of oral lasix.    She is + 2.5 liters so far this admission.    CXR showed pulmonary edema R greater than left on admission. Repeat showed vascular congestion with increasing atelectasis vs infiltrates, seemed to improve with Lasix.    Echo shows new reduced EF at 30-35% compared to 1/15 when EF was normal. EKG remarkable for anterior Q waves.  She states she was at her usual state of health prior to this recent episode.  She denied any SOB, DOE, Orthopnea, PND, or CP at baseline.  She continues to deny chest pain but has had some SOB this admission.  Currently on 02 via [place] with relief.  She is on chronic lasix, and says she has had to increase it during the summer with increase fluid intake.  She has a lot of fluid intake, but tries to watch her fluid intake.  She denies any known MI or CVA.  Has not had any cardiac catheterizations in the past.    STUDIES:  CXR 7/28 >>> CM with interstitial edema. CT A/P 7/28 >>> no acute process. CXR 7/29 >>> left IJ in place  Review of Systems: [y] = yes, [ ]  = no   General: Weight gain [ ] ; Weight loss [ ] ; Anorexia [ ] ; Fatigue Blue.Reese ]; Fever [ ] ; Chills [ ] ; Weakness [ ]   Cardiac: Chest pain/pressure [ ] ; Resting SOB [ ] ; Exertional SOB [ y]; Orthopnea [ ] ; Pedal Edema [ ] ;  Palpitations [ ] ; Syncope [ ] ; Presyncope [ ] ; Paroxysmal nocturnal dyspnea[ ]   Pulmonary: Cough [ ] ; Wheezing[ ] ; Hemoptysis[ ] ; Sputum [ ] ; Snoring Blue.Reese ]  GI: Vomiting[ ] ; Dysphagia[ ] ; Melena[ ] ; Hematochezia [ ] ; Heartburn[ ] ; Abdominal pain [ ] ; Constipation [ ] ; Diarrhea [ ] ; BRBPR [ ]   GU: Hematuria[ ] ; Dysuria [ ] ; Nocturia[ ]   Vascular: Pain in legs with walking [ ] ; Pain in feet with lying flat [ ] ; Non-healing sores [ ] ; Stroke [ ] ; TIA [ ] ; Slurred speech [ ] ;  Neuro: Headaches[ ] ; Vertigo[ ] ; Seizures[ ] ; Paresthesias[ ] ;Blurred vision [ ] ; Diplopia [ ] ; Vision changes [ ]   Ortho/Skin: Arthritis Blue.Reese ]; Joint pain [ y]; Muscle pain [ ] ; Joint swelling [ ] ; Back Pain [ ] ; Rash [ ]   Psych: Depression[ ] ; Anxiety[ ]   Heme: Bleeding problems [ ] ; Clotting disorders [ ] ; Anemia [ ]   Endocrine: Diabetes [ y]; Thyroid dysfunction[ ]   Home Medications Prior to Admission medications   Medication Sig Start Date End Date Taking? Authorizing Provider  allopurinol (ZYLOPRIM) 100 MG tablet Take 100 mg by mouth daily. Total of 400mg  daily 02/05/13  Yes Historical Provider, MD  allopurinol (ZYLOPRIM) 300 MG tablet Take 300 mg by mouth daily. Total  of 400mg  daily   Yes Historical Provider, MD  anastrozole (ARIMIDEX) 1 MG tablet TAKE 1 TABLET (1 MG TOTAL) BY MOUTH DAILY. 06/14/14  Yes Chauncey Cruel, MD  aspirin 325 MG tablet Take 325 mg by mouth daily.   Yes Historical Provider, MD  atorvastatin (LIPITOR) 20 MG tablet Take 20 mg by mouth every morning.  02/23/13  Yes Historical Provider, MD  calcium carbonate (OS-CAL) 600 MG TABS tablet Take 600 mg by mouth 2 (two) times daily with a meal.    Yes Historical Provider, MD  diphenhydrAMINE (BENADRYL) 25 MG tablet Take 25 mg by mouth every 6 (six) hours as needed for allergies.   Yes Historical Provider, MD  diphenhydramine-acetaminophen (TYLENOL PM) 25-500 MG TABS Take 1 tablet by mouth at bedtime.    Yes Historical Provider, MD  furosemide (LASIX) 40 MG  tablet Take 40 mg by mouth daily.  03/08/13  Yes Historical Provider, MD  gabapentin (NEURONTIN) 300 MG capsule TAKE 1 CAPSULE BY MOUTH DAILY AT BEDTIME 12/27/14  Yes Chauncey Cruel, MD  glimepiride (AMARYL) 2 MG tablet Take 2 mg by mouth daily with breakfast.   Yes Historical Provider, MD  lisinopril (PRINIVIL,ZESTRIL) 20 MG tablet Take 20 mg by mouth daily.   Yes Historical Provider, MD  LORazepam (ATIVAN) 0.5 MG tablet Take 1 tablet (0.5 mg total) by mouth 2 (two) times daily as needed for anxiety. Patient taking differently: Take 0.25 mg by mouth 2 (two) times daily as needed for anxiety or sleep.  07/12/13  Yes Amy Milda Smart, PA-C  minoxidil (ROGAINE) 2 % external solution Apply 1 application topically daily.    Yes Historical Provider, MD  Multiple Vitamin (MULTIVITAMIN WITH MINERALS) TABS tablet Take 1 tablet by mouth 2 (two) times daily.    Yes Historical Provider, MD  omeprazole (PRILOSEC) 20 MG capsule Take 20 mg by mouth daily.  03/08/13  Yes Historical Provider, MD  Vitamin D, Ergocalciferol, (DRISDOL) 50000 UNITS CAPS capsule Take 50,000 Units by mouth every 14 (fourteen) days.    Yes Historical Provider, MD  Zoledronic Acid (ZOMETA) 4 MG/100ML IVPB Inject 4 mg into the vein every 6 (six) months.    Yes Historical Provider, MD  diazepam (VALIUM) 5 MG tablet Take 1 tablet (5 mg total) by mouth at bedtime as needed for anxiety. 01/07/14   Chauncey Cruel, MD  methylPREDNIsolone (MEDROL DOSPACK) 4 MG tablet follow package directions 07/12/13   Amy Milda Smart, PA-C  potassium chloride SA (K-DUR,KLOR-CON) 10 MEQ tablet TAKE TWO TABS TWICE A DAY 06/15/14   Chauncey Cruel, MD  triamcinolone ointment (KENALOG) 0.5 % Apply 1 application topically 2 (two) times daily. 06/14/14   Chauncey Cruel, MD    Past Medical History: Past Medical History  Diagnosis Date  . Hyperlipidemia   . Hypertension   . Chronic kidney disease   . Osteoporosis   . GERD (gastroesophageal reflux disease)   . IBS  (irritable bowel syndrome)   . Arthritis   . Neuromuscular disorder     peripheral neuropathy feet  . History of lower GI bleeding   . Breast cancer 03/15/13    right, 12 o'clock  . Diabetes mellitus without complication     Past Surgical History: Past Surgical History  Procedure Laterality Date  . Cataract extraction  2009    both  . Skin cancer excision      head, face, hand ..multiple years  . Abdominal hysterectomy  1975    No salpingo-oophorectomy  .  Tonsillectomy    . Colonoscopy    . Breast lumpectomy with needle localization and axillary sentinel lymph node bx Right 04/28/2013    Procedure: BREAST LUMPECTOMY WITH NEEDLE LOCALIZATION AND AXILLARY SENTINEL LYMPH NODE BX;  Surgeon: Rolm Bookbinder, MD;  Location: [facility];  Service: General;  Laterality: Right;    Family History: Family History  Problem Relation Age of Onset  . Cancer Maternal Aunt 80    colon cancer  . Cancer Maternal Uncle 65    lung  . Cancer Maternal Grandmother     pancreas    Social History: History   Social History  . Marital Status: Married    Spouse Name: N/A  . Number of Children: N/A  . Years of Education: N/A   Social History Main Topics  . Smoking status: Never Smoker   . Smokeless tobacco: Never Used  . Alcohol Use: No  . Drug Use: No  . Sexual Activity: Not on file     Comment: first live birth age 66, P 2, Estrogen    Other Topics Concern  . None   Social History Narrative    Allergies:  Allergies  Allergen Reactions  . Levaquin [Levofloxacin In D5w] Anaphylaxis and Nausea Only    Shaking real bad on medicaton  . Codeine Nausea And Vomiting    Pass out  . Herceptin [Trastuzumab] Rash    Patient has developed extensive body skin rash following each Herceptin treatment of increased presence following each treatment  that cleared with oral steroids  . Sulfa Antibiotics Swelling    Objective:    Vital Signs:   Temp:  [98 F (36.7 C)-99.1 F  (37.3 C)] 98 F (36.7 C) (08/03 0500) Pulse Rate:  [86-92] 92 (08/03 0500) Resp:  [22-26] 24 (08/03 0500) BP: (130-155)/(60-80) 155/68 mmHg (08/03 0500) SpO2:  [89 %-93 %] 91 % (08/03 0500) Weight:  [177 lb (80.287 kg)] 177 lb (80.287 kg) (08/03 0500) Last BM Date: 01/23/15  Weight change: Filed Weights   01/23/15 0403 01/24/15 0500 01/25/15 0500  Weight: 175 lb 4.3 oz (79.5 kg) 169 lb 15.6 oz (77.1 kg) 177 lb (80.287 kg)    Intake/Output:   Intake/Output Summary (Last 24 hours) at 01/25/15 1330 Last data filed at 01/25/15 0730  Gross per 24 hour  Intake    240 ml  Output    200 ml  Net     40 ml     Physical Exam: General:  Well appearing. No resp difficulty HEENT: normal Neck: supple. JVP 8-9. Carotids 2+ bilat; no bruits. No lymphadenopathy or thryomegaly appreciated. Cor: PMI nondisplaced. Regular rate & rhythm. No rubs, gallops or murmurs appreciated. Lungs: clear Abdomen: Obese, soft, nontender, mildly distended. No hepatosplenomegaly. No bruits or masses. Good bowel sounds. Extremities: no cyanosis, clubbing, rash. trace edema. Neuro: alert & orientedx3, cranial nerves grossly intact. moves all 4 extremities w/o difficulty. Affect pleasant   Labs: Basic Metabolic Panel:  Recent Labs Lab 01/20/15 0450 01/20/15 1928 01/21/15 1200 01/22/15 0435 01/23/15 0342 01/24/15 0450  NA 128* 137 145 143 139 137  K 5.3* 4.5 4.3 4.5 5.3* 5.0  CL 105 104 105 102 105 100*  CO2 13* 24 33* 31 28 28   GLUCOSE 295* 119* 125* 117* 124* 104*  BUN 72* 59* 38* 27* 14 14  CREATININE 3.37* 2.85* 2.10* 1.86* 1.61* 1.67*  CALCIUM 5.9* 6.7* 7.0* 7.1* 7.8* 8.2*  MG 1.0*  --   --  1.0* 1.7 1.7  PHOS  5.9*  --   --  2.4*  --  3.4    Liver Function Tests:  Recent Labs Lab 01/19/15 1700  AST 23  ALT 21  ALKPHOS 53  BILITOT 0.3  PROT 5.8*  ALBUMIN 3.3*   No results for input(s): LIPASE, AMYLASE in the last 168 hours. No results for input(s): AMMONIA in the last 168  hours.  CBC:  Recent Labs Lab 01/19/15 1700 01/20/15 0450 01/22/15 0435 01/23/15 0342 01/24/15 0450  WBC 6.1 6.7 3.6* 5.6 4.1  NEUTROABS 5.1  --   --   --   --   HGB 9.3* 8.9* 8.0* 8.5* 7.6*  HCT 27.6* 25.5* 24.1* 26.0* 23.8*  MCV 93.9 93.4 97.2 98.1 97.5  PLT 94* 96* 78* 81* 83*    Cardiac Enzymes: No results for input(s): CKTOTAL, CKMB, CKMBINDEX, TROPONINI in the last 168 hours.  BNP: BNP (last 3 results) No results for input(s): BNP in the last 8760 hours.  ProBNP (last 3 results) No results for input(s): PROBNP in the last 8760 hours.   CBG:  Recent Labs Lab 01/24/15 1608 01/24/15 2148 01/24/15 2336 01/25/15 0814 01/25/15 1200  GLUCAP 120* 133* 117* 104* 122*    Coagulation Studies: No results for input(s): LABPROT, INR in the last 72 hours.  Other results:  Imaging:  No results found.   Medications:     Current Medications: . allopurinol  400 mg Oral Daily  . amoxicillin-clavulanate  1 tablet Oral BID  . anastrozole  1 mg Oral Daily  . antiseptic oral rinse  7 mL Mouth Rinse q12n4p  . aspirin  325 mg Oral Daily  . atorvastatin  20 mg Oral q morning - 10a  . chlorhexidine  15 mL Mouth Rinse BID  . furosemide  60 mg Intravenous Q12H  . insulin aspart  0-15 Units Subcutaneous TID WC & HS  . magnesium sulfate 1 - 4 g bolus IVPB  2 g Intravenous Once  . pantoprazole  40 mg Oral Daily  . sodium polystyrene  30 g Oral Once  . venlafaxine  50 mg Oral BID WC     Infusions:      Assessment/Plan   1. Acute systolic CHF : EF 99991111, decreased from normal in 1/15 2. AKI on CKD, Stage 3 - has improved greatly since admission.  Baseline appears to be 1.4-1.6. 3. Hx of Breast Cancer - 3 doses of Herceptin in 12 /14 4. DM2 x 2 years 5. HTN 6. HLD 7. Heavy snoring - probable OSA  Herceptin causes reversible LV dysfunction and pt only received 3 doses nearly two years ago, so highly unlikely this is the cause.   With EKG findings of anterior Q  wave, likely that patient has had an MI in time since her previous echo.  Will schedule R/LHC this admission if pt agrees in order to assess coronary status and hemodynamic status.  Risks and benefits of heart catheterization discussed with patient.   Length of Stay: 6  Shirley Friar PA-C 01/25/2015, 1:30 PM  Advanced Heart Failure Team Pager [phone] (M-F; 7a - 4p)  Please contact [facility] for night-coverage after hours (4p -7a ) and weekends on [email or web address]  Patient seen and examined with Oda Kilts, PA-C. We discussed all aspects of the encounter. I agree with the assessment and plan as stated above.   She has new onset LV dysfunction. This is NOT Herceptin cardiotoxicity as dose was remote and Herceptin is associated with transient LV dysfunction that  resolves with removal of the drug.  Echo shows EF 30-35% with anterior wall motion abnormality. ECG with anterior Q waves. Thus, despite lack of ischemic symptoms, ischemic CM is highest on list. Will plan to proceed with L/R cath tomorrow to further assess. If coronaries OK, may have viral CM or related to undiagnosed OSA. Will start low-dose carvedilol. Plan to initiate ACE after cath. Will need outpatient sleep study.  I have reviewed the risks, indications, and alternatives to angioplasty and stenting with the patient. Risks include but are not limited to bleeding, infection, vascular injury, stroke, myocardial infection, arrhythmia, kidney injury, radiation-related injury in the case of prolonged fluoroscopy use, emergency cardiac surgery, and death. The patient understands the risks of serious complication is low ([id number]) and he agrees to proceed.   Kealan Buchan,MD 2:36 PM

## 2015-01-25 NOTE — Progress Notes (Signed)
Utilization Review completed. Eve Rey RN BSN CM 

## 2015-01-25 NOTE — Progress Notes (Signed)
Occupational Therapy Treatment Patient Details Name: Rebecca Hall MRN: [id number] DOB: [birth year] Today's Date: 01/25/2015    History of present illness 81 y.o. F brought to [facility] ED with hypotension, N/V/D. In ED, remained hypotensive despite 4L IVF; therefore, started on neosynephrine peripherally.   OT comments  Pt upset with being NPO since midnight and prolonged hospitalization.  Pt moving much better.  Educated pt in energy conservation. Will continue to follow.  Follow Up Recommendations  Home health OT    Equipment Recommendations  None recommended by OT    Recommendations for Other Services      Precautions / Restrictions Precautions Precautions: Fall Precaution Comments: watch sats       Mobility Bed Mobility   Bed Mobility: Supine to Sit;Sit to Supine     Supine to sit: Supervision;HOB elevated Sit to supine: Supervision;HOB elevated   General bed mobility comments: HOB up and use of rail  Transfers                      Balance                                   ADL Overall ADL's : Needs assistance/impaired     Grooming: Wash/dry hands;Min Administrator Transfer: Min guard;Stand-pivot;BSC   Toileting- Water quality scientist and Hygiene: Min guard;Sit to/from stand         General ADL Comments: Educated in energy conservation and reviewed handout.      Vision                     Perception     Praxis      Cognition   Behavior During Therapy: Agitated Overall Cognitive Status: Within Functional Limits for tasks assessed                       Extremity/Trunk Assessment               Exercises     Shoulder Instructions       General Comments      Pertinent Vitals/ Pain       Pain Assessment: No/denies pain  Home Living                                          Prior Functioning/Environment              Frequency Min 2X/week      Progress Toward Goals  OT Goals(current goals can now be found in the care plan section)  Progress towards OT goals: Progressing toward goals  Acute Rehab OT Goals Patient Stated Goal: to go home with husband Time For Goal Achievement: 02/07/15 Potential to Achieve Goals: Good  Plan Discharge plan remains appropriate    Co-evaluation                 End of Session     Activity Tolerance Patient tolerated treatment well   Patient Left in bed;with call bell/phone within reach;with family/visitor present   Nurse Communication          Time: [id number] OT Time Calculation (min): 21 min  Charges: OT General Charges $OT Visit: 1  Procedure OT Treatments $Self Care/Home Management : 8-22 mins  Malka So 01/25/2015, 1:35 PM  [phone]

## 2015-01-25 NOTE — Progress Notes (Signed)
COURTESY NOTE: Called today by Dr Maryland Pink re pat's EF 30-35% on echo this AM (had nl EF 07/14/2013). Patient's oncology history is summarized below--she received 3 doses of trastuzumab only and had normal EF post treatment, so do not think that will be related. She had viral Sx prior to admission and this could be a viral cardiomyopathy or more likely due to CAD. Cardiology evaluation is pending and  Drs Aundra Dubin or Paoli may have a special interest in this oncocardiology case.  Called patient and spoke w her and her husband. She is agreeable to further in-hospital evaluation.  I will be out of town until 02/06/2015 but please contact my partners if you feel we can be of further help.   81 y.o.  . [place] woman, status post right breast biopsy 03/15/2013 for a clinical T1c N0, stage IA invasive ductal carcinoma, grade 1 or 2, estrogen receptor 100% positive, progesterone receptor 100% positive, with an MIB-1 of 30%, and HER-2 amplified by CISH, with a ratio of 3.51 and an average HER-2 copy number Damita Lack of 6.85  (1) biopsy of 2 suspicious areas in the left breast both showed no evidence of malignancy  (2) right lumpectomy and sentinel lymph node sampling 04/28/2013 showed a pT1b pN0, stage IA invasive ductal carcinoma, grade 2, with negative margins  (3) trastuzumab started 05/31/2013, to be given every 3 weeks for total of 9 doses; being held as of 07/16/2013 after 3 doses because of possible allergic dermatitis associated with the drug.   (4) anastrozole started 05/08/2013 (a) bon scan 06/10/2013 showed osteopenia with a T score of -2.1 (b) zolendronate started 09/13/2013, to be given every 6 months for 2 years, most recent dose 10/17/2014   (6) did not need radiation therapy.  Thank you for your help to this patient!

## 2015-01-25 NOTE — Progress Notes (Addendum)
[facility] PROGRESS NOTE  Rebecca BECHERER [id number] DOB: September 08, 1931 DOA: 01/19/2015 PCP: Mathews Argyle, MD  Brief history of present illness 81 year old Caucasian female with history of breast cancer presented with hypovolemic shock. She required pressors initially. She was volume resuscitated. She was transferred to the floor. Developed fluid overload and pulmonary edema. Started on IV Lasix. Echocardiogram showed EF of 30%, which is new compared to last year. Cardiology has been consulted. Plan is for catheterization tomorrow. On IV Lasix.  Assessment/Plan:  Acute hypoxic respiratory failure/acute on chronic diastolic CHF exacerbation/new acute systolic CHF Perhaps due to volume resuscitation, patient developed pulmonary edema. She had high oxygen requirements. Chest x-ray also suggested the same. Patient was started on IV Lasix. She had good response. Continues to be short of breath. Still has crackles in her lungs. Echocardiogram report available and reveals EF of 30%. This is new compared to echocardiogram from last year. Discussed with her oncologist, Dr. Jana Hakim. He does not feel that any of the medications being used for her breast cancer is contributing. Discussed with Dr. Haroldine Laws, who will consult on this patient. Place her back on IV Lasix. Repeat labs today.   Hypovolemic shock Now resolved. Likely secondary to volume depletion from nausea, vomiting and diarrhea. On admission patient was placed on pressors as she was hypotensive despite 4 L of IV fluids. Patient was taken off of pressors. Blood pressures improved. Patient has been pancultured. Blood cultures negative so far. Pro calcitonin was less than 0.10. Lactic acid level on 01/19/2015 was 0.56. Patient was initially on broad-spectrum antibiotics. Then changed over to Augmentin. Completed total of 7 days of treatment. Last day will be tomorrow. Continue to hold antihypertensives medications. Follow.  Acute on  chronic kidney disease stage II (baseline creatinine 1.4) He presented with a creatinine of 4.78 and a BUN of 99. With hydration renal function has improved. Was likely secondary to a prerenal azotemia. Monitor renal function closely while she is getting Lasix. Calcium was borderline high yesterday. We will repeat labs today.  Electrolyte abnormalities/hyperkalemia/hypomagnesemia/hyponatremia Hyperkalemia secondary to acute renal failure. Hyperkalemia status post Kayexalate, calcium gluconate. Repeat labs pending from today. Hypomagnesemia and hyponatremia likely secondary to GI losses. Hyponatremia secondary to hypovolemia which has resolved with hydration. Follow replete electrolytes as needed.  Nausea vomiting diarrhea Improved. She is tolerating her diet.   Nonanion gap acidosis Secondary to nausea vomiting diarrhea. Resolved with bicarbonate drip.  Diabetes mellitus, type II Hemoglobin A1c = 7.8. Patient does not like the food here. CBGs have ranged from 97 -155. Continue current regular diet. Continue sliding scale insulin.  Gastroesophageal reflux disease/IBS PPI.  Hx breast cancer status post right lumpectomy Followed by Dr. Jana Hakim. Continue anastrozole.  Back pain Pain management.  Pancytopenia Likely secondary to anastrozole. Platelet count trending back up. Off heparin.  Hot flashes Likely secondary to anastrozole.  Prophylaxis PPI for GI prophylaxis. SCDs for DVT prophylaxis.  Code Status: Full Family Communication: Updated patient. No family at bedside. Disposition Plan: Transfer to  SDU.   Consultants:  PCCM admit  Procedures: CXR 7/28 >>> CM with interstitial edema. CT A/P 7/28 >>> no acute process. CXR 7/29 >>> left IJ in place Left IJ 01/20/2015  2D ECHO Study Conclusions - Left ventricle: The cavity size was normal. Wall thickness wasincreased in a pattern of mild LVH. Systolic function wasmoderately to severely reduced. The estimated ejection  fractionwas in the range of 30% to 35%. Akinesis of themid-apicalanteroseptal myocardium. Features are consistent with apseudonormal left ventricular filling  pattern, with concomitantabnormal relaxation and increased filling pressure (grade 2diastolic dysfunction). - Mitral valve: There was mild regurgitation. - Right ventricle: The cavity size was mildly dilated. Systolicfunction was mildly reduced.  Antibiotics: Vanc, start date 7/28>>>7/29 Abx: Zosyn, start date 7/28>>>01/23/15 augmentin 01/23/15  Subjective: Patient wants to go home. She was belligerent this morning. Denies any pain. Appears to be dyspneic but denies any shortness of breath.   Objective: Filed Vitals:   01/25/15 0500  BP: 155/68  Pulse: 92  Temp: 98 F (36.7 C)  Resp: 24    Intake/Output Summary (Last 24 hours) at 01/25/15 1119 Last data filed at 01/25/15 0730  Gross per 24 hour  Intake    300 ml  Output    200 ml  Net    100 ml   Filed Weights   01/23/15 0403 01/24/15 0500 01/25/15 0500  Weight: 79.5 kg (175 lb 4.3 oz) 77.1 kg (169 lb 15.6 oz) 80.287 kg (177 lb)    Exam:   General:  Irritable and belligerent  Cardiovascular: S1, S2 is normal, regular.  Respiratory: Crackles bilateral bases. No wheezing or rhonchi.  Abdomen: Soft, nontender, nondistended, positive bowel sounds.  Musculoskeletal: No clubbing cyanosis or edema.  Data Reviewed: Basic Metabolic Panel:  Recent Labs Lab 01/20/15 0450 01/20/15 1928 01/21/15 1200 01/22/15 0435 01/23/15 0342 01/24/15 0450  NA 128* 137 145 143 139 137  K 5.3* 4.5 4.3 4.5 5.3* 5.0  CL 105 104 105 102 105 100*  CO2 13* 24 33* 31 28 28   GLUCOSE 295* 119* 125* 117* 124* 104*  BUN 72* 59* 38* 27* 14 14  CREATININE 3.37* 2.85* 2.10* 1.86* 1.61* 1.67*  CALCIUM 5.9* 6.7* 7.0* 7.1* 7.8* 8.2*  MG 1.0*  --   --  1.0* 1.7 1.7  PHOS 5.9*  --   --  2.4*  --  3.4   Liver Function Tests:  Recent Labs Lab 01/19/15 1700  AST 23  ALT 21  ALKPHOS  53  BILITOT 0.3  PROT 5.8*  ALBUMIN 3.3*   CBC:  Recent Labs Lab 01/19/15 1700 01/20/15 0450 01/22/15 0435 01/23/15 0342 01/24/15 0450  WBC 6.1 6.7 3.6* 5.6 4.1  NEUTROABS 5.1  --   --   --   --   HGB 9.3* 8.9* 8.0* 8.5* 7.6*  HCT 27.6* 25.5* 24.1* 26.0* 23.8*  MCV 93.9 93.4 97.2 98.1 97.5  PLT 94* 96* 78* 81* 83*   CBG:  Recent Labs Lab 01/24/15 1233 01/24/15 1608 01/24/15 2148 01/24/15 2336 01/25/15 0814  GLUCAP 106* 120* 133* 117* 104*    Recent Results (from the past 240 hour(s))  Blood culture (routine x 2)     Status: None   Collection Time: 01/19/15  5:13 PM  Result Value Ref Range Status   Specimen Description BLOOD RIGHT HAND  Final   Special Requests BOTTLES DRAWN AEROBIC AND ANAEROBIC 5CC  Final   Culture NO GROWTH 5 DAYS  Final   Report Status 01/24/2015 FINAL  Final  Blood culture (routine x 2)     Status: None   Collection Time: 01/19/15  5:25 PM  Result Value Ref Range Status   Specimen Description BLOOD RIGHT FOREARM  Final   Special Requests BOTTLES DRAWN AEROBIC AND ANAEROBIC 5ML  Final   Culture NO GROWTH 5 DAYS  Final   Report Status 01/24/2015 FINAL  Final  MRSA PCR Screening     Status: None   Collection Time: 01/19/15 10:21 PM  Result Value Ref  Range Status   MRSA by PCR NEGATIVE NEGATIVE Final    Comment:        The GeneXpert MRSA Assay (FDA approved for NASAL specimens only), is one component of a comprehensive MRSA colonization surveillance program. It is not intended to diagnose MRSA infection nor to guide or monitor treatment for MRSA infections.   Urine culture     Status: None   Collection Time: 01/20/15  5:49 AM  Result Value Ref Range Status   Specimen Description URINE, CLEAN CATCH  Final   Special Requests NONE  Final   Culture NO GROWTH 1 DAY  Final   Report Status 01/21/2015 FINAL  Final     Studies: No results found.  Scheduled Meds: . allopurinol  400 mg Oral Daily  . amoxicillin-clavulanate  1 tablet  Oral BID  . anastrozole  1 mg Oral Daily  . antiseptic oral rinse  7 mL Mouth Rinse q12n4p  . aspirin  325 mg Oral Daily  . atorvastatin  20 mg Oral q morning - 10a  . chlorhexidine  15 mL Mouth Rinse BID  . furosemide  60 mg Intravenous Q12H  . insulin aspart  0-15 Units Subcutaneous TID WC & HS  . magnesium sulfate 1 - 4 g bolus IVPB  2 g Intravenous Once  . pantoprazole  40 mg Oral Daily  . sodium polystyrene  30 g Oral Once  . venlafaxine  50 mg Oral BID WC   Continuous Infusions:    Principal Problem:   Hypovolemic shock Active Problems:   Encounter for central line placement   Respiratory failure   Acidosis, metabolic   Hyperkalemia   Acute renal failure syndrome   Nausea vomiting and diarrhea   Hypomagnesemia   Renal failure (ARF), acute on chronic: Stage 2   Hot flashes   Shock    Time spent: 29 mins   Carlynn Leduc MD [facility] Pager [phone].   If 7PM-7AM, please contact night-coverage at [email or web address], password [facility] 01/25/2015, 11:19 AM  LOS: 6 days

## 2015-01-25 NOTE — Care Management Note (Signed)
Case Management Note  Patient Details  Name: Rebecca Hall MRN: [id number] Date of Birth: [birth year]  Subjective/Objective:   NCM spoke with patient , she lives with her spouse who is with her 24 hrs,  NCM  Will cont to follow for dc needs.                   Action/Plan:   Expected Discharge Date:  01/23/15               Expected Discharge Plan:  [place]  In-House Referral:     Discharge planning Services  CM Consult  Post Acute Care Choice:    Choice offered to:     DME Arranged:    DME Agency:     HH Arranged:    HH Agency:     Status of Service:  In process, will continue to follow  Medicare Important Message Given:  Yes-second notification given Date Medicare IM Given:    Medicare IM give by:    Date Additional Medicare IM Given:    Additional Medicare Important Message give by:     If discussed at [place] of Stay Meetings, dates discussed:    Additional Comments:  Zenon Mayo, RN 01/25/2015, 11:05 AM

## 2015-01-26 ENCOUNTER — Encounter (HOSPITAL_COMMUNITY): Payer: Self-pay | Admitting: Internal Medicine

## 2015-01-26 ENCOUNTER — Encounter (HOSPITAL_COMMUNITY)
Admission: EM | Disposition: A | Discharge status: Home Health | Payer: PPO | Source: Home / Self Care | Attending: Pulmonary Disease

## 2015-01-26 DIAGNOSIS — R197 Diarrhea, unspecified: Secondary | ICD-10-CM

## 2015-01-26 DIAGNOSIS — R571 Hypovolemic shock: Principal | ICD-10-CM

## 2015-01-26 DIAGNOSIS — N183 Chronic kidney disease, stage 3 unspecified: Secondary | ICD-10-CM

## 2015-01-26 DIAGNOSIS — N179 Acute kidney failure, unspecified: Secondary | ICD-10-CM

## 2015-01-26 DIAGNOSIS — R112 Nausea with vomiting, unspecified: Secondary | ICD-10-CM

## 2015-01-26 DIAGNOSIS — I2510 Atherosclerotic heart disease of native coronary artery without angina pectoris: Secondary | ICD-10-CM

## 2015-01-26 DIAGNOSIS — E1122 Type 2 diabetes mellitus with diabetic chronic kidney disease: Secondary | ICD-10-CM

## 2015-01-26 DIAGNOSIS — N189 Chronic kidney disease, unspecified: Secondary | ICD-10-CM

## 2015-01-26 DIAGNOSIS — E1129 Type 2 diabetes mellitus with other diabetic kidney complication: Secondary | ICD-10-CM

## 2015-01-26 HISTORY — PX: CARDIAC CATHETERIZATION: SHX172

## 2015-01-26 LAB — POCT I-STAT 3, ART BLOOD GAS (G3+)
Acid-Base Excess: 7 mmol/L — ABNORMAL HIGH (ref 0.0–2.0)
Bicarbonate: 30.7 mEq/L — ABNORMAL HIGH (ref 20.0–24.0)
O2 Saturation: 93 %
TCO2: 32 mmol/L (ref 0–100)
pCO2 arterial: 37.8 mmHg (ref 35.0–45.0)
pH, Arterial: 7.517 — ABNORMAL HIGH (ref 7.350–7.450)
pO2, Arterial: 59 mmHg — ABNORMAL LOW (ref 80.0–100.0)

## 2015-01-26 LAB — BASIC METABOLIC PANEL
Anion gap: 11 (ref 5–15)
BUN: 14 mg/dL (ref 6–20)
CO2: 27 mmol/L (ref 22–32)
Calcium: 8.7 mg/dL — ABNORMAL LOW (ref 8.9–10.3)
Chloride: 99 mmol/L — ABNORMAL LOW (ref 101–111)
Creatinine, Ser: 1.43 mg/dL — ABNORMAL HIGH (ref 0.44–1.00)
GFR calc Af Amer: 39 mL/min — ABNORMAL LOW (ref >60)
GFR calc non Af Amer: 33 mL/min — ABNORMAL LOW (ref >60)
Glucose, Bld: 114 mg/dL — ABNORMAL HIGH (ref 65–99)
Potassium: 3.9 mmol/L (ref 3.5–5.1)
Sodium: 137 mmol/L (ref 135–145)

## 2015-01-26 LAB — CBC
HCT: 24.3 % — ABNORMAL LOW (ref 36.0–46.0)
HCT: 25.4 % — ABNORMAL LOW (ref 36.0–46.0)
Hemoglobin: 7.8 g/dL — ABNORMAL LOW (ref 12.0–15.0)
Hemoglobin: 8.2 g/dL — ABNORMAL LOW (ref 12.0–15.0)
MCH: 31.6 pg (ref 26.0–34.0)
MCH: 31.4 pg (ref 26.0–34.0)
MCHC: 32.1 g/dL (ref 30.0–36.0)
MCHC: 32.3 g/dL (ref 30.0–36.0)
MCV: 98.4 fL (ref 78.0–100.0)
MCV: 97.3 fL (ref 78.0–100.0)
Platelets: 110 10*3/uL — ABNORMAL LOW (ref 150–400)
Platelets: 108 10*3/uL — ABNORMAL LOW (ref 150–400)
RBC: 2.47 MIL/uL — ABNORMAL LOW (ref 3.87–5.11)
RBC: 2.61 MIL/uL — ABNORMAL LOW (ref 3.87–5.11)
RDW: 14.8 % (ref 11.5–15.5)
RDW: 14.9 % (ref 11.5–15.5)
WBC: 4.4 10*3/uL (ref 4.0–10.5)
WBC: 4 10*3/uL (ref 4.0–10.5)

## 2015-01-26 LAB — POCT I-STAT 3, VENOUS BLOOD GAS (G3P V)
Acid-Base Excess: 6 mmol/L — ABNORMAL HIGH (ref 0.0–2.0)
Acid-Base Excess: 7 mmol/L — ABNORMAL HIGH (ref 0.0–2.0)
Bicarbonate: 29.7 mEq/L — ABNORMAL HIGH (ref 20.0–24.0)
Bicarbonate: 31 mEq/L — ABNORMAL HIGH (ref 20.0–24.0)
O2 Saturation: 51 %
O2 Saturation: 51 %
TCO2: 31 mmol/L (ref 0–100)
TCO2: 32 mmol/L (ref 0–100)
pCO2, Ven: 41.1 mmHg — ABNORMAL LOW (ref 45.0–50.0)
pCO2, Ven: 41.1 mmHg — ABNORMAL LOW (ref 45.0–50.0)
pH, Ven: 7.468 — ABNORMAL HIGH (ref 7.250–7.300)
pH, Ven: 7.486 — ABNORMAL HIGH (ref 7.250–7.300)
pO2, Ven: 25 mmHg — CL (ref 30.0–45.0)
pO2, Ven: 26 mmHg — CL (ref 30.0–45.0)

## 2015-01-26 LAB — CREATININE, SERUM
Creatinine, Ser: 1.46 mg/dL — ABNORMAL HIGH (ref 0.44–1.00)
GFR calc Af Amer: 38 mL/min — ABNORMAL LOW (ref >60)
GFR calc non Af Amer: 33 mL/min — ABNORMAL LOW (ref >60)

## 2015-01-26 LAB — GLUCOSE, CAPILLARY
Glucose-Capillary: 118 mg/dL — ABNORMAL HIGH (ref 65–99)
Glucose-Capillary: 143 mg/dL — ABNORMAL HIGH (ref 65–99)
Glucose-Capillary: 120 mg/dL — ABNORMAL HIGH (ref 65–99)
Glucose-Capillary: 94 mg/dL (ref 65–99)
Glucose-Capillary: 96 mg/dL (ref 65–99)
Glucose-Capillary: 129 mg/dL — ABNORMAL HIGH (ref 65–99)

## 2015-01-26 LAB — SURGICAL PCR SCREEN
MRSA, PCR: NEGATIVE
Staphylococcus aureus: NEGATIVE

## 2015-01-26 LAB — PROTIME-INR
INR: 1.13 (ref 0.00–1.49)
Prothrombin Time: 14.7 seconds (ref 11.6–15.2)

## 2015-01-26 SURGERY — RIGHT/LEFT HEART CATH AND CORONARY ANGIOGRAPHY
Anesthesia: LOCAL

## 2015-01-26 MED ORDER — LIDOCAINE HCL (PF) 1 % IJ SOLN
INTRAMUSCULAR | Status: AC
  Filled 2015-01-26: qty 30

## 2015-01-26 MED ORDER — ONDANSETRON HCL 4 MG/2ML IJ SOLN: 4.0000 mg | Freq: Four times a day (QID) | INTRAMUSCULAR | Status: DC | PRN

## 2015-01-26 MED ORDER — FENTANYL CITRATE (PF) 100 MCG/2ML IJ SOLN
INTRAMUSCULAR | Status: DC | PRN
  Administered 2015-01-26: 25 ug via INTRAVENOUS

## 2015-01-26 MED ORDER — SODIUM CHLORIDE 0.9 % IJ SOLN: 3.0000 mL | INTRAMUSCULAR | Status: DC | PRN

## 2015-01-26 MED ORDER — PROMETHAZINE HCL 25 MG/ML IJ SOLN
12.5000 mg | Freq: Four times a day (QID) | INTRAMUSCULAR | Status: DC | PRN
  Administered 2015-01-27: 12.5 mg via INTRAVENOUS
  Filled 2015-01-26: qty 1

## 2015-01-26 MED ORDER — HEPARIN (PORCINE) IN NACL 2-0.9 UNIT/ML-% IJ SOLN
INTRAMUSCULAR | Status: AC
  Filled 2015-01-26: qty 1500

## 2015-01-26 MED ORDER — ACETAMINOPHEN 325 MG PO TABS: 650.0000 mg | ORAL_TABLET | ORAL | Status: DC | PRN

## 2015-01-26 MED ORDER — MIDAZOLAM HCL 2 MG/2ML IJ SOLN
INTRAMUSCULAR | Status: AC
  Filled 2015-01-26: qty 4

## 2015-01-26 MED ORDER — SODIUM CHLORIDE 0.9 % IV SOLN: INTRAVENOUS | Status: DC

## 2015-01-26 MED ORDER — SODIUM CHLORIDE 0.9 % IV SOLN: 250.0000 mL | INTRAVENOUS | Status: DC | PRN

## 2015-01-26 MED ORDER — SODIUM CHLORIDE 0.9 % IJ SOLN
3.0000 mL | Freq: Two times a day (BID) | INTRAMUSCULAR | Status: DC
  Administered 2015-01-26: 3 mL via INTRAVENOUS

## 2015-01-26 MED ORDER — IOHEXOL 350 MG/ML SOLN
INTRAVENOUS | Status: DC | PRN
  Administered 2015-01-26: 35 mL via INTRA_ARTERIAL

## 2015-01-26 MED ORDER — NITROGLYCERIN 1 MG/10 ML FOR IR/CATH LAB
INTRA_ARTERIAL | Status: DC | PRN
  Administered 2015-01-26: 14:00:00

## 2015-01-26 MED ORDER — FENTANYL CITRATE (PF) 100 MCG/2ML IJ SOLN
INTRAMUSCULAR | Status: AC
  Filled 2015-01-26: qty 4

## 2015-01-26 MED ORDER — SODIUM CHLORIDE 0.9 % IJ SOLN: 3.0000 mL | Freq: Two times a day (BID) | INTRAMUSCULAR | Status: DC

## 2015-01-26 MED ORDER — HEPARIN SODIUM (PORCINE) 5000 UNIT/ML IJ SOLN
5000.0000 [IU] | Freq: Three times a day (TID) | INTRAMUSCULAR | Status: DC
  Administered 2015-01-26 – 2015-01-27 (×2): 5000 [IU] via SUBCUTANEOUS
  Filled 2015-01-26 (×2): qty 1

## 2015-01-26 MED ORDER — SODIUM CHLORIDE 0.9 % IV SOLN
INTRAVENOUS | Status: AC
  Administered 2015-01-26: 16:00:00 via INTRAVENOUS

## 2015-01-26 MED ORDER — LIDOCAINE HCL (PF) 1 % IJ SOLN: INTRAMUSCULAR | Status: DC | PRN

## 2015-01-26 MED ORDER — SODIUM CHLORIDE 0.9 % IJ SOLN
3.0000 mL | INTRAMUSCULAR | Status: DC | PRN
  Administered 2015-01-26: 3 mL via INTRAVENOUS
  Filled 2015-01-26: qty 3

## 2015-01-26 MED ORDER — MIDAZOLAM HCL 2 MG/2ML IJ SOLN
INTRAMUSCULAR | Status: DC | PRN
  Administered 2015-01-26: 1 mg via INTRAVENOUS

## 2015-01-26 SURGICAL SUPPLY — 11 items
CATH INFINITI 5FR MULTPACK ANG (CATHETERS) ×2 IMPLANT
CATH SWAN GANZ 7F STRAIGHT (CATHETERS) ×2 IMPLANT
GLIDESHEATH SLEND SS 6F .021 (SHEATH) ×2 IMPLANT
KIT HEART LEFT (KITS) ×2 IMPLANT
KIT HEART RIGHT NAMIC (KITS) ×2 IMPLANT
PACK CARDIAC CATHETERIZATION (CUSTOM PROCEDURE TRAY) ×2 IMPLANT
SHEATH PINNACLE 5F 10CM (SHEATH) ×2 IMPLANT
SHEATH PINNACLE 7F 10CM (SHEATH) ×2 IMPLANT
SYR MEDRAD MARK V 150ML (SYRINGE) ×2 IMPLANT
TRANSDUCER W/STOPCOCK (MISCELLANEOUS) ×2 IMPLANT
WIRE EMERALD 3MM-J .035X150CM (WIRE) ×2 IMPLANT

## 2015-01-26 NOTE — Progress Notes (Signed)
PT Cancellation Note  Patient Details Name: Rebecca Hall MRN: [id number] DOB: Nov 05, 1931   Cancelled Treatment:    Reason Eval/Treat Not Completed: Patient at procedure or test/unavailable (cardiac cath). Will re-attempt tomorrow.   Rebecca Hall 01/26/2015, 1:58 PM  [place]

## 2015-01-26 NOTE — H&P (View-Only) (Signed)
Advanced Heart Failure Rounding Note  Referring Physician: Maryland Pink Primary Physician: Felipa Eth Primary Cardiologist: New  Subjective:    Has no complaints, but is a little apprehensive about cath.  Says she watched a video last night and has no further questions. Cath to be performed today. Pt discussed risks and benefits with Dr Haroldine Laws yesterday.     Objective:   Weight Range: 167 lb 14.4 oz (76.159 kg) Body mass index is 30.7 kg/(m^2).   Vital Signs:   Temp:  [98.4 F (36.9 C)] 98.4 F (36.9 C) (08/04 0604) Pulse Rate:  [73-109] 78 (08/04 0621) Resp:  [18] 18 (08/04 0604) BP: (149-160)/(61-80) 160/80 mmHg (08/04 0604) SpO2:  [84 %-98 %] 96 % (08/04 0621) Weight:  [167 lb 14.4 oz (76.159 kg)] 167 lb 14.4 oz (76.159 kg) (08/04 0621) Last BM Date: 01/23/15  Weight change: Filed Weights   01/24/15 0500 01/25/15 0500 01/26/15 0621  Weight: 169 lb 15.6 oz (77.1 kg) 177 lb (80.287 kg) 167 lb 14.4 oz (76.159 kg)    Intake/Output:   Intake/Output Summary (Last 24 hours) at 01/26/15 0835 Last data filed at 01/26/15 0735  Gross per 24 hour  Intake     30 ml  Output   3390 ml  Net  -3360 ml     Physical Exam: General: Well appearing. No resp difficulty HEENT: normal Neck: supple. JVP 8-9. Carotids 2+ bilat; no bruits. No lymphadenopathy or thryomegaly appreciated. Cor: PMI nondisplaced. Regular rate & rhythm. No rubs, gallops or murmurs appreciated. Lungs: clear Abdomen: Obese, soft, nontender, mildly distended. No hepatosplenomegaly. No bruits or masses. Good bowel sounds. Extremities: no cyanosis, clubbing, rash. trace edema. Neuro: alert & orientedx3, cranial nerves grossly intact. moves all 4 extremities w/o difficulty. Affect pleasant  Telemetry: NSR 70s. Tele sometimes called afib, but p waves present.  Lots of artifact  Labs: CBC  Recent Labs  01/25/15 1333 01/26/15 0508  WBC 4.8 4.4  HGB 8.3* 7.8*  HCT 26.1* 24.3*  MCV 100.0 98.4  PLT 94* 110*    Basic Metabolic Panel  Recent Labs  01/24/15 0450 01/25/15 1333 01/26/15 0508  NA 137 137 137  K 5.0 4.5 3.9  CL 100* 98* 99*  CO2 28 26 27   GLUCOSE 104* 163* 114*  BUN 14 14 14   CALCIUM 8.2* 8.8* 8.7*  MG 1.7  --   --   PHOS 3.4  --   --    Liver Function Tests No results for input(s): AST, ALT, ALKPHOS, BILITOT, PROT, ALBUMIN in the last 72 hours. No results for input(s): LIPASE, AMYLASE in the last 72 hours. Cardiac Enzymes No results for input(s): CKTOTAL, CKMB, CKMBINDEX, TROPONINI in the last 72 hours.  BNP: BNP (last 3 results) No results for input(s): BNP in the last 8760 hours.  ProBNP (last 3 results) No results for input(s): PROBNP in the last 8760 hours.   D-Dimer No results for input(s): DDIMER in the last 72 hours. Hemoglobin A1C  Recent Labs  01/24/15 0450  HGBA1C 7.8*   Fasting Lipid Panel No results for input(s): CHOL, HDL, LDLCALC, TRIG, CHOLHDL, LDLDIRECT in the last 72 hours. Thyroid Function Tests No results for input(s): TSH, T4TOTAL, T3FREE, THYROIDAB in the last 72 hours.  Invalid input(s): FREET3  Other results:     Imaging/Studies:  Dg Chest 2 View  01/25/2015   CLINICAL DATA:  Dyspnea. Breast cancer. Chronic kidney disease. Hypertension.  EXAM: CHEST  2 VIEW  COMPARISON:  01/22/2015 chest radiograph  FINDINGS: Left internal  jugular central venous catheter terminates at the cavoatrial junction. Stable cardiomediastinal silhouette with mild cardiomegaly. No pneumothorax. Small bilateral pleural effusions, likely stable. Mild pulmonary edema, slightly worsened. Bibasilar lung opacities, not appreciably changed, likely atelectasis and edema. Surgical clips in the right breast. Moderate degenerative changes in the thoracic spine.  IMPRESSION: 1. Mild congestive heart failure, slightly worsened. 2. Small bilateral pleural effusions, likely stable. 3. Stable bibasilar lung opacities, likely atelectasis and edema.   Electronically Signed    By: Ilona Sorrel M.D.   On: 01/25/2015 15:31     Latest Echo  Latest Cath   Medications:     Scheduled Medications: . allopurinol  400 mg Oral Daily  . amoxicillin-clavulanate  1 tablet Oral BID  . anastrozole  1 mg Oral Daily  . antiseptic oral rinse  7 mL Mouth Rinse q12n4p  . aspirin  325 mg Oral Daily  . atorvastatin  20 mg Oral q morning - 10a  . carvedilol  3.125 mg Oral BID WC  . chlorhexidine  15 mL Mouth Rinse BID  . furosemide  60 mg Intravenous Q12H  . insulin aspart  0-15 Units Subcutaneous TID WC & HS  . magnesium sulfate 1 - 4 g bolus IVPB  2 g Intravenous Once  . pantoprazole  40 mg Oral Daily  . sodium chloride  3 mL Intravenous Q12H  . sodium polystyrene  30 g Oral Once  . venlafaxine  50 mg Oral BID WC     Infusions: . [START ON 01/27/2015] sodium chloride       PRN Medications:  sodium chloride, sodium chloride, diazepam, diphenhydrAMINE, fentaNYL (SUBLIMAZE) injection, LORazepam, ondansetron, sodium chloride, sodium chloride, traMADol, zolpidem   Assessment/Plan    1. Acute systolic CHF : EF 99991111, decreased from normal in 1/15 2. AKI on CKD, Stage 3 - has improved greatly since admission. Baseline appears to be 1.4-1.6. 3. Hx of Breast Cancer - 3 doses of Herceptin in 12 /14 4. DM2 x 2 years 5. HTN 6. HLD 7. Heavy snoring - probable OSA  L/RHC today to assess cardiomyopathy.  With new EKG changes and decreased EF, ischemic CM considered most likely.  Will initiate ACE after cath.  Will need outpatient sleep study for OSA.   Length of Stay: 7   Shirley Friar PA-C 01/26/2015, 8:35 AM  Advanced Heart Failure Team Pager [phone] (M-F; 7a - 4p)  Please contact [facility] for night-coverage after hours (4p -7a ) and weekends on [email or web address]   Patient seen and examined with Oda Kilts, PA-C. We discussed all aspects of the encounter. I agree with the assessment and plan as stated above.   No significant HF symptoms. For cath  today. Will need outpatient sleep study.   Bensimhon, Daniel,MD 10:09 AM

## 2015-01-26 NOTE — Care Management Important Message (Signed)
Important Message  Patient Details  Name: Rebecca Hall MRN: [id number] Date of Birth: 26-Jan-1932   Medicare Important Message Given:  Yes-fourth notification given    Nathen May 01/26/2015, 12:30 PMImportant Message  Patient Details  Name: Rebecca Hall MRN: [id number] Date of Birth: 10-10-1931   Medicare Important Message Given:  Yes-fourth notification given    Nathen May 01/26/2015, 12:30 PM

## 2015-01-26 NOTE — Interval H&P Note (Signed)
History and Physical Interval Note:  01/26/2015 1:56 PM  Rebecca Hall  has presented today for surgery, with the diagnosis of Acute Systolic Heart Failure  The various methods of treatment have been discussed with the patient and family. After consideration of risks, benefits and other options for treatment, the patient has consented to  Procedure(s): Right/Left Heart Cath and Coronary Angiography (N/A) and possible angioplasty as a surgical intervention .  The patient's history has been reviewed, patient examined, no change in status, stable for surgery.  I have reviewed the patient's chart and labs.  Questions were answered to the patient's satisfaction.     Roylee Chaffin, Quillian Quince

## 2015-01-26 NOTE — Progress Notes (Signed)
Site area: right groin a 5 french arterial and an 7 french venous sheath was removed by Iona Beard RN  Site Prior to Removal:  Level 0  Pressure Applied For 20 MINUTES    Minutes Beginning at 1505p  Manual:   Yes.    Patient Status During Pull:  stable  Post Pull Groin Site:  Level 0  Post Pull Instructions Given:  Yes.    Post Pull Pulses Present:  Yes.    Dressing Applied:  Yes.    Comments:  VS remain stable during sheathh pull.  Pt resting with eyes closed, no discomfort noted at site at this time.

## 2015-01-26 NOTE — Progress Notes (Signed)
Advanced Heart Failure Rounding Note  Referring Physician: [place] Pink Primary Physician: Felipa Eth Primary Cardiologist: New  Subjective:    Has no complaints, but is a little apprehensive about cath.  Says she watched a video last night and has no further questions. Cath to be performed today. Pt discussed risks and benefits with Dr Haroldine Laws yesterday.     Objective:   Weight Range: 167 lb 14.4 oz (76.159 kg) Body mass index is 30.7 kg/(m^2).   Vital Signs:   Temp:  [98.4 F (36.9 C)] 98.4 F (36.9 C) (08/04 0604) Pulse Rate:  [73-109] 78 (08/04 0621) Resp:  [18] 18 (08/04 0604) BP: (149-160)/(61-80) 160/80 mmHg (08/04 0604) SpO2:  [84 %-98 %] 96 % (08/04 0621) Weight:  [167 lb 14.4 oz (76.159 kg)] 167 lb 14.4 oz (76.159 kg) (08/04 0621) Last BM Date: 01/23/15  Weight change: Filed Weights   01/24/15 0500 01/25/15 0500 01/26/15 0621  Weight: 169 lb 15.6 oz (77.1 kg) 177 lb (80.287 kg) 167 lb 14.4 oz (76.159 kg)    Intake/Output:   Intake/Output Summary (Last 24 hours) at 01/26/15 0835 Last data filed at 01/26/15 0735  Gross per 24 hour  Intake     30 ml  Output   3390 ml  Net  -3360 ml     Physical Exam: General: Well appearing. No resp difficulty HEENT: normal Neck: supple. JVP 8-9. Carotids 2+ bilat; no bruits. No lymphadenopathy or thryomegaly appreciated. Cor: PMI nondisplaced. Regular rate & rhythm. No rubs, gallops or murmurs appreciated. Lungs: clear Abdomen: Obese, soft, nontender, mildly distended. No hepatosplenomegaly. No bruits or masses. Good bowel sounds. Extremities: no cyanosis, clubbing, rash. trace edema. Neuro: alert & orientedx3, cranial nerves grossly intact. moves all 4 extremities w/o difficulty. Affect pleasant  Telemetry: NSR 70s. Tele sometimes called afib, but p waves present.  Lots of artifact  Labs: CBC  Recent Labs  01/25/15 1333 01/26/15 0508  WBC 4.8 4.4  HGB 8.3* 7.8*  HCT 26.1* 24.3*  MCV 100.0 98.4  PLT 94* 110*    Basic Metabolic Panel  Recent Labs  01/24/15 0450 01/25/15 1333 01/26/15 0508  NA 137 137 137  K 5.0 4.5 3.9  CL 100* 98* 99*  CO2 28 26 27   GLUCOSE 104* 163* 114*  BUN 14 14 14   CALCIUM 8.2* 8.8* 8.7*  MG 1.7  --   --   PHOS 3.4  --   --    Liver Function Tests No results for input(s): AST, ALT, ALKPHOS, BILITOT, PROT, ALBUMIN in the last 72 hours. No results for input(s): LIPASE, AMYLASE in the last 72 hours. Cardiac Enzymes No results for input(s): CKTOTAL, CKMB, CKMBINDEX, TROPONINI in the last 72 hours.  BNP: BNP (last 3 results) No results for input(s): BNP in the last 8760 hours.  ProBNP (last 3 results) No results for input(s): PROBNP in the last 8760 hours.   D-Dimer No results for input(s): DDIMER in the last 72 hours. Hemoglobin A1C  Recent Labs  01/24/15 0450  HGBA1C 7.8*   Fasting Lipid Panel No results for input(s): CHOL, HDL, LDLCALC, TRIG, CHOLHDL, LDLDIRECT in the last 72 hours. Thyroid Function Tests No results for input(s): TSH, T4TOTAL, T3FREE, THYROIDAB in the last 72 hours.  Invalid input(s): FREET3  Other results:     Imaging/Studies:  Dg Chest 2 View  01/25/2015   CLINICAL DATA:  Dyspnea. Breast cancer. Chronic kidney disease. Hypertension.  EXAM: CHEST  2 VIEW  COMPARISON:  01/22/2015 chest radiograph  FINDINGS: Left internal  jugular central venous catheter terminates at the cavoatrial junction. Stable cardiomediastinal silhouette with mild cardiomegaly. No pneumothorax. Small bilateral pleural effusions, likely stable. Mild pulmonary edema, slightly worsened. Bibasilar lung opacities, not appreciably changed, likely atelectasis and edema. Surgical clips in the right breast. Moderate degenerative changes in the thoracic spine.  IMPRESSION: 1. Mild congestive heart failure, slightly worsened. 2. Small bilateral pleural effusions, likely stable. 3. Stable bibasilar lung opacities, likely atelectasis and edema.   Electronically Signed    By: Ilona Sorrel M.D.   On: 01/25/2015 15:31     Latest Echo  Latest Cath   Medications:     Scheduled Medications: . allopurinol  400 mg Oral Daily  . amoxicillin-clavulanate  1 tablet Oral BID  . anastrozole  1 mg Oral Daily  . antiseptic oral rinse  7 mL Mouth Rinse q12n4p  . aspirin  325 mg Oral Daily  . atorvastatin  20 mg Oral q morning - 10a  . carvedilol  3.125 mg Oral BID WC  . chlorhexidine  15 mL Mouth Rinse BID  . furosemide  60 mg Intravenous Q12H  . insulin aspart  0-15 Units Subcutaneous TID WC & HS  . magnesium sulfate 1 - 4 g bolus IVPB  2 g Intravenous Once  . pantoprazole  40 mg Oral Daily  . sodium chloride  3 mL Intravenous Q12H  . sodium polystyrene  30 g Oral Once  . venlafaxine  50 mg Oral BID WC     Infusions: . [START ON 01/27/2015] sodium chloride       PRN Medications:  sodium chloride, sodium chloride, diazepam, diphenhydrAMINE, fentaNYL (SUBLIMAZE) injection, LORazepam, ondansetron, sodium chloride, sodium chloride, traMADol, zolpidem   Assessment/Plan    1. Acute systolic CHF : EF 99991111, decreased from normal in 1/15 2. AKI on CKD, Stage 3 - has improved greatly since admission. Baseline appears to be 1.4-1.6. 3. Hx of Breast Cancer - 3 doses of Herceptin in 12 /14 4. DM2 x 2 years 5. HTN 6. HLD 7. Heavy snoring - probable OSA  L/RHC today to assess cardiomyopathy.  With new EKG changes and decreased EF, ischemic CM considered most likely.  Will initiate ACE after cath.  Will need outpatient sleep study for OSA.   Length of Stay: 7   Shirley Friar PA-C 01/26/2015, 8:35 AM  Advanced Heart Failure Team Pager [phone] (M-F; 7a - 4p)  Please contact [facility] for night-coverage after hours (4p -7a ) and weekends on [email or web address]   Patient seen and examined with Oda Kilts, PA-C. We discussed all aspects of the encounter. I agree with the assessment and plan as stated above.   No significant HF symptoms. For cath  today. Will need outpatient sleep study.   Pragya Lofaso,MD 10:09 AM

## 2015-01-26 NOTE — Progress Notes (Signed)
PROGRESS NOTE  Rebecca Hall [id number] DOB: 20-Jun-1932 DOA: 01/19/2015 PCP: Mathews Argyle, MD  Brief history of present illness 81 year old Caucasian female with history of breast cancer presented with hypovolemic shock. She required pressors initially. She was volume resuscitated. She was transferred to the floor. Developed fluid overload and pulmonary edema. Started on IV Lasix. Echocardiogram showed EF of 30%, which is new compared to last year. Cardiology has been consulted. Plan is for catheterization tomorrow. On IV Lasix.  Assessment/Plan:  Acute hypoxic respiratory failure/acute on chronic diastolic CHF exacerbation/new acute systolic CHF -due to volume resuscitation, patient developed pulmonary edema.  Initially had high oxygen requirements.  -Chest x-ray= pulmonary edema. Patient was started on IV Lasix. She had good response. Continues to be short of breath.   -Echocardiogram reveals EF of 30%. This is new compared to echocardiogram from last year. Discussed with her oncologist, Dr. Jana Hakim. He does not feel that any of the medications being used for her breast cancer is contributing.  -appreciate Dr. Haroldine Laws -01/26/15 cath-->1 vessel disease, 60% prox LAD -continue IV lasix and carvedilol  Hypovolemic shock -Now resolved.  -secondary to volume depletion from nausea, vomiting and diarrhea.  -On admission patient was placed on pressors as she was hypotensive despite 4 L of IV fluids. Patient was taken off of pressors. Blood pressures improved. Patient has been pancultured.  -Blood cultures negative so far.  -Procalcitonin was less than 0.10. Lactic acid level on 01/19/2015 was 0.56. -Patient was initially on broad-spectrum antibiotics. Then changed over to Augmentin. Completed total of 7 days of treatment. Last day of abx 8/4 -Carvedilol was restarted 01/25/2015  Acute on chronic kidney disease stage III -baseline creatinine 1.1-1.4 -presented with a  creatinine of 4.78 and a BUN of 99.  -With hydration renal function has improved.  -Monitor renal function closely while she is getting Lasix and post cardiac cath. -am BMP  Electrolyte abnormalities/hyperkalemia/hypomagnesemia/hyponatremia Hyperkalemia secondary to acute renal failure. Hyperkalemia status post Kayexalate, calcium gluconate.  -Repeat K improved.  -Hypomagnesemia and hyponatremia likely secondary to GI losses. Hyponatremia secondary to hypovolemia which has resolved with hydration. Follow replete electrolytes as needed.  Nausea vomiting diarrhea Improved. She is tolerating her diet.   Nonanion gap acidosis Secondary to nausea vomiting diarrhea. Resolved with bicarbonate drip.  Diabetes mellitus, type II with renal complications Hemoglobin A1c = 7.8. Patient does not like the food here. CBGs have ranged from 97 -155. Continue heart healthy diet. Continue sliding scale insulin.  Gastroesophageal reflux disease/IBS PPI.  Hx breast cancer status post right lumpectomy Followed by Dr. Jana Hakim. Continue anastrozole.  Back pain Pain management.  Pancytopenia Likely secondary to anastrozole. Platelet count trending back up. Off heparin.  Hot flashes Likely secondary to anastrozole.  Prophylaxis PPI for GI prophylaxis. SCDs for DVT prophylaxis.  Code Status: Full Family Communication: Updated patient. No family at bedside. Disposition Plan:  HOME 8/5 if cleared by cardiology      Consultants:  PCCM admit  Procedures: CXR 7/28 >>> CM with interstitial edema. CT A/P 7/28 >>> no acute process. CXR 7/29 >>> left IJ in place Left IJ 01/20/2015  2D ECHO Study Conclusions - Left ventricle: The cavity size was normal. Wall thickness wasincreased in a pattern of mild LVH. Systolic function wasmoderately to severely reduced. The estimated ejection fractionwas in the range of 30% to 35%. Akinesis of themid-apicalanteroseptal myocardium. Features are consistent  with apseudonormal left ventricular filling pattern, with concomitantabnormal relaxation and increased  filling pressure (grade 2diastolic dysfunction). - Mitral valve: There was mild regurgitation. - Right ventricle: The cavity size was mildly dilated. Systolicfunction was mildly reduced.  Antibiotics: Vanc, start date 7/28>>>7/29 Abx: Zosyn, start date 7/28>>>01/23/15 augmentin 01/23/15        Procedures/Studies: Ct Abdomen Pelvis Wo Contrast  01/19/2015   CLINICAL DATA:  Nausea, vomiting, diarrhea starting Sunday.  EXAM: CT ABDOMEN AND PELVIS WITHOUT CONTRAST  TECHNIQUE: Multidetector CT imaging of the abdomen and pelvis was performed following the standard protocol without IV contrast.  COMPARISON:  None.  FINDINGS: There is no nephrolithiasis or hydroureteronephrosis bilaterally. The kidneys are normal. The liver, spleen, pancreas are normal. The adrenal glands are normal. There is a gallstone in the gallbladder without perigallbladder inflammatory change. There is atherosclerosis of the abdominal aorta without aneurysmal dilatation. There is no abdominal lymphadenopathy.  There is a hiatal hernia. There is no small bowel obstruction or diverticulitis. The appendix is not seen but no inflammation is noted around the cecum to suggest appendicitis.  Partial fluid-filled bladder is normal. Patient is status post prior hysterectomy. Minimal free fluid is identified in the pelvis.  There are small bilateral pleural effusions with atelectasis of the posterior lungs. Degenerative joint changes of the spine are noted.  IMPRESSION: No acute abnormality identified in the abdomen and pelvis. There is no bowel obstruction or diverticulitis.   Electronically Signed   By: Abelardo Diesel M.D.   On: 01/19/2015 19:07   Dg Chest 2 View  01/25/2015   CLINICAL DATA:  Dyspnea. Breast cancer. Chronic kidney disease. Hypertension.  EXAM: CHEST  2 VIEW  COMPARISON:  01/22/2015 chest radiograph  FINDINGS: Left  internal jugular central venous catheter terminates at the cavoatrial junction. Stable cardiomediastinal silhouette with mild cardiomegaly. No pneumothorax. Small bilateral pleural effusions, likely stable. Mild pulmonary edema, slightly worsened. Bibasilar lung opacities, not appreciably changed, likely atelectasis and edema. Surgical clips in the right breast. Moderate degenerative changes in the thoracic spine.  IMPRESSION: 1. Mild congestive heart failure, slightly worsened. 2. Small bilateral pleural effusions, likely stable. 3. Stable bibasilar lung opacities, likely atelectasis and edema.   Electronically Signed   By: Ilona Sorrel M.D.   On: 01/25/2015 15:31   Dg Chest Port 1 View  01/22/2015   CLINICAL DATA:  hypotension.  Nausea, vomiting, diarrhea.  EXAM: PORTABLE CHEST - 1 VIEW  COMPARISON:  01/20/2015  FINDINGS: Left central line remains in place, unchanged. There is mild cardiomegaly and vascular congestion. Increasing bibasilar atelectasis or infiltrates. No visible effusions or acute bony abnormality.  IMPRESSION: Mild cardiomegaly and vascular congestion.  Increasing bibasilar atelectasis or infiltrates.   Electronically Signed   By: Rolm Baptise M.D.   On: 01/22/2015 12:43   Dg Chest Port 1 View  01/20/2015   CLINICAL DATA:  Central line placement.  Hypertension.  Diabetes.  EXAM: PORTABLE CHEST - 1 VIEW  COMPARISON:  01/19/2015  FINDINGS: There is a new left jugular central line with tip in the low SVC. There is no pneumothorax. Persistent interstitial thickening without significant interval change. No confluent consolidation.  IMPRESSION: New left jugular central line.  No pneumothorax.   Electronically Signed   By: Andreas Newport M.D.   On: 01/20/2015 01:22   Dg Chest Portable 1 View  01/19/2015   CLINICAL DATA:  Shortness of breath and emesis  EXAM: PORTABLE CHEST - 1 VIEW  COMPARISON:  Oct 31, 2009  FINDINGS: The heart size and mediastinal contours are stable. The heart size is  enlarged. The aorta is tortuous. There is diffuse bilateral increased pulmonary interstitium. There is no focal pneumonia or pleural effusion. The visualized skeletal structures are stable.  IMPRESSION: Cardiomegaly.  Interstitial edema.   Electronically Signed   By: Abelardo Diesel M.D.   On: 01/19/2015 20:34         Subjective: Patient denies fevers, chills, headache, chest pain, dyspnea, nausea, vomiting, diarrhea, abdominal pain, dysuria, hematuria   Objective: Filed Vitals:   01/26/15 1505 01/26/15 1510 01/26/15 1515 01/26/15 1520  BP: 150/54 140/51 150/64 157/58  Pulse: 67 69 69 66  Temp:      TempSrc:      Resp: 16 16 15 19   Height:      Weight:      SpO2: 96% 97% 96% 98%    Intake/Output Summary (Last 24 hours) at 01/26/15 1603 Last data filed at 01/26/15 1601  Gross per 24 hour  Intake    270 ml  Output   3891 ml  Net  -3621 ml   Weight change: -4.128 kg (-9 lb 1.6 oz) Exam:   General:  Pt is alert, follows commands appropriately, not in acute distress  HEENT: No icterus, No thrush, No neck mass, [place]/AT  Cardiovascular: RRR, S1/S2, no rubs, no gallops  Respiratory: CTAB, no wheeze  Abdomen: Soft/+BS, non tender, non distended, no guarding  Extremities: 1+LE edema, No lymphangitis, No petechiae, No rashes, no synovitis  Data Reviewed: Basic Metabolic Panel:  Recent Labs Lab 01/20/15 0450  01/22/15 0435 01/23/15 0342 01/24/15 0450 01/25/15 1333 01/26/15 0508  NA 128*  < > 143 139 137 137 137  K 5.3*  < > 4.5 5.3* 5.0 4.5 3.9  CL 105  < > 102 105 100* 98* 99*  CO2 13*  < > 31 28 28 26 27   GLUCOSE 295*  < > 117* 124* 104* 163* 114*  BUN 72*  < > 27* 14 14 14 14   CREATININE 3.37*  < > 1.86* 1.61* 1.67* 1.51* 1.43*  CALCIUM 5.9*  < > 7.1* 7.8* 8.2* 8.8* 8.7*  MG 1.0*  --  1.0* 1.7 1.7  --   --   PHOS 5.9*  --  2.4*  --  3.4  --   --   < > = values in this interval not displayed. Liver Function Tests:  Recent Labs Lab 01/19/15 1700  AST 23    ALT 21  ALKPHOS 53  BILITOT 0.3  PROT 5.8*  ALBUMIN 3.3*   No results for input(s): LIPASE, AMYLASE in the last 168 hours. No results for input(s): AMMONIA in the last 168 hours. CBC:  Recent Labs Lab 01/19/15 1700  01/22/15 0435 01/23/15 0342 01/24/15 0450 01/25/15 1333 01/26/15 0508  WBC 6.1  < > 3.6* 5.6 4.1 4.8 4.4  NEUTROABS 5.1  --   --   --   --   --   --   HGB 9.3*  < > 8.0* 8.5* 7.6* 8.3* 7.8*  HCT 27.6*  < > 24.1* 26.0* 23.8* 26.1* 24.3*  MCV 93.9  < > 97.2 98.1 97.5 100.0 98.4  PLT 94*  < > 78* 81* 83* 94* 110*  < > = values in this interval not displayed. Cardiac Enzymes: No results for input(s): CKTOTAL, CKMB, CKMBINDEX, TROPONINI in the last 168 hours. BNP: Invalid input(s): POCBNP CBG:  Recent Labs Lab 01/25/15 2203 01/26/15 0745 01/26/15 1140 01/26/15 1305 01/26/15 1501  GLUCAP 108* 118* 143* 120* 94    Recent Results (from  the past 240 hour(s))  Blood culture (routine x 2)     Status: None   Collection Time: 01/19/15  5:13 PM  Result Value Ref Range Status   Specimen Description BLOOD RIGHT HAND  Final   Special Requests BOTTLES DRAWN AEROBIC AND ANAEROBIC 5CC  Final   Culture NO GROWTH 5 DAYS  Final   Report Status 01/24/2015 FINAL  Final  Blood culture (routine x 2)     Status: None   Collection Time: 01/19/15  5:25 PM  Result Value Ref Range Status   Specimen Description BLOOD RIGHT FOREARM  Final   Special Requests BOTTLES DRAWN AEROBIC AND ANAEROBIC 5ML  Final   Culture NO GROWTH 5 DAYS  Final   Report Status 01/24/2015 FINAL  Final  MRSA PCR Screening     Status: None   Collection Time: 01/19/15 10:21 PM  Result Value Ref Range Status   MRSA by PCR NEGATIVE NEGATIVE Final    Comment:        The GeneXpert MRSA Assay (FDA approved for NASAL specimens only), is one component of a comprehensive MRSA colonization surveillance program. It is not intended to diagnose MRSA infection nor to guide or monitor treatment for MRSA  infections.   Urine culture     Status: None   Collection Time: 01/20/15  5:49 AM  Result Value Ref Range Status   Specimen Description URINE, CLEAN CATCH  Final   Special Requests NONE  Final   Culture NO GROWTH 1 DAY  Final   Report Status 01/21/2015 FINAL  Final  Surgical pcr screen     Status: None   Collection Time: 01/26/15  1:06 PM  Result Value Ref Range Status   MRSA, PCR NEGATIVE NEGATIVE Final   Staphylococcus aureus NEGATIVE NEGATIVE Final    Comment:        The Xpert SA Assay (FDA approved for NASAL specimens in patients over 78 years of age), is one component of a comprehensive surveillance program.  Test performance has been validated by [facility] for patients greater than or equal to 70 year old. It is not intended to diagnose infection nor to guide or monitor treatment.      Scheduled Meds: . allopurinol  400 mg Oral Daily  . amoxicillin-clavulanate  1 tablet Oral BID  . anastrozole  1 mg Oral Daily  . antiseptic oral rinse  7 mL Mouth Rinse q12n4p  . aspirin  325 mg Oral Daily  . atorvastatin  20 mg Oral q morning - 10a  . carvedilol  3.125 mg Oral BID WC  . chlorhexidine  15 mL Mouth Rinse BID  . furosemide  60 mg Intravenous Q12H  . heparin  5,000 Units Subcutaneous 3 times per day  . insulin aspart  0-15 Units Subcutaneous TID WC & HS  . magnesium sulfate 1 - 4 g bolus IVPB  2 g Intravenous Once  . pantoprazole  40 mg Oral Daily  . sodium chloride  3 mL Intravenous Q12H  . sodium polystyrene  30 g Oral Once  . venlafaxine  50 mg Oral BID WC   Continuous Infusions: . sodium chloride       Alverto Shedd, DO  [facility] Pager [phone]  If 7PM-7AM, please contact night-coverage [email or web address] Password [email or web address] 01/26/2015, 4:03 PM   LOS: 7 days

## 2015-01-27 DIAGNOSIS — E1129 Type 2 diabetes mellitus with other diabetic kidney complication: Secondary | ICD-10-CM

## 2015-01-27 DIAGNOSIS — I5023 Acute on chronic systolic (congestive) heart failure: Secondary | ICD-10-CM

## 2015-01-27 DIAGNOSIS — I5021 Acute systolic (congestive) heart failure: Secondary | ICD-10-CM

## 2015-01-27 LAB — GLUCOSE, CAPILLARY
Glucose-Capillary: 109 mg/dL — ABNORMAL HIGH (ref 65–99)
Glucose-Capillary: 159 mg/dL — ABNORMAL HIGH (ref 65–99)
Glucose-Capillary: 178 mg/dL — ABNORMAL HIGH (ref 65–99)

## 2015-01-27 LAB — BASIC METABOLIC PANEL
Anion gap: 13 (ref 5–15)
BUN: 15 mg/dL (ref 6–20)
CO2: 32 mmol/L (ref 22–32)
Calcium: 8.6 mg/dL — ABNORMAL LOW (ref 8.9–10.3)
Chloride: 94 mmol/L — ABNORMAL LOW (ref 101–111)
Creatinine, Ser: 1.37 mg/dL — ABNORMAL HIGH (ref 0.44–1.00)
GFR calc Af Amer: 41 mL/min — ABNORMAL LOW (ref >60)
GFR calc non Af Amer: 35 mL/min — ABNORMAL LOW (ref >60)
Glucose, Bld: 110 mg/dL — ABNORMAL HIGH (ref 65–99)
Potassium: 3.2 mmol/L — ABNORMAL LOW (ref 3.5–5.1)
Sodium: 139 mmol/L (ref 135–145)

## 2015-01-27 LAB — MAGNESIUM: Magnesium: 1.5 mg/dL — ABNORMAL LOW (ref 1.7–2.4)

## 2015-01-27 MED ORDER — FUROSEMIDE 20 MG PO TABS: 20.0000 mg | ORAL_TABLET | Freq: Every evening | ORAL | Status: DC

## 2015-01-27 MED ORDER — AMOXICILLIN-POT CLAVULANATE 500-125 MG PO TABS
1.0000 | ORAL_TABLET | Freq: Once | ORAL | Status: AC
  Administered 2015-01-27: 500 mg via ORAL
  Filled 2015-01-27: qty 1

## 2015-01-27 MED ORDER — POTASSIUM CHLORIDE CRYS ER 20 MEQ PO TBCR: 20.0000 meq | EXTENDED_RELEASE_TABLET | Freq: Two times a day (BID) | ORAL | Status: DC

## 2015-01-27 MED ORDER — CARVEDILOL 3.125 MG PO TABS: 3.1250 mg | ORAL_TABLET | Freq: Two times a day (BID) | ORAL | Status: DC

## 2015-01-27 MED ORDER — SACUBITRIL-VALSARTAN 24-26 MG PO TABS: 1.0000 | ORAL_TABLET | Freq: Two times a day (BID) | ORAL | Status: DC

## 2015-01-27 MED ORDER — MAGNESIUM SULFATE 2 GM/50ML IV SOLN
2.0000 g | Freq: Once | INTRAVENOUS | Status: AC
  Administered 2015-01-27: 2 g via INTRAVENOUS
  Filled 2015-01-27: qty 50

## 2015-01-27 MED ORDER — POTASSIUM CHLORIDE CRYS ER 20 MEQ PO TBCR
40.0000 meq | EXTENDED_RELEASE_TABLET | Freq: Once | ORAL | Status: DC
  Filled 2015-01-27: qty 2

## 2015-01-27 MED ORDER — FUROSEMIDE 40 MG PO TABS
40.0000 mg | ORAL_TABLET | Freq: Every day | ORAL | Status: DC
Start: 2015-01-28 — End: 2015-01-27

## 2015-01-27 MED ORDER — FUROSEMIDE 40 MG PO TABS: 40.0000 mg | ORAL_TABLET | Freq: Every day | ORAL | Status: DC

## 2015-01-27 MED ORDER — SACUBITRIL-VALSARTAN 24-26 MG PO TABS
1.0000 | ORAL_TABLET | Freq: Two times a day (BID) | ORAL | Status: DC
  Administered 2015-01-27: 1 via ORAL
  Filled 2015-01-27 (×2): qty 1

## 2015-01-27 MED ORDER — POTASSIUM CHLORIDE CRYS ER 10 MEQ PO TBCR
40.0000 meq | EXTENDED_RELEASE_TABLET | Freq: Once | ORAL | Status: AC
Start: 2015-01-27 — End: 2015-01-27
  Administered 2015-01-27: 40 meq via ORAL
  Filled 2015-01-27: qty 4

## 2015-01-27 MED FILL — Heparin Sodium (Porcine) 2 Unit/ML in Sodium Chloride 0.9%: INTRAMUSCULAR | Qty: 1500 | Status: AC

## 2015-01-27 NOTE — Progress Notes (Signed)
Occupational Therapy Treatment Patient Details Name: Rebecca Hall MRN: [id number] DOB: September 14, 1931 Today's Date: 01/27/2015    History of present illness 81 y.o. F brought to [facility] ED with hypotension, N/V/D. In ED, remained hypotensive despite 4L IVF; therefore, started on neosynephrine peripherally. Cardiac Cath 01/27/15.   OT comments  This 81 yo female admitted with above presents to acute OT not really making progress today from eval due to dizziness (see precautions section below) and weakness. Increased time with session today due this dizziness and weakness. PTA pt was not using a RW or O2--now needs both and managing O2 with RW is not an easy task especially when you are weak. Pt will continue to benefit from acute OT with follow up recommendation now changed to continued OT at SNF to get pt back to her PLOF.--pt aware she needs more therapy before she could go home safely. RN reported that he has made SW aware. We will continue to follow.  Follow Up Recommendations  SNF    Equipment Recommendations  None recommended by OT       Precautions / Restrictions Precautions Precautions: Fall Precaution Comments: watch sats. Sitting 120/38 with pt stating she needed to lay down; when she laid down Bp 127/49 with pt statig she was feeling better. Took it one more time while she was laying (3 minute span between) 116/49--(nursing in room right after this). Had pt sit up and she did not c/o of dizziness this time--ambulated to sink to brush hair and pt stated she needed to sit (BP 97/49) with pt not so much noting dizzines than just weakness. Pt sat in chair for 5 minutes and then checked BP was 120/43. Restrictions Weight Bearing Restrictions: No       Mobility Bed Mobility Overal bed mobility: Modified Independent (HOB up and use of rail)                Transfers Overall transfer level: Needs assistance Equipment used: Rolling walker (2 wheeled) Transfers: Sit to/from Stand Sit to  Stand: Min guard         General transfer comment: VCs for safe hand placement    Balance Overall balance assessment: Needs assistance;History of Falls Sitting-balance support: No upper extremity supported;Feet supported Sitting balance-Leahy Scale: Fair (due to dizziness)     Standing balance support: Bilateral upper extremity supported;During functional activity Standing balance-Leahy Scale: Fair                     ADL Overall ADL's : Needs assistance/impaired     Grooming: Oral care;Brushing hair;Set up;Supervision/safety;Sitting Grooming Details (indicate cue type and reason): could not complete in standing due to weakness and drop in BP                 Toilet Transfer: Ambulation;RW;BSC;Min guard   Toileting- Water quality scientist and Hygiene: Min guard;Sit to/from stand                          Cognition   Behavior During Therapy: [facility] for tasks assessed/performed Overall Cognitive Status: Within Functional Limits for tasks assessed                                    Pertinent Vitals/ Pain       Pain Assessment: No/denies pain         Frequency Min 2X/week  Progress Toward Goals  OT Goals(current goals can now be found in the care plan section)  Progress towards OT goals: Not progressing toward goals - comment (due to dizziness and weakness)     Plan Discharge plan needs to be updated       End of Session Equipment Utilized During Treatment: Rolling walker;Oxygen (2 liters)   Activity Tolerance  (limited by dizziness and weakness)   Patient Left in chair;with call bell/phone within reach   Nurse Communication  (BPs)        Time: [id number] OT Time Calculation (min): 65 min  Charges: OT General Charges $OT Visit: 1 Procedure OT Treatments $Self Care/Home Management : 53-67 mins  Almon Register [id number] 01/27/2015, 11:53 AM

## 2015-01-27 NOTE — Progress Notes (Signed)
Physical Therapy Treatment Patient Details Name: Rebecca Hall MRN: [id number] DOB: [birth year] Today's Date: 01/27/2015    History of Present Illness 81 y.o. F brought to [facility] ED with hypotension, N/V/D. In ED, remained hypotensive despite 4L IVF; therefore, started on neosynephrine peripherally. Cardiac Cath 01/27/15.Pt dx with acute hypoxic respiratory failure/acute on chronic diastolic CHF exacerbation and new acute systolic CHF.  hypovolemic shock, acute on chronic kidney disease (III), and electrolyte abnormalities.      PT Comments    Pt is weak and deconditioned.  She desats on RA at rest and will likely need home O2.  She is unable to walk >20' without a seated rest break.  She is open to going to SNF for rehab, but only if she can go to [facility], otherwise she wants to go home with her husband and max HH services.  PT will continue to follow acutely.   Follow Up Recommendations  SNF ([place] or she wants to go home)     Equipment Recommendations  None recommended by PT    Recommendations for Other Services   NA     Precautions / Restrictions Precautions Precautions: Fall;Other (comment) Precaution Comments: monitor O2 sats Restrictions Weight Bearing Restrictions: No    Mobility  Bed Mobility Overal bed mobility: Modified Independent (HOB up and use of rail)                Transfers Overall transfer level: Needs assistance Equipment used: Rolling walker (2 wheeled) Transfers: Sit to/from Stand Sit to Stand: Min assist;Min guard         General transfer comment: Verbalc cues for safe hand placement min assist from lower surfaces, min guard from higher surfaces.   Ambulation/Gait Ambulation/Gait assistance: Min guard Ambulation Distance (Feet): 40 Feet (20'x2) Assistive device: Rolling walker (2 wheeled) Gait Pattern/deviations: Step-through pattern;Shuffle Gait velocity: decreased Gait velocity interpretation: Below normal speed for age/gender General  Gait Details: slow, shuffling gait pattern, with weak, buckling knees.           Balance Overall balance assessment: Needs assistance Sitting-balance support: No upper extremity supported;Feet supported Sitting balance-Leahy Scale: Fair (due to dizziness)     Standing balance support: Bilateral upper extremity supported;No upper extremity supported;Single extremity supported Standing balance-Leahy Scale: Fair                      Cognition Arousal/Alertness: Awake/alert Behavior During Therapy: WFL for tasks assessed/performed Overall Cognitive Status: Within Functional Limits for tasks assessed                         General Comments General comments (skin integrity, edema, etc.): Pt's O2 sats were 87% on RA at rest, applied 2 L O2 Bennett for gait.       Pertinent Vitals/Pain Pain Assessment: No/denies pain           PT Goals (current goals can now be found in the care plan section) Acute Rehab PT Goals Patient Stated Goal: If she can't get into [facility] SNF then to go home.  Progress towards PT goals: Progressing toward goals    Frequency  Min 3X/week    PT Plan Discharge plan needs to be updated       End of Session Equipment Utilized During Treatment: Oxygen Activity Tolerance: Patient limited by fatigue;Treatment limited secondary to medical complications (Comment) (decreased O2 sats on RA) Patient left: in chair;with call bell/phone within reach;with family/visitor present  Time: [id number] PT Time Calculation (min) (ACUTE ONLY): 20 min  Charges:  $Gait Training: 8-22 mins                      Sebastyan Snodgrass B. Jalen Oberry, PT, DPT [phone]   01/27/2015, 2:04 PM

## 2015-01-27 NOTE — Discharge Summary (Signed)
Physician Discharge Summary  Rebecca Hall [id number] DOB: [birth year] DOA: 01/19/2015  PCP: Mathews Argyle, MD  Admit date: 01/19/2015 Discharge date: 01/27/2015  Recommendations for Outpatient Follow-up:  1. Pt will need to follow up with PCP in 2 weeks post discharge 2. Please obtain BMP in one week  Discharge Diagnoses:  Acute hypoxic respiratory failure/acute on chronic diastolic CHF exacerbation/new acute systolic CHF -due to volume resuscitation, patient developed pulmonary edema.  Initially had high oxygen requirements.  -Chest x-ray= pulmonary edema. Patient was started on IV Lasix. She had good response. Continues to be short of breath.  -Echocardiogram reveals EF of 30%. This is new compared to echocardiogram from last year. Discussed with her oncologist, Dr. Jana Hakim. He does not feel that any of the medications being used for her breast cancer is contributing.  -appreciate Dr. Haroldine Laws -01/26/15 cath-->1 vessel disease, 60% prox LAD -continue IV lasix and carvedilol -At the time of discharge, the patient will go home with furosemide 40 mg po in the morning, 20 mg in the afternoon -She will be started on Entresto bid, and her lisinopril will be discontinued -She will continue on her present dose of carvedilol 3.125 mg twice a day -The patient will be sent home on oxygen, she had oxygen desaturation to 87% on room air improving to 98% with 2 L.  Hypovolemic shock -Now resolved.  -secondary to volume depletion from nausea, vomiting and diarrhea.  -On admission patient was placed on pressors as she was hypotensive despite 4 L of IV fluids. Patient was taken off of pressors. Blood pressures improved. Patient has been pancultured.  -Blood cultures negative  -Procalcitonin was less than 0.10. Lactic acid level on 01/19/2015 was 0.56. -Patient was initially on broad-spectrum antibiotics. Then changed over to Augmentin. Completed total of 7 days of treatment. Last day  of abx 8/4 -Carvedilol was restarted 01/25/2015 -The patient remained afebrile and hemodynamically stable off of antibiotics  Acute on chronic kidney disease stage III -baseline creatinine 1.1-1.4 -presented with a creatinine of 4.78 and a BUN of 99.  -With hydration renal function has improved.  -Monitor renal function closely while she is getting Lasix and post cardiac cath.  -Renal function returned to baseline with a serum creatinine of 1.37 on the day of discharge   Electrolyte abnormalities/hyperkalemia/hypomagnesemia/hyponatremia Hyperkalemia secondary to acute renal failure. Hyperkalemia status post Kayexalate, calcium gluconate.  -Repeat K improved.  -Hypomagnesemia and hyponatremia likely secondary to GI losses. Hyponatremia secondary to hypovolemia which has resolved with hydration. Follow replete electrolytes as needed.  Nausea vomiting diarrhea Improved. She is tolerating her diet.   Nonanion gap acidosis Secondary to nausea vomiting diarrhea. Resolved with bicarbonate drip.  Diabetes mellitus, type II with renal complications Hemoglobin A1c = 7.8. Patient does not like the food here. CBGs have ranged from 97 -155. Continue heart healthy diet. Continue sliding scale insulin.  Gastroesophageal reflux disease/IBS PPI.  Hx breast cancer status post right lumpectomy Followed by Dr. Jana Hakim. Continue anastrozole.  Back pain Pain management.  Pancytopenia Likely secondary to anastrozole. Platelet count trending back up. Off heparin.  Hot flashes Likely secondary to anastrozole.  Deconditioning -Evaluated by PT/OT whom recommended skilled nursing facility -Patient refused--she will go home with home health PT and OT  Discharge Condition: stable  Disposition: Home --pt refused SNF Follow-up Information    Follow up with Glori Bickers, MD On 02/09/2015.   Specialty:  Cardiology   Why:  at 2:30 pm for post hospital follow up.  Please bring all of  your  medications with you to your visit.  Pt parking code for heart and vascular center is [id number].   Contact information:   [place] [address] [phone]       Diet:heart healthy Wt Readings from Last 3 Encounters:  01/27/15 71.124 kg (156 lb 12.8 oz)  06/14/14 73.619 kg (162 lb 4.8 oz)  12/14/13 73.982 kg (163 lb 1.6 oz)    History of present illness:  81 year old Caucasian female with history of breast cancer presented with hypovolemic shock. She required pressors initially. She was volume resuscitated. She was transferred to the floor. Developed fluid overload and pulmonary edema. Started on IV Lasix. Echocardiogram showed EF of 30%, which is new compared to last year. Cardiology has been consulted. She was started On IV Lasix. The patient had a good clinical response.  Because of the patient's new echocardiographic findings, heart catheterization was performed and revealed one vessel disease in the proximal LAD of 60%. This was not thought to be the cause of her new cardiomyopathy. Therefore, it was thought that the patient had a new nonischemic cardiomyopathy. The patient's medications were adjusted as discussed above by cardiology. She has a follow-up appointment with cardiology on 02/09/2015 at 2:30 PM.  Consultants: Cardiology--Bensimhon  Discharge Exam: Filed Vitals:   01/27/15 1246  BP:   Pulse: 70  Temp:   Resp:    Filed Vitals:   01/27/15 0624 01/27/15 0800 01/27/15 1038 01/27/15 1246  BP:  152/62 120/38   Pulse:  77  70  Temp:      TempSrc:      Resp:  14    Height:      Weight:      SpO2: 96% 90% 94% 98%   General: A&O x 3, NAD, pleasant, cooperative Cardiovascular: RRR, no rub, no gallop, no S3 Respiratory: fine bibasilar crackles, left greater than right. No wheezing.  Abdomen:soft, nontender, nondistended, positive bowel sounds Extremities: No edema, No lymphangitis, no petechiae  Discharge Instructions  Discharge  Instructions    Diet - low sodium heart healthy    Complete by:  As directed      Increase activity slowly    Complete by:  As directed             Medication List    STOP taking these medications        lisinopril 20 MG tablet  Commonly known as:  PRINIVIL,ZESTRIL     methylPREDNIsolone 4 MG tablet  Commonly known as:  MEDROL DOSPACK      TAKE these medications        allopurinol 100 MG tablet  Commonly known as:  ZYLOPRIM  Take 100 mg by mouth daily. Total of 400mg  daily     anastrozole 1 MG tablet  Commonly known as:  ARIMIDEX  TAKE 1 TABLET (1 MG TOTAL) BY MOUTH DAILY.     aspirin 325 MG tablet  Take 325 mg by mouth daily.     atorvastatin 20 MG tablet  Commonly known as:  LIPITOR  Take 20 mg by mouth every morning.     calcium carbonate 600 MG Tabs tablet  Commonly known as:  OS-CAL  Take 600 mg by mouth 2 (two) times daily with a meal.     carvedilol 3.125 MG tablet  Commonly known as:  COREG  Take 1 tablet (3.125 mg total) by mouth 2 (two) times daily with a meal.     diazepam 5 MG tablet  Commonly known as:  VALIUM  Take 1 tablet (5 mg total) by mouth at bedtime as needed for anxiety.     diphenhydrAMINE 25 MG tablet  Commonly known as:  BENADRYL  Take 25 mg by mouth every 6 (six) hours as needed for allergies.     diphenhydramine-acetaminophen 25-500 MG Tabs  Commonly known as:  TYLENOL PM  Take 1 tablet by mouth at bedtime.     furosemide 20 MG tablet  Commonly known as:  LASIX  Take 1 tablet (20 mg total) by mouth every evening.  Start taking on:  01/28/2015     furosemide 40 MG tablet  Commonly known as:  LASIX  Take 1 tablet (40 mg total) by mouth daily with breakfast.  Start taking on:  01/28/2015     gabapentin 300 MG capsule  Commonly known as:  NEURONTIN  TAKE 1 CAPSULE BY MOUTH DAILY AT BEDTIME     glimepiride 2 MG tablet  Commonly known as:  AMARYL  Take 2 mg by mouth daily with breakfast.     LORazepam 0.5 MG tablet  Commonly  known as:  ATIVAN  Take 1 tablet (0.5 mg total) by mouth 2 (two) times daily as needed for anxiety.     minoxidil 2 % external solution  Commonly known as:  ROGAINE  Apply 1 application topically daily.     multivitamin with minerals Tabs tablet  Take 1 tablet by mouth 2 (two) times daily.     omeprazole 20 MG capsule  Commonly known as:  PRILOSEC  Take 20 mg by mouth daily.     potassium chloride SA 20 MEQ tablet  Commonly known as:  K-DUR,KLOR-CON  Take 1 tablet (20 mEq total) by mouth 2 (two) times daily.     sacubitril-valsartan 24-26 MG  Commonly known as:  ENTRESTO  Take 1 tablet by mouth 2 (two) times daily.     triamcinolone ointment 0.5 %  Commonly known as:  KENALOG  Apply 1 application topically 2 (two) times daily.     Vitamin D (Ergocalciferol) 50000 UNITS Caps capsule  Commonly known as:  DRISDOL  Take 50,000 Units by mouth every 14 (fourteen) days.     Zoledronic Acid 4 MG/100ML IVPB  Commonly known as:  ZOMETA  Inject 4 mg into the vein every 6 (six) months.         The results of significant diagnostics from this hospitalization (including imaging, microbiology, ancillary and laboratory) are listed below for reference.    Significant Diagnostic Studies: Ct Abdomen Pelvis Wo Contrast  01/19/2015   CLINICAL DATA:  Nausea, vomiting, diarrhea starting Sunday.  EXAM: CT ABDOMEN AND PELVIS WITHOUT CONTRAST  TECHNIQUE: Multidetector CT imaging of the abdomen and pelvis was performed following the standard protocol without IV contrast.  COMPARISON:  None.  FINDINGS: There is no nephrolithiasis or hydroureteronephrosis bilaterally. The kidneys are normal. The liver, spleen, pancreas are normal. The adrenal glands are normal. There is a gallstone in the gallbladder without perigallbladder inflammatory change. There is atherosclerosis of the abdominal aorta without aneurysmal dilatation. There is no abdominal lymphadenopathy.  There is a hiatal hernia. There is no  small bowel obstruction or diverticulitis. The appendix is not seen but no inflammation is noted around the cecum to suggest appendicitis.  Partial fluid-filled bladder is normal. Patient is status post prior hysterectomy. Minimal free fluid is identified in the pelvis.  There are small bilateral pleural effusions with atelectasis of the posterior lungs. Degenerative joint changes of  the spine are noted.  IMPRESSION: No acute abnormality identified in the abdomen and pelvis. There is no bowel obstruction or diverticulitis.   Electronically Signed   By: Abelardo Diesel M.D.   On: 01/19/2015 19:07   Dg Chest 2 View  01/25/2015   CLINICAL DATA:  Dyspnea. Breast cancer. Chronic kidney disease. Hypertension.  EXAM: CHEST  2 VIEW  COMPARISON:  01/22/2015 chest radiograph  FINDINGS: Left internal jugular central venous catheter terminates at the cavoatrial junction. Stable cardiomediastinal silhouette with mild cardiomegaly. No pneumothorax. Small bilateral pleural effusions, likely stable. Mild pulmonary edema, slightly worsened. Bibasilar lung opacities, not appreciably changed, likely atelectasis and edema. Surgical clips in the right breast. Moderate degenerative changes in the thoracic spine.  IMPRESSION: 1. Mild congestive heart failure, slightly worsened. 2. Small bilateral pleural effusions, likely stable. 3. Stable bibasilar lung opacities, likely atelectasis and edema.   Electronically Signed   By: Ilona Sorrel M.D.   On: 01/25/2015 15:31   Dg Chest Port 1 View  01/22/2015   CLINICAL DATA:  hypotension.  Nausea, vomiting, diarrhea.  EXAM: PORTABLE CHEST - 1 VIEW  COMPARISON:  01/20/2015  FINDINGS: Left central line remains in place, unchanged. There is mild cardiomegaly and vascular congestion. Increasing bibasilar atelectasis or infiltrates. No visible effusions or acute bony abnormality.  IMPRESSION: Mild cardiomegaly and vascular congestion.  Increasing bibasilar atelectasis or infiltrates.   Electronically  Signed   By: Rolm Baptise M.D.   On: 01/22/2015 12:43   Dg Chest Port 1 View  01/20/2015   CLINICAL DATA:  Central line placement.  Hypertension.  Diabetes.  EXAM: PORTABLE CHEST - 1 VIEW  COMPARISON:  01/19/2015  FINDINGS: There is a new left jugular central line with tip in the low SVC. There is no pneumothorax. Persistent interstitial thickening without significant interval change. No confluent consolidation.  IMPRESSION: New left jugular central line.  No pneumothorax.   Electronically Signed   By: Andreas Newport M.D.   On: 01/20/2015 01:22   Dg Chest Portable 1 View  01/19/2015   CLINICAL DATA:  Shortness of breath and emesis  EXAM: PORTABLE CHEST - 1 VIEW  COMPARISON:  Oct 31, 2009  FINDINGS: The heart size and mediastinal contours are stable. The heart size is enlarged. The aorta is tortuous. There is diffuse bilateral increased pulmonary interstitium. There is no focal pneumonia or pleural effusion. The visualized skeletal structures are stable.  IMPRESSION: Cardiomegaly.  Interstitial edema.   Electronically Signed   By: Abelardo Diesel M.D.   On: 01/19/2015 20:34     Microbiology: Recent Results (from the past 240 hour(s))  Blood culture (routine x 2)     Status: None   Collection Time: 01/19/15  5:13 PM  Result Value Ref Range Status   Specimen Description BLOOD RIGHT HAND  Final   Special Requests BOTTLES DRAWN AEROBIC AND ANAEROBIC 5CC  Final   Culture NO GROWTH 5 DAYS  Final   Report Status 01/24/2015 FINAL  Final  Blood culture (routine x 2)     Status: None   Collection Time: 01/19/15  5:25 PM  Result Value Ref Range Status   Specimen Description BLOOD RIGHT FOREARM  Final   Special Requests BOTTLES DRAWN AEROBIC AND ANAEROBIC 5ML  Final   Culture NO GROWTH 5 DAYS  Final   Report Status 01/24/2015 FINAL  Final  MRSA PCR Screening     Status: None   Collection Time: 01/19/15 10:21 PM  Result Value Ref Range Status  MRSA by PCR NEGATIVE NEGATIVE Final    Comment:          The GeneXpert MRSA Assay (FDA approved for NASAL specimens only), is one component of a comprehensive MRSA colonization surveillance program. It is not intended to diagnose MRSA infection nor to guide or monitor treatment for MRSA infections.   Urine culture     Status: None   Collection Time: 01/20/15  5:49 AM  Result Value Ref Range Status   Specimen Description URINE, CLEAN CATCH  Final   Special Requests NONE  Final   Culture NO GROWTH 1 DAY  Final   Report Status 01/21/2015 FINAL  Final  Surgical pcr screen     Status: None   Collection Time: 01/26/15  1:06 PM  Result Value Ref Range Status   MRSA, PCR NEGATIVE NEGATIVE Final   Staphylococcus aureus NEGATIVE NEGATIVE Final    Comment:        The Xpert SA Assay (FDA approved for NASAL specimens in patients over 23 years of age), is one component of a comprehensive surveillance program.  Test performance has been validated by [facility] for patients greater than or equal to 17 year old. It is not intended to diagnose infection nor to guide or monitor treatment.      Labs: Basic Metabolic Panel:  Recent Labs Lab 01/22/15 0435 01/23/15 0342 01/24/15 0450 01/25/15 1333 01/26/15 0508 01/26/15 1628 01/27/15 0605  NA 143 139 137 137 137  --  139  K 4.5 5.3* 5.0 4.5 3.9  --  3.2*  CL 102 105 100* 98* 99*  --  94*  CO2 31 28 28 26 27   --  32  GLUCOSE 117* 124* 104* 163* 114*  --  110*  BUN 27* 14 14 14 14   --  15  CREATININE 1.86* 1.61* 1.67* 1.51* 1.43* 1.46* 1.37*  CALCIUM 7.1* 7.8* 8.2* 8.8* 8.7*  --  8.6*  MG 1.0* 1.7 1.7  --   --   --  1.5*  PHOS 2.4*  --  3.4  --   --   --   --    Liver Function Tests: No results for input(s): AST, ALT, ALKPHOS, BILITOT, PROT, ALBUMIN in the last 168 hours. No results for input(s): LIPASE, AMYLASE in the last 168 hours. No results for input(s): AMMONIA in the last 168 hours. CBC:  Recent Labs Lab 01/23/15 0342 01/24/15 0450 01/25/15 1333 01/26/15 0508  01/26/15 1628  WBC 5.6 4.1 4.8 4.4 4.0  HGB 8.5* 7.6* 8.3* 7.8* 8.2*  HCT 26.0* 23.8* 26.1* 24.3* 25.4*  MCV 98.1 97.5 100.0 98.4 97.3  PLT 81* 83* 94* 110* 108*   Cardiac Enzymes: No results for input(s): CKTOTAL, CKMB, CKMBINDEX, TROPONINI in the last 168 hours. BNP: Invalid input(s): POCBNP CBG:  Recent Labs Lab 01/26/15 1501 01/26/15 1633 01/26/15 2152 01/27/15 0732 01/27/15 1120  GLUCAP 94 96 129* 109* 159*    Time coordinating discharge:  Greater than 30 minutes  Signed:  Shanya Ferriss, DO [facility] Pager: [phone] 01/27/2015, 12:51 PM

## 2015-01-27 NOTE — Progress Notes (Signed)
Advanced Heart Failure Rounding Note  Referring Physician: [place] Pink Primary Physician: Felipa Eth Primary Cardiologist: New  Subjective:     Feels encouraged after her cath.  But had a rough night with N/V. No pain or obvious hematoma at R groin site.  Denies SOB or CP.  L/RHC Yesterday. RA = 11 RV = 42/8/15 PA = 38/8 (24) PCW = 19 Fick cardiac output/index = 5.2/3.0 Thermo CO/CI = 5.7/3.2 PVR = 1.0 WU FA sat = 93% PA sat = 51%, 51%  Ao Pressure: 133/60 (85) LV Pressure: 133/9/17 No aortic stenosis  Assessment: 1) Moderate 1v CAD with 60% proximal LAD lesion 2) Non-ischemic CM with EF 30-35% by echo 3) Mildly elevated R-sided pressures with normal cardiac output  Objective:   Weight Range: 156 lb 12.8 oz (71.124 kg) Body mass index is 28.67 kg/(m^2).   Vital Signs:   Temp:  [98 F (36.7 C)-98.8 F (37.1 C)] 98 F (36.7 C) (08/05 0500) Pulse Rate:  [0-93] 93 (08/05 0500) Resp:  [0-22] 22 (08/05 0500) BP: (131-160)/(46-78) 132/46 mmHg (08/05 0500) SpO2:  [0 %-98 %] 96 % (08/05 0624) Weight:  [156 lb 12.8 oz (71.124 kg)] 156 lb 12.8 oz (71.124 kg) (08/05 0500) Last BM Date: 01/26/15  Weight change: Filed Weights   01/25/15 0500 01/26/15 0621 01/27/15 0500  Weight: 177 lb (80.287 kg) 167 lb 14.4 oz (76.159 kg) 156 lb 12.8 oz (71.124 kg)    Intake/Output:   Intake/Output Summary (Last 24 hours) at 01/27/15 0815 Last data filed at 01/27/15 0809  Gross per 24 hour  Intake    520 ml  Output   3401 ml  Net  -2881 ml     Physical Exam: General: Well appearing. No resp difficulty HEENT: normal Neck: supple. JVP 7-8. Carotids 2+ bilat; no bruits. No lymphadenopathy or thryomegaly appreciated. Cor: PMI nondisplaced. Regularly irregular rate & rhythm. No rubs, gallops or murmurs appreciated. Lungs: clear Abdomen: Obese, soft, nontender, mildly distended. No hepatosplenomegaly. No bruits or masses. Good bowel sounds. Extremities: no cyanosis, clubbing,  rash. trace edema. R femoral site clean and dry. No tenderness or hematoma. Neuro: alert & orientedx3, cranial nerves grossly intact. moves all 4 extremities w/o difficulty. Affect pleasant  Telemetry: NSR 70s.   Reading as irregular, but P waves present.  Labs: CBC  Recent Labs  01/26/15 0508 01/26/15 1628  WBC 4.4 4.0  HGB 7.8* 8.2*  HCT 24.3* 25.4*  MCV 98.4 97.3  PLT 110* 123XX123*   Basic Metabolic Panel  Recent Labs  01/26/15 0508 01/27/15 0605  NA 137 139  K 3.9 3.2*  CL 99* 94*  CO2 27 32  GLUCOSE 114* 110*  BUN 14 15  CALCIUM 8.7* 8.6*  MG  --  1.5*   Liver Function Tests No results for input(s): AST, ALT, ALKPHOS, BILITOT, PROT, ALBUMIN in the last 72 hours. No results for input(s): LIPASE, AMYLASE in the last 72 hours. Cardiac Enzymes No results for input(s): CKTOTAL, CKMB, CKMBINDEX, TROPONINI in the last 72 hours.  BNP: BNP (last 3 results) No results for input(s): BNP in the last 8760 hours.  ProBNP (last 3 results) No results for input(s): PROBNP in the last 8760 hours.   D-Dimer No results for input(s): DDIMER in the last 72 hours. Hemoglobin A1C No results for input(s): HGBA1C in the last 72 hours. Fasting Lipid Panel No results for input(s): CHOL, HDL, LDLCALC, TRIG, CHOLHDL, LDLDIRECT in the last 72 hours. Thyroid Function Tests No results for input(s): TSH, T4TOTAL, T3FREE,  THYROIDAB in the last 72 hours.  Invalid input(s): FREET3  Other results:     Imaging/Studies:  Dg Chest 2 View  01/25/2015   CLINICAL DATA:  Dyspnea. Breast cancer. Chronic kidney disease. Hypertension.  EXAM: CHEST  2 VIEW  COMPARISON:  01/22/2015 chest radiograph  FINDINGS: Left internal jugular central venous catheter terminates at the cavoatrial junction. Stable cardiomediastinal silhouette with mild cardiomegaly. No pneumothorax. Small bilateral pleural effusions, likely stable. Mild pulmonary edema, slightly worsened. Bibasilar lung opacities, not appreciably  changed, likely atelectasis and edema. Surgical clips in the right breast. Moderate degenerative changes in the thoracic spine.  IMPRESSION: 1. Mild congestive heart failure, slightly worsened. 2. Small bilateral pleural effusions, likely stable. 3. Stable bibasilar lung opacities, likely atelectasis and edema.   Electronically Signed   By: Ilona Sorrel M.D.   On: 01/25/2015 15:31    Latest Echo  Latest Cath   Medications:     Scheduled Medications: . allopurinol  400 mg Oral Daily  . anastrozole  1 mg Oral Daily  . antiseptic oral rinse  7 mL Mouth Rinse q12n4p  . aspirin  325 mg Oral Daily  . atorvastatin  20 mg Oral q morning - 10a  . carvedilol  3.125 mg Oral BID WC  . chlorhexidine  15 mL Mouth Rinse BID  . furosemide  60 mg Intravenous Q12H  . heparin  5,000 Units Subcutaneous 3 times per day  . insulin aspart  0-15 Units Subcutaneous TID WC & HS  . magnesium sulfate 1 - 4 g bolus IVPB  2 g Intravenous Once  . pantoprazole  40 mg Oral Daily  . sodium chloride  3 mL Intravenous Q12H  . sodium polystyrene  30 g Oral Once  . venlafaxine  50 mg Oral BID WC    Infusions:    PRN Medications: sodium chloride, sodium chloride, acetaminophen, diazepam, diphenhydrAMINE, fentaNYL (SUBLIMAZE) injection, LORazepam, ondansetron, ondansetron (ZOFRAN) IV, promethazine, sodium chloride, sodium chloride, traMADol, zolpidem   Assessment/Plan    1. Acute systolic CHF : EF 99991111, decreased from normal in 1/15 2. AKI on CKD, Stage 3 - has improved greatly since admission. Baseline appears to be 1.3-1.4. 3. Hx of Breast Cancer - 3 doses of Herceptin in 12 /14 4. DM2 x 2 years 5. HTN 6. HLD 7. Heavy snoring - probable OSA. Will need sleep study.  With cath findings CM appears to be non ischemic. Has had minimal HF symptoms this admission.  Likely ok to go home today. Will titrate BB as tolerated. Was on 20 mg Lisinopril at home, has not had this admission so would be sufficiently  washed out to switch to [place].  Will consult case management to see how her insurance would cover it.  HR regularly irregular.  Will get EKG.   Will f/u in HF clinic. Risk factor management for CAD.  Dispo:  Clear to go home from HF standpoint.  Will likely need chronic diuresis.  Will discuss with MD for med optimization.   Length of Stay: 8   Shirley Friar PA-C 01/27/2015, 8:15 AM  Advanced Heart Failure Team Pager [phone] (M-F; 7a - 4p)  Please contact [facility] for night-coverage after hours (4p -7a ) and weekends on [email or web address]  Patient seen with PA, agree with the above note.  LHC/RHC yesterday suggestive of nonischemic cardiomyopathy (moderate nonobstructive CAD), filling pressures looked ok.  - May stop IV Lasix today, tomorrow start Lasix 40 qam/20 qpm po (was on 40 daily at home  prior).  - Will start Entresto 24/26 bid to replace lisinopril. - Continue Coreg.  - ASA and statin given presence of CAD - OK for home from cardiac standpoint.   Loralie Champagne 01/27/2015 10:36 AM

## 2015-01-27 NOTE — Progress Notes (Signed)
01/27/2015  PT SATURATION QUALIFICATIONS: (This note is used to comply with regulatory documentation for home oxygen)  Patient Saturations on Room Air at Rest = 87%  Patient Saturations on Room Air while Ambulating = 87%  Patient Saturations on 2 Liters of oxygen while Ambulating = 97%  Please briefly explain why patient needs home oxygen: Pt not only de-sats on RA at rest, but with mobility as well.  She needs supplemental O2 to maintain acceptable oxygen levels both at rest and while moving.   Barbarann Ehlers Markle, [place], DPT [phone]

## 2015-01-27 NOTE — Care Management Note (Addendum)
Case Management Note Marvetta Gibbons RN, BSN Unit 2W-Case Manager [phone] Covering 3W  Patient Details  Name: Rebecca Hall MRN: [id number] Date of Birth: 1931-12-01  Subjective/Objective:  Pt admitted with hypovolemic shock                  Action/Plan: PTA pt lived at home with spouse- PT eval ordered- CSW assessed pt for SNF placement pt prefers to return home with [facility] due to insurance not being in-network with preferred facility. Spoke with pt at bedside regarding [facility] agency of choice- per pt she would like to use [facility] for [facility] and DME needs- referral for [facility] services called to [name] with [facility]- for HH-RN/PT/OT/aide/SW- also spoke with [name] with [facility] regarding home 02 needs- portable tank to be delivered to room prior to discharge- also received referral regarding [name]- have requested a benefits check and have attempted to speak with pt's insurance regarding copay- per insurance pt will need a pre-auth for medication- # to call for pre-auth is [phone]- paged HF team and spoke with [name] regarding pre-auth need per conversation he is going to take care of the pre-auth (# to call given to PA)-- pt given 30 day free card for [facility]- HF team will work on pre-auth post discharge  Expected Discharge Date:  01/23/15               Expected Discharge Plan:  [place]  In-House Referral:  Clinical Social Work  Discharge planning Services  CM Consult  Post Acute Care Choice:  Durable Medical Equipment, Home Health Choice offered to:  Patient  DME Arranged:  Oxygen DME Agency:  [place] Arranged:  RN, PT, OT, Nurse's Aide [place] Agency:  [place]  Status of Service:  Completed, signed off  Medicare Important Message Given:  Yes-fourth notification given Date Medicare IM Given:    Medicare IM give by:    Date Additional Medicare IM Given:    Additional Medicare Important Message give by:     If discussed at [place] of Stay  Meetings, dates discussed:    Additional Comments:  Dawayne Patricia, RN 01/27/2015, 2:40 PM

## 2015-01-28 ENCOUNTER — Telehealth: Payer: Self-pay | Admitting: Cardiology

## 2015-01-28 NOTE — Telephone Encounter (Signed)
Pt could not receive the Entresto until insurance approved. I called and spent 30 min or longer with 2 phone calls trying to expedite the process.  Now waiting for approval.  Insurance company will call the pt when/if approved.  Also will fax to our office and to the pharmacy.  Pt made aware. In the meantime, she will callif wt begins to climb.

## 2015-01-30 ENCOUNTER — Telehealth (HOSPITAL_COMMUNITY): Payer: Self-pay

## 2015-01-30 NOTE — Telephone Encounter (Addendum)
Recevied VM from [place] who helped facilitate DC of patient from hospital over weekend.  There was difficulty getting patient started on Entresto.  For some reason her 30 day free card would not work "based on her insurance", however this card is able to be used by anyone regardless of insurance status.  Left VM for patient on cell phone (home phone line busy) to see if maybe the card was not activated, and to get more clarity on if she ever used the card or got samples, and how CHF clinic can further help this process.  Heather RN in CHF clinic who handles [place] PAs also made aware who will work on getting her insurance to approve this medication.  Left call back number to follow up with [place] and let us know how we can help.  Renee Pain

## 2015-01-30 NOTE — Care Management (Signed)
1141 01-30-15 CM did call pt in regards to medication entresto. Pt was able to get medication on Saturday evening via [facility]. CM did make NP aware as well. No further needs from CM at this time.

## 2015-01-31 NOTE — Patient Outreach (Signed)
[facility]) Care Management  01/31/2015  Rebecca Hall 1932-01-02 [id number]   Referral from [facility], assigned to Quinn Plowman, RN for patient outreach.  Juana Montini L. Mika Griffitts, [place] Care Management Assistant

## 2015-02-07 ENCOUNTER — Other Ambulatory Visit: Payer: Self-pay

## 2015-02-07 DIAGNOSIS — Z79899 Other long term (current) drug therapy: Secondary | ICD-10-CM

## 2015-02-07 DIAGNOSIS — I5032 Chronic diastolic (congestive) heart failure: Secondary | ICD-10-CM

## 2015-02-07 NOTE — Patient Outreach (Addendum)
[place] [facility]) Care Management  02/07/2015  Rebecca Hall 01-28-1932 [id number]   SUBJECTIVE: Telephone call to patient regarding [facility] referral.  Patient states she was recently discharged from the hospital.  Patient states, "I had vomiting and diarrhea but really can't remember much past that."  Patient states she saw her primary doctor on that day who sent her to the hospital from his office.   Patient states she has a follow up appointment at the heart clinic on 02/09/15 and has a follow up appointment with her primary doctor on 02/16/15.   Patient states she has services with [facility].  Patient states she has a Marine scientist and physical therapy.  Patient states she was told she had heart failure during her hospital admission.  Patient states, " I never knew I had this." Patient reports she is not weighing at home daily.  Patient states her blood pressure dropped very low while she was in the hospital and states it continues to be on the low end now. Patient reports she is not checking her blood pressures at home.  Patient states the home health nurse checks her blood pressure.  Patient states her blood pressure monitor is not working.  Patient reports she takes her medications as prescribed by her doctor.  Patient states she became very weak after being in the hospital recently. States she was ambulating with a walker but now has progressed to a cane.  Patient states she still uses her walker as needed.   Patient states she has had diabetes for approximately 2 years.  Patient states she has never checked her blood sugars at home and states she does not have a glucometer. Patient states she does not know what her A1c level is.  Patient states she does take oral medication daily for her diabetes.   RNCM discussed and offered [facility] care management services to patient.  Patient verbally agreed to receive services. Patient voiced concern that she visits her children on occasion at  their homes and would like to have that flexibility with the [facility] care management program.   ASSESSMENT:  [facility] referral.  Patient with recent hospital admission at [facility] from 01/19/15 to 01/27/15 for Acute hypoxic respiratory failure / chronic diastolic heart failure.  Patient reports not weighing daily and unable to verbalize heart failure symptoms. Patient reports not checking blood pressure and reports not having workable blood pressure monitor Patient reports not checking blood sugars and does not know A1C level    Patient will benefit from community case manager for education and management of heart failure, diabetes, and blood pressure monitoring and care coordination.  Patient has scheduled follow up appointment with heart clinic 02/09/15 status post hospitalization. RNCM assured patient that community nurse will work with patient regarding scheduling of visits. Patient will benefit from pharmacy referral for poly pharmacy assessment.  PLAN: RNCM will refer patient to community case manager and pharmacy resident.    Quinn Plowman RN,BSN,CCM [place] Coordinator [phone]

## 2015-02-07 NOTE — Patient Outreach (Signed)
[facility]) Care Management  02/07/2015  Rebecca Hall 12/03/1931 [id number]   Notification from Quinn Plowman, RN to assign Pharmacy and Community RN, assigned Harlow Asa, PharmD and Tomasa Rand, RN.  Thanks, Ronnell Freshwater. [place], [place] Assistant Phone: [phone] Fax: [phone]

## 2015-02-08 ENCOUNTER — Other Ambulatory Visit: Payer: Self-pay

## 2015-02-08 NOTE — Patient Outreach (Signed)
New referral received today from telephonic nurse. Placed call to patient. Explained program.  Patient  hesitant about wanting home visits. Reports that she is active with home health. Reports that she has follow up appointments with MD in the next 2 week.  Patient reports that she thinks she is doing okay. Reports that she has never been ask to check CBG. Reports that she does not weigh daily. Reports that she checks her fluid status by pushing her finger in on her skin on her feet and legs.  Explained the importance of daily weights and what to do for weight gain.  Reviewed last Hgb A1c of 7.8 in the hospital. Patient reports that she is happy with that result.   Plan:  Patient would like me to mail out information about [facility] program. Patient request a call back in 2-3 weeks when she will decide if she would like a home visit and assessment.  Tomasa Rand, RN, BSN, CEN [facility] [employer] [phone]

## 2015-02-09 ENCOUNTER — Ambulatory Visit (HOSPITAL_COMMUNITY)
Admit: 2015-02-09 | Discharge: 2015-02-09 | Disposition: A | Discharge status: Home / Self Care | Payer: PPO | Source: Ambulatory Visit | Attending: Internal Medicine | Admitting: Internal Medicine

## 2015-02-09 ENCOUNTER — Encounter (HOSPITAL_COMMUNITY): Payer: Self-pay

## 2015-02-09 VITALS — BP 72/40 | HR 77 | Wt 154.0 lb

## 2015-02-09 DIAGNOSIS — K219 Gastro-esophageal reflux disease without esophagitis: Secondary | ICD-10-CM | POA: Diagnosis not present

## 2015-02-09 DIAGNOSIS — R0683 Snoring: Secondary | ICD-10-CM | POA: Insufficient documentation

## 2015-02-09 DIAGNOSIS — Z79899 Other long term (current) drug therapy: Secondary | ICD-10-CM | POA: Insufficient documentation

## 2015-02-09 DIAGNOSIS — Z79811 Long term (current) use of aromatase inhibitors: Secondary | ICD-10-CM | POA: Insufficient documentation

## 2015-02-09 DIAGNOSIS — M810 Age-related osteoporosis without current pathological fracture: Secondary | ICD-10-CM | POA: Insufficient documentation

## 2015-02-09 DIAGNOSIS — C50911 Malignant neoplasm of unspecified site of right female breast: Secondary | ICD-10-CM | POA: Insufficient documentation

## 2015-02-09 DIAGNOSIS — I129 Hypertensive chronic kidney disease with stage 1 through stage 4 chronic kidney disease, or unspecified chronic kidney disease: Secondary | ICD-10-CM | POA: Diagnosis not present

## 2015-02-09 DIAGNOSIS — E785 Hyperlipidemia, unspecified: Secondary | ICD-10-CM | POA: Diagnosis not present

## 2015-02-09 DIAGNOSIS — I959 Hypotension, unspecified: Secondary | ICD-10-CM | POA: Insufficient documentation

## 2015-02-09 DIAGNOSIS — N183 Chronic kidney disease, stage 3 (moderate): Secondary | ICD-10-CM | POA: Insufficient documentation

## 2015-02-09 DIAGNOSIS — I5022 Chronic systolic (congestive) heart failure: Secondary | ICD-10-CM | POA: Diagnosis not present

## 2015-02-09 DIAGNOSIS — I5021 Acute systolic (congestive) heart failure: Secondary | ICD-10-CM

## 2015-02-09 DIAGNOSIS — E1122 Type 2 diabetes mellitus with diabetic chronic kidney disease: Secondary | ICD-10-CM | POA: Diagnosis not present

## 2015-02-09 DIAGNOSIS — Z7982 Long term (current) use of aspirin: Secondary | ICD-10-CM | POA: Insufficient documentation

## 2015-02-09 LAB — BASIC METABOLIC PANEL
Anion gap: 14 (ref 5–15)
BUN: 48 mg/dL — ABNORMAL HIGH (ref 6–20)
CO2: 19 mmol/L — ABNORMAL LOW (ref 22–32)
Calcium: 10.2 mg/dL (ref 8.9–10.3)
Chloride: 100 mmol/L — ABNORMAL LOW (ref 101–111)
Creatinine, Ser: 2.81 mg/dL — ABNORMAL HIGH (ref 0.44–1.00)
GFR calc Af Amer: 17 mL/min — ABNORMAL LOW (ref >60)
GFR calc non Af Amer: 15 mL/min — ABNORMAL LOW (ref >60)
Glucose, Bld: 123 mg/dL — ABNORMAL HIGH (ref 65–99)
Potassium: 6.3 mmol/L (ref 3.5–5.1)
Sodium: 133 mmol/L — ABNORMAL LOW (ref 135–145)

## 2015-02-09 LAB — BRAIN NATRIURETIC PEPTIDE: B Natriuretic Peptide: 29.9 pg/mL (ref 0.0–100.0)

## 2015-02-09 LAB — CBC
HCT: 32.8 % — ABNORMAL LOW (ref 36.0–46.0)
Hemoglobin: 10.7 g/dL — ABNORMAL LOW (ref 12.0–15.0)
MCH: 32.2 pg (ref 26.0–34.0)
MCHC: 32.6 g/dL (ref 30.0–36.0)
MCV: 98.8 fL (ref 78.0–100.0)
Platelets: 245 10*3/uL (ref 150–400)
RBC: 3.32 MIL/uL — ABNORMAL LOW (ref 3.87–5.11)
RDW: 15.2 % (ref 11.5–15.5)
WBC: 6 10*3/uL (ref 4.0–10.5)

## 2015-02-09 MED ORDER — FUROSEMIDE 20 MG PO TABS: 20.0000 mg | ORAL_TABLET | ORAL | Status: DC | PRN

## 2015-02-09 NOTE — Progress Notes (Signed)
Patient seen in CHF clinic today, orthostatic.  Per Amy Clegg NP-C, PIV 22 g x 1 attempt started in LAC and 1000 cc 0.9% NS bolus given over 30 minutes. Patient tolerating well. Will continue to monitor closely. PIV removed and clean dry gauze dressing applied before patient discharged home.  Renee Pain

## 2015-02-09 NOTE — Progress Notes (Signed)
Patient ID: [name], female   DOB: [birth year], 81 y.o.   MRN: [id number] PCP: Dr Felipa Eth.  Primary HF: Dr Haroldine Laws Oncologist: Dr Jana Hakim  HPI: TYYANA THUESEN is a 81 y.o. female with history of Breast Cancer treated with 3 doses only of herceptin in 2014, CKD, HTN, HLD, Osteoporosis, GERD, Hx of GI Bleed, and DM.  Admitted to [facility] with volume overload, hypovolemic shock, AKI creatinine 4.7,  and new onset LV dysfunction. Had cath as noted below nonobstructive cad-60 proximal LAD and NICM. Discharged on lasix, carvedilol, and entresto. Discharge weight was 156 pounds.   She returns for post hospital follow up. Complaining of dizziness while sitting and standing.  Progressively worse over the last few days. Has not been weighing at home. Breathing ok just dizzy. Taking all medications. [facility]HC following. No bleeding problems.   [facility] 01/27/2015  RA = 11 RV = 42/8/15 PA = 38/8 (24) PCW = 19 Fick cardiac output/index = 5.2/3.0 Thermo CO/CI = 5.7/3.2 PVR = 1.0 WU FA sat = 93% PA sat = 51%, 51% Ao Pressure: 133/60 (85) LV Pressure: 133/9/17 No aortic stenosis  Assessment: 1) Moderate 1v CAD with 60% proximal LAD lesion 2) Non-ischemic CM with EF 30-35% by echo 3) Mildly elevated R-sided pressures with normal cardiac output   ROS: All systems negative except as listed in HPI, PMH and Problem List.  SH:  Social History   Social History  . Marital Status: Married    Spouse Name: N/A  . Number of Children: N/A  . Years of Education: N/A   Occupational History  . Not on file.   Social History Main Topics  . Smoking status: Never Smoker   . Smokeless tobacco: Never Used  . Alcohol Use: No  . Drug Use: No  . Sexual Activity: Not on file     Comment: first live birth age 45, P 2, Estrogen    Other Topics Concern  . Not on file   Social History Narrative    FH:  Family History  Problem Relation Age of Onset  . Cancer Maternal Aunt 80    colon cancer  . Cancer  Maternal Uncle 65    lung  . Cancer Maternal Grandmother     pancreas    Past Medical History  Diagnosis Date  . Hyperlipidemia   . Hypertension   . Chronic kidney disease   . Osteoporosis   . GERD (gastroesophageal reflux disease)   . IBS (irritable bowel syndrome)   . Arthritis   . Neuromuscular disorder     peripheral neuropathy feet  . History of lower GI bleeding   . Breast cancer 03/15/13    right, 12 o'clock  . Diabetes mellitus without complication     Current Outpatient Prescriptions  Medication Sig Dispense Refill  . allopurinol (ZYLOPRIM) 100 MG tablet Take 100 mg by mouth daily. Total of 400mg  daily    . anastrozole (ARIMIDEX) 1 MG tablet TAKE 1 TABLET (1 MG TOTAL) BY MOUTH DAILY. 90 tablet 3  . aspirin 325 MG tablet Take 325 mg by mouth daily.    Marland Kitchen atorvastatin (LIPITOR) 20 MG tablet Take 20 mg by mouth every morning.     . calcium carbonate (OS-CAL) 600 MG TABS tablet Take 600 mg by mouth 2 (two) times daily with a meal.     . carvedilol (COREG) 3.125 MG tablet Take 1 tablet (3.125 mg total) by mouth 2 (two) times daily with a meal. 60 tablet 0  .  diazepam (VALIUM) 5 MG tablet Take 1 tablet (5 mg total) by mouth at bedtime as needed for anxiety. 20 tablet 0  . diphenhydrAMINE (BENADRYL) 25 MG tablet Take 25 mg by mouth every 6 (six) hours as needed for allergies.    . diphenhydramine-acetaminophen (TYLENOL PM) 25-500 MG TABS Take 1 tablet by mouth at bedtime.     . furosemide (LASIX) 20 MG tablet Take 1 tablet (20 mg total) by mouth every evening. 30 tablet 1  . furosemide (LASIX) 40 MG tablet Take 1 tablet (40 mg total) by mouth daily with breakfast. 30 tablet 1  . gabapentin (NEURONTIN) 300 MG capsule TAKE 1 CAPSULE BY MOUTH DAILY AT BEDTIME 90 capsule 1  . glimepiride (AMARYL) 2 MG tablet Take 2 mg by mouth daily with breakfast.    . LORazepam (ATIVAN) 0.5 MG tablet Take 1 tablet (0.5 mg total) by mouth 2 (two) times daily as needed for anxiety. (Patient taking  differently: Take 0.25 mg by mouth 2 (two) times daily as needed for anxiety or sleep. ) 30 tablet 0  . minoxidil (ROGAINE) 2 % external solution Apply 1 application topically daily.     . Multiple Vitamin (MULTIVITAMIN WITH MINERALS) TABS tablet Take 1 tablet by mouth 2 (two) times daily.     Marland Kitchen omeprazole (PRILOSEC) 20 MG capsule Take 20 mg by mouth daily.     . potassium chloride SA (K-DUR,KLOR-CON) 20 MEQ tablet Take 1 tablet (20 mEq total) by mouth 2 (two) times daily. 60 tablet 0  . sacubitril-valsartan (ENTRESTO) 24-26 MG Take 1 tablet by mouth 2 (two) times daily. 60 tablet 1  . triamcinolone ointment (KENALOG) 0.5 % Apply 1 application topically 2 (two) times daily. 30 g 0  . Vitamin D, Ergocalciferol, (DRISDOL) 50000 UNITS CAPS capsule Take 50,000 Units by mouth every 14 (fourteen) days.     . Zoledronic Acid (ZOMETA) 4 MG/100ML IVPB Inject 4 mg into the vein every 6 (six) months.      No current facility-administered medications for this encounter.    Filed Vitals:   02/09/15 1423  BP: 72/40  Pulse: 77  Weight: 154 lb (69.854 kg)  SpO2: 99%    PHYSICAL EXAM: Orthostatics Sitting 72/40 Standing 58  General:  Fatigued appearing. No resp difficulty/ Husband and daughter present.  HEENT: normal Neck: supple. JVP flat. Carotids 2+ bilaterally; no bruits. No lymphadenopathy or thryomegaly appreciated. Cor: PMI normal. Regular rate & rhythm. No rubs, gallops or murmurs. Lungs: clear Abdomen: soft, nontender, nondistended. No hepatosplenomegaly. No bruits or masses. Good bowel sounds. Extremities: no cyanosis, clubbing, rash, edema Neuro: alert & orientedx3, cranial nerves grossly intact. Moves all 4 extremities w/o difficulty. Affect pleasant.   ASSESSMENT & PLAN: 1. Chronic  systolic CHF : EF 99991111, decreased from normal in 1/15 NYHA II. Volume status low. Orthostatic today. Give 1 liters NS now.  Hold carvedilol, entresto, lasix.  Restart carvedilol on Sunday.  Restart  entresto on Tuesday as long as SBP > 100.  Check BMET CBC now.   2. AKI on CKD, Stage 3 - Check BMET now.  3. Hx of Breast Cancer - 3 doses of Herceptin in 12 /14 4. DM2 x 2 years 5. [id number] 6. HLD 7. Heavy snoring - probable OSA. Will need sleep study. Set up at next appointment.  8. Hypotension - as above  Check BMET and CBC now. Give 1 NS in HF clinic now.   Follow up 3-4 week.s   CLEGG,AMY NP-C  3:02 PM  Patient seen and examined with Darrick Grinder, NP. We discussed all aspects of the encounter. I agree with the assessment and plan as stated above.   She is volume depleted with renal failure and hypotension. We will hold all diuretics as well carvedilol and Entresto. She was given 1L NS in clinic and felt better. Will liberalize fluids at home. Only use lasix prn for severe swelling. Resume carvedilol over the weekend as tolerated followed by [name]. Will need blood work next week. K here was 6.1 but labs hemolyzed. Will also need outpatient sleep study.  Total time spent 60 minutes. Over half that time spent discussing above.   Nisha Dhami,MD 4:41 PM

## 2015-02-09 NOTE — Patient Instructions (Addendum)
CHANGE Lasix to 20 mg as needed HOLD Carvedilol until Saturday 02/11/2015 HOLD Potassium HOLD Entresto until Tuesday 02/14/2015, only restart if SBP is greater than 100  Labs needed on Monday  Your physician recommends that you schedule a follow-up appointment in: 3-4 weeks  Do the following things EVERYDAY: 1) Weigh yourself in the morning before breakfast. Write it down and keep it in a log. 2) Take your medicines as prescribed 3) Eat low salt foods-Limit salt (sodium) to 2000 mg per day.  4) Stay as active as you can everyday 5) Limit all fluids for the day to less than 2 liters 6)

## 2015-02-10 ENCOUNTER — Other Ambulatory Visit: Payer: Self-pay | Admitting: Pharmacist

## 2015-02-10 NOTE — Patient Outreach (Signed)
Rebecca Hall is a 81 y.o. female referred to pharmacy for medication management and education. Patient reports that she is feeling much better than yesterday, no dizziness. Her weight is up to 156 pounds. Patient reports that the home health nurse has just arrived and so she would prefer if I could call back next week on Wednesday to speak with her.   Will follow up with the patient next week on 02/15/15 to discuss her medications.  Harlow Asa, PharmD Clinical Pharmacist [facility] [phone]

## 2015-02-13 ENCOUNTER — Ambulatory Visit (HOSPITAL_COMMUNITY)
Admission: RE | Admit: 2015-02-13 | Discharge: 2015-02-13 | Disposition: A | Discharge status: Home / Self Care | Payer: PPO | Source: Ambulatory Visit | Attending: Cardiology | Admitting: Cardiology

## 2015-02-13 DIAGNOSIS — I5022 Chronic systolic (congestive) heart failure: Secondary | ICD-10-CM

## 2015-02-13 LAB — BASIC METABOLIC PANEL
Anion gap: 9 (ref 5–15)
BUN: 19 mg/dL (ref 6–20)
CO2: 26 mmol/L (ref 22–32)
Calcium: 10.9 mg/dL — ABNORMAL HIGH (ref 8.9–10.3)
Chloride: 100 mmol/L — ABNORMAL LOW (ref 101–111)
Creatinine, Ser: 1.53 mg/dL — ABNORMAL HIGH (ref 0.44–1.00)
GFR calc Af Amer: 36 mL/min — ABNORMAL LOW (ref >60)
GFR calc non Af Amer: 31 mL/min — ABNORMAL LOW (ref >60)
Glucose, Bld: 132 mg/dL — ABNORMAL HIGH (ref 65–99)
Potassium: 4.7 mmol/L (ref 3.5–5.1)
Sodium: 135 mmol/L (ref 135–145)

## 2015-02-15 ENCOUNTER — Other Ambulatory Visit: Payer: Self-pay | Admitting: Pharmacist

## 2015-02-15 ENCOUNTER — Encounter: Payer: Self-pay | Admitting: Pharmacist

## 2015-02-15 NOTE — Patient Outreach (Signed)
Rebecca Hall is a 81 y.o. female referred to pharmacy for medication management and education. Called and spoke with patient. Patient reports that she is feeling better each day. Reports that she is taking her weight daily and that she is up just one pound today, which she attributes to her having such a good appetite yesterday. Reports that her blood pressure today is 124/57 with pulse of 60.   Reviewed with patient each of her medications including dose, indication and administration. Patient reports that she organizes her own medications using two weekly pillboxes, one blue and one yellow. States that having the two different colored ones also helps her to remember when to take her Vitamin D every other week, as she only puts this in one day of the yellow weekly box.   Patient reports that she had been instructed to hold many of her medications including furosemide, carvedilol, Entresto and glimepiride. Reports that she has now restarted her carvedilol and Entresto as instructed. However, states that she was instructed to hold the furosemide unless it is needed in the future for fluid weight gain. Has been instructed to call for a weight gain of 3 lbs. States that she was instructed to stop her potassium for now. Note per EPIC, her potassium level was elevated on 02/09/15.   Ms. Davidson states that she is seeing her PCP tomorrow and will follow up at that time about whether she should restart her glimepiride. Patient states that she does not and has not been advised to monitor her blood sugar at home. Patient asks about her lab results from 02/13/15, as she has not heard back from her provider. Instruct patient to follow up with her provider or to ask her PCP for this information at their visit tomorrow.  Patient is currently taking Tylenol PM at bedtime for sleep. Confirmed that patient takes this immediately before bed. Discussed with patient the potential risk for falls, dizziness and drying-effects  related to this medication. Patient states that she has had none of these side effects. Patient reports that she has had one fall in the past year. However, states that this was related to her acute respiratory exacerbation and directly proceeded her hospitalization.   Patient reports that she does not normally have issues with affording her medications. However, reports that she is wondering about the cost of her Entresto. This is a new medication for the patient and she has not had to pay a copay for it yet.    Drugs sorted by system:  Neurologic/Psychologic: Tylenol PM  Oncology: anastrazole, Zoledronic acid  Cardiovascular: aspirin, atorvastatin, carvedilol, PRN furosemide, Entresto  Pulmonary/Allergy: loratadine   Gastrointestinal: omeprazole  Endocrine:glimepiride  Renal/urinary: allopurinol  Pain:gabapentin  Miscellaneous: calcium, multivitamin, Vitamin D   Duplications in therapy: none noted Gaps in therapy: none noted Medications to avoid in the elderly: Tylenol PM, gabapentin in renal dysfunction (but dose appropriate) Drug interactions: None significant noted   Plan:  Will call patient's insurance company, [employer], to determine the coverage of her [name] and then follow up with the patient today.   Harlow Asa, PharmD Clinical Pharmacist [place] [facility] [phone]

## 2015-02-15 NOTE — Patient Outreach (Signed)
Called to follow up with Ms. Delafosse about the cost of her new medication. Let her know that I spoke with Santiago Glad at [facility] who states that [name] will be a tier 4 medication, with copays of $85 for 30 day supply and $255 for 90 day supply.  Discuss with patient the benefits of using the medication. Patient states that she will be able to afford this.   Patient states that she has no further medication questions for me at this time. Provide patient with my contact information. Will stop following patient for now.   Harlow Asa, PharmD Clinical Pharmacist [facility] [phone]

## 2015-02-15 NOTE — Patient Outreach (Signed)
Called patient's insurance company, [employer], to determine the coverage of her [name]. This is a new medication for the patient and she has not had to pay a copay for it yet.  Spoke with Santiago Glad at [employer] who states that [name] will be a tier 4 medication, with copays of $85 for 30 day supply and $255 for 90 day supply.  Will call to let patient know now.   Harlow Asa, PharmD Clinical Pharmacist [facility] [phone]

## 2015-02-17 ENCOUNTER — Telehealth (HOSPITAL_COMMUNITY): Payer: Self-pay | Admitting: *Deleted

## 2015-02-17 NOTE — Telephone Encounter (Signed)
Pts home health RN called stated pt had syncopal episode today her bp was 90/48. Spoke with Dr.Bensimhon he advised pt to hold her entresto.

## 2015-02-28 ENCOUNTER — Other Ambulatory Visit: Payer: Self-pay

## 2015-02-28 DIAGNOSIS — I509 Heart failure, unspecified: Secondary | ICD-10-CM

## 2015-02-28 NOTE — Patient Outreach (Signed)
[facility]) Care Management  02/28/2015  BETTEJANE SAHLI 07-15-1931 [id number]   Request from Tomasa Rand, RN to assigned telephonic RN as patient refused home visits but is willing to participate with [facility] via telephone, assigned Quinn Plowman, RN.  Thanks, Ronnell Freshwater. [place], [place] Assistant Phone: [phone] Fax: [phone]

## 2015-02-28 NOTE — Patient Outreach (Signed)
Follow up call about referral:  Placed call to patient to discuss [facility] program. Patient reports that she does not know if she read the packet of information that was sent to her by [facility].  Reviewed [facility] program and reason for call.  Patient reports that she has been doing better in the last week. Reports that she had some problems 2 weeks ago with her blood pressure. Reports that she is checking her blood pressure daily. Reports range for blood pressure 100-132/ 64-70.  Reports that she is now weighing daily. Reports weight range of 151-153 pounds.  Reports that if she has swelling she takes a Lasix.  States that [facility] completed their case last weekend.  Reports that she has a follow up appointment with the heart failure clinic on 9/15.   At this time patient has refused home visits. Reports that she would work with a nurse by phone call.  PLAN: Will close to community as patient has refused home assessments and evaluation and patient prefers to work with telephonic nurse case Fre[employer] forward[employer].  Tomasa Rand, RN, BSN, CEN [facility] [employer] [phone]

## 2015-03-03 ENCOUNTER — Other Ambulatory Visit (HOSPITAL_COMMUNITY): Payer: Self-pay | Admitting: Cardiology

## 2015-03-03 DIAGNOSIS — I5022 Chronic systolic (congestive) heart failure: Secondary | ICD-10-CM

## 2015-03-03 MED ORDER — SACUBITRIL-VALSARTAN 24-26 MG PO TABS: 1.0000 | ORAL_TABLET | Freq: Two times a day (BID) | ORAL | Status: DC

## 2015-03-03 MED ORDER — FUROSEMIDE 20 MG PO TABS: 20.0000 mg | ORAL_TABLET | ORAL | Status: DC | PRN

## 2015-03-03 MED ORDER — CARVEDILOL 3.125 MG PO TABS: 3.1250 mg | ORAL_TABLET | Freq: Two times a day (BID) | ORAL | Status: DC

## 2015-03-03 NOTE — Telephone Encounter (Signed)
Patient has been having a hard time getting coreg refills as Dr.Tat name is on the Wing returned to pharmacy

## 2015-03-09 ENCOUNTER — Encounter (HOSPITAL_COMMUNITY): Payer: Self-pay

## 2015-03-09 ENCOUNTER — Other Ambulatory Visit (HOSPITAL_COMMUNITY): Payer: Self-pay | Admitting: *Deleted

## 2015-03-09 ENCOUNTER — Ambulatory Visit (HOSPITAL_COMMUNITY)
Admission: RE | Admit: 2015-03-09 | Discharge: 2015-03-09 | Disposition: A | Discharge status: Home / Self Care | Payer: PPO | Source: Ambulatory Visit | Attending: Internal Medicine | Admitting: Internal Medicine

## 2015-03-09 VITALS — BP 136/74 | HR 73 | Wt 153.8 lb

## 2015-03-09 DIAGNOSIS — R0683 Snoring: Secondary | ICD-10-CM | POA: Diagnosis not present

## 2015-03-09 DIAGNOSIS — Z7982 Long term (current) use of aspirin: Secondary | ICD-10-CM | POA: Diagnosis not present

## 2015-03-09 DIAGNOSIS — K219 Gastro-esophageal reflux disease without esophagitis: Secondary | ICD-10-CM | POA: Diagnosis not present

## 2015-03-09 DIAGNOSIS — N183 Chronic kidney disease, stage 3 unspecified: Secondary | ICD-10-CM

## 2015-03-09 DIAGNOSIS — Z853 Personal history of malignant neoplasm of breast: Secondary | ICD-10-CM | POA: Insufficient documentation

## 2015-03-09 DIAGNOSIS — I5022 Chronic systolic (congestive) heart failure: Secondary | ICD-10-CM | POA: Insufficient documentation

## 2015-03-09 DIAGNOSIS — G709 Myoneural disorder, unspecified: Secondary | ICD-10-CM | POA: Diagnosis not present

## 2015-03-09 DIAGNOSIS — I129 Hypertensive chronic kidney disease with stage 1 through stage 4 chronic kidney disease, or unspecified chronic kidney disease: Secondary | ICD-10-CM | POA: Insufficient documentation

## 2015-03-09 DIAGNOSIS — Z79899 Other long term (current) drug therapy: Secondary | ICD-10-CM | POA: Diagnosis not present

## 2015-03-09 DIAGNOSIS — E785 Hyperlipidemia, unspecified: Secondary | ICD-10-CM | POA: Insufficient documentation

## 2015-03-09 DIAGNOSIS — I2510 Atherosclerotic heart disease of native coronary artery without angina pectoris: Secondary | ICD-10-CM | POA: Diagnosis not present

## 2015-03-09 DIAGNOSIS — M810 Age-related osteoporosis without current pathological fracture: Secondary | ICD-10-CM | POA: Diagnosis not present

## 2015-03-09 DIAGNOSIS — E1122 Type 2 diabetes mellitus with diabetic chronic kidney disease: Secondary | ICD-10-CM | POA: Insufficient documentation

## 2015-03-09 LAB — BASIC METABOLIC PANEL
Anion gap: 11 (ref 5–15)
BUN: 17 mg/dL (ref 6–20)
CO2: 29 mmol/L (ref 22–32)
Calcium: 9.7 mg/dL (ref 8.9–10.3)
Chloride: 98 mmol/L — ABNORMAL LOW (ref 101–111)
Creatinine, Ser: 1.42 mg/dL — ABNORMAL HIGH (ref 0.44–1.00)
GFR calc Af Amer: 39 mL/min — ABNORMAL LOW (ref >60)
GFR calc non Af Amer: 34 mL/min — ABNORMAL LOW (ref >60)
Glucose, Bld: 128 mg/dL — ABNORMAL HIGH (ref 65–99)
Potassium: 3.6 mmol/L (ref 3.5–5.1)
Sodium: 138 mmol/L (ref 135–145)

## 2015-03-09 MED ORDER — LISINOPRIL 10 MG PO TABS: 10.0000 mg | ORAL_TABLET | Freq: Every day | ORAL | Status: DC

## 2015-03-09 MED ORDER — ASPIRIN 81 MG PO TABS: 81.0000 mg | ORAL_TABLET | Freq: Once | ORAL | Status: DC

## 2015-03-09 NOTE — Progress Notes (Signed)
ReDs vest score: 35 

## 2015-03-09 NOTE — Progress Notes (Signed)
ADVANCED HF CLINIC NOTE  Patient ID: Rebecca Hall, female   DOB: 07-Jul-1931, 81 y.o.   MRN: [id number] PCP: Dr Felipa Eth.  Primary HF: Dr Haroldine Laws Oncologist: Dr Jana Hakim  HPI: Rebecca Hall is a 81 y.o. female with history of Breast Cancer treated with 3 doses only of herceptin in 2014, CKD, HTN, HLD, Osteoporosis, GERD, Hx of GI Bleed, and DM.  Ad[facility] to [facility] 8/16 with hypovolemic shock, AKI creatinine 4.7. After fluid resuscitation developed pulmonary edema. Found to have new onset LV dysfunction. Had cath as noted below with 1v CAD with 60 proximal LAD and NICM. Discharged on lasix, carvedilol, and entresto. Discharge weight was 156 pounds.   Seen several weeks ago in clinic and was orthostatic. Given 1L NS and meds cut back. Instructed to resume carvedilol and Entresto slowly. Lasix made prn only.  Now feeling much better. Weight much more stable 151-154. Started back on carvedilol and Entresto then had syncopal episode at beauty shop. Was having diarrhea at that time.  Entresto stopped completely. Remains on carvedilol 3.125 bid. Taking lasix 40/20 and doing well with that. Breathing much better. Dr. Carlyle Lipa office ambulated her off oxygen and sats maintained so O2 stopped. No chest pain but can get tingly sensation across her chest. Comes and goes on occasion. Able to do ADLs and go to grocery store.    [facility] 01/27/2015  RA = 11 RV = 42/8/15 PA = 38/8 (24) PCW = 19 Fick cardiac output/index = 5.2/3.0 Thermo CO/CI = 5.7/3.2 PVR = 1.0 WU FA sat = 93% PA sat = 51%, 51% Ao Pressure: 133/60 (85) LV Pressure: 133/9/17 No aortic stenosis  Assessment: 1) Moderate 1v CAD with 60% proximal LAD lesion 2) Non-ischemic CM with EF 30-35% by echo 3) Mildly elevated R-sided pressures with normal cardiac output  Labs:  K 4.7 Creatinine 1.5   ROS: All systems negative except as listed in HPI, PMH and Problem List.  SH:  Social History   Social History  . Marital Status: Married     Spouse Name: N/A  . Number of Children: N/A  . Years of Education: N/A   Occupational History  . Not on file.   Social History Main Topics  . Smoking status: Never Smoker   . Smokeless tobacco: Never Used  . Alcohol Use: No  . Drug Use: No  . Sexual Activity: Not on file     Comment: first live birth age 18, P 2, Estrogen    Other Topics Concern  . Not on file   Social History Narrative    FH:  Family History  Problem Relation Age of Onset  . Cancer Maternal Aunt 80    colon cancer  . Cancer Maternal Uncle 65    lung  . Cancer Maternal Grandmother     pancreas    Past Medical History  Diagnosis Date  . Hyperlipidemia   . Hypertension   . Chronic kidney disease   . Osteoporosis   . GERD (gastroesophageal reflux disease)   . IBS (irritable bowel syndrome)   . Arthritis   . Neuromuscular disorder     peripheral neuropathy feet  . History of lower GI bleeding   . Breast cancer 03/15/13    right, 12 o'clock  . Diabetes mellitus without complication     Current Outpatient Prescriptions  Medication Sig Dispense Refill  . allopurinol (ZYLOPRIM) 100 MG tablet Take 100 mg by mouth daily. Total of 400mg  daily    . anastrozole (  ARIMIDEX) 1 MG tablet TAKE 1 TABLET (1 MG TOTAL) BY MOUTH DAILY. 90 tablet 3  . aspirin 325 MG tablet Take 325 mg by mouth daily.    Marland Kitchen atorvastatin (LIPITOR) 20 MG tablet Take 20 mg by mouth every morning.     . calcium carbonate (OS-CAL) 600 MG TABS tablet Take 600 mg by mouth 2 (two) times daily with a meal.     . carvedilol (COREG) 3.125 MG tablet Take 1 tablet (3.125 mg total) by mouth 2 (two) times daily with a meal. 180 tablet 3  . diazepam (VALIUM) 5 MG tablet Take 1 tablet (5 mg total) by mouth at bedtime as needed for anxiety. 20 tablet 0  . diphenhydrAMINE (BENADRYL) 25 MG tablet Take 25 mg by mouth every 6 (six) hours as needed for allergies.    . diphenhydramine-acetaminophen (TYLENOL PM) 25-500 MG TABS Take 1 tablet by mouth at  bedtime.     . furosemide (LASIX) 20 MG tablet Take 20 mg by mouth daily. PM    . furosemide (LASIX) 40 MG tablet Take 40 mg by mouth daily. AM    . gabapentin (NEURONTIN) 300 MG capsule TAKE 1 CAPSULE BY MOUTH DAILY AT BEDTIME 90 capsule 1  . glimepiride (AMARYL) 2 MG tablet Take 2 mg by mouth daily with breakfast.    . loratadine (CLARITIN) 10 MG tablet Take 10 mg by mouth daily.    Marland Kitchen LORazepam (ATIVAN) 0.5 MG tablet Take 1 tablet (0.5 mg total) by mouth 2 (two) times daily as needed for anxiety. 30 tablet 0  . minoxidil (ROGAINE) 2 % external solution Apply 1 application topically daily.     . Multiple Vitamin (MULTIVITAMIN WITH MINERALS) TABS tablet Take 1 tablet by mouth 2 (two) times daily.     Marland Kitchen omeprazole (PRILOSEC) 20 MG capsule Take 20 mg by mouth daily.     Marland Kitchen triamcinolone ointment (KENALOG) 0.5 % Apply 1 application topically 2 (two) times daily. 30 g 0  . Vitamin D, Ergocalciferol, (DRISDOL) 50000 UNITS CAPS capsule Take 50,000 Units by mouth every 14 (fourteen) days.     . Zoledronic Acid (ZOMETA) 4 MG/100ML IVPB Inject 4 mg into the vein every 6 (six) months.      No current facility-administered medications for this encounter.    Filed Vitals:   03/09/15 1133  BP: 136/74  Pulse: 73  Weight: 153 lb 12.8 oz (69.763 kg)  SpO2: 95%    PHYSICAL EXAM:  General:  Well appearing. No resp difficulty Husband present.  HEENT: normal Neck: supple. JVP 6-7. Carotids 2+ bilaterally; no bruits. No lymphadenopathy or thryomegaly appreciated. Cor: PMI normal. Regular rate & rhythm. No rubs, gallops or murmurs. Lungs: clear with reduced BS throughout Abdomen: soft, nontender, nondistended. No hepatosplenomegaly. No bruits or masses. Good bowel sounds. Extremities: no cyanosis, clubbing, rash, edema Neuro: alert & orientedx3, cranial nerves grossly intact. Moves all 4 extremities w/o difficulty. Affect pleasant.   ASSESSMENT & PLAN: 1. Chronic  systolic CHF : EF [id number], decreased  from normal in 1/15 - NYHA II-III. Volume status looks good on current regimen. Will continue - It appears her BP cannot tolerate Entresto. She previously was on lisinopril 20mg  daily. Wil resume lisinopril at 10mg  qhs and and see how she tolerates.  - Will check labs today and then in 1 week  - Repeat echo in 2 months to look for LV recovery 2. CKD, Stage 3 - Check BMET now.  3. Hx of Breast Cancer - 3  doses of Herceptin in 12 /14 4. DM2 x 2 years 5. HTN 6. HLD 7. Heavy snoring - probable OSA. Will need sleep study. Set up at next appointment.  8. CAD - 1v. Nonobstructive. Continue statin and ASA. (reduce to 81mg )   F/u 2 months. Place VEST today.   Glori Bickers MD  12:11 PM

## 2015-03-09 NOTE — Addendum Note (Signed)
Encounter addended by: Kerry Dory, CMA on: 03/09/2015 12:40 PM<BR>     Documentation filed: Patient Instructions Section, Dx Association, Orders

## 2015-03-09 NOTE — Patient Instructions (Signed)
START Lisinopril 10 mg, one pill daily at bedtime DECREASE Aspirin to 81 mg, one pill daily  Labs today and again in 1 week (BMET)  Your physician recommends that you schedule a follow-up appointment in: 2 months with a echocardiogram  Your physician has requested that you have an echocardiogram. Echocardiography is a painless test that uses sound waves to create images of your heart. It provides your doctor with information about the size and shape of your heart and how well your heart's chambers and valves are working. This procedure takes approximately one hour. There are no restrictions for this procedure.  Do the following things EVERYDAY: 1) Weigh yourself in the morning before breakfast. Write it down and keep it in a log. 2) Take your medicines as prescribed 3) Eat low salt foods-Limit salt (sodium) to 2000 mg per day.  4) Stay as active as you can everyday 5) Limit all fluids for the day to less than 2 liters 6)

## 2015-03-09 NOTE — Addendum Note (Signed)
Encounter addended by: Scarlette Calico, RN on: 03/09/2015 12:39 PM<BR>     Documentation filed: Notes Section

## 2015-03-15 ENCOUNTER — Ambulatory Visit (HOSPITAL_COMMUNITY)
Admission: RE | Admit: 2015-03-15 | Discharge: 2015-03-15 | Disposition: A | Discharge status: Home / Self Care | Payer: PPO | Source: Ambulatory Visit | Attending: Internal Medicine | Admitting: Internal Medicine

## 2015-03-15 DIAGNOSIS — I5022 Chronic systolic (congestive) heart failure: Secondary | ICD-10-CM

## 2015-03-15 LAB — BASIC METABOLIC PANEL
Anion gap: 9 (ref 5–15)
BUN: 15 mg/dL (ref 6–20)
CO2: 27 mmol/L (ref 22–32)
Calcium: 9.2 mg/dL (ref 8.9–10.3)
Chloride: 105 mmol/L (ref 101–111)
Creatinine, Ser: 1.4 mg/dL — ABNORMAL HIGH (ref 0.44–1.00)
GFR calc Af Amer: 40 mL/min — ABNORMAL LOW (ref >60)
GFR calc non Af Amer: 34 mL/min — ABNORMAL LOW (ref >60)
Glucose, Bld: 221 mg/dL — ABNORMAL HIGH (ref 65–99)
Potassium: 3.6 mmol/L (ref 3.5–5.1)
Sodium: 141 mmol/L (ref 135–145)

## 2015-03-22 ENCOUNTER — Other Ambulatory Visit: Payer: Self-pay

## 2015-03-22 NOTE — Patient Outreach (Signed)
[facility]) Care Management  03/22/2015  RAELIE HIGLEY Sep 24, 1931 [id number]   SUBJECTIVE:  Telephone call to patient.  Patient verbally agreed to telephonic case management follow up.  Patient states she has a history of breast cancer, has been receiving infusions and care by Dr. Jana Hakim. Patient states she has been follow up with Dr. Haroldine Laws for her heart failure.  Patient states she is concerned if she should continue to have infusions with Dr. Jana Hakim if she is having heart failure issues.   Patient states her oxygen has been recently discontinues.  Patient states she continues to take her medications as prescribed by her doctor.  Patient  States she is taking her medication lisinopril right before bed and wants to know if it is ok to take at dinner time.  Patient states her dinner times is around 7pm.  [facility] reports [facility] completed services with her around the beginning of September.  Patient states she has been doing much better and is proud of herself for working so hard to regain her independence.  Patient states she does not have to use her cane anymore.  Patient verbally agreed to follow up telephone outreach with [facility]. RNCM requested patient sign [facility] care management consent form and return in self addressed stamped envelop. Patient verbalized understanding and agreement.  RNCM advised patient to contact Dr. Gillermina Hu  and/ or Dr. Virgie Dad office and speak with nurse regarding concerns about continuing medication infusions and treatment for heart failure.  RNCM contacted Harlow Asa, clinical pharmacist with [facility] care management regarding patients concern regarding taking her lisinopril at dinner.  Advised by Harlow Asa, pharmacist patient to continue to take her lisinopril at the time her doctor advised.  RNCM contacted patient and notified her as advised by Harlow Asa, pharmacist to continue to take her medications at the time her doctor  advised. Patient verbalized understanding and agreement.      ASSESSMENT: [place] referral.  Patient will benefit from telephonic case management follow up for health care navigation and congestive heart failure management and education.    PLAN: RNCM will follow up with patient within 1 week.  RNCM will send patient successful outreach letter and [facility] care management pamphlet with consent.  RNCM will send patient EMMI education material on fall prevention and heart failure.  RNCM will send patients primary MD [facility] Care management involvement letter.   Quinn Plowman RN,BSN,CCM [place] Coordinator [phone]

## 2015-03-29 ENCOUNTER — Other Ambulatory Visit: Payer: Self-pay

## 2015-03-29 NOTE — Patient Outreach (Signed)
[facility]) Care Management  [place]  03/29/2015   Rebecca Hall 1931-09-21 [id number]  Subjective: Telephone call to patient for follow up.  Patient states she is doing well.  Patient reports she contacted Dr. Coralee Pesa office on yesterday 03/28/15 and left message if she should still continue to receive trastuzumab infusion since she has heart failure.  Patient states she is awaiting return call from Dr. Clayborne Dana nurse.  Patient states she has not received her [facility] care management packet/consent/EMMI articles.   Patient reports she still has some swelling in her feet, ankles, and hands. Patient states this is not unusual for her.  Denies swelling getting any worse. Patient states she continues to take her fluid pill as prescribed and elevates her feet during the day as needed.  Patient reports a 1lbs increase in weight... 157lbs today from 156lbs on yesterday. Patient states she feels as though her appetite has improved. Patient verbally agreed to next telephone follow up with RNCM.  RNCM discussed heart failure action plan and goals with patient.  RNCM reviewed signs and symptoms of heart failure.  Advised patient to continue to take medications as prescribed.  RNCM advised patient to contact doctor if experiencing heart failure symptoms.    Objective: see assessment.   Current Medications:  Current Outpatient Prescriptions  Medication Sig Dispense Refill  . allopurinol (ZYLOPRIM) 100 MG tablet Take 100 mg by mouth daily. Total of 400mg  daily    . anastrozole (ARIMIDEX) 1 MG tablet TAKE 1 TABLET (1 MG TOTAL) BY MOUTH DAILY. 90 tablet 3  . aspirin 81 MG tablet Take 1 tablet (81 mg total) by mouth once. 30 tablet 3  . atorvastatin (LIPITOR) 20 MG tablet Take 20 mg by mouth every morning.     . calcium carbonate (OS-CAL) 600 MG TABS tablet Take 600 mg by mouth 2 (two) times daily with a meal.     . carvedilol (COREG) 3.125 MG tablet Take 1 tablet (3.125 mg  total) by mouth 2 (two) times daily with a meal. 180 tablet 3  . diazepam (VALIUM) 5 MG tablet Take 1 tablet (5 mg total) by mouth at bedtime as needed for anxiety. 20 tablet 0  . diphenhydrAMINE (BENADRYL) 25 MG tablet Take 25 mg by mouth every 6 (six) hours as needed for allergies.    . diphenhydramine-acetaminophen (TYLENOL PM) 25-500 MG TABS Take 1 tablet by mouth at bedtime.     . furosemide (LASIX) 20 MG tablet Take 20 mg by mouth daily. PM    . furosemide (LASIX) 40 MG tablet Take 40 mg by mouth daily. AM    . gabapentin (NEURONTIN) 300 MG capsule TAKE 1 CAPSULE BY MOUTH DAILY AT BEDTIME 90 capsule 1  . glimepiride (AMARYL) 2 MG tablet Take 2 mg by mouth daily with breakfast.    . lisinopril (ZESTRIL) 10 MG tablet Take 1 tablet (10 mg total) by mouth at bedtime. 30 tablet 6  . loratadine (CLARITIN) 10 MG tablet Take 10 mg by mouth daily.    . minoxidil (ROGAINE) 2 % external solution Apply 1 application topically daily.     . Multiple Vitamin (MULTIVITAMIN WITH MINERALS) TABS tablet Take 1 tablet by mouth 2 (two) times daily.     Marland Kitchen omeprazole (PRILOSEC) 20 MG capsule Take 20 mg by mouth daily.     . Vitamin D, Ergocalciferol, (DRISDOL) 50000 UNITS CAPS capsule Take 50,000 Units by mouth every 14 (fourteen) days.     . Zoledronic Acid (  ZOMETA) 4 MG/100ML IVPB Inject 4 mg into the vein every 6 (six) months.     Marland Kitchen LORazepam (ATIVAN) 0.5 MG tablet Take 1 tablet (0.5 mg total) by mouth 2 (two) times daily as needed for anxiety. (Patient not taking: Reported on 03/29/2015) 30 tablet 0  . triamcinolone ointment (KENALOG) 0.5 % Apply 1 application topically 2 (two) times daily. (Patient not taking: Reported on 03/29/2015) 30 g 0   No current facility-administered medications for this visit.    Functional Status:  In your present state of health, do you have any difficulty performing the following activities: 03/29/2015 01/20/2015  Hearing? N N  Vision? N N  Difficulty concentrating or making  decisions? N N  Walking or climbing stairs? N N  Dressing or bathing? N Y  Doing errands, shopping? N -  Preparing Food and eating ? N -  Using the Toilet? N -  In the past six months, have you accidently leaked urine? N -  Do you have problems with loss of bowel control? N -  Managing your Medications? N -  Managing your Finances? N -  Housekeeping or managing your Housekeeping? N -    Fall/Depression Screening: PHQ 2/9 Scores 03/29/2015  PHQ - 2 Score 0   Fall Risk  03/29/2015 03/22/2015 02/15/2015 02/07/2015 11/24/2013  Falls in the past year? Yes Yes Yes No No  Number falls in past yr: 1 1 1  - -  Injury with Fall? No No No - -  Risk for fall due to : History of fall(s) Other (Comment) - - -  Risk for fall due to (comments): patient reports if she gets up to quick. patient reports history of syncope. - - -  Follow up Falls prevention discussed;Education provided Falls prevention discussed Falls prevention discussed - -   Assessment: Heart failure.  Assessment in [place] Zone of Heart failure action plan. Patient continues to comply with heart failure treatment plan.   Plan: RNCM will follow up with patient within 3 weeks. Patient will report to [facility] two heart failure action plan zones at next outreach.  Patient will report to [facility] 3 heart failure signs and symptoms at next outreach. Patient will report to [facility] outcome of her discussion with Dr. Clayborne Dana office regarding medication infusion.  Patient will report to [facility] receiving [facility] care management packet and EMMI material Patient will sign consent form once received and return to [facility] care management office.   Quinn Plowman RN,BSN,CCM [place] Coordinator [phone]

## 2015-03-30 ENCOUNTER — Telehealth (HOSPITAL_COMMUNITY): Payer: Self-pay | Admitting: *Deleted

## 2015-03-30 NOTE — Telephone Encounter (Signed)
Patient called to ask DB some questions: Pt is going to going back to get her breast infusions: zyometta is that ok? Does she need an echo she just had one? Is it ok to start traveling? She is suppose to go to [place] in middle January. She would also like to start going to

## 2015-03-30 NOTE — Telephone Encounter (Signed)
She would also like to start going to the [employer] football games, is this ok? She has an appointment in November but wanted approval before then.  She also said that her tingling in her chest and upper arms only last for a few minutes and then goes away.

## 2015-04-03 ENCOUNTER — Telehealth (HOSPITAL_COMMUNITY): Payer: Self-pay | Admitting: Cardiology

## 2015-04-03 NOTE — Telephone Encounter (Signed)
All ok

## 2015-04-03 NOTE — Telephone Encounter (Signed)
Pt left message on triage voicemail requesting return call No answer, left message for pt to return call

## 2015-04-04 ENCOUNTER — Telehealth (HOSPITAL_COMMUNITY): Payer: Self-pay

## 2015-04-04 NOTE — Telephone Encounter (Signed)
Patient given approval for all questions given to Dr. Haroldine Laws.

## 2015-04-14 ENCOUNTER — Other Ambulatory Visit: Payer: Self-pay

## 2015-04-14 DIAGNOSIS — C50411 Malignant neoplasm of upper-outer quadrant of right female breast: Secondary | ICD-10-CM

## 2015-04-17 ENCOUNTER — Ambulatory Visit (HOSPITAL_BASED_OUTPATIENT_CLINIC_OR_DEPARTMENT_OTHER): Payer: PPO

## 2015-04-17 ENCOUNTER — Other Ambulatory Visit: Payer: Self-pay | Admitting: Oncology

## 2015-04-17 ENCOUNTER — Other Ambulatory Visit (HOSPITAL_BASED_OUTPATIENT_CLINIC_OR_DEPARTMENT_OTHER): Payer: PPO

## 2015-04-17 ENCOUNTER — Other Ambulatory Visit: Payer: Self-pay

## 2015-04-17 VITALS — BP 157/57 | HR 58 | Temp 97.7°F | Resp 16

## 2015-04-17 DIAGNOSIS — M810 Age-related osteoporosis without current pathological fracture: Secondary | ICD-10-CM

## 2015-04-17 DIAGNOSIS — M8580 Other specified disorders of bone density and structure, unspecified site: Secondary | ICD-10-CM | POA: Diagnosis not present

## 2015-04-17 DIAGNOSIS — C50411 Malignant neoplasm of upper-outer quadrant of right female breast: Secondary | ICD-10-CM

## 2015-04-17 DIAGNOSIS — Z853 Personal history of malignant neoplasm of breast: Secondary | ICD-10-CM

## 2015-04-17 LAB — COMPREHENSIVE METABOLIC PANEL (CC13)
ALT: 13 U/L (ref 0–55)
AST: 15 U/L (ref 5–34)
Albumin: 3.8 g/dL (ref 3.5–5.0)
Alkaline Phosphatase: 86 U/L (ref 40–150)
Anion Gap: 9 mEq/L (ref 3–11)
BUN: 21.5 mg/dL (ref 7.0–26.0)
CO2: 29 mEq/L (ref 22–29)
Calcium: 10 mg/dL (ref 8.4–10.4)
Chloride: 102 mEq/L (ref 98–109)
Creatinine: 1.5 mg/dL — ABNORMAL HIGH (ref 0.6–1.1)
EGFR: 34 mL/min/{1.73_m2} — ABNORMAL LOW (ref >90)
Glucose: 83 mg/dl (ref 70–140)
Potassium: 3.3 mEq/L — ABNORMAL LOW (ref 3.5–5.1)
Sodium: 140 mEq/L (ref 136–145)
Total Bilirubin: 0.34 mg/dL (ref 0.20–1.20)
Total Protein: 7 g/dL (ref 6.4–8.3)

## 2015-04-17 LAB — CBC WITH DIFFERENTIAL/PLATELET
BASO%: 1.1 % (ref 0.0–2.0)
Basophils Absolute: 0.1 10*3/uL (ref 0.0–0.1)
EOS%: 3.2 % (ref 0.0–7.0)
Eosinophils Absolute: 0.2 10*3/uL (ref 0.0–0.5)
HCT: 31.8 % — ABNORMAL LOW (ref 34.8–46.6)
HGB: 10.5 g/dL — ABNORMAL LOW (ref 11.6–15.9)
LYMPH%: 42.5 % (ref 14.0–49.7)
MCH: 33.6 pg (ref 25.1–34.0)
MCHC: 32.8 g/dL (ref 31.5–36.0)
MCV: 102.2 fL — ABNORMAL HIGH (ref 79.5–101.0)
MONO#: 0.5 10*3/uL (ref 0.1–0.9)
MONO%: 10.7 % (ref 0.0–14.0)
NEUT#: 2 10*3/uL (ref 1.5–6.5)
NEUT%: 42.5 % (ref 38.4–76.8)
Platelets: 162 10*3/uL (ref 145–400)
RBC: 3.12 10*6/uL — ABNORMAL LOW (ref 3.70–5.45)
RDW: 15.1 % — ABNORMAL HIGH (ref 11.2–14.5)
WBC: 4.7 10*3/uL (ref 3.9–10.3)
lymph#: 2 10*3/uL (ref 0.9–3.3)

## 2015-04-17 MED ORDER — ZOLEDRONIC ACID 4 MG/5ML IV CONC
3.0000 mg | Freq: Once | INTRAVENOUS | Status: AC
  Administered 2015-04-17: 3 mg via INTRAVENOUS
  Filled 2015-04-17: qty 3.75

## 2015-04-17 MED ORDER — SODIUM CHLORIDE 0.9 % IV SOLN
Freq: Once | INTRAVENOUS | Status: AC
  Administered 2015-04-17: 15:00:00 via INTRAVENOUS

## 2015-04-17 NOTE — Patient Instructions (Signed)

## 2015-04-21 ENCOUNTER — Other Ambulatory Visit: Payer: Self-pay

## 2015-04-21 ENCOUNTER — Ambulatory Visit: Payer: PPO

## 2015-04-21 NOTE — Patient Outreach (Signed)
[place] [facility]) Care Management  04/21/2015  Rebecca Hall Oct 30, 1931 [id number]  SUBJECTIVE: Telephone call to patient for follow up.  Patient states she is doing very well. Patient states she is a little short of breath at time of this call because she just brought in bags from shopping.  Patient states overall she has not had any issues with shortness of breath. Patient states she does have some swelling in her hands and feet but it is minimal. Patient states this is normal for her and is handled when she takes her lasix. Patient states she is in the Rebecca Hall Heart failure zone.  Patient stated all three colored Heart failure zones but unable to specify symptoms within zones. Patient states She had her infusion done on Monday 04/17/15. Patient states Dr. Tempie Hoist notified her it was okay for her to continue to take her infusion medication. Patient states she  Is unsure when her next follow up appointment is at the heart failure clinic.  Patient states she is due her flu shot.  Patient states she is unsure when she had her last pneumonia vaccine.  Patient states she does not have any further concerns at this time and feels she is doing well.  RNCM reviewed with patient heart failures signs and symptoms and heart failure action plan and zones.  RNCM called heart failure clinic and spoke with Camilia who states patient is on a recall list for her next appointment with Dr. Tempie Hoist.  Camilia states patient will be called with appointment. Patient states she has to look for [facility] care management consent form. Patient aware that consent form must be signed and returned to [facility] care management office.   RNCM reviewed [facility] record for patient and determined patients last pnemonia shot was 03/16/14. RNCM called patient back to notify her of last pneumonia shot and appointment status with heart failure clinic. Patient verbalized appreciation for RNCM's help.  Patient agreed to next telephone outreach with  [facility].    ASSESSMENT: Referral received from community case manager.  Patient has heart failure action plan/zones brochure.   PLAN; RNCM will follow up with patient within 1 month.  RNCM contacted Dr. Edwena Bunde office regarding patients appointment and did chart review to determine patients last pneumonia vaccine. Patient has been notified. Patient will sign consent form and return to [place] management office.    Quinn Plowman RN,BSN,CCM [place] Coordinator [phone]

## 2015-04-28 ENCOUNTER — Ambulatory Visit
Admission: RE | Admit: 2015-04-28 | Discharge: 2015-04-28 | Disposition: A | Discharge status: Home / Self Care | Payer: PPO | Source: Ambulatory Visit | Attending: Oncology | Admitting: Oncology

## 2015-04-28 ENCOUNTER — Other Ambulatory Visit: Payer: Self-pay

## 2015-04-28 DIAGNOSIS — Z853 Personal history of malignant neoplasm of breast: Secondary | ICD-10-CM

## 2015-04-28 NOTE — Patient Outreach (Signed)
[facility]) Care Management  04/28/2015  EDWENA GHAN Jun 20, 1932 [id number]  SUBJECTIVE: Return call to patient. Patient request RNCM send another consent form. Patient states she remembers sending the consent form back to [facility] care management.  Patient aware consent form not received.    PLAN; RNCM will send patient consent form as requested.   Quinn Plowman RN,BSN,CCM [place] Coordinator [phone]

## 2015-05-22 ENCOUNTER — Other Ambulatory Visit: Payer: Self-pay

## 2015-05-22 NOTE — Patient Outreach (Addendum)
[place] [facility]) Care Management  [place]  05/22/2015   Rebecca Hall January 07, 1932 [id number]  Subjective: Telephone call to patient for follow up.  HIPAA confirmed by patient.  Patient states she is doing well.  Patient reports she had some swelling in her feet and hands over the weekend due to the Thanksgiving holiday.  Patient states she took an extra lasix pill over the weekend as her doctor has advised for increased swelling.  Patient states she did not check her weight over the weekend because she went to visit her daughter for the holiday. Patient states her current weight is 158lbs which down 2lbs from a couple of days ago. Patient reports prior to the holiday weekend her weight had remained stable.  Patient denies shortness of breath or abnormal swelling currently.  Patient states she is in the [place] today. Patient states she feels comfortable with managing her heart failure. RNCM reviewed heart failure action plans with patient. Patient reports she has a follow up appointment with Dr. Tempie Hoist on 06/12/15 and follow up at the cancer center for 3 visits in December 2016.  Patient states her next follow up appointment with her primary  MD is 06/23/15.   RNCM inquired of patient if she had received her flu shot.  Patient states she forgot to have her flu shot.  RNCM advised patient to have flu shot at Cancer center appointment or appointment with Dr. Tempie Hoist.  Patient verbalized understanding and agreement.  Patient reports her blood pressure today was 170/79.  Patient states she continues to monitor and record her blood pressures and weights daily. Patient denies any symptoms at this time.  Patient states her most recent blood pressures over the past week have been 144/84, 138/76, 141/79. Patient denies any medication changes since last outreach with RNCM.  Patient states she has not received 2nd consent form in the mail.  Patient verbally agreed to next telephone  outreach with [facility] within 1 month.   Current Medications:  Current Outpatient Prescriptions  Medication Sig Dispense Refill  . allopurinol (ZYLOPRIM) 100 MG tablet Take 100 mg by mouth daily. Total of 400mg  daily    . anastrozole (ARIMIDEX) 1 MG tablet TAKE 1 TABLET BY MOUTH DAILY. 90 tablet 3  . aspirin 81 MG tablet Take 1 tablet (81 mg total) by mouth once. 30 tablet 3  . atorvastatin (LIPITOR) 20 MG tablet Take 20 mg by mouth every morning.     . calcium carbonate (OS-CAL) 600 MG TABS tablet Take 600 mg by mouth 2 (two) times daily with a meal.     . carvedilol (COREG) 3.125 MG tablet Take 1 tablet (3.125 mg total) by mouth 2 (two) times daily with a meal. 180 tablet 3  . diazepam (VALIUM) 5 MG tablet Take 1 tablet (5 mg total) by mouth at bedtime as needed for anxiety. 20 tablet 0  . diphenhydrAMINE (BENADRYL) 25 MG tablet Take 25 mg by mouth every 6 (six) hours as needed for allergies.    . diphenhydramine-acetaminophen (TYLENOL PM) 25-500 MG TABS Take 1 tablet by mouth at bedtime.     . furosemide (LASIX) 20 MG tablet Take 20 mg by mouth daily. PM    . furosemide (LASIX) 40 MG tablet Take 40 mg by mouth daily. AM    . gabapentin (NEURONTIN) 300 MG capsule TAKE 1 CAPSULE BY MOUTH DAILY AT BEDTIME 90 capsule 1  . glimepiride (AMARYL) 2 MG tablet Take 2 mg by mouth daily with  breakfast.    . lisinopril (ZESTRIL) 10 MG tablet Take 1 tablet (10 mg total) by mouth at bedtime. 30 tablet 6  . loratadine (CLARITIN) 10 MG tablet Take 10 mg by mouth daily.    Marland Kitchen LORazepam (ATIVAN) 0.5 MG tablet Take 1 tablet (0.5 mg total) by mouth 2 (two) times daily as needed for anxiety. (Patient not taking: Reported on 03/29/2015) 30 tablet 0  . minoxidil (ROGAINE) 2 % external solution Apply 1 application topically daily.     . Multiple Vitamin (MULTIVITAMIN WITH MINERALS) TABS tablet Take 1 tablet by mouth 2 (two) times daily.     Marland Kitchen omeprazole (PRILOSEC) 20 MG capsule Take 20 mg by mouth daily.     Marland Kitchen  triamcinolone ointment (KENALOG) 0.5 % Apply 1 application topically 2 (two) times daily. (Patient not taking: Reported on 03/29/2015) 30 g 0  . Vitamin D, Ergocalciferol, (DRISDOL) 50000 UNITS CAPS capsule Take 50,000 Units by mouth every 14 (fourteen) days.     . Zoledronic Acid (ZOMETA) 4 MG/100ML IVPB Inject 4 mg into the vein every 6 (six) months.      No current facility-administered medications for this visit.    Functional Status:  In your present state of health, do you have any difficulty performing the following activities: 03/29/2015 01/20/2015  Hearing? N N  Vision? N N  Difficulty concentrating or making decisions? N N  Walking or climbing stairs? N N  Dressing or bathing? N Y  Doing errands, shopping? N -  Preparing Food and eating ? N -  Using the Toilet? N -  In the past six months, have you accidently leaked urine? N -  Do you have problems with loss of bowel control? N -  Managing your Medications? N -  Managing your Finances? N -  Housekeeping or managing your Housekeeping? N -    Fall/Depression Screening: PHQ 2/9 Scores 03/29/2015  PHQ - 2 Score 0    Assessment: Patient continues to self manage. Patient continues to report she is taking her medication as prescribed.  Patient adhered to heart failure action plan over past weekend by taking 1 extra dose of lasix as reported by patient. Patient able to identify her current zone for heart failure action plan.   Plan: RNCM will follow up with patient within 1 month.  Patient will report having flu shot.  Patient will report keeping follow up appointment with Dr. Tempie Hoist at next outreach.  RNCM will send patient EMMI cold and flu education material  RNCM will resend consent form to patient.   Quinn Plowman RN,BSN,CCM [place] Coordinator [phone]

## 2015-06-08 ENCOUNTER — Other Ambulatory Visit: Payer: Self-pay | Admitting: *Deleted

## 2015-06-08 DIAGNOSIS — C50411 Malignant neoplasm of upper-outer quadrant of right female breast: Secondary | ICD-10-CM

## 2015-06-09 ENCOUNTER — Ambulatory Visit (HOSPITAL_BASED_OUTPATIENT_CLINIC_OR_DEPARTMENT_OTHER): Payer: PPO

## 2015-06-09 ENCOUNTER — Telehealth: Payer: Self-pay | Admitting: *Deleted

## 2015-06-09 ENCOUNTER — Other Ambulatory Visit (HOSPITAL_BASED_OUTPATIENT_CLINIC_OR_DEPARTMENT_OTHER): Payer: PPO

## 2015-06-09 ENCOUNTER — Other Ambulatory Visit: Payer: Self-pay | Admitting: *Deleted

## 2015-06-09 DIAGNOSIS — Z23 Encounter for immunization: Secondary | ICD-10-CM

## 2015-06-09 DIAGNOSIS — C50411 Malignant neoplasm of upper-outer quadrant of right female breast: Secondary | ICD-10-CM

## 2015-06-09 LAB — COMPREHENSIVE METABOLIC PANEL
ALT: 15 U/L (ref 0–55)
AST: 16 U/L (ref 5–34)
Albumin: 3.9 g/dL (ref 3.5–5.0)
Alkaline Phosphatase: 85 U/L (ref 40–150)
Anion Gap: 11 mEq/L (ref 3–11)
BUN: 21 mg/dL (ref 7.0–26.0)
CO2: 29 mEq/L (ref 22–29)
Calcium: 9.5 mg/dL (ref 8.4–10.4)
Chloride: 100 mEq/L (ref 98–109)
Creatinine: 1.4 mg/dL — ABNORMAL HIGH (ref 0.6–1.1)
EGFR: 34 mL/min/{1.73_m2} — ABNORMAL LOW (ref >90)
Glucose: 132 mg/dl (ref 70–140)
Potassium: 2.7 mEq/L — CL (ref 3.5–5.1)
Sodium: 141 mEq/L (ref 136–145)
Total Bilirubin: 0.36 mg/dL (ref 0.20–1.20)
Total Protein: 7.2 g/dL (ref 6.4–8.3)

## 2015-06-09 LAB — CBC WITH DIFFERENTIAL/PLATELET
BASO%: 0.7 % (ref 0.0–2.0)
Basophils Absolute: 0 10*3/uL (ref 0.0–0.1)
EOS%: 4.3 % (ref 0.0–7.0)
Eosinophils Absolute: 0.2 10*3/uL (ref 0.0–0.5)
HCT: 32.5 % — ABNORMAL LOW (ref 34.8–46.6)
HGB: 10.4 g/dL — ABNORMAL LOW (ref 11.6–15.9)
LYMPH%: 38.2 % (ref 14.0–49.7)
MCH: 31.7 pg (ref 25.1–34.0)
MCHC: 32.1 g/dL (ref 31.5–36.0)
MCV: 98.7 fL (ref 79.5–101.0)
MONO#: 0.5 10*3/uL (ref 0.1–0.9)
MONO%: 9.6 % (ref 0.0–14.0)
NEUT#: 2.3 10*3/uL (ref 1.5–6.5)
NEUT%: 47.2 % (ref 38.4–76.8)
Platelets: 174 10*3/uL (ref 145–400)
RBC: 3.29 10*6/uL — ABNORMAL LOW (ref 3.70–5.45)
RDW: 14.5 % (ref 11.2–14.5)
WBC: 4.9 10*3/uL (ref 3.9–10.3)
lymph#: 1.9 10*3/uL (ref 0.9–3.3)

## 2015-06-09 MED ORDER — INFLUENZA VAC SPLIT QUAD 0.5 ML IM SUSY
0.5000 mL | PREFILLED_SYRINGE | Freq: Once | INTRAMUSCULAR | Status: AC
  Administered 2015-06-09: 0.5 mL via INTRAMUSCULAR
  Filled 2015-06-09: qty 0.5

## 2015-06-09 NOTE — Telephone Encounter (Signed)
Noted potassium of 2.7 per lab result today at 315pm.  Noted per med list pt is on lasix but no potassium.  Lasix prescribed by Dr Haroldine Laws.  Contacted pt and discussed- pt without complaints of cardiac concerns at this time.  Rebecca Hall states " when I was discharged from the hospital in August I was told not to take it "  Pt does have potassium 10 mEq in her home.  Per MD review recommended for pt to take 40 mEq this pm then 20 mEq daily and follow up with Dr Haroldine Laws.  Above discussed with pt with verbalized understanding including appointments already scheduled with Dr Haroldine Laws for next week.  Per discussion pt also verbalized understanding to proceed to the ER or call 911 if cardiac concern occur.

## 2015-06-11 NOTE — Progress Notes (Signed)
This encounter was created in error - please disregard.

## 2015-06-12 ENCOUNTER — Other Ambulatory Visit (HOSPITAL_COMMUNITY): Payer: Self-pay | Admitting: *Deleted

## 2015-06-12 ENCOUNTER — Ambulatory Visit (HOSPITAL_BASED_OUTPATIENT_CLINIC_OR_DEPARTMENT_OTHER)
Admission: RE | Admit: 2015-06-12 | Discharge: 2015-06-12 | Disposition: A | Discharge status: Home / Self Care | Payer: PPO | Source: Ambulatory Visit | Attending: Internal Medicine | Admitting: Internal Medicine

## 2015-06-12 ENCOUNTER — Ambulatory Visit (HOSPITAL_COMMUNITY)
Admission: RE | Admit: 2015-06-12 | Discharge: 2015-06-12 | Disposition: A | Discharge status: Home / Self Care | Payer: PPO | Source: Ambulatory Visit | Attending: Geriatric Medicine | Admitting: Geriatric Medicine

## 2015-06-12 VITALS — BP 138/67 | HR 71 | Ht 62.0 in | Wt 161.4 lb

## 2015-06-12 DIAGNOSIS — I5022 Chronic systolic (congestive) heart failure: Secondary | ICD-10-CM

## 2015-06-12 DIAGNOSIS — E119 Type 2 diabetes mellitus without complications: Secondary | ICD-10-CM | POA: Diagnosis not present

## 2015-06-12 DIAGNOSIS — I509 Heart failure, unspecified: Secondary | ICD-10-CM | POA: Diagnosis present

## 2015-06-12 DIAGNOSIS — I071 Rheumatic tricuspid insufficiency: Secondary | ICD-10-CM | POA: Insufficient documentation

## 2015-06-12 LAB — BASIC METABOLIC PANEL
Anion gap: 11 (ref 5–15)
BUN: 15 mg/dL (ref 6–20)
CO2: 29 mmol/L (ref 22–32)
Calcium: 9.7 mg/dL (ref 8.9–10.3)
Chloride: 100 mmol/L — ABNORMAL LOW (ref 101–111)
Creatinine, Ser: 1.24 mg/dL — ABNORMAL HIGH (ref 0.44–1.00)
GFR calc Af Amer: 46 mL/min — ABNORMAL LOW (ref >60)
GFR calc non Af Amer: 40 mL/min — ABNORMAL LOW (ref >60)
Glucose, Bld: 138 mg/dL — ABNORMAL HIGH (ref 65–99)
Potassium: 2.9 mmol/L — ABNORMAL LOW (ref 3.5–5.1)
Sodium: 140 mmol/L (ref 135–145)

## 2015-06-12 MED ORDER — LISINOPRIL 10 MG PO TABS: 10.0000 mg | ORAL_TABLET | Freq: Every day | ORAL | Status: DC

## 2015-06-12 MED ORDER — FUROSEMIDE 40 MG PO TABS: 40.0000 mg | ORAL_TABLET | Freq: Every day | ORAL | Status: DC

## 2015-06-12 MED ORDER — FUROSEMIDE 20 MG PO TABS: 20.0000 mg | ORAL_TABLET | Freq: Every day | ORAL | Status: DC

## 2015-06-12 MED ORDER — SPIRONOLACTONE 25 MG PO TABS
12.5000 mg | ORAL_TABLET | Freq: Every day | ORAL | Status: DC
Start: 2015-06-12 — End: 2015-07-02

## 2015-06-12 NOTE — Patient Instructions (Signed)
Start Spironolactone 12.5 mg (1/2 tab) daily  Labs today  Labs in 1 week  We will contact you in 4 months to schedule your next appointment.

## 2015-06-12 NOTE — Progress Notes (Signed)
Echocardiogram 2D Echocardiogram has been performed.  Rebecca Hall 06/12/2015, 2:01 PM

## 2015-06-12 NOTE — Progress Notes (Signed)
Patient ID: Rebecca Hall, female   DOB: 1932/03/27, 81 y.o.   MRN: [id number]   ADVANCED HF CLINIC NOTE  Patient ID: Rebecca Hall, female   DOB: 16-Oct-1931, 81 y.o.   MRN: [id number] PCP: Dr Felipa Eth.  Primary HF: Dr Haroldine Laws Oncologist: Dr Jana Hakim  HPI: Rebecca Hall is a 81 y.o. female with history of Breast Cancer treated with 3 doses only of herceptin in 2014, CKD, HTN, HLD, Osteoporosis, GERD, Hx of GI Bleed, and DM.  Admitted to [facility] 8/16 with hypovolemic shock, AKI creatinine 4.7. After fluid resuscitation developed pulmonary edema. Found to have new onset LV dysfunction. Had cath as noted below with 1v CAD with 60 proximal LAD and NICM. Discharged on lasix, carvedilol, and entresto. Discharge weight was 156 pounds.   She returns today for regular follow up. At last visit placed back on Lisinopril after failing Entresto 2/2 hypotension and syncope. She is up 8 lbs since last visit.  Is taking lasix 40/20 and has been taking an extra 20 at times which seemed to help.  K very low recently. K supped by Dr Jana Hakim office. Pt forgot to take yesterday. Dizziness has been better, though still occasion.  Has check BP during these times, and SBP in the 100s. No syncopal or falling.  Weight at home stable 158 - 160 for the 5-6 weeks.  Had been 155-158 prior to that.  Is having trouble getting wedding rings on. Hasn't had any SOB. Can walk grocery store and [place] without stopping if she uses a buggy. No orthopnea. Has a pain in her chest at times when lying down, "feels like a shock." that lasts for a few seconds and then goes away. Mostly related to lying down or sitting up. Not related to exertion. Has chronic R arm pain, Initially saw someone about it, but has not had follow up.    [facility] 01/27/2015  RA = 11 RV = 42/8/15 PA = 38/8 (24) PCW = 19 Fick cardiac output/index = 5.2/3.0 Thermo CO/CI = 5.7/3.2 PVR = 1.0 WU FA sat = 93% PA sat = 51%, 51% Ao Pressure: 133/60 (85) LV Pressure:  133/9/17 No aortic stenosis  Assessment: 1) Moderate 1v CAD with 60% proximal LAD lesion 2) Non-ischemic CM with EF 30-35% by echo 3) Mildly elevated R-sided pressures with normal cardiac output  Labs:  06/09/15 K 2.7, Creatinine 1.4   ROS: All systems negative except as listed in HPI, PMH and Problem List.  SH:  Social History   Social History  . Marital Status: Married    Spouse Name: N/A  . Number of Children: N/A  . Years of Education: N/A   Occupational History  . Not on file.   Social History Main Topics  . Smoking status: Never Smoker   . Smokeless tobacco: Never Used  . Alcohol Use: No  . Drug Use: No  . Sexual Activity: Not on file     Comment: first live birth age 64, P 2, Estrogen    Other Topics Concern  . Not on file   Social History Narrative    FH:  Family History  Problem Relation Age of Onset  . Cancer Maternal Aunt 80    colon cancer  . Cancer Maternal Uncle 65    lung  . Cancer Maternal Grandmother     pancreas  . Cerebral aneurysm Father     Past Medical History  Diagnosis Date  . Hyperlipidemia   . Hypertension   .  Chronic kidney disease   . Osteoporosis   . GERD (gastroesophageal reflux disease)   . IBS (irritable bowel syndrome)   . Arthritis   . Neuromuscular disorder ([place])     peripheral neuropathy feet  . History of lower GI bleeding   . Breast cancer ([place]) 03/15/13    right, 12 o'clock  . Diabetes mellitus without complication [facility])     Current Outpatient Prescriptions  Medication Sig Dispense Refill  . allopurinol (ZYLOPRIM) 100 MG tablet Take 100 mg by mouth daily. Take with 300 mg tablet for a total of 400mg  daily    . allopurinol (ZYLOPRIM) 300 MG tablet Take 300 mg by mouth daily. Take with 100 mg tablet for a total of 400mg  daily    . anastrozole (ARIMIDEX) 1 MG tablet TAKE 1 TABLET BY MOUTH DAILY. 90 tablet 3  . aspirin 81 MG tablet Take 81 mg by mouth daily.    Marland Kitchen atorvastatin (LIPITOR) 20 MG tablet Take 20 mg  by mouth every morning.     . calcium carbonate (OS-CAL) 600 MG TABS tablet Take 600 mg by mouth 2 (two) times daily with a meal.     . carvedilol (COREG) 3.125 MG tablet Take 1 tablet (3.125 mg total) by mouth 2 (two) times daily with a meal. 180 tablet 3  . diazepam (VALIUM) 5 MG tablet Take 1 tablet (5 mg total) by mouth at bedtime as needed for anxiety. 20 tablet 0  . diphenhydramine-acetaminophen (TYLENOL PM) 25-500 MG TABS Take 1 tablet by mouth at bedtime.     . furosemide (LASIX) 20 MG tablet Take 20 mg by mouth daily. PM    . furosemide (LASIX) 40 MG tablet Take 40 mg by mouth daily. AM    . gabapentin (NEURONTIN) 300 MG capsule TAKE 1 CAPSULE BY MOUTH DAILY AT BEDTIME 90 capsule 1  . glimepiride (AMARYL) 2 MG tablet Take 2 mg by mouth daily with breakfast.    . lisinopril (ZESTRIL) 10 MG tablet Take 1 tablet (10 mg total) by mouth at bedtime. 30 tablet 6  . loratadine (CLARITIN) 10 MG tablet Take 10 mg by mouth daily.    . minoxidil (ROGAINE) 2 % external solution Apply 1 application topically daily.     . Multiple Vitamin (MULTIVITAMIN WITH MINERALS) TABS tablet Take 1 tablet by mouth 2 (two) times daily.     Marland Kitchen omeprazole (PRILOSEC) 20 MG capsule Take 20 mg by mouth daily.     . potassium chloride (K-DUR) 10 MEQ tablet Take 20 mEq by mouth 2 (two) times daily.    . Vitamin D, Ergocalciferol, (DRISDOL) 50000 UNITS CAPS capsule Take 50,000 Units by mouth every 14 (fourteen) days.     . vitamin E 100 UNIT capsule Take 100 Units by mouth daily.    . Zoledronic Acid (ZOMETA) 4 MG/100ML IVPB Inject 4 mg into the vein every 6 (six) months.      No current facility-administered medications for this encounter.    Filed Vitals:   06/12/15 1413  BP: 138/67  Pulse: 71  Height: 5\' 2"  (1.575 m)  Weight: 161 lb 6.4 oz (73.211 kg)  SpO2: 99%   Wt Readings from Last 3 Encounters:  06/12/15 161 lb 6.4 oz (73.211 kg)  05/22/15 158 lb (71.668 kg)  04/21/15 156 lb (70.761 kg)    PHYSICAL  EXAM:  General:  Well appearing. NAD. Husband present.  HEENT: normal Neck: supple. JVP 7-8. Carotids 2+ bilaterally; no bruits. No thyromegaly or nodule noted.  Cor: PMI normal. RRR. No M/G/R Lungs: CTAB, normal effort Abdomen: soft, NT, ND, no HSM. No bruits or masses. +BS  Extremities: no cyanosis, clubbing, rash. Trace to 1+ LE edema L>R with varicosities. Neuro: alert & orientedx3, cranial nerves grossly intact. Moves all 4 extremities w/o difficulty. Affect pleasant.   ASSESSMENT & PLAN: 1. Chronic  systolic CHF : EF 99991111, decreased from normal in 1/15 - Echo today read as 55-60%. - NYHA II-III. Volume status mildly elevated but stable per home weights.  - Continue lisinopril 10 mg. She does not tolerate Entresto with hypotension and syncopal episode.  - Will add spiro 12.5 mg daily and check BMET. Will adjust K supp based on that.  2. CKD, Stage 3 - Check BMET.  3. Hx of Breast Cancer - 3 doses of Herceptin in 12 /14 4. DM2 x 2 years 5. HTN 6. HLD 7. Heavy snoring - Has improved per husband.  Will hold off sleep study until they get back from their travels.  8. CAD - 1v. Nonobstructive. Continue statin and ASA 81mg )  9. Hypokalemia - has been supped. Now adding spiro. Repeat BMET today.  BMET today with repeat next week. Folllow up in 4 months   Satira Mccallum Tillery PA-C 2:40 PM  Patient seen and examined with Oda Kilts, PA-C. We discussed all aspects of the encounter. I agree with the assessment and plan as stated above.   Echo images reviewed personally and EF now back to normal. BP slightly elevated and has been requiring extra lasix on regular basis so will add spiro 12.5 mg daily. This should also help with hypokalemia. Will check BMET today. Stressed need to f/u on probable OSA.   Bensimhon, Daniel,MD 7:20 PM

## 2015-06-12 NOTE — Progress Notes (Signed)
Advanced Heart Failure Medication Review by a Pharmacist  Does the patient  feel that his/her medications are working for him/her?  yes  Has the patient been experiencing any side effects to the medications prescribed?  no  Does the patient measure his/her own blood pressure or blood glucose at home?  yes   Does the patient have any problems obtaining medications due to transportation or finances?   no  Understanding of regimen: good Understanding of indications: good Potential of compliance: good Patient understands to avoid NSAIDs. Patient understands to avoid decongestants.  Issues to address at subsequent visits: None   Pharmacist comments:  Rebecca Hall is a pleasant 81 yo F presenting with her husband and a current medication list. She reports good compliance with her medications and states that she recently restarted her potassium supplementation on 12/16 but forgot to take them yesterday. She did not have any specific medication-related questions or concerns for me at this time.   Rebecca Hall. Velva Harman, PharmD, BCPS, CPP Clinical Pharmacist Pager: [phone] Phone: [phone] 06/12/2015 2:41 PM      Time with patient: 8 minutes Preparation and documentation time: 2 minutes Total time: 10 minutes

## 2015-06-12 NOTE — Telephone Encounter (Signed)
No entry 

## 2015-06-13 ENCOUNTER — Other Ambulatory Visit: Payer: Medicare Other

## 2015-06-13 ENCOUNTER — Ambulatory Visit (HOSPITAL_BASED_OUTPATIENT_CLINIC_OR_DEPARTMENT_OTHER): Payer: PPO | Admitting: Oncology

## 2015-06-13 ENCOUNTER — Telehealth: Payer: Self-pay | Admitting: Oncology

## 2015-06-13 ENCOUNTER — Other Ambulatory Visit: Payer: Self-pay | Admitting: Oncology

## 2015-06-13 ENCOUNTER — Ambulatory Visit: Payer: Medicare Other | Admitting: Nurse Practitioner

## 2015-06-13 ENCOUNTER — Ambulatory Visit (HOSPITAL_COMMUNITY)
Admission: RE | Admit: 2015-06-13 | Discharge: 2015-06-13 | Disposition: A | Discharge status: Home / Self Care | Payer: PPO | Source: Ambulatory Visit | Attending: Oncology | Admitting: Oncology

## 2015-06-13 ENCOUNTER — Ambulatory Visit: Payer: Medicare Other | Admitting: Oncology

## 2015-06-13 VITALS — BP 150/63 | HR 71 | Temp 97.5°F | Resp 18 | Ht 62.0 in | Wt 163.2 lb

## 2015-06-13 DIAGNOSIS — Z170 Estrogen receptor positive status [ER+]: Secondary | ICD-10-CM | POA: Diagnosis not present

## 2015-06-13 DIAGNOSIS — C50411 Malignant neoplasm of upper-outer quadrant of right female breast: Secondary | ICD-10-CM | POA: Insufficient documentation

## 2015-06-13 DIAGNOSIS — M8580 Other specified disorders of bone density and structure, unspecified site: Secondary | ICD-10-CM

## 2015-06-13 DIAGNOSIS — Z79811 Long term (current) use of aromatase inhibitors: Secondary | ICD-10-CM

## 2015-06-13 DIAGNOSIS — I509 Heart failure, unspecified: Secondary | ICD-10-CM

## 2015-06-13 DIAGNOSIS — C50911 Malignant neoplasm of unspecified site of right female breast: Secondary | ICD-10-CM

## 2015-06-13 DIAGNOSIS — M810 Age-related osteoporosis without current pathological fracture: Secondary | ICD-10-CM

## 2015-06-13 MED ORDER — ANASTROZOLE 1 MG PO TABS: 1.0000 mg | ORAL_TABLET | Freq: Every day | ORAL | Status: DC

## 2015-06-13 NOTE — Telephone Encounter (Signed)
Appointments made and avs printed for patient °

## 2015-06-13 NOTE — Progress Notes (Signed)
ID: Rebecca Hall OB: 1932/02/12  MR#: [id number]  KGY#:[id number]  PCP: Rebecca Argyle, MD GYN:   SU: Rebecca Hall OTHER MD: Rebecca Hall, Rebecca Hall, Rebecca Hall  CHIEF COMPLAINT:  Estrogen receptor positive Breast Cancer  CURRENT TREATMENT: Anastrozole   HISTORY OF PRESENT ILLNESS: From the original intake note:  Rebecca Hall had routine screening mammography at [facility] 03/04/2013 showing a possible mass in the right breast, measuring 4 mm in diameter. Additional views 03/10/2013 confirmed an area of asymmetry in the right breast which was hypoechoic by ultrasonography. Biopsy of this area 03/15/2013 showed ([id number]) and invasive ductal carcinoma, grade 1 or 2, estrogen receptor 100% positive, progesterone receptor 100% positive, with a proliferation marker of 30% and HER-2 amplification by CISH with a ratio of 3.51 and an average HER-2 copy number of 6.85.  On 04/05/2013 the patient underwent bilateral breast MRIs. This showed in the right breast an 8 mm mass at the site of the known biopsy site, with surrounding known masslike enhancement. The entire area of concern measured 1.9 cm. There were no abnormal appearing lymph nodes of concern. In the left breast there were 2 areas of clumped non-masslike enhancement measuring 0.5 cm and 2.4 cm.  The patient's subsequent history is as detailed below  INTERVAL HISTORY: Rebecca Hall returns today for follow-up of her breast cancer accompanied by her husband Rebecca Hall. Since her last visit here she had a prolonged admission for sepsis and severe congestive heart failure, with an ejection fraction in the 30% range. She was treated aggressively by cardiology (see Dr. Clayborne Hall note) and echocardiogram yesterday showed a nicely recovered ejection fraction in the 60-65% range.  She continues on anastrozole, which was not interrupted. She tolerates this well. She does have hot flashes, but no vaginal dryness. She obtains it at a good  price.  REVIEW OF SYSTEMS: Rebecca Hall was found to be significant hypokalemic yesterday she has replace that since and those labs were not repeated today. She complains of stress urinary incontinence. She can develop a dry skin rash at times. She has back and joint pain particularly involving the right shoulder and right upper arm areas. Sometimes she has sciatica going down the right leg as well but that is currently better. Hot flashes are moderate. A detailed review of systems today was otherwise stable.   PAST MEDICAL HISTORY: Past Medical History  Diagnosis Date  . Hyperlipidemia   . Hypertension   . Chronic kidney disease   . Osteoporosis   . GERD (gastroesophageal reflux disease)   . IBS (irritable bowel syndrome)   . Arthritis   . Neuromuscular disorder ([place])     peripheral neuropathy feet  . History of lower GI bleeding   . Breast cancer (Rebecca Hall) 03/15/13    right, 12 o'clock  . Diabetes mellitus without complication ([place])     PAST SURGICAL HISTORY: Past Surgical History  Procedure Laterality Date  . Cataract extraction  2009    both  . Skin cancer excision      head, face, hand ..multiple years  . Abdominal hysterectomy  1975    No salpingo-oophorectomy  . Tonsillectomy    . Colonoscopy    . Breast lumpectomy with needle localization and axillary sentinel lymph node bx Right 04/28/2013    Procedure: BREAST LUMPECTOMY WITH NEEDLE LOCALIZATION AND AXILLARY SENTINEL LYMPH NODE BX;  Surgeon: Rebecca Bookbinder, MD;  Location: [facility];  Service: General;  Laterality: Right;  . Cardiac catheterization N/A 01/26/2015    Procedure: Right/Left  Heart Cath and Coronary Angiography;  Surgeon: Rebecca Artist, MD;  Location: [facility];  Service: Cardiovascular;  Laterality: N/A;    FAMILY HISTORY Family History  Problem Relation Age of Onset  . Cancer Maternal Aunt 80    colon cancer  . Cancer Maternal Uncle 65    lung  . Cancer Maternal Grandmother      pancreas  . Cerebral aneurysm Father    the patient's father died at the age of 33 from a subarachnoid hemorrhage. The patient's mother lived to be and 15.73 years old. She lived independently to be and. The patient had one brother, no sisters. There is a history of colon cancer or in a maternal aunts, diagnosed at age 62, and lung cancer in a maternal uncle diagnosed at age 95. There is no history of breast or ovarian cancer in the family  GYNECOLOGIC HISTORY:  (updated December 2000 410)  [name] can't remember when she had her first period. First live birth came at age 57 and she is [name] 2. She she took hormone replacement, namely estrogen, until 2014 when she was diagnosed with breast cancer.  SOCIAL HISTORY:  (Updated December 2014. 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 Her husband Rebecca Hall is retired, he is largely disabled secondary to back problems following a fall. Daughter Rebecca Hall is a retired Pharmacist, hospital living in [place]. Rebecca Hall's older son runs a Engineer, civil (consulting) and business in [place] and Rebecca Hall helps out with that. The patient's son Rebecca Hall lives in [place]. He is an Chief Financial Officer. Altogether [name] has 4 grandsons, 3 great-grandsons and one great-granddaughter    ADVANCED DIRECTIVES: In place   HEALTH MAINTENANCE:  (Updated January 2015) Social History  Substance Use Topics  . Smoking status: Never Smoker   . Smokeless tobacco: Never Used  . Alcohol Use: No     Colonoscopy: 2011  PAP: Status post hysterectomy  Bone density:Dec 2014, [facility], osteopenia with T score of -1.9  Lipid panel: Not on file/Dr. Maxwell Hall   Allergies  Allergen Reactions  . Levaquin [Levofloxacin In D5w] Anaphylaxis and Nausea Only    Shaking real bad on medicaton  . Codeine Nausea And Vomiting    Pass out  . Herceptin [Trastuzumab] Rash    Patient has developed extensive body skin rash following each Herceptin treatment of increased presence  following each treatment  that cleared with oral steroids  . Sulfa Antibiotics Swelling    Current Outpatient Prescriptions  Medication Sig Dispense Refill  . allopurinol (ZYLOPRIM) 100 MG tablet Take 100 mg by mouth daily. Take with 300 mg tablet for a total of 455m daily    . allopurinol (ZYLOPRIM) 300 MG tablet Take 300 mg by mouth daily. Take with 100 mg tablet for a total of 4044mdaily    . anastrozole (ARIMIDEX) 1 MG tablet TAKE 1 TABLET BY MOUTH DAILY. 90 tablet 3  . aspirin 81 MG tablet Take 81 mg by mouth daily.    . Marland Kitchentorvastatin (LIPITOR) 20 MG tablet Take 20 mg by mouth every morning.     . calcium carbonate (OS-CAL) 600 MG TABS tablet Take 600 mg by mouth 2 (two) times daily with a meal.     . carvedilol (COREG) 3.125 MG tablet Take 1 tablet (3.125 mg total) by mouth 2 (two) times daily with a meal. 180 tablet 3  . diazepam (VALIUM) 5 MG tablet Take 1 tablet (5 mg total) by mouth  at bedtime as needed for anxiety. 20 tablet 0  . diphenhydramine-acetaminophen (TYLENOL PM) 25-500 MG TABS Take 1 tablet by mouth at bedtime.     . furosemide (LASIX) 20 MG tablet Take 1 tablet (20 mg total) by mouth daily. PM 90 tablet 3  . furosemide (LASIX) 40 MG tablet Take 1 tablet (40 mg total) by mouth daily. AM 90 tablet 3  . gabapentin (NEURONTIN) 300 MG capsule TAKE 1 CAPSULE BY MOUTH DAILY AT BEDTIME 90 capsule 1  . glimepiride (AMARYL) 2 MG tablet Take 2 mg by mouth daily with breakfast.    . lisinopril (ZESTRIL) 10 MG tablet Take 1 tablet (10 mg total) by mouth at bedtime. 90 tablet 3  . loratadine (CLARITIN) 10 MG tablet Take 10 mg by mouth daily.    . minoxidil (ROGAINE) 2 % external solution Apply 1 application topically daily.     . Multiple Vitamin (MULTIVITAMIN WITH MINERALS) TABS tablet Take 1 tablet by mouth 2 (two) times daily.     Marland Kitchen omeprazole (PRILOSEC) 20 MG capsule Take 20 mg by mouth daily.     . potassium chloride (K-DUR) 10 MEQ tablet Take 20 mEq by mouth 2 (two) times  daily.    Marland Kitchen spironolactone (ALDACTONE) 25 MG tablet Take 0.5 tablets (12.5 mg total) by mouth daily. 45 tablet 3  . Vitamin D, Ergocalciferol, (DRISDOL) 50000 UNITS CAPS capsule Take 50,000 Units by mouth every 14 (fourteen) days.     . vitamin E 100 UNIT capsule Take 100 Units by mouth daily.    . Zoledronic Acid (ZOMETA) 4 MG/100ML IVPB Inject 4 mg into the vein every 6 (six) months.      No current facility-administered medications for this visit.    OBJECTIVE: Older white woman who appears stated age 81 Vitals:   06/13/15 1509  BP: 150/63  Pulse: 71  Temp: 97.5 F (36.4 C)  Resp: 18     Body mass index is 29.84 kg/(m^2).    ECOG FS:1 - Symptomatic but completely ambulatory Filed Weights   06/13/15 1509  Weight: 163 lb 3.2 oz (74.027 kg)   Sclerae unicteric, pupils round and equal Oropharynx clear and moist-- no thrush or other lesions No cervical or supraclavicular adenopathy Lungs no rales or rhonchi Heart regular rate and rhythm, no murmur appreciated Abd soft, obese, nontender, positive bowel sounds MSK no focal spinal tenderness, good range of motion right upper extremity, no right upper extremity lymphedema Neuro: nonfocal, well oriented, appropriate affect Breasts: The right breast is status post lumpectomy. There is no evidence of local recurrence. The right axilla is benign per the left breast is unremarkable.   LAB RESULTS:   Lab Results  Component Value Date   WBC 4.9 06/09/2015   NEUTROABS 2.3 06/09/2015   HGB 10.4* 06/09/2015   HCT 32.5* 06/09/2015   MCV 98.7 06/09/2015   PLT 174 06/09/2015      Chemistry      Component Value Date/Time   NA 140 06/12/2015 1530   NA 141 06/09/2015 1433   K 2.9* 06/12/2015 1530   K 2.7 Repeated and Verified* 06/09/2015 1433   CL 100* 06/12/2015 1530   CO2 29 06/12/2015 1530   CO2 29 06/09/2015 1433   BUN 15 06/12/2015 1530   BUN 21.0 06/09/2015 1433   CREATININE 1.24* 06/12/2015 1530   CREATININE 1.4*  06/09/2015 1433      Component Value Date/Time   CALCIUM 9.7 06/12/2015 1530   CALCIUM 9.5 06/09/2015 1433  ALKPHOS 85 06/09/2015 1433   ALKPHOS 53 01/19/2015 1700   AST 16 06/09/2015 1433   AST 23 01/19/2015 1700   ALT 15 06/09/2015 1433   ALT 21 01/19/2015 1700   BILITOT 0.36 06/09/2015 1433   BILITOT 0.3 01/19/2015 1700       STUDIES: Transthoracic Echocardiography  Patient:  Rebecca Hall, Rebecca Hall MR #:    [id number] Study Date: 06/12/2015 Gender:   F Age:    65 Height:   154.9 cm Weight:   72.6 kg BSA:    1.79 m^2 Pt. Status: Room:  ATTENDING  [name] [id number] ORDERING   Hall, Glynis Smiles  Glori Bickers SONOGRAPHER Jimmy Reel, RDCS PERFORMING  [facility], Outpatient  cc:  ------------------------------------------------------------------- LV EF: 55% -  60%   Patient tells me she had a bone density test at [facility] (Dr. Nancee Liter office) in December 2014. This showed osteopenia with a T score of -1.9.  ASSESSMENT: 81 y.o. [place] woman, status post right breast biopsy 03/15/2013 for a clinical T1c N0, stage IA invasive ductal carcinoma, grade 1 or 2, estrogen receptor 100% positive, progesterone receptor 100% positive, with an MIB-1 of 30%, and HER-2 amplified by CISH, with a ratio of 3.51 and an average HER-2 copy number Damita Lack of 6.85  (1) biopsy of 2 suspicious areas in the left breast both showed no evidence of malignancy  (2) right lumpectomy and sentinel lymph node sampling 04/28/2013 showed a pT1b pN0, stage IA invasive ductal carcinoma, grade 2, with negative margins  (a) did not need radiation therapy  (3) trastuzumab started 05/31/2013, to be given every 3 weeks for total of 9 doses; held as of 07/16/2013 after 3 doses because of possible allergic dermatitis associated with the drug.   (4) anastrozole started 05/08/2013  (a) dexa scan 06/10/2013 showed osteopenia with a T score of -2.1  (b) zolendronate  started 09/13/2013, repeated 04/17/2015  (c) switched to denosumab/Prolia as of April 2017  (5) congestive heart failure in the setting of sepsis August 2016, with normal repeat echo December 2016   PLAN: Anjalina has made a remarkable recovery as far as her congestive heart failure is concerned. She is tolerating the cardiac meds well. She is followed closely by Dr. Haroldine Laws.  As far as her breast cancer is concerned, she continues on anastrozole, with good tolerance. The plan will be to continue anastrozole for 5 years. She does have osteoporosis, and we had her on zolendronate every 6 months. However given her chronic kidney injury we are going to switch her to Denosumab with the April dose. She is aware of the possible toxicities, side effects and complications of this agent which are essentially similar to those of zolendronate.  She was concerned that she might have cancer in her bones involving the right shoulder and humerus. I reassured her that this was very unlikely and that she probably has bursitis or tendinitis in those areas. Nevertheless we are going to obtain some plain films on the way out today to reassure her further.  She will return to see me in April 2017. She knows to call for any problems that may develop before her next visit here.    Chauncey Cruel, MD   06/13/2015 3:42 PM

## 2015-06-15 ENCOUNTER — Telehealth: Payer: Self-pay | Admitting: *Deleted

## 2015-06-15 ENCOUNTER — Other Ambulatory Visit: Payer: Self-pay

## 2015-06-15 DIAGNOSIS — M810 Age-related osteoporosis without current pathological fracture: Secondary | ICD-10-CM

## 2015-06-15 DIAGNOSIS — C50411 Malignant neoplasm of upper-outer quadrant of right female breast: Secondary | ICD-10-CM

## 2015-06-15 DIAGNOSIS — M8580 Other specified disorders of bone density and structure, unspecified site: Secondary | ICD-10-CM

## 2015-06-15 NOTE — Telephone Encounter (Signed)
TC from patient requesting results of right arm /shoulder xrays done on 06/13/15 and what follow up she needs.

## 2015-06-15 NOTE — Patient Outreach (Signed)
[facility]) Care Management  06/15/2015  Rebecca Hall [birth year] [id number]  Telephone call to patient regarding heart failure follow up.  Unable to reach patient. HIPAA compliant voice message left with call back phone.   PLAN; RNCM will attempt 2nd telephone outreach within 1 week.  Quinn Plowman RN,BSN,CCM [place] Coordinator [phone]

## 2015-06-15 NOTE — Telephone Encounter (Signed)
Writer returned patient's call and discussed patient's results from the shoulder xray.  Per Dr. Jana Hakim there is no cancer in the right shoulder but she has significant arthritis.  Dr. Jana Hakim would like patient to see Dr. Justice Britain at [facility].  Writer printed a referral and faxed recent xrays to Dr. Susie Cassette office. Patient states that her should is so bad that she cannot lift her arm above her head and she welcomes the referral hoping that she might get an injection to get some relief.

## 2015-06-21 ENCOUNTER — Telehealth (HOSPITAL_COMMUNITY): Payer: Self-pay | Admitting: *Deleted

## 2015-06-21 MED ORDER — POTASSIUM CHLORIDE ER 10 MEQ PO TBCR: 40.0000 meq | EXTENDED_RELEASE_TABLET | Freq: Two times a day (BID) | ORAL | Status: DC

## 2015-06-21 NOTE — Telephone Encounter (Signed)
-----   Message from Jolaine Artist, MD sent at 06/14/2015  5:25 PM EST ----- Potassium remains low. Please increase kcl to 40 bid. Arlyce Harman also added so need to follow closely. Get BMET weekly x 3 weeks.

## 2015-06-21 NOTE — Telephone Encounter (Signed)
Notes Recorded by Scarlette Calico, RN on 06/21/2015 at 3:02 PM Pt aware and agreeable, she will have labs at pcp office

## 2015-06-30 ENCOUNTER — Encounter (HOSPITAL_COMMUNITY): Payer: Self-pay | Admitting: Emergency Medicine

## 2015-06-30 ENCOUNTER — Inpatient Hospital Stay (HOSPITAL_COMMUNITY): Payer: PPO

## 2015-06-30 ENCOUNTER — Inpatient Hospital Stay (HOSPITAL_COMMUNITY)
Admission: EM | Admit: 2015-06-30 | Discharge: 2015-07-02 | DRG: 683 | Disposition: A | Discharge status: Home / Self Care | Payer: PPO | Attending: Internal Medicine | Admitting: Internal Medicine

## 2015-06-30 ENCOUNTER — Telehealth (HOSPITAL_COMMUNITY): Payer: Self-pay | Admitting: *Deleted

## 2015-06-30 DIAGNOSIS — C50919 Malignant neoplasm of unspecified site of unspecified female breast: Secondary | ICD-10-CM | POA: Diagnosis not present

## 2015-06-30 DIAGNOSIS — M810 Age-related osteoporosis without current pathological fracture: Secondary | ICD-10-CM | POA: Diagnosis present

## 2015-06-30 DIAGNOSIS — I110 Hypertensive heart disease with heart failure: Secondary | ICD-10-CM | POA: Diagnosis not present

## 2015-06-30 DIAGNOSIS — I5042 Chronic combined systolic (congestive) and diastolic (congestive) heart failure: Secondary | ICD-10-CM | POA: Diagnosis not present

## 2015-06-30 DIAGNOSIS — I2510 Atherosclerotic heart disease of native coronary artery without angina pectoris: Secondary | ICD-10-CM | POA: Diagnosis not present

## 2015-06-30 DIAGNOSIS — E872 Acidosis, unspecified: Secondary | ICD-10-CM | POA: Diagnosis present

## 2015-06-30 DIAGNOSIS — N179 Acute kidney failure, unspecified: Secondary | ICD-10-CM | POA: Diagnosis present

## 2015-06-30 DIAGNOSIS — Z79899 Other long term (current) drug therapy: Secondary | ICD-10-CM | POA: Diagnosis not present

## 2015-06-30 DIAGNOSIS — N183 Chronic kidney disease, stage 3 unspecified: Secondary | ICD-10-CM | POA: Diagnosis present

## 2015-06-30 DIAGNOSIS — I130 Hypertensive heart and chronic kidney disease with heart failure and stage 1 through stage 4 chronic kidney disease, or unspecified chronic kidney disease: Secondary | ICD-10-CM | POA: Diagnosis present

## 2015-06-30 DIAGNOSIS — R197 Diarrhea, unspecified: Secondary | ICD-10-CM

## 2015-06-30 DIAGNOSIS — Z794 Long term (current) use of insulin: Secondary | ICD-10-CM | POA: Diagnosis not present

## 2015-06-30 DIAGNOSIS — I959 Hypotension, unspecified: Secondary | ICD-10-CM | POA: Diagnosis not present

## 2015-06-30 DIAGNOSIS — E875 Hyperkalemia: Secondary | ICD-10-CM | POA: Diagnosis not present

## 2015-06-30 DIAGNOSIS — E785 Hyperlipidemia, unspecified: Secondary | ICD-10-CM | POA: Diagnosis present

## 2015-06-30 DIAGNOSIS — I509 Heart failure, unspecified: Secondary | ICD-10-CM

## 2015-06-30 DIAGNOSIS — I951 Orthostatic hypotension: Secondary | ICD-10-CM | POA: Diagnosis not present

## 2015-06-30 DIAGNOSIS — E1129 Type 2 diabetes mellitus with other diabetic kidney complication: Secondary | ICD-10-CM | POA: Diagnosis not present

## 2015-06-30 DIAGNOSIS — Z888 Allergy status to other drugs, medicaments and biological substances status: Secondary | ICD-10-CM

## 2015-06-30 DIAGNOSIS — Z7982 Long term (current) use of aspirin: Secondary | ICD-10-CM | POA: Diagnosis not present

## 2015-06-30 DIAGNOSIS — C50411 Malignant neoplasm of upper-outer quadrant of right female breast: Secondary | ICD-10-CM | POA: Diagnosis present

## 2015-06-30 DIAGNOSIS — I5022 Chronic systolic (congestive) heart failure: Secondary | ICD-10-CM | POA: Diagnosis not present

## 2015-06-30 DIAGNOSIS — Z882 Allergy status to sulfonamides status: Secondary | ICD-10-CM | POA: Diagnosis not present

## 2015-06-30 DIAGNOSIS — E860 Dehydration: Secondary | ICD-10-CM | POA: Diagnosis not present

## 2015-06-30 DIAGNOSIS — Z885 Allergy status to narcotic agent status: Secondary | ICD-10-CM | POA: Diagnosis not present

## 2015-06-30 DIAGNOSIS — Z881 Allergy status to other antibiotic agents status: Secondary | ICD-10-CM | POA: Diagnosis not present

## 2015-06-30 DIAGNOSIS — K580 Irritable bowel syndrome with diarrhea: Secondary | ICD-10-CM | POA: Diagnosis present

## 2015-06-30 DIAGNOSIS — Z170 Estrogen receptor positive status [ER+]: Secondary | ICD-10-CM | POA: Diagnosis present

## 2015-06-30 DIAGNOSIS — R42 Dizziness and giddiness: Secondary | ICD-10-CM | POA: Diagnosis not present

## 2015-06-30 DIAGNOSIS — E119 Type 2 diabetes mellitus without complications: Secondary | ICD-10-CM | POA: Diagnosis not present

## 2015-06-30 LAB — I-STAT CHEM 8, ED
BUN: 53 mg/dL — ABNORMAL HIGH (ref 6–20)
Calcium, Ion: 1.19 mmol/L (ref 1.13–1.30)
Chloride: 103 mmol/L (ref 101–111)
Creatinine, Ser: 3.7 mg/dL — ABNORMAL HIGH (ref 0.44–1.00)
Glucose, Bld: 214 mg/dL — ABNORMAL HIGH (ref 65–99)
HCT: 39 % (ref 36.0–46.0)
Hemoglobin: 13.3 g/dL (ref 12.0–15.0)
Potassium: 7.1 mmol/L (ref 3.5–5.1)
Sodium: 131 mmol/L — ABNORMAL LOW (ref 135–145)
TCO2: 20 mmol/L (ref 0–100)

## 2015-06-30 LAB — BASIC METABOLIC PANEL
Anion gap: 13 (ref 5–15)
BUN: 53 mg/dL — ABNORMAL HIGH (ref 6–20)
CO2: 18 mmol/L — ABNORMAL LOW (ref 22–32)
Calcium: 9.8 mg/dL (ref 8.9–10.3)
Chloride: 101 mmol/L (ref 101–111)
Creatinine, Ser: 3.69 mg/dL — ABNORMAL HIGH (ref 0.44–1.00)
GFR calc Af Amer: 12 mL/min — ABNORMAL LOW (ref >60)
GFR calc non Af Amer: 11 mL/min — ABNORMAL LOW (ref >60)
Glucose, Bld: 224 mg/dL — ABNORMAL HIGH (ref 65–99)
Potassium: 7.4 mmol/L (ref 3.5–5.1)
Sodium: 132 mmol/L — ABNORMAL LOW (ref 135–145)

## 2015-06-30 LAB — CBC
HCT: 35.6 % — ABNORMAL LOW (ref 36.0–46.0)
Hemoglobin: 11.5 g/dL — ABNORMAL LOW (ref 12.0–15.0)
MCH: 32 pg (ref 26.0–34.0)
MCHC: 32.3 g/dL (ref 30.0–36.0)
MCV: 99.2 fL (ref 78.0–100.0)
Platelets: 189 10*3/uL (ref 150–400)
RBC: 3.59 MIL/uL — ABNORMAL LOW (ref 3.87–5.11)
RDW: 14.4 % (ref 11.5–15.5)
WBC: 7.1 10*3/uL (ref 4.0–10.5)

## 2015-06-30 LAB — I-STAT TROPONIN, ED: Troponin i, poc: 0 ng/mL (ref 0.00–0.08)

## 2015-06-30 LAB — I-STAT CG4 LACTIC ACID, ED
Lactic Acid, Venous: 1.49 mmol/L (ref 0.5–2.0)
Lactic Acid, Venous: 1.03 mmol/L (ref 0.5–2.0)

## 2015-06-30 MED ORDER — INSULIN ASPART 100 UNIT/ML IV SOLN
5.0000 [IU] | Freq: Once | INTRAVENOUS | Status: AC
  Administered 2015-06-30: 5 [IU] via INTRAVENOUS
  Filled 2015-06-30: qty 1

## 2015-06-30 MED ORDER — GABAPENTIN 300 MG PO CAPS
300.0000 mg | ORAL_CAPSULE | Freq: Every day | ORAL | Status: DC
  Administered 2015-07-01 (×2): 300 mg via ORAL
  Filled 2015-06-30 (×2): qty 1

## 2015-06-30 MED ORDER — ATORVASTATIN CALCIUM 20 MG PO TABS
20.0000 mg | ORAL_TABLET | Freq: Every morning | ORAL | Status: DC
  Administered 2015-07-01 – 2015-07-02 (×2): 20 mg via ORAL
  Filled 2015-06-30 (×2): qty 1

## 2015-06-30 MED ORDER — SODIUM CHLORIDE 0.9 % IV SOLN
1.0000 g | Freq: Once | INTRAVENOUS | Status: AC
  Administered 2015-06-30: 1 g via INTRAVENOUS
  Filled 2015-06-30: qty 10

## 2015-06-30 MED ORDER — ENOXAPARIN SODIUM 30 MG/0.3ML ~~LOC~~ SOLN
30.0000 mg | Freq: Every day | SUBCUTANEOUS | Status: DC
  Administered 2015-07-01 – 2015-07-02 (×2): 30 mg via SUBCUTANEOUS
  Filled 2015-06-30 (×2): qty 0.3

## 2015-06-30 MED ORDER — SODIUM CHLORIDE 0.9 % IV BOLUS (SEPSIS): 500.0000 mL | Freq: Once | INTRAVENOUS | Status: DC

## 2015-06-30 MED ORDER — ANASTROZOLE 1 MG PO TABS
1.0000 mg | ORAL_TABLET | Freq: Every day | ORAL | Status: DC
  Administered 2015-07-01 – 2015-07-02 (×2): 1 mg via ORAL
  Filled 2015-06-30 (×2): qty 1

## 2015-06-30 MED ORDER — SODIUM CHLORIDE 0.9 % IJ SOLN
3.0000 mL | Freq: Two times a day (BID) | INTRAMUSCULAR | Status: DC
  Administered 2015-07-01 – 2015-07-02 (×4): 3 mL via INTRAVENOUS

## 2015-06-30 MED ORDER — SODIUM CHLORIDE 0.9 % IV BOLUS (SEPSIS)
1000.0000 mL | Freq: Once | INTRAVENOUS | Status: AC
  Administered 2015-06-30: 1000 mL via INTRAVENOUS

## 2015-06-30 MED ORDER — SODIUM BICARBONATE 8.4 % IV SOLN
50.0000 meq | Freq: Once | INTRAVENOUS | Status: AC
  Administered 2015-06-30: 50 meq via INTRAVENOUS
  Filled 2015-06-30: qty 50

## 2015-06-30 MED ORDER — ASPIRIN EC 81 MG PO TBEC
81.0000 mg | DELAYED_RELEASE_TABLET | Freq: Every day | ORAL | Status: DC
  Administered 2015-07-01 – 2015-07-02 (×2): 81 mg via ORAL
  Filled 2015-06-30 (×2): qty 1

## 2015-06-30 MED ORDER — LORATADINE 10 MG PO TABS
10.0000 mg | ORAL_TABLET | Freq: Every day | ORAL | Status: DC
  Administered 2015-07-01 – 2015-07-02 (×2): 10 mg via ORAL
  Filled 2015-06-30 (×2): qty 1

## 2015-06-30 MED ORDER — INSULIN ASPART 100 UNIT/ML ~~LOC~~ SOLN: 0.0000 [IU] | Freq: Every day | SUBCUTANEOUS | Status: DC

## 2015-06-30 MED ORDER — DEXTROSE 50 % IV SOLN
1.0000 | Freq: Once | INTRAVENOUS | Status: AC
  Administered 2015-06-30: 50 mL via INTRAVENOUS
  Filled 2015-06-30: qty 50

## 2015-06-30 MED ORDER — INSULIN ASPART 100 UNIT/ML ~~LOC~~ SOLN
0.0000 [IU] | Freq: Three times a day (TID) | SUBCUTANEOUS | Status: DC
  Administered 2015-07-01: 3 [IU] via SUBCUTANEOUS
  Administered 2015-07-01: 1 [IU] via SUBCUTANEOUS
  Administered 2015-07-01: 3 [IU] via SUBCUTANEOUS

## 2015-06-30 MED ORDER — ALBUTEROL SULFATE (2.5 MG/3ML) 0.083% IN NEBU
10.0000 mg | INHALATION_SOLUTION | Freq: Once | RESPIRATORY_TRACT | Status: AC
  Administered 2015-06-30: 10 mg via RESPIRATORY_TRACT
  Filled 2015-06-30: qty 12

## 2015-06-30 MED ORDER — SODIUM POLYSTYRENE SULFONATE 15 GM/60ML PO SUSP
30.0000 g | ORAL | Status: AC
  Administered 2015-06-30 – 2015-07-01 (×2): 30 g via ORAL
  Filled 2015-06-30: qty 120

## 2015-06-30 NOTE — Progress Notes (Signed)
Called for report.RN will call back shortly. Assisting patient with meds

## 2015-06-30 NOTE — ED Provider Notes (Signed)
CSN: [id number]     Arrival date & time 06/30/15  1822 History   First MD Initiated Contact with Patient 06/30/15 1932     Chief Complaint  Patient presents with  . Abnormal Lab  . Diarrhea     (Consider location/radiation/quality/duration/timing/severity/associated sxs/prior Treatment) Patient is a 82 y.o. female presenting with general illness. The history is provided by the patient.  Illness Severity:  Moderate Onset quality:  Gradual Duration:  2 days Timing:  Constant Progression:  Worsening Chronicity:  New Associated symptoms: diarrhea and fatigue   Associated symptoms: no chest pain, no congestion, no fever, no headaches, no myalgias, no nausea, no rhinorrhea, no shortness of breath, no vomiting and no wheezing     82 yo F with a chief complaints of fatigue and malaise. This been going on for the past couple days. Today the patient also had diarrhea for most of the day. Four bowel movements denies bloody or dark.Patient saw her family physician earlier in the day who found that she was profoundly hyperkalemic. Patient was told to come to the emergency department. Patient is also been having some presyncopal feeling when she stands up.  Past Medical History  Diagnosis Date  . Hyperlipidemia   . Hypertension   . Chronic kidney disease   . Osteoporosis   . GERD (gastroesophageal reflux disease)   . IBS (irritable bowel syndrome)   . Arthritis   . Neuromuscular disorder ([place])     peripheral neuropathy feet  . History of lower GI bleeding   . Breast cancer ([place]) 03/15/13    right, 12 o'clock  . Diabetes mellitus without complication [facility])    Past Surgical History  Procedure Laterality Date  . Cataract extraction  2009    both  . Skin cancer excision      head, face, hand ..multiple years  . Abdominal hysterectomy  1975    No salpingo-oophorectomy  . Tonsillectomy    . Colonoscopy    . Breast lumpectomy with needle localization and axillary sentinel lymph node bx  Right 04/28/2013    Procedure: BREAST LUMPECTOMY WITH NEEDLE LOCALIZATION AND AXILLARY SENTINEL LYMPH NODE BX;  Surgeon: Rolm Bookbinder, MD;  Location: [facility];  Service: General;  Laterality: Right;  . Cardiac catheterization N/A 01/26/2015    Procedure: Right/Left Heart Cath and Coronary Angiography;  Surgeon: Jolaine Artist, MD;  Location: [place] [facility];  Service: Cardiovascular;  Laterality: N/A;   Family History  Problem Relation Age of Onset  . Cancer Maternal Aunt 80    colon cancer  . Cancer Maternal Uncle 65    lung  . Cancer Maternal Grandmother     pancreas  . Cerebral aneurysm Father    Social History  Substance Use Topics  . Smoking status: Never Smoker   . Smokeless tobacco: Never Used  . Alcohol Use: No   OB History    No data available     Review of Systems  Constitutional: Positive for fatigue. Negative for fever and chills.  HENT: Negative for congestion and rhinorrhea.   Eyes: Negative for redness and visual disturbance.  Respiratory: Negative for shortness of breath and wheezing.   Cardiovascular: Negative for chest pain and palpitations.  Gastrointestinal: Positive for diarrhea. Negative for nausea and vomiting.  Genitourinary: Negative for dysuria and urgency.  Musculoskeletal: Negative for myalgias and arthralgias.  Skin: Negative for pallor and wound.  Neurological: Negative for dizziness and headaches.      Allergies  Levaquin; Codeine;  Herceptin; and Sulfa antibiotics  Home Medications   Prior to Admission medications   Medication Sig Start Date End Date Taking? Authorizing Provider  allopurinol (ZYLOPRIM) 100 MG tablet Take 100 mg by mouth daily. Take with 300 mg tablet for a total of 400mg  daily 02/05/13   Historical Provider, MD  anastrozole (ARIMIDEX) 1 MG tablet Take 1 tablet (1 mg total) by mouth daily. 06/13/15   Chauncey Cruel, MD  aspirin 81 MG tablet Take 81 mg by mouth daily.    Historical Provider, MD   atorvastatin (LIPITOR) 20 MG tablet Take 20 mg by mouth every morning.  02/23/13   Historical Provider, MD  calcium carbonate (OS-CAL) 600 MG TABS tablet Take 600 mg by mouth 2 (two) times daily with a meal.     Historical Provider, MD  carvedilol (COREG) 3.125 MG tablet Take 1 tablet (3.125 mg total) by mouth 2 (two) times daily with a meal. 03/03/15   Jolaine Artist, MD  diazepam (VALIUM) 5 MG tablet Take 1 tablet (5 mg total) by mouth at bedtime as needed for anxiety. 01/07/14   Chauncey Cruel, MD  diphenhydramine-acetaminophen (TYLENOL PM) 25-500 MG TABS Take 1 tablet by mouth at bedtime.     Historical Provider, MD  furosemide (LASIX) 20 MG tablet Take 1 tablet (20 mg total) by mouth daily. PM 06/12/15   Jolaine Artist, MD  furosemide (LASIX) 40 MG tablet Take 1 tablet (40 mg total) by mouth daily. AM 06/12/15   Jolaine Artist, MD  gabapentin (NEURONTIN) 300 MG capsule TAKE 1 CAPSULE BY MOUTH DAILY AT BEDTIME 12/27/14   Chauncey Cruel, MD  glimepiride (AMARYL) 2 MG tablet Take 2 mg by mouth daily with breakfast.    Historical Provider, MD  lisinopril (ZESTRIL) 10 MG tablet Take 1 tablet (10 mg total) by mouth at bedtime. 06/12/15   Jolaine Artist, MD  loratadine (CLARITIN) 10 MG tablet Take 10 mg by mouth daily.    Historical Provider, MD  minoxidil (ROGAINE) 2 % external solution Apply 1 application topically daily.     Historical Provider, MD  Multiple Vitamin (MULTIVITAMIN WITH MINERALS) TABS tablet Take 1 tablet by mouth 2 (two) times daily.     Historical Provider, MD  omeprazole (PRILOSEC) 20 MG capsule Take 20 mg by mouth daily.  03/08/13   Historical Provider, MD  potassium chloride (K-DUR) 10 MEQ tablet Take 4 tablets (40 mEq total) by mouth 2 (two) times daily. 06/21/15   Jolaine Artist, MD  spironolactone (ALDACTONE) 25 MG tablet Take 0.5 tablets (12.5 mg total) by mouth daily. 06/12/15   Jolaine Artist, MD  Vitamin D, Ergocalciferol, (DRISDOL) 50000 UNITS CAPS  capsule Take 50,000 Units by mouth every 14 (fourteen) days.     Historical Provider, MD  vitamin E 100 UNIT capsule Take 100 Units by mouth daily.    Historical Provider, MD   BP 84/50 mmHg  Pulse 83  Temp(Src) 97.6 F (36.4 C) (Oral)  Resp 18  SpO2 99% Physical Exam  Constitutional: She is oriented to person, place, and time. She appears well-developed and well-nourished. No distress.  Dry mucous membranes  HENT:  Head: Normocephalic and atraumatic.  Eyes: EOM are normal. Pupils are equal, round, and reactive to light.  Neck: Normal range of motion. Neck supple.  Cardiovascular: Normal rate and regular rhythm.  Exam reveals no gallop and no friction rub.   No murmur heard. Pulmonary/Chest: Effort normal. She has no wheezes. She has  no rales.  Abdominal: Soft. She exhibits no distension. There is no tenderness. There is no rebound and no guarding.  Musculoskeletal: She exhibits no edema or tenderness.  Neurological: She is alert and oriented to person, place, and time.  Skin: Skin is warm and dry. She is not diaphoretic.  Psychiatric: She has a normal mood and affect. Her behavior is normal.  Nursing note and vitals reviewed.   ED Course  Procedures (including critical care time) Labs Review Labs Reviewed  BASIC METABOLIC PANEL  [place], ED    Imaging Review No results found. I have personally reviewed and evaluated these images and lab results as part of my medical decision-making.   EKG Interpretation   Date/Time:  Friday June 30 2015 19:19:29 EST Ventricular Rate:  83 PR Interval:  210 QRS Duration: 122 QT Interval:  354 QTC Calculation: 415 R Axis:   -62 Text Interpretation:  Sinus rhythm with 1st degree A-V block with Fusion  complexes Left axis deviation Left ventricular hypertrophy with QRS  widening and repolarization abnormality Cannot rule out Septal infarct ,  age undetermined Abnormal ECG No significant change since last tracing   Confirmed by Ting Cage MD, Quillian Quince [id number]) on 06/30/2015 7:32:05 PM      MDM   Final diagnoses:  CHF (congestive heart failure) (HCC)  Hyperkalemia  Hypotension, unspecified hypotension type  Dehydration  Diarrhea, unspecified type    82 yo F with a chief complaint of weakness. Found to have profound hyperkalemia as well as acute kidney injury. EKG without any specific findings for hyperkalemia, however with level will treat. Patient was given insulin dextrose high-dose albuterol and calcium. Patient had soft blood pressure on arrival that improved with a liter bolus. Hospitalist admission.  CRITICAL CARE Performed by: Cecilio Asper   Total critical care time: 40 minutes  Critical care time was exclusive of separately billable procedures and treating other patients.  Critical care was necessary to treat or prevent imminent or life-threatening deterioration.  Critical care was time spent personally by me on the following activities: development of treatment plan with patient and/or surrogate as well as nursing, discussions with consultants, evaluation of patient's response to treatment, examination of patient, obtaining history from patient or surrogate, ordering and performing treatments and interventions, ordering and review of laboratory studies, ordering and review of radiographic studies, pulse oximetry and re-evaluation of patient's condition.     Deno Etienne, DO 07/01/15 1443

## 2015-06-30 NOTE — Telephone Encounter (Signed)
Pt reports she already stopped spiro a few days ago and dizziness has not really got any better Labs repeated with PCP on 06/30/2015, patient requested a copy to be faxed to CHF clinic Report not available, will look for labs and review further with provider if needed

## 2015-06-30 NOTE — ED Notes (Signed)
Patient here with complaint of "not feeling well". Was seen by PCP and was found to have high potassium level reading @ 7.3. Also reports diarrhea today. Hx of hypokalemia with supplementation and poor control.

## 2015-06-30 NOTE — Progress Notes (Signed)
Albuterol given. RN aware of the dosage and how it may affect Heart Rate. Pt tolerating it well at this time. No distress or complications noted.

## 2015-06-30 NOTE — H&P (Signed)
History and Physical  Patient Name: Rebecca Hall     [id number]    DOB: January 24, 1932    DOA: 06/30/2015 Referring physician: Deno Etienne, MD PCP: Mathews Argyle, MD      Chief Complaint: Weakness, dizziness  HPI: Rebecca Hall is a 82 y.o. female with a past medical history significant for CKD stage III baseline Cr 1.3, NIDDM, Breast CA on anastrozole, CAD and episode of NICM last summer who presents with hypotension and AKI.  Three weeks ago the patient started spironolactone for her CHF, but never got her follow-up BMP. Now, in the last 3 days she has had diarrhea, then malaise, presyncope and orthostasis, oliguria, leg cramps, and low blood pressure. So she went to her PCP who found her potassium >7 and sent her to the ER.    In the ED, she had Cr 3.69 mg/dL, K 7.4 mmol/L, bicarb 18, and SBP 85-100 mmHg.  She had no respiratory distress.  Insulin, D50, calcium gluc, and albuterol were administered and TRH were asked to evaluate for admission.  Of note, the patient had a similar presentation last July: she presented several days of nausea and vomiting followed by orthostatic symptoms was found to be hypotensive and with AKI (serum creatinine >4).  During that hospitalization her blood pressure did not rise with 4 L of fluids, she was briefly on pressors, and then she developed pulmonary edema. An echocardiogram subsequently showed a new EF 30% and she was diuresed. A L/RHC at the time showed minimal CAD, and it was thought to be a NICM.  Since then she has been followed by Dr. Missy Sabins, and interestingly her EF last month was 55-60% without valvular disease.  Her dry weight usually fluctuates between 155 and 163 and has been 154 last 3 days.   Review of Systems:  Pt complains of weakness, orthostasis, presyncope, oliguria, leg cramps, low blood pressure, low weight, and ankles, malaise, diarrhea. Pt denies any dyspnea, orthopnea, dyspnea on exertion, NSAID use.  All other systems  negative except as just noted or noted in the history of present illness.  Allergies  Allergen Reactions  . Levaquin [Levofloxacin In D5w] Anaphylaxis and Nausea Only    Shaking real bad on medicaton  . Codeine Nausea And Vomiting    Pass out  . Herceptin [Trastuzumab] Rash    Patient has developed extensive body skin rash following each Herceptin treatment of increased presence following each treatment  that cleared with oral steroids  . Sulfa Antibiotics Swelling    Prior to Admission medications   Medication Sig Start Date End Date Taking? Authorizing Provider  allopurinol (ZYLOPRIM) 100 MG tablet Take 100 mg by mouth See admin instructions. Take with 300 mg tablet for a total of 400mg  daily 02/05/13  Yes Historical Provider, MD  anastrozole (ARIMIDEX) 1 MG tablet Take 1 tablet (1 mg total) by mouth daily. 06/13/15  Yes Chauncey Cruel, MD  aspirin 81 MG tablet Take 81 mg by mouth daily.   Yes Historical Provider, MD  atorvastatin (LIPITOR) 20 MG tablet Take 20 mg by mouth every morning.  02/23/13  Yes Historical Provider, MD  calcium carbonate (OS-CAL) 600 MG TABS tablet Take 600 mg by mouth 2 (two) times daily with a meal.    Yes Historical Provider, MD  carvedilol (COREG) 3.125 MG tablet Take 1 tablet (3.125 mg total) by mouth 2 (two) times daily with a meal. 03/03/15  Yes Jolaine Artist, MD  furosemide (LASIX) 20 MG tablet Take  1 tablet (20 mg total) by mouth daily. PM 06/12/15  Yes Jolaine Artist, MD  furosemide (LASIX) 40 MG tablet Take 1 tablet (40 mg total) by mouth daily. AM 06/12/15  Yes Jolaine Artist, MD  gabapentin (NEURONTIN) 300 MG capsule TAKE 1 CAPSULE BY MOUTH DAILY AT BEDTIME 12/27/14  Yes Chauncey Cruel, MD  glimepiride (AMARYL) 2 MG tablet Take 2 mg by mouth daily with breakfast.   Yes Historical Provider, MD  lisinopril (ZESTRIL) 10 MG tablet Take 1 tablet (10 mg total) by mouth at bedtime. 06/12/15  Yes Jolaine Artist, MD  loratadine (CLARITIN) 10 MG  tablet Take 10 mg by mouth daily.   Yes Historical Provider, MD  minoxidil (ROGAINE) 2 % external solution Apply 1 application topically daily.    Yes Historical Provider, MD  Multiple Vitamin (MULTIVITAMIN WITH MINERALS) TABS tablet Take 1 tablet by mouth 2 (two) times daily.    Yes Historical Provider, MD  omeprazole (PRILOSEC) 20 MG capsule Take 20 mg by mouth daily.  03/08/13  Yes Historical Provider, MD  potassium chloride (K-DUR) 10 MEQ tablet Take 4 tablets (40 mEq total) by mouth 2 (two) times daily. 06/21/15  Yes Jolaine Artist, MD  spironolactone (ALDACTONE) 25 MG tablet Take 0.5 tablets (12.5 mg total) by mouth daily. 06/12/15  Yes Jolaine Artist, MD  Vitamin D, Ergocalciferol, (DRISDOL) 50000 UNITS CAPS capsule Take 50,000 Units by mouth every 14 (fourteen) days.    Yes Historical Provider, MD  vitamin E 100 UNIT capsule Take 100 Units by mouth daily.   Yes Historical Provider, MD  diazepam (VALIUM) 5 MG tablet Take 1 tablet (5 mg total) by mouth at bedtime as needed for anxiety. Patient not taking: Reported on 06/30/2015 01/07/14   Chauncey Cruel, MD    Past Medical History  Diagnosis Date  . Hyperlipidemia   . Hypertension   . Chronic kidney disease   . Osteoporosis   . GERD (gastroesophageal reflux disease)   . IBS (irritable bowel syndrome)   . Arthritis   . Neuromuscular disorder ([place])     peripheral neuropathy feet  . History of lower GI bleeding   . Breast cancer ([place]) 03/15/13    right, 12 o'clock  . Diabetes mellitus without complication [facility])     Past Surgical History  Procedure Laterality Date  . Cataract extraction  2009    both  . Skin cancer excision      head, face, hand ..multiple years  . Abdominal hysterectomy  1975    No salpingo-oophorectomy  . Tonsillectomy    . Colonoscopy    . Breast lumpectomy with needle localization and axillary sentinel lymph node bx Right 04/28/2013    Procedure: BREAST LUMPECTOMY WITH NEEDLE LOCALIZATION AND  AXILLARY SENTINEL LYMPH NODE BX;  Surgeon: Rolm Bookbinder, MD;  Location: [place];  Service: General;  Laterality: Right;  . Cardiac catheterization N/A 01/26/2015    Procedure: Right/Left Heart Cath and Coronary Angiography;  Surgeon: Jolaine Artist, MD;  Location: [place] [facility];  Service: Cardiovascular;  Laterality: N/A;    Family history: family history includes Cancer in her maternal grandmother; Cancer (age of onset: 2) in her maternal uncle; Cancer (age of onset: 1) in her maternal aunt; Cerebral aneurysm in her father.  Social History: Patient lives with her husband, who is partially disabled. She is planning to go to [facility] for the winter in two weeks.  She is ambulatory without a cane at  baseline.  She drives.  She is not a smoker.       Physical Exam: BP 114/60 mmHg  Pulse 86  Temp(Src) 97.6 F (36.4 C) (Oral)  Resp 15  SpO2 100% General appearance: Well-developed, obese adult female, alert and in no acute distress, but tired appearing.   Eyes: Anicteric, conjunctiva pink, lids and lashes normal.     ENT: No nasal deformity, discharge, or epistaxis.  OP tacky without lesions.   Skin: Warm and dry.   Cardiac: RRR, nl S1-S2, no murmurs appreciated. Heart sounds very faint. Capillary refill is normal.  Neck veins flat.  No LE edema.  Radial pulses 2+ and symmetric. Respiratory: Normal respiratory rate and rhythm.  CTAB without rales or wheezes. Abdomen: Abdomen soft without rigidity.  No TTP. No ascites, distension.   MSK: No deformities or effusions. Neuro: Sensorium intact and responding to questions, attention normal.  Speech is fluent.  Moves all extremities equally and with normal coordination.    Psych: Behavior appropriate.  Affect normal.  No evidence of aural or visual hallucinations or delusions.       Labs on Admission:  The metabolic panel shows Hyponatremia, hyperkalemia marked, low bicarb, elevated creatinine. Lactate normal The  complete blood count shows no leukocytosis anemia or thrombocytopenia.   Radiological Exams on Admission: Personally reviewed: Dg Chest 2 View 06/30/2015   Clear  EKG: Independently reviewed. Sinus, rate 79.  QRSd 131 (previous 120).  LAD, Qtc normal.    Assessment/Plan 1. AKI on CKD stage III, baseline Cr/eGRF 1.4/35:  This is new.  From diarrhea, hypotension in setting of recently starting spironolactone.  No NSAIDs.   -Fluid bolus again now -Bicarbonate 1 amp now -Trend BMP -Hold nephrotoxins -If persistently elevated creatinine, consult to Nephrology.   2. Hyperkalemia:  This is new.  Due to #1 above and spironolactone/ACE -Calcium, insulin, albuterol administered -Bicarb 1 amp now -Kayexalate 30 g once and repeat in 4 hours -Trend BMP -Hold ACE, spironolactone, furosemide  3. Hypotension:  From diarrhea.  Similar presentation to last July.   -Repeat bolus now -Bicarbonate now -Hold antihypertensives  4. HTN and Chronic combined CHF with now normal EF:  Hypotensive at admission.  Re: EF, the patient has had normal EF with the exception of once during her major illness last summer, at which time she presented with AKI and hypotension after diarrhea/vomiting.   -Repeat echo to assess LVF  5. Hx Breast Cancer:  -Continue anastrozole  6. NIDDM:  -Hold home orals -Renally dosed sliding scale corrections      DVT PPx: Low dose subQ enox Diet: Renal Consultants: None at present Code Status: Full Family Communication: None present  Medical decision making: What exists of the patient's previous chart was reviewed in depth and the case was discussed with Dr. Susy Manor, Dr. Mercy Moore and Dr. Tyrone Nine. Patient seen 9:23 PM on 06/30/2015.  Disposition Plan:  Admit to inaptietn tele for hyperkalemia, hypotension, and AKI.  Stable at admission, prognosis fair.  Fluids and trend electrolytes.        Edwin Dada [facility] Pager [phone]

## 2015-06-30 NOTE — Telephone Encounter (Signed)
Left vm. Ok to stop spiro need K rechecked - per [employer]

## 2015-06-30 NOTE — Progress Notes (Signed)
Continuous neb on hold per MD Danford. MD states he needs to consult cardiology

## 2015-07-01 ENCOUNTER — Other Ambulatory Visit (HOSPITAL_COMMUNITY): Payer: PPO

## 2015-07-01 DIAGNOSIS — N179 Acute kidney failure, unspecified: Secondary | ICD-10-CM | POA: Diagnosis not present

## 2015-07-01 DIAGNOSIS — N183 Chronic kidney disease, stage 3 (moderate): Secondary | ICD-10-CM

## 2015-07-01 DIAGNOSIS — E875 Hyperkalemia: Secondary | ICD-10-CM | POA: Diagnosis not present

## 2015-07-01 DIAGNOSIS — E1129 Type 2 diabetes mellitus with other diabetic kidney complication: Secondary | ICD-10-CM

## 2015-07-01 DIAGNOSIS — I2510 Atherosclerotic heart disease of native coronary artery without angina pectoris: Secondary | ICD-10-CM | POA: Diagnosis not present

## 2015-07-01 DIAGNOSIS — I5022 Chronic systolic (congestive) heart failure: Secondary | ICD-10-CM | POA: Diagnosis not present

## 2015-07-01 LAB — BASIC METABOLIC PANEL
Anion gap: 9 (ref 5–15)
Anion gap: 8 (ref 5–15)
BUN: 46 mg/dL — ABNORMAL HIGH (ref 6–20)
BUN: 38 mg/dL — ABNORMAL HIGH (ref 6–20)
CO2: 21 mmol/L — ABNORMAL LOW (ref 22–32)
CO2: 21 mmol/L — ABNORMAL LOW (ref 22–32)
Calcium: 8.8 mg/dL — ABNORMAL LOW (ref 8.9–10.3)
Calcium: 8.7 mg/dL — ABNORMAL LOW (ref 8.9–10.3)
Chloride: 109 mmol/L (ref 101–111)
Chloride: 106 mmol/L (ref 101–111)
Creatinine, Ser: 2.81 mg/dL — ABNORMAL HIGH (ref 0.44–1.00)
Creatinine, Ser: 2.45 mg/dL — ABNORMAL HIGH (ref 0.44–1.00)
GFR calc Af Amer: 17 mL/min — ABNORMAL LOW (ref >60)
GFR calc Af Amer: 20 mL/min — ABNORMAL LOW (ref >60)
GFR calc non Af Amer: 15 mL/min — ABNORMAL LOW (ref >60)
GFR calc non Af Amer: 17 mL/min — ABNORMAL LOW (ref >60)
Glucose, Bld: 124 mg/dL — ABNORMAL HIGH (ref 65–99)
Glucose, Bld: 249 mg/dL — ABNORMAL HIGH (ref 65–99)
Potassium: 6.8 mmol/L (ref 3.5–5.1)
Potassium: 5.5 mmol/L — ABNORMAL HIGH (ref 3.5–5.1)
Sodium: 139 mmol/L (ref 135–145)
Sodium: 135 mmol/L (ref 135–145)

## 2015-07-01 LAB — CBC
HCT: 28.6 % — ABNORMAL LOW (ref 36.0–46.0)
Hemoglobin: 9.4 g/dL — ABNORMAL LOW (ref 12.0–15.0)
MCH: 32 pg (ref 26.0–34.0)
MCHC: 32.9 g/dL (ref 30.0–36.0)
MCV: 97.3 fL (ref 78.0–100.0)
Platelets: 163 10*3/uL (ref 150–400)
RBC: 2.94 MIL/uL — ABNORMAL LOW (ref 3.87–5.11)
RDW: 14.5 % (ref 11.5–15.5)
WBC: 5.9 10*3/uL (ref 4.0–10.5)

## 2015-07-01 LAB — URINE MICROSCOPIC-ADD ON: RBC / HPF: NONE SEEN RBC/hpf (ref 0–5)

## 2015-07-01 LAB — URINALYSIS, ROUTINE W REFLEX MICROSCOPIC
Bilirubin Urine: NEGATIVE
Glucose, UA: NEGATIVE mg/dL
Hgb urine dipstick: NEGATIVE
Ketones, ur: NEGATIVE mg/dL
Nitrite: NEGATIVE
Protein, ur: NEGATIVE mg/dL
Specific Gravity, Urine: 1.01 (ref 1.005–1.030)
pH: 5 (ref 5.0–8.0)

## 2015-07-01 LAB — GLUCOSE, CAPILLARY
Glucose-Capillary: 129 mg/dL — ABNORMAL HIGH (ref 65–99)
Glucose-Capillary: 146 mg/dL — ABNORMAL HIGH (ref 65–99)
Glucose-Capillary: 168 mg/dL — ABNORMAL HIGH (ref 65–99)
Glucose-Capillary: 213 mg/dL — ABNORMAL HIGH (ref 65–99)
Glucose-Capillary: 261 mg/dL — ABNORMAL HIGH (ref 65–99)

## 2015-07-01 MED ORDER — SODIUM BICARBONATE 8.4 % IV SOLN
50.0000 meq | Freq: Once | INTRAVENOUS | Status: AC
  Administered 2015-07-01: 50 meq via INTRAVENOUS
  Filled 2015-07-01: qty 50

## 2015-07-01 MED ORDER — SODIUM CHLORIDE 0.9 % IV SOLN
INTRAVENOUS | Status: DC
  Administered 2015-07-01: 11:00:00 via INTRAVENOUS

## 2015-07-01 MED ORDER — SODIUM POLYSTYRENE SULFONATE 15 GM/60ML PO SUSP
30.0000 g | Freq: Once | ORAL | Status: AC
  Administered 2015-07-01: 30 g via ORAL
  Filled 2015-07-01: qty 120

## 2015-07-01 MED ORDER — SODIUM CHLORIDE 0.9 % IV SOLN
1.0000 g | Freq: Once | INTRAVENOUS | Status: AC
  Administered 2015-07-01: 1 g via INTRAVENOUS
  Filled 2015-07-01: qty 10

## 2015-07-01 MED ORDER — INSULIN ASPART 100 UNIT/ML ~~LOC~~ SOLN
5.0000 [IU] | Freq: Once | SUBCUTANEOUS | Status: AC
  Administered 2015-07-01: 5 [IU] via INTRAVENOUS

## 2015-07-01 MED ORDER — DEXTROSE 50 % IV SOLN
1.0000 | Freq: Once | INTRAVENOUS | Status: AC
  Administered 2015-07-01: 50 mL via INTRAVENOUS
  Filled 2015-07-01: qty 50

## 2015-07-01 NOTE — Progress Notes (Signed)
[facility] Progress Note  Patient: Rebecca Hall [id number]   PCP: Mathews Argyle, MD DOB: June 01, 1932   DOA: 06/30/2015   DOS: 07/01/2015   Date of Service: the patient was seen and examined on 07/01/2015  Subjective: Patient mentions her diarrhea has improved. No abdominal pain no chest pain. Nutrition: Able to tolerate oral diet Activity: Mostly bedridden at present Last BM: 07/01/2015  Assessment and Plan: 1. Acute renal failure superimposed on stage 3 chronic kidney disease ([place]) Patient presents with complaints of weakness and dizziness. Found to be having acute kidney injury with hyperkalemia. Patient was also hypotensive. Renal function improving with hydration, potassium improving with treatment. We'll continue with IV fluids, give her one extra dose of Kayexalate. Monitor for complication. Most likely this is secondary to concurrent use of ACE inhibitor as well as Aldactone. Monitor on telemetry. Renal diet.  2. Diabetes mellitus.  Continue sliding scale insulin.  3. Diarrhea. Irritable bowel syndrome. Patient does have a history of diarrhea recently. Most likely secondary to irritable bowel syndrome. We'll continue monitoring.  4. Coronary artery disease. Continuing aspirin.  DVT Prophylaxis: subcutaneous Heparin Nutrition: Renal diet Advance goals of care discussion: Full code  Brief Summary of Hospitalization:  HPI: As per the H and P dictated on admission, "vision presented with complaints of dizziness lightheadedness with leg cramps. She also could've some low blood pressure. As recently started on Aldactone for CHF he has been on ACE inhibitor." Daily update, Procedures: 07/01/2015 given another dose of Kayexalate Consultants: None,  Antibiotics: Anti-infectives    None       Family Communication: no family was present at bedside, at the time of interview.   Disposition:  Expected discharge date: 07/03/2015 Barriers to safe discharge:  Improvement in renal function and potassium   Intake/Output Summary (Last 24 hours) at 07/01/15 1703 Last data filed at 07/01/15 1636  Gross per 24 hour  Intake   1360 ml  Output   1751 ml  Net   -391 ml   Filed Weights   06/30/15 2320  Weight: 73.936 kg (163 lb)    Objective: Physical Exam: Filed Vitals:   06/30/15 2320 07/01/15 0542 07/01/15 1000 07/01/15 1408  BP: 121/48 96/38 106/42   Pulse: 101 92 91   Temp: 98.1 F (36.7 C) 98.8 F (37.1 C) 98.3 F (36.8 C)   TempSrc: Oral Oral Oral   Resp: 16 16 18    Height:    5\' 2"  (1.575 m)  Weight: 73.936 kg (163 lb)     SpO2: 98% 95% 96%      General: Appear in mild distress, no Rash; Oral Mucosa moist. Cardiovascular: S1 and S2 Present, no Murmur, no JVD Respiratory: Bilateral Air entry present and Clear to Auscultation, no Crackles, no wheezes Abdomen: Bowel Sound present, Soft and no tenderness Extremities: no Pedal edema, no calf tenderness Neurology: Grossly no focal neuro deficit.  Data Reviewed: CBC:  Recent Labs Lab 06/30/15 1929 06/30/15 1941 07/01/15 0506  WBC 7.1  --  5.9  HGB 11.5* 13.3 9.4*  HCT 35.6* 39.0 28.6*  MCV 99.2  --  97.3  PLT 189  --  [id number]   Basic Metabolic Panel:  Recent Labs Lab 06/30/15 1929 06/30/15 1941 07/01/15 0506 07/01/15 1141  NA 132* 131* 139 135  K 7.4* 7.1* 6.8* 5.5*  CL 101 103 109 106  CO2 18*  --  21* 21*  GLUCOSE 224* 214* 124* 249*  BUN 53* 53* 46* 38*  CREATININE 3.69* 3.70*  2.81* 2.45*  CALCIUM 9.8  --  8.8* 8.7*   Liver Function Tests: No results for input(s): AST, ALT, ALKPHOS, BILITOT, PROT, ALBUMIN in the last 168 hours. No results for input(s): LIPASE, AMYLASE in the last 168 hours. No results for input(s): AMMONIA in the last 168 hours.  Cardiac Enzymes: No results for input(s): CKTOTAL, CKMB, CKMBINDEX, TROPONINI in the last 168 hours.  BNP (last 3 results)  Recent Labs  02/09/15 1515  BNP 29.9    CBG:  Recent Labs Lab 07/01/15 0039  07/01/15 0841 07/01/15 1211 07/01/15 1610  GLUCAP 146* 261* 213* 129*    No results found for this or any previous visit (from the past 240 hour(s)).   Studies: Dg Chest 2 View  06/30/2015  CLINICAL DATA:  Patient here with complaint of "not feeling well". Was seen by PCP and was found to have high potassium level reading @ 7.3. Also reports diarrhea today. Hx of hypokalemia with supplementation and poor control. EXAM: CHEST  2 VIEW COMPARISON:  01/25/2015 FINDINGS: Cardiac silhouette is mildly enlarged. There is a small hiatal hernia. No mediastinal or hilar masses or evidence adenopathy. Clear lungs.  No pleural effusion or pneumothorax. Bony thorax is demineralized but grossly intact. IMPRESSION: No acute cardiopulmonary disease. Electronically Signed   By: [name] M.D.   On: 06/30/2015 22:53     Scheduled Meds: . anastrozole  1 mg Oral Daily  . aspirin EC  81 mg Oral Daily  . atorvastatin  20 mg Oral q morning - 10a  . enoxaparin (LOVENOX) injection  30 mg Subcutaneous Daily  . gabapentin  300 mg Oral QHS  . insulin aspart  0-5 Units Subcutaneous QHS  . insulin aspart  0-9 Units Subcutaneous TID WC  . loratadine  10 mg Oral Daily  . sodium chloride  3 mL Intravenous Q12H   Continuous Infusions: . sodium chloride 75 mL/hr at 07/01/15 1052   PRN Meds:   Time spent: 30 minutes  Author: Berle Mull, MD [facility] Pager: [phone] 07/01/2015 5:03 PM  If 7PM-7AM, please contact night-coverage at [email or web address], password [facility]

## 2015-07-01 NOTE — Progress Notes (Signed)
CRITICAL VALUE ALERT  Critical value received:  K+ 6.8  Date of notification:  07/01/15  Time of notification:  07:28  Critical value read back: yes  Nurse who received alert:  Percell Boston  MD notified (1st page):  Posey Pronto  Time of first page:  07:29  MD notified (2nd page):  Time of second page:  Responding MD:    Time MD responded:

## 2015-07-01 NOTE — Progress Notes (Signed)
Patient refuse to wear yellow socks.

## 2015-07-02 DIAGNOSIS — E875 Hyperkalemia: Secondary | ICD-10-CM | POA: Diagnosis not present

## 2015-07-02 DIAGNOSIS — I2510 Atherosclerotic heart disease of native coronary artery without angina pectoris: Secondary | ICD-10-CM | POA: Diagnosis not present

## 2015-07-02 DIAGNOSIS — I5042 Chronic combined systolic (congestive) and diastolic (congestive) heart failure: Secondary | ICD-10-CM

## 2015-07-02 DIAGNOSIS — E1129 Type 2 diabetes mellitus with other diabetic kidney complication: Secondary | ICD-10-CM | POA: Diagnosis not present

## 2015-07-02 DIAGNOSIS — N179 Acute kidney failure, unspecified: Secondary | ICD-10-CM | POA: Diagnosis not present

## 2015-07-02 LAB — COMPREHENSIVE METABOLIC PANEL
ALT: 13 U/L — ABNORMAL LOW (ref 14–54)
AST: 15 U/L (ref 15–41)
Albumin: 2.7 g/dL — ABNORMAL LOW (ref 3.5–5.0)
Alkaline Phosphatase: 67 U/L (ref 38–126)
Anion gap: 8 (ref 5–15)
BUN: 25 mg/dL — ABNORMAL HIGH (ref 6–20)
CO2: 22 mmol/L (ref 22–32)
Calcium: 8.3 mg/dL — ABNORMAL LOW (ref 8.9–10.3)
Chloride: 108 mmol/L (ref 101–111)
Creatinine, Ser: 1.84 mg/dL — ABNORMAL HIGH (ref 0.44–1.00)
GFR calc Af Amer: 29 mL/min — ABNORMAL LOW (ref >60)
GFR calc non Af Amer: 25 mL/min — ABNORMAL LOW (ref >60)
Glucose, Bld: 109 mg/dL — ABNORMAL HIGH (ref 65–99)
Potassium: 4.7 mmol/L (ref 3.5–5.1)
Sodium: 138 mmol/L (ref 135–145)
Total Bilirubin: 0.5 mg/dL (ref 0.3–1.2)
Total Protein: 5.6 g/dL — ABNORMAL LOW (ref 6.5–8.1)

## 2015-07-02 LAB — CBC WITH DIFFERENTIAL/PLATELET
Basophils Absolute: 0 10*3/uL (ref 0.0–0.1)
Basophils Relative: 0 %
Eosinophils Absolute: 0.1 10*3/uL (ref 0.0–0.7)
Eosinophils Relative: 3 %
HCT: 27 % — ABNORMAL LOW (ref 36.0–46.0)
Hemoglobin: 8.7 g/dL — ABNORMAL LOW (ref 12.0–15.0)
Lymphocytes Relative: 36 %
Lymphs Abs: 1.7 10*3/uL (ref 0.7–4.0)
MCH: 31.6 pg (ref 26.0–34.0)
MCHC: 32.2 g/dL (ref 30.0–36.0)
MCV: 98.2 fL (ref 78.0–100.0)
Monocytes Absolute: 0.6 10*3/uL (ref 0.1–1.0)
Monocytes Relative: 12 %
Neutro Abs: 2.3 10*3/uL (ref 1.7–7.7)
Neutrophils Relative %: 49 %
Platelets: 145 10*3/uL — ABNORMAL LOW (ref 150–400)
RBC: 2.75 MIL/uL — ABNORMAL LOW (ref 3.87–5.11)
RDW: 14.6 % (ref 11.5–15.5)
WBC: 4.7 10*3/uL (ref 4.0–10.5)

## 2015-07-02 LAB — GLUCOSE, CAPILLARY: Glucose-Capillary: 117 mg/dL — ABNORMAL HIGH (ref 65–99)

## 2015-07-02 MED ORDER — FUROSEMIDE 20 MG PO TABS: 20.0000 mg | ORAL_TABLET | Freq: Every day | ORAL | Status: DC

## 2015-07-02 NOTE — Progress Notes (Signed)
Discharge instructions and medications discussed with patient.  All questions answered.  

## 2015-07-02 NOTE — Progress Notes (Signed)
Patient ambulated 75 ft with walker.  Patient tolerated well.

## 2015-07-03 NOTE — Telephone Encounter (Signed)
Pt was admitted 1/6 pm for hyperkalemia and acute renal insuff, will sch f/u with Dr Haroldine Laws

## 2015-07-04 ENCOUNTER — Other Ambulatory Visit: Payer: Self-pay | Admitting: Oncology

## 2015-07-05 ENCOUNTER — Ambulatory Visit (HOSPITAL_COMMUNITY)
Admission: RE | Admit: 2015-07-05 | Discharge: 2015-07-05 | Disposition: A | Discharge status: Home / Self Care | Payer: PPO | Source: Ambulatory Visit | Attending: Internal Medicine | Admitting: Internal Medicine

## 2015-07-05 ENCOUNTER — Encounter (HOSPITAL_COMMUNITY): Payer: Self-pay | Admitting: Internal Medicine

## 2015-07-05 VITALS — BP 86/54 | HR 79 | Wt 159.5 lb

## 2015-07-05 DIAGNOSIS — Z7982 Long term (current) use of aspirin: Secondary | ICD-10-CM | POA: Diagnosis not present

## 2015-07-05 DIAGNOSIS — C50911 Malignant neoplasm of unspecified site of right female breast: Secondary | ICD-10-CM | POA: Diagnosis not present

## 2015-07-05 DIAGNOSIS — I2510 Atherosclerotic heart disease of native coronary artery without angina pectoris: Secondary | ICD-10-CM | POA: Diagnosis not present

## 2015-07-05 DIAGNOSIS — E1122 Type 2 diabetes mellitus with diabetic chronic kidney disease: Secondary | ICD-10-CM | POA: Insufficient documentation

## 2015-07-05 DIAGNOSIS — E785 Hyperlipidemia, unspecified: Secondary | ICD-10-CM | POA: Diagnosis not present

## 2015-07-05 DIAGNOSIS — M810 Age-related osteoporosis without current pathological fracture: Secondary | ICD-10-CM | POA: Diagnosis not present

## 2015-07-05 DIAGNOSIS — I130 Hypertensive heart and chronic kidney disease with heart failure and stage 1 through stage 4 chronic kidney disease, or unspecified chronic kidney disease: Secondary | ICD-10-CM | POA: Insufficient documentation

## 2015-07-05 DIAGNOSIS — N183 Chronic kidney disease, stage 3 (moderate): Secondary | ICD-10-CM | POA: Insufficient documentation

## 2015-07-05 DIAGNOSIS — K219 Gastro-esophageal reflux disease without esophagitis: Secondary | ICD-10-CM | POA: Diagnosis not present

## 2015-07-05 DIAGNOSIS — I5022 Chronic systolic (congestive) heart failure: Secondary | ICD-10-CM | POA: Diagnosis not present

## 2015-07-05 DIAGNOSIS — E875 Hyperkalemia: Secondary | ICD-10-CM | POA: Insufficient documentation

## 2015-07-05 DIAGNOSIS — Z79899 Other long term (current) drug therapy: Secondary | ICD-10-CM | POA: Diagnosis not present

## 2015-07-05 DIAGNOSIS — K589 Irritable bowel syndrome without diarrhea: Secondary | ICD-10-CM | POA: Diagnosis not present

## 2015-07-05 DIAGNOSIS — Z7984 Long term (current) use of oral hypoglycemic drugs: Secondary | ICD-10-CM | POA: Diagnosis not present

## 2015-07-05 NOTE — Patient Instructions (Signed)
Routine lab work today. Will notify you of abnormal results, otherwise no news is good news!  Return next week for repeat labs.  Follow up 6-8 weeks.  Do the following things EVERYDAY: 1) Weigh yourself in the morning before breakfast. Write it down and keep it in a log. 2) Take your medicines as prescribed 3) Eat low salt foods-Limit salt (sodium) to 2000 mg per day.  4) Stay as active as you can everyday 5) Limit all fluids for the day to less than 2 liters

## 2015-07-05 NOTE — Progress Notes (Signed)
Advanced Heart Failure Clinic Note   Patient ID: Rebecca Hall, female   DOB: 1931-09-09, 82 y.o.   MRN: [id number] PCP: Dr Felipa Eth.  Primary HF: Dr Haroldine Laws Oncologist: Dr Jana Hakim  HPI: Rebecca Hall is a 82 y.o. female with history of Breast Cancer treated with 3 doses only of herceptin in 2014, CKD, HTN, HLD, Osteoporosis, GERD, Hx of GI Bleed, and DM.  Admitted to [facility] 8/16 with hypovolemic shock, AKI creatinine 4.7. After fluid resuscitation developed pulmonary edema. Found to have new onset LV dysfunction. Had cath as noted below with 1v CAD with 60 proximal LAD and NICM. Discharged on lasix, carvedilol, and entresto. Discharge weight was 156 pounds.   Admitted 06/30/15 with weakness and dizziness and found to have AKI, hyperkalemia, and hypotension. (Creatinine 3.69 and K 7.4 on admit) Pt had stopped spiro several days prior with dizziness. K treated in ED as well as inpatient setting. Received IV fluid for hypotension. Unfortunately, there is no AVS or d/c summary available so it is unclear final medication changes. Pt states she started her lasix and lisinopril back the day after discharge.   She returns today for post hospital follow up after admit above.  At our last visit we added spiro to help with BP and mild hypokalemia, with continued hypokalemia on follow up lab work we increased K further.  At highest dose, pt was taking 12.5 mg of spiro and 40 meq BID of potassium.  Had labwork at PCP (Dr Felipa Eth) on 06/30/15 and K was "dangerously high" (unsure specific value). She denies any known insult or nephrotoxic agents ( no Ibuprofen). Did feel like she wasn't peeing as much. She has felt better since discharge, but remains dizzy. BP had been stable at home (110-130s) but low at our check today.  Denies SOB. Can still walk grocery store with buggy. No orthopnea or PND. Has occasional muscle type pain in chest when sitting up or lying back. No relation to exertion.   [facility] 01/27/2015  RA =  11 RV = 42/8/15 PA = 38/8 (24) PCW = 19 Fick cardiac output/index = 5.2/3.0 Thermo CO/CI = 5.7/3.2 PVR = 1.0 WU FA sat = 93% PA sat = 51%, 51% Ao Pressure: 133/60 (85) LV Pressure: 133/9/17 No aortic stenosis  Assessment: 1) Moderate 1v CAD with 60% proximal LAD lesion 2) Non-ischemic CM with EF 30-35% by echo 3) Mildly elevated R-sided pressures with normal cardiac output  Labs:  06/09/15 K 2.7, Creatinine 1.4   ROS: All systems negative except as listed in HPI, PMH and Problem List.  SH:  Social History   Social History  . Marital Status: Married    Spouse Name: N/A  . Number of Children: N/A  . Years of Education: N/A   Occupational History  . Not on file.   Social History Main Topics  . Smoking status: Never Smoker   . Smokeless tobacco: Never Used  . Alcohol Use: No  . Drug Use: No  . Sexual Activity: Not on file     Comment: first live birth age 35, P 2, Estrogen    Other Topics Concern  . Not on file   Social History Narrative    FH:  Family History  Problem Relation Age of Onset  . Cancer Maternal Aunt 80    colon cancer  . Cancer Maternal Uncle 65    lung  . Cancer Maternal Grandmother     pancreas  . Cerebral aneurysm Father  Past Medical History  Diagnosis Date  . Hyperlipidemia   . Hypertension   . Chronic kidney disease   . Osteoporosis   . GERD (gastroesophageal reflux disease)   . IBS (irritable bowel syndrome)   . Arthritis   . Neuromuscular disorder ([place])     peripheral neuropathy feet  . History of lower GI bleeding   . Breast cancer ([place]) 03/15/13    right, 12 o'clock  . Diabetes mellitus without complication [facility])     Current Outpatient Prescriptions  Medication Sig Dispense Refill  . allopurinol (ZYLOPRIM) 100 MG tablet Take 100 mg by mouth See admin instructions. Take with 300 mg tablet for a total of 400mg  daily    . allopurinol (ZYLOPRIM) 300 MG tablet Take 300 mg by mouth daily. Take with 100 mg tablet for  total of 400 mg daily    . anastrozole (ARIMIDEX) 1 MG tablet Take 1 tablet (1 mg total) by mouth daily. 90 tablet 3  . aspirin 81 MG tablet Take 81 mg by mouth daily.    Marland Kitchen atorvastatin (LIPITOR) 20 MG tablet Take 20 mg by mouth every morning.     . calcium carbonate (OS-CAL) 600 MG TABS tablet Take 600 mg by mouth 2 (two) times daily with a meal.     . carvedilol (COREG) 3.125 MG tablet Take 1 tablet (3.125 mg total) by mouth 2 (two) times daily with a meal. 180 tablet 3  . furosemide (LASIX) 40 MG tablet Take 40 mg by mouth. Take 40mg  every morning and 20mg  every evening    . gabapentin (NEURONTIN) 300 MG capsule TAKE 1 CAPSULE BY MOUTH DAILY AT BEDTIME 90 capsule 1  . glimepiride (AMARYL) 2 MG tablet Take 2 mg by mouth daily with breakfast.    . lisinopril (PRINIVIL,ZESTRIL) 10 MG tablet Take 10 mg by mouth daily.    Marland Kitchen loratadine (CLARITIN) 10 MG tablet Take 10 mg by mouth daily.    . minoxidil (ROGAINE) 2 % external solution Apply 1 application topically daily.     . Multiple Vitamin (MULTIVITAMIN WITH MINERALS) TABS tablet Take 1 tablet by mouth 2 (two) times daily.     Marland Kitchen omeprazole (PRILOSEC) 20 MG capsule Take 20 mg by mouth daily.     . Vitamin D, Ergocalciferol, (DRISDOL) 50000 UNITS CAPS capsule Take 50,000 Units by mouth every 14 (fourteen) days.     . vitamin E 100 UNIT capsule Take 100 Units by mouth daily.     No current facility-administered medications for this encounter.    Filed Vitals:   07/05/15 1404  BP: 86/54  Pulse: 79  Weight: 159 lb 8 oz (72.349 kg)  SpO2: 98%   BP 94/56  Wt Readings from Last 3 Encounters:  07/05/15 159 lb 8 oz (72.349 kg)  07/02/15 164 lb 0.4 oz (74.4 kg)  06/13/15 163 lb 3.2 oz (74.027 kg)    PHYSICAL EXAM:  General:  Well appearing. NAD. Husband present.  HEENT: normal Neck: supple. JVP 6-7. Carotids 2+ bilaterally; no bruits. No thyromegaly or lymphadenopathy noted. Cor: PMI normal. RRR. No M/G/R Lungs: Clear, no resp  distress Abdomen: soft, non-tender, non-distended, no HSM. No bruits or masses. +BS  Extremities: no cyanosis, clubbing, rash. Trace LE edema L>R with varicosities. Neuro: alert & orientedx3, cranial nerves grossly intact. Moves all 4 extremities w/o difficulty. Affect pleasant.   ASSESSMENT & PLAN: 1. Chronic  systolic CHF : EF 99991111, decreased from normal in 1/15 - Echo 06/12/15 read as 55-60%. -  NYHA II-III. Volume status stable on exam. - Continue lisinopril 10 mg. She does not tolerate Entresto with hypotension and syncopal episode.  - Continue lasix 40 mg q am/ 20 mg q pm for now. If remains hypotensive can drop evening dose, and add back as needed for weight gain. - Will not add spiro back.  2. CKD, Stage 3  - BMET today and next week.  3. Hx of Breast Cancer - 3 doses of Herceptin in 12 /14 4. DM2 5. HTN - Low today. Plan as above.  6. HLD - Continue lipitor 20 mg daily 7. Heavy snoring - Still willing to do sleep study once they are done with their travels (aiming for early spring)  8. CAD - 1v.  - Nonobstructive.  - Continue lipitor 20 mg and ASA 81mg  9. Hypokalemia - Over corrected to hyperkalemia.  - Back on lasix and lisinopril but no spiro or potassium.  BMET today with repeat next week. Folllow up in 6-8  Satira Mccallum Tillery PA-C 2:30 PM   Patient seen and examined with Oda Kilts, PA-C. We discussed all aspects of the encounter. I agree with the assessment and plan as stated above.   Volume status improved. Hyperkalemia resolved. BP soft. ( I checked personally with manual cuff). Will continue current regimen. Check labs again today. ECho reviewed with her. EF improved. CAD stable. Sleep study in future.   Leanord Thibeau,MD 11:53 PM

## 2015-07-06 ENCOUNTER — Other Ambulatory Visit: Payer: Self-pay | Admitting: *Deleted

## 2015-07-06 MED ORDER — GABAPENTIN 300 MG PO CAPS: ORAL_CAPSULE | ORAL | Status: DC

## 2015-07-07 ENCOUNTER — Telehealth (HOSPITAL_COMMUNITY): Payer: Self-pay | Admitting: Cardiology

## 2015-07-07 ENCOUNTER — Ambulatory Visit (HOSPITAL_COMMUNITY)
Admission: RE | Admit: 2015-07-07 | Discharge: 2015-07-07 | Disposition: A | Discharge status: Home / Self Care | Payer: PPO | Source: Ambulatory Visit | Attending: Internal Medicine | Admitting: Internal Medicine

## 2015-07-07 DIAGNOSIS — N183 Chronic kidney disease, stage 3 unspecified: Secondary | ICD-10-CM

## 2015-07-07 LAB — BASIC METABOLIC PANEL
Anion gap: 12 (ref 5–15)
BUN: 30 mg/dL — ABNORMAL HIGH (ref 6–20)
CO2: 24 mmol/L (ref 22–32)
Calcium: 8.9 mg/dL (ref 8.9–10.3)
Chloride: 103 mmol/L (ref 101–111)
Creatinine, Ser: 2.07 mg/dL — ABNORMAL HIGH (ref 0.44–1.00)
GFR calc Af Amer: 25 mL/min — ABNORMAL LOW (ref >60)
GFR calc non Af Amer: 21 mL/min — ABNORMAL LOW (ref >60)
Glucose, Bld: 123 mg/dL — ABNORMAL HIGH (ref 65–99)
Potassium: 3.9 mmol/L (ref 3.5–5.1)
Sodium: 139 mmol/L (ref 135–145)

## 2015-07-07 MED ORDER — POTASSIUM CHLORIDE ER 10 MEQ PO TBCR: 20.0000 meq | EXTENDED_RELEASE_TABLET | Freq: Every day | ORAL | Status: DC

## 2015-07-07 NOTE — Telephone Encounter (Signed)
Patient called to review labs results that were drawn on 07/05/15, as her potassium was on hold pending results, however no lab results are available I apologized for the inconvenience however patient will need to return for labs and will run STAT so we can have results available before end of day  patient agreeable and will return today

## 2015-07-07 NOTE — Telephone Encounter (Signed)
Lab results reviewed by Rebecca Hall Patient should restart Potassium 20 meq daily, if she taking lasix 40 mg in the AM and 20 mg in the PM Patient was ordered to decrease lasix to 40 mg daily if she began symptomatically  hypotensive, in the event she did need to decrease lasix to 40 mg daily she should also decrease potassium to 10 meq daily   Pt aware and voiced understanding

## 2015-07-11 ENCOUNTER — Other Ambulatory Visit: Payer: Self-pay

## 2015-07-11 NOTE — Patient Outreach (Addendum)
[facility]) Care Management  07/11/2015  Rebecca Hall 03-17-1932 [id number]   SUBJECTIVE:  Telephone call to patient regarding follow up.  HIPAA verified with patient.  Patient states, "I would be ok if I didn't have this, "creck in my neck." Patient states, "my neck just started hurting last Saturday and it hasn't stopped."  Patient states she took an aspirin for the pain which helped.  Patient states she has an appointment with her primary MD on tomorrow 07/12/15 and she will discuss her neck pain with him.   Patient denies shortness of breath or swelling.  Patient reports she is in the Marko Skalski zone of her heart failure action plan today.  Patient reports she saw Dr. Tempie Hoist approximately 1 week ago. Patient states she had blood work done and will follow up in a week for blood work to be repeated. Patient states she was in the hospital from 06/30/15 to 07/02/15 due to elevated potassium levels.  Patient states Dr. Tempie Hoist is monitoring her potassium levels closely as well as her primary MD. Patient stated she is to take potassium 20 mg 1 time a day if no extra fluid.  Patient states she has not had to take extra potassium.  Patient reports her weight is 154 lbs. Patient reports her blood pressure is 106/58. RNCM advised patient to continue to take her medication as advised by her doctors RNCM reviewed heart failure action plan and zones with patient.  RNCM advised patient to continue to keep her follow up appointments with her doctor.  Patient agreed to next telephone outreach with [facility].    ASSESSMENT: Heart failure follow up  PLAN:  RNCM will follow up with patient within 1 month.  Quinn Plowman RN,BSN,CCM [facility] Telephonic  [phone]

## 2015-07-12 DIAGNOSIS — E1122 Type 2 diabetes mellitus with diabetic chronic kidney disease: Secondary | ICD-10-CM | POA: Diagnosis not present

## 2015-07-12 DIAGNOSIS — N183 Chronic kidney disease, stage 3 (moderate): Secondary | ICD-10-CM | POA: Diagnosis not present

## 2015-07-12 DIAGNOSIS — Z7984 Long term (current) use of oral hypoglycemic drugs: Secondary | ICD-10-CM | POA: Diagnosis not present

## 2015-07-12 DIAGNOSIS — I129 Hypertensive chronic kidney disease with stage 1 through stage 4 chronic kidney disease, or unspecified chronic kidney disease: Secondary | ICD-10-CM | POA: Diagnosis not present

## 2015-07-12 DIAGNOSIS — M542 Cervicalgia: Secondary | ICD-10-CM | POA: Diagnosis not present

## 2015-07-12 DIAGNOSIS — D509 Iron deficiency anemia, unspecified: Secondary | ICD-10-CM | POA: Diagnosis not present

## 2015-07-14 ENCOUNTER — Ambulatory Visit (HOSPITAL_COMMUNITY)
Admission: RE | Admit: 2015-07-14 | Discharge: 2015-07-14 | Disposition: A | Discharge status: Home / Self Care | Payer: PPO | Source: Ambulatory Visit | Attending: Cardiology | Admitting: Cardiology

## 2015-07-14 DIAGNOSIS — I5022 Chronic systolic (congestive) heart failure: Secondary | ICD-10-CM | POA: Diagnosis not present

## 2015-07-14 LAB — BASIC METABOLIC PANEL
Anion gap: 15 (ref 5–15)
BUN: 37 mg/dL — ABNORMAL HIGH (ref 6–20)
CO2: 23 mmol/L (ref 22–32)
Calcium: 10.1 mg/dL (ref 8.9–10.3)
Chloride: 97 mmol/L — ABNORMAL LOW (ref 101–111)
Creatinine, Ser: 2.35 mg/dL — ABNORMAL HIGH (ref 0.44–1.00)
GFR calc Af Amer: 21 mL/min — ABNORMAL LOW (ref >60)
GFR calc non Af Amer: 18 mL/min — ABNORMAL LOW (ref >60)
Glucose, Bld: 202 mg/dL — ABNORMAL HIGH (ref 65–99)
Potassium: 4.2 mmol/L (ref 3.5–5.1)
Sodium: 135 mmol/L (ref 135–145)

## 2015-07-14 LAB — BRAIN NATRIURETIC PEPTIDE: B Natriuretic Peptide: 81 pg/mL (ref 0.0–100.0)

## 2015-07-15 NOTE — Discharge Summary (Signed)
[facility] Discharge Summary   Patient: Rebecca [id number]   PCP: Mathews Argyle, MD DOB: 11/08/1931   Date of admission: 06/30/2015   Date of discharge: 07/02/2015    Discharge Diagnoses:  Principal Problem:   Acute renal failure superimposed on stage 3 chronic kidney disease ([place]) Active Problems:   Breast cancer of upper-outer quadrant of right female breast (HCC)   Acidosis, metabolic   Hyperkalemia   DM (diabetes mellitus), type 2 with renal complications (HCC)   Chronic systolic heart failure (HCC)   CAD in native artery   AKI (acute kidney injury) ([place])  Recommendations for Outpatient Follow-up:  1.  Please stop taking lisinopril and Aldactone as in the AVS, until seen by her PCP and her potassium level is rechecked.  Diet recommendation: Low sodium heart healthy diet  Activity: The patient is advised to gradually reintroduce usual activities.  Discharge Condition: good  History of present illness: As per the H and P dictated on admission, "Rebecca Hall is a 82 y.o. female with a past medical history significant for CKD stage III baseline Cr 1.3, NIDDM, Breast CA on anastrozole, CAD and episode of NICM last summer who presents with hypotension and AKI.  Three weeks ago the patient started spironolactone for her CHF, but never got her follow-up BMP. Now, in the last 3 days she has had diarrhea, then malaise, presyncope and orthostasis, oliguria, leg cramps, and low blood pressure. So she went to her PCP who found her potassium >7 and sent her to the ER.   In the ED, she had Cr 3.69 mg/dL, K 7.4 mmol/L, bicarb 18, and SBP 85-100 mmHg. She had no respiratory distress. Insulin, D50, calcium gluc, and albuterol were administered and TRH were asked to evaluate for admission.  Of note, the patient had a similar presentation last July: she presented several days of nausea and vomiting followed by orthostatic symptoms was found to be hypotensive and with AKI  (serum creatinine >4). During that hospitalization her blood pressure did not rise with 4 L of fluids, she was briefly on pressors, and then she developed pulmonary edema. An echocardiogram subsequently showed a new EF 30% and she was diuresed. A L/RHC at the time showed minimal CAD, and it was thought to be a NICM. Since then she has been followed by Dr. Missy Sabins, and interestingly her EF last month was 55-60% without valvular disease. Her dry weight usually fluctuates between 155 and 163 and has been 154 last 3 days."  Hospital Course:  Summary of her active problems in the hospital is as following.   1. Acute renal failure superimposed on stage 3 chronic kidney disease (HCC) Hyperkalemia Patient presents with complaints of weakness and dizziness. Found to be having acute kidney injury with hyperkalemia. Patient was also hypotensive. Renal function improving with hydration, potassium improving with Kayexalate. Most likely this is secondary to concurrent use of ACE inhibitor as well as Aldactone. Patient's renal function improved rapidly from 3.69 on admission to 1.84 on discharge. Patient remained euvolemic.  2. Diabetes mellitus. Continue taking Amaryl at home  3. Diarrhea. Irritable bowel syndrome. Resolved here. Patient does have a history of diarrhea recently. Most likely secondary to irritable bowel syndrome. We'll continue monitoring.  4. Coronary artery disease. Continuing aspirin.  5. Chronic diastolic dysfunction. Ejection fraction 55-60% on echocardiogram in December. Patient was seen to be having increasing requirement of Lasix as outpatient and  Aldactone was added. Now patient presents with acute kidney injury with hyperkalemia.  I would switch her Lasix to as needed only on discharge. And discontinue potassium, Aldactone, lisinopril on discharge.  All other chronic medical condition were stable during the hospitalization.  Patient was ambulatory without any  assistance. On the day of the discharge the patient's renal function improved and potassium level stabilized, and no other acute medical condition were reported by patient. the patient was felt safe to be discharge at home with family.  Procedures and Results:  none   Consultations:  none  DISCHARGE MEDICATION: Discharge Medication List as of 07/02/2015 11:15 AM    CONTINUE these medications which have CHANGED   Details  furosemide (LASIX) 20 MG tablet Take 1 tablet (20 mg total) by mouth daily. And Take 1 tablet extra for weight gain of more than 2 LB in a day or leg swelling., Starting 07/02/2015, Until Discontinued, Normal      CONTINUE these medications which have NOT CHANGED   Details  allopurinol (ZYLOPRIM) 100 MG tablet Take 100 mg by mouth See admin instructions. Take with 300 mg tablet for a total of 458m daily, Starting 02/05/2013, Until Discontinued, Historical Med    anastrozole (ARIMIDEX) 1 MG tablet Take 1 tablet (1 mg total) by mouth daily., Starting 06/13/2015, Until Discontinued, Normal    aspirin 81 MG tablet Take 81 mg by mouth daily., Until Discontinued, Historical Med    atorvastatin (LIPITOR) 20 MG tablet Take 20 mg by mouth every morning. , Starting 02/23/2013, Until Discontinued, Historical Med    calcium carbonate (OS-CAL) 600 MG TABS tablet Take 600 mg by mouth 2 (two) times daily with a meal. , Until Discontinued, Historical Med    carvedilol (COREG) 3.125 MG tablet Take 1 tablet (3.125 mg total) by mouth 2 (two) times daily with a meal., Starting 03/03/2015, Until Discontinued, Normal    glimepiride (AMARYL) 2 MG tablet Take 2 mg by mouth daily with breakfast., Until Discontinued, Historical Med    loratadine (CLARITIN) 10 MG tablet Take 10 mg by mouth daily., Until Discontinued, Historical Med    minoxidil (ROGAINE) 2 % external solution Apply 1 application topically daily. , Until Discontinued, Historical Med    Multiple Vitamin (MULTIVITAMIN WITH  MINERALS) TABS tablet Take 1 tablet by mouth 2 (two) times daily. , Until Discontinued, Historical Med    omeprazole (PRILOSEC) 20 MG capsule Take 20 mg by mouth daily. , Starting 03/08/2013, Until Discontinued, Historical Med    Vitamin D, Ergocalciferol, (DRISDOL) 50000 UNITS CAPS capsule Take 50,000 Units by mouth every 14 (fourteen) days. , Until Discontinued, Historical Med    vitamin E 100 UNIT capsule Take 100 Units by mouth daily., Until Discontinued, Historical Med    diazepam (VALIUM) 5 MG tablet Take 1 tablet (5 mg total) by mouth at bedtime as needed for anxiety., Starting 01/07/2014, Until Discontinued, Phone In    gabapentin (NEURONTIN) 300 MG capsule TAKE 1 CAPSULE BY MOUTH DAILY AT BEDTIME, Normal      STOP taking these medications     lisinopril (ZESTRIL) 10 MG tablet      potassium chloride (K-DUR) 10 MEQ tablet      spironolactone (ALDACTONE) 25 MG tablet        Allergies  Allergen Reactions  . Levaquin [Levofloxacin In D5w] Anaphylaxis and Nausea Only    Shaking real bad on medicaton  . Codeine Nausea And Vomiting    Pass out  . Herceptin [Trastuzumab] Rash    Patient has developed extensive body skin rash following each Herceptin treatment of increased  presence following each treatment  that cleared with oral steroids  . Sulfa Antibiotics Swelling   Discharge Instructions    Diet - low sodium heart healthy    Complete by:  As directed      Discharge instructions    Complete by:  As directed   It is important that you read following instructions as well as go over your medication list with RN to help you understand your care after this hospitalization.  Discharge Instructions: For Heart failure patients -  Check your Weight same time everyday If you gain over 2 pounds, or you develop in leg swelling, experience more shortness of breath or chest pain, call your Primary MD immediately.  Avoid adding extra salt in the food, food high in salt and Follow 1.5  lit/day fluid restriction.  Follow up with PCP in 1 week for potassium levels and adjustment of medication of BP and CHF.   Please request your primary care physician to go over all Hospital Tests and Procedure/Radiological results at the follow up,  Please get all Hospital records sent to your PCP by signing hospital release before you go home.   You were cared for by a hospitalist during your hospital stay. If you have any questions about your discharge medications or the care you received while you were in the hospital after you are discharged, you can call the unit and ask to speak with the hospitalist on call if the hospitalist that took care of you is not available.  Once you are discharged, your primary care physician will handle any further medical issues. Please note that NO REFILLS for any discharge medications will be authorized once you are discharged, as it is imperative that you return to your primary care physician (or establish a relationship with a primary care physician if you do not have one) for your aftercare needs so that they can reassess your need for medications and monitor your lab values. You Must read complete instructions/literature along with all the possible adverse reactions/side effects for all the Medicines you take and that have been prescribed to you. Take any new Medicines after you have completely understood and accept all the possible adverse reactions/side effects. Wear Seat belts while driving.     Heart Failure patients record your daily weight using the same scale at the same time of day    Complete by:  As directed      Increase activity slowly    Complete by:  As directed           Discharge Exam: Filed Weights   06/30/15 2320 07/01/15 2039 07/02/15 0933  Weight: 73.936 kg (163 lb) 76.34 kg (168 lb 4.8 oz) 74.4 kg (164 lb 0.4 oz)   Filed Vitals:   07/02/15 0502 07/02/15 0755  BP: 135/57 138/68  Pulse: 80 88  Temp: 97.5 F (36.4 C) 99 F (37.2  C)  Resp: 14 16   General: Appear in no distress, no Rash; Oral Mucosa moist. Cardiovascular: S1 and S2 Present, no Murmur, no JVD Respiratory: Bilateral Air entry present and Clear to Auscultation, no Crackles, no wheezes Abdomen: Bowel Sound present, Soft and no tenderness Extremities: no Pedal edema, no calf tenderness Neurology: Grossly no focal neuro deficit.  The results of significant diagnostics from this hospitalization (including imaging, microbiology, ancillary and laboratory) are listed below for reference.    Significant Diagnostic Studies: Dg Chest 2 View  06/30/2015  CLINICAL DATA:  Patient here with complaint of "not feeling well".  Was seen by PCP and was found to have high potassium level reading @ 7.3. Also reports diarrhea today. Hx of hypokalemia with supplementation and poor control. EXAM: CHEST  2 VIEW COMPARISON:  01/25/2015 FINDINGS: Cardiac silhouette is mildly enlarged. There is a small hiatal hernia. No mediastinal or hilar masses or evidence adenopathy. Clear lungs.  No pleural effusion or pneumothorax. Bony thorax is demineralized but grossly intact. IMPRESSION: No acute cardiopulmonary disease. Electronically Signed   By: [name] M.D.   On: 06/30/2015 22:53   Labs: Results for KATHALEYA, MCDUFFEE (MRN [id number])   07/02/2015 04:47  Sodium 138  Potassium 4.7  Chloride 108  CO2 22  BUN 25 (H)  Creatinine 1.84 (H)  Calcium 8.3 (L)  EGFR (Non-African Amer.) 25 (L)  EGFR (African American) 29 (L)  Glucose 109 (H)  Anion gap 8  Alkaline Phosphatase 67  Albumin 2.7 (L)  AST 15  ALT 13 (L)  Total Protein 5.6 (L)  Total Bilirubin 0.5   BNP (last 3 results)  Recent Labs  02/09/15 1515  BNP 29.9   Time spent: 30 minutes  Signed:  Berle Mull  [facility] 07/02/2015

## 2015-07-27 ENCOUNTER — Telehealth (HOSPITAL_COMMUNITY): Payer: Self-pay

## 2015-07-27 NOTE — Telephone Encounter (Signed)
Patient concerned that her potasium could be too high or too low.  Hasn't had it checked since her medication change She will be here on Monday for [facility]

## 2015-07-31 ENCOUNTER — Ambulatory Visit (HOSPITAL_COMMUNITY)
Admission: RE | Admit: 2015-07-31 | Discharge: 2015-07-31 | Disposition: A | Discharge status: Home / Self Care | Payer: PPO | Source: Ambulatory Visit | Attending: Cardiology | Admitting: Cardiology

## 2015-07-31 DIAGNOSIS — I5022 Chronic systolic (congestive) heart failure: Secondary | ICD-10-CM | POA: Insufficient documentation

## 2015-07-31 LAB — BASIC METABOLIC PANEL
Anion gap: 16 — ABNORMAL HIGH (ref 5–15)
BUN: 16 mg/dL (ref 6–20)
CO2: 19 mmol/L — ABNORMAL LOW (ref 22–32)
Calcium: 9.5 mg/dL (ref 8.9–10.3)
Chloride: 104 mmol/L (ref 101–111)
Creatinine, Ser: 1.34 mg/dL — ABNORMAL HIGH (ref 0.44–1.00)
GFR calc Af Amer: 42 mL/min — ABNORMAL LOW (ref >60)
GFR calc non Af Amer: 36 mL/min — ABNORMAL LOW (ref >60)
Glucose, Bld: 274 mg/dL — ABNORMAL HIGH (ref 65–99)
Potassium: 4.1 mmol/L (ref 3.5–5.1)
Sodium: 139 mmol/L (ref 135–145)

## 2015-08-10 DIAGNOSIS — D649 Anemia, unspecified: Secondary | ICD-10-CM | POA: Diagnosis not present

## 2015-08-18 ENCOUNTER — Other Ambulatory Visit: Payer: Self-pay

## 2015-08-18 DIAGNOSIS — D509 Iron deficiency anemia, unspecified: Secondary | ICD-10-CM | POA: Diagnosis not present

## 2015-08-18 NOTE — Patient Outreach (Signed)
[facility]) Care Management  08/18/2015  Rebecca Hall 03-11-1932 [id number]   Telephone call to patient regarding follow up. Person identifying themselves as patients husband states patient is not at home. HIPAA compliant message left with call back phone number.   PLAN: RNCM will attempt 2nd telephone outreach to patient within 1 month.   Quinn Plowman RN,BSN,CCM [facility] Telephonic  [phone]

## 2015-08-23 ENCOUNTER — Other Ambulatory Visit: Payer: Self-pay

## 2015-08-23 NOTE — Patient Outreach (Addendum)
[facility]) Care Management  08/23/2015  Rebecca Hall 07/11/1931 [id number]   SUBJECTIVE:  Telephone call to patient regarding heart failure follow up. HIPAA verified with patient. Patient states she doing ok. Patient reports she has had some swelling in her left leg and foot. Patient states not swelling as much on the right. Patient reports she took an extra lasix and potassium last night as she has been instructed to do by her doctor. Patient states she can tell the swelling is coming down some. Patient denies any shortness of breath.  Patient states she continues to take her medication as prescribed. Patient states she continues to weigh daily and record weights.  RNCM advised patient to continue to keep follow up appointments with doctor and heart failure clinic for lab as instructed.  RNCM advised patient to continue to weigh daily and record.   ASSESSMENT.  Congestive heart failure follow up Per EPIC NOTE:Admitted 06/30/15 with weakness and dizziness and found to have AKI, hyperkalemia, and hypotension. (Creatinine 3.69 and K 7.4 on admit).   Patients potassium on 07/30/15 was 4.1  PLAN: RNCM will reach out to patient within 1 week.   Quinn Plowman RN,BSN,CCM [facility] Telephonic  [phone]

## 2015-09-14 ENCOUNTER — Other Ambulatory Visit: Payer: Self-pay

## 2015-09-14 NOTE — Patient Outreach (Signed)
[facility]) Care Management  09/14/2015  Rebecca Hall 10-13-1931 [id number]   Telephone call to patient regarding congestive heart failure follow up.  Unable to reach patient. HIPAA compliant voice message left with call back phone number.   PLAN;  RNCM will attempt 2nd telephone outreach within 1 week.   Quinn Plowman RN,BSN,CCM [facility] Telephonic  [phone]

## 2015-09-22 ENCOUNTER — Other Ambulatory Visit: Payer: Self-pay

## 2015-09-22 NOTE — Patient Outreach (Signed)
[facility]) Care Management  09/22/2015  Rebecca Hall May 05, 1932 [id number]  SUBJECTIVE:  Telephone call to patient for follow up. HIPAA verified with patient. Patient states she has had neck pain over the past few weeks. Patient states she has had this pain before. States her doctor told her previously that it was either muscle spasm or a pinched nerve. Patient states she took the pain medication her doctor had given her back in January for the same thing. Patient state the pain medication didn't help her much. Patient states she still had a previous prednisone prescription. Patient states she took 2 of the pills as they were prescribed and she felt better. Denies any neck pain at this time. Patient states she has an appointment scheduled with her primary MD in April 2017.   Patient states she has not had to take any extra lasix due to swelling. Patient denies any unusual swelling in her feet/legs. Patient denies shortness of breath. Patient states her weight today was 151 lbs.  Patient states she has a follow up appointment scheduled with the gastroenterologist for 10/11/15.  Patient states her hemocult test for her stool was negative.  RNCM advised patient to continue to monitor weight daily, take medications as prescribed by her doctor. RNCM advised patient to notify her doctor of any unusual signs and symptoms and report to doctor if weight gain of 3 lbs overnight or 5 lbs in a week.  Patient denies need for any additional education material. Patient states she feels like she is managing herself fine. RNCM contacted patients primary MD office and confirmed last primary MD office visit.  Patient in agreement with next follow up visit with RNCM. RNCM discussed with patient if no further problems or concerns at next outreach will close to [facility] care management services. Patient verbalized agreement.   ASSESSMENT:  Per patients primary MD office, patients last primary MD visit was  07/12/15, last visit with Dr. [name] 08/10/15, and last primary MD visit with nurse only was 08/18/15.   PLAN: RNCM will follow up with patient within 1 month for closure  Quinn Plowman RN,BSN,CCM [facility] Telephonic  [phone]

## 2015-10-09 ENCOUNTER — Other Ambulatory Visit (HOSPITAL_COMMUNITY): Payer: Self-pay | Admitting: Internal Medicine

## 2015-10-11 DIAGNOSIS — D509 Iron deficiency anemia, unspecified: Secondary | ICD-10-CM | POA: Diagnosis not present

## 2015-10-12 ENCOUNTER — Ambulatory Visit (HOSPITAL_BASED_OUTPATIENT_CLINIC_OR_DEPARTMENT_OTHER): Payer: PPO | Admitting: Oncology

## 2015-10-12 ENCOUNTER — Telehealth: Payer: Self-pay | Admitting: Oncology

## 2015-10-12 VITALS — BP 139/60 | HR 76 | Temp 97.8°F | Resp 18 | Wt 160.4 lb

## 2015-10-12 DIAGNOSIS — Z170 Estrogen receptor positive status [ER+]: Secondary | ICD-10-CM | POA: Diagnosis not present

## 2015-10-12 DIAGNOSIS — Z79811 Long term (current) use of aromatase inhibitors: Secondary | ICD-10-CM | POA: Diagnosis not present

## 2015-10-12 DIAGNOSIS — C50911 Malignant neoplasm of unspecified site of right female breast: Secondary | ICD-10-CM | POA: Diagnosis not present

## 2015-10-12 DIAGNOSIS — C50411 Malignant neoplasm of upper-outer quadrant of right female breast: Secondary | ICD-10-CM

## 2015-10-12 DIAGNOSIS — M8580 Other specified disorders of bone density and structure, unspecified site: Secondary | ICD-10-CM

## 2015-10-12 DIAGNOSIS — M810 Age-related osteoporosis without current pathological fracture: Secondary | ICD-10-CM

## 2015-10-12 NOTE — Telephone Encounter (Signed)
appt made and avs printed °

## 2015-10-12 NOTE — Progress Notes (Signed)
ID: Rebecca Hall OB: 1932/03/05  MR#: [id number]  [id number]#:[id number]  PCP: Rebecca Argyle, MD GYN:   SU: Rebecca Hall OTHER MD: Rebecca Hall, Rebecca Hall, Rebecca Hall  CHIEF COMPLAINT:  Estrogen receptor positive Breast Cancer  CURRENT TREATMENT: Anastrozole   HISTORY OF PRESENT ILLNESS: From the original intake note:  Rebecca Hall had routine screening mammography at [facility] 03/04/2013 showing a possible mass in the right breast, measuring 4 mm in diameter. Additional views 03/10/2013 confirmed an area of asymmetry in the right breast which was hypoechoic by ultrasonography. Biopsy of this area 03/15/2013 showed ([id number]) and invasive ductal carcinoma, grade 1 or 2, estrogen receptor 100% positive, progesterone receptor 100% positive, with a proliferation marker of 30% and HER-2 amplification by CISH with a ratio of 3.51 and an average HER-2 copy number of 6.85.  On 04/05/2013 the patient underwent bilateral breast MRIs. This showed in the right breast an 8 mm mass at the site of the known biopsy site, with surrounding known masslike enhancement. The entire area of concern measured 1.9 cm. There were no abnormal appearing lymph nodes of concern. In the left breast there were 2 areas of clumped non-masslike enhancement measuring 0.5 cm and 2.4 cm.  The patient's subsequent history is as detailed below  INTERVAL HISTORY: Rebecca Hall returns today for follow-up of her estrogen receptor positive breast cancer accompanied by her husband Rebecca Hall. She takes anastrozole daily, with very good tolerance. She does have some hot flashes. Her insomnia is not related to night sweats. Vaginal dryness is not a major concern. She obtains a drug at a very good price.  REVIEW OF SYSTEMS: Rebecca Hall has been found to be anemic. I do not have those labs but she is planning to have a colonoscopy under Rebecca Hall in the near future. She was also found to be hyperkalemic, requiring admission. That problem  has been resolved. She was evaluated by Rebecca Hall for her severe back pain and is planning back surgery in the near future. She can't stand for any length of time. She prefers not to take pain medication. Occasionally she will take a Tylenol. However basically this means she spends most of her time sitting, without moving. The pain involves the back but also the knees. She is behind on her eye exam. Her heart failure is being managed well and she is very appreciative to cardiology's efforts. Her daughter is controlled. She says her blood sugars are also controlled. A detailed review of systems today was otherwise stable  PAST MEDICAL HISTORY: Past Medical History  Diagnosis Date  . Hyperlipidemia   . Hypertension   . Chronic kidney disease   . Osteoporosis   . GERD (gastroesophageal reflux disease)   . IBS (irritable bowel syndrome)   . Arthritis   . Neuromuscular disorder (Rebecca Hall)     peripheral neuropathy feet  . History of lower GI bleeding   . Breast cancer ([place]) 03/15/13    right, 12 o'clock  . Diabetes mellitus without complication ([place])     PAST SURGICAL HISTORY: Past Surgical History  Procedure Laterality Date  . Cataract extraction  2009    both  . Skin cancer excision      head, face, hand ..multiple years  . Abdominal hysterectomy  1975    No salpingo-oophorectomy  . Tonsillectomy    . Colonoscopy    . Breast lumpectomy with needle localization and axillary sentinel lymph node bx Right 04/28/2013    Procedure: BREAST LUMPECTOMY WITH NEEDLE LOCALIZATION AND AXILLARY  SENTINEL LYMPH NODE BX;  Surgeon: Rebecca Bookbinder, MD;  Location: [place];  Service: General;  Laterality: Right;  . Cardiac catheterization N/A 01/26/2015    Procedure: Right/Left Heart Cath and Coronary Angiography;  Surgeon: Rebecca Artist, MD;  Location: [place] [facility];  Service: Cardiovascular;  Laterality: N/A;    FAMILY HISTORY Family History  Problem Relation Age of Onset  .  Cancer Maternal Aunt 80    colon cancer  . Cancer Maternal Uncle 65    lung  . Cancer Maternal Grandmother     pancreas  . Cerebral aneurysm Father    the patient's father died at the age of 19 from a subarachnoid hemorrhage. The patient's mother lived to be and 76.42 years old. She lived independently to be and. The patient had one brother, no sisters. There is a history of colon cancer or in a maternal aunts, diagnosed at age 57, and lung cancer in a maternal uncle diagnosed at age 9. There is no history of breast or ovarian cancer in the family  GYNECOLOGIC HISTORY:  (updated December 2000 410)  [name] can't remember when she had her first period. First live birth came at age 55 and she is [place] 2. She she took hormone replacement, namely estrogen, until 2014 when she was diagnosed with breast cancer.  SOCIAL HISTORY:  (Updated December 2014. 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 Her husband Rebecca Hall is retired, he is largely disabled secondary to back problems following a fall. Daughter Rebecca Hall is a retired Pharmacist, hospital living in [place]. Rebecca Hall's older son runs a Engineer, civil (consulting) and business in [place] and Rebecca Hall helps out with that. The patient's son Rebecca Hall lives in [place]. He is an Chief Financial Officer. Altogether [name] has 4 grandsons, 3 great-grandsons and one great-granddaughter    ADVANCED DIRECTIVES: In place   HEALTH MAINTENANCE:  (Updated January 2015) Social History  Substance Use Topics  . Smoking status: Never Smoker   . Smokeless tobacco: Never Used  . Alcohol Use: No     Colonoscopy: 2011  PAP: Status post hysterectomy  Bone density:Dec 2014, Rebecca Hall, osteopenia with T score of -1.9  Lipid panel: Not on file/Rebecca Hall   Allergies  Allergen Reactions  . Levaquin [Levofloxacin In D5w] Anaphylaxis and Nausea Only    Shaking real bad on medicaton  . Codeine Nausea And Vomiting    Pass out  . Herceptin  [Trastuzumab] Rash    Patient has developed extensive body skin rash following each Herceptin treatment of increased presence following each treatment  that cleared with oral steroids  . Sulfa Antibiotics Swelling    Current Outpatient Prescriptions  Medication Sig Dispense Refill  . allopurinol (ZYLOPRIM) 100 MG tablet Take 100 mg by mouth See admin instructions. Take with 300 mg tablet for a total of 423m daily    . allopurinol (ZYLOPRIM) 300 MG tablet Take 300 mg by mouth daily. Take with 100 mg tablet for total of 400 mg daily    . anastrozole (ARIMIDEX) 1 MG tablet Take 1 tablet (1 mg total) by mouth daily. 90 tablet 3  . aspirin 81 MG tablet Take 81 mg by mouth daily.    .Marland Kitchenatorvastatin (LIPITOR) 20 MG tablet Take 20 mg by mouth every morning.     . calcium carbonate (OS-CAL) 600 MG TABS tablet Take 600 mg by mouth 2 (two) times daily with a meal.     . carvedilol (  COREG) 3.125 MG tablet Take 1 tablet (3.125 mg total) by mouth 2 (two) times daily with a meal. 180 tablet 3  . furosemide (LASIX) 40 MG tablet Take 40 mg by mouth. Take 70m every morning and 223mevery evening    . gabapentin (NEURONTIN) 300 MG capsule TAKE 1 CAPSULE BY MOUTH DAILY AT BEDTIME 90 capsule 1  . glimepiride (AMARYL) 2 MG tablet Take 2 mg by mouth daily with breakfast.    . lisinopril (PRINIVIL,ZESTRIL) 10 MG tablet Take 10 mg by mouth daily.    . Marland Kitchenisinopril (PRINIVIL,ZESTRIL) 10 MG tablet TAKE 1 TABLET BY MOUTH DAILY AT BEDTIME 30 tablet 6  . loratadine (CLARITIN) 10 MG tablet Take 10 mg by mouth daily.    . minoxidil (ROGAINE) 2 % external solution Apply 1 application topically daily.     . Multiple Vitamin (MULTIVITAMIN WITH MINERALS) TABS tablet Take 1 tablet by mouth 2 (two) times daily.     . Marland Kitchenmeprazole (PRILOSEC) 20 MG capsule Take 20 mg by mouth daily.     . potassium chloride (K-DUR) 10 MEQ tablet Take 2 tablets (20 mEq total) by mouth daily. 60 tablet 3  . Vitamin D, Ergocalciferol, (DRISDOL) 50000  UNITS CAPS capsule Take 50,000 Units by mouth every 14 (fourteen) days.     . vitamin E 100 UNIT capsule Take 100 Units by mouth daily.     No current facility-administered medications for this visit.    OBJECTIVE: Older white woman who appears stated age There were no vitals filed for this visit.   There is no weight on file to calculate BMI.    ECOG FS:1 - Symptomatic but completely ambulatory There were no vitals filed for this visit.   Sclerae unicteric, EOMs intact Oropharynx clear, dentition in good repair No cervical or supraclavicular adenopathy Lungs no rales or rhonchi Heart regular rate and rhythm Abd soft, obese, nontender, positive bowel sounds MSK no focal spinal tenderness, no upper extremity lymphedema Neuro: nonfocal, well oriented, appropriate affect Breasts: The right breast is status post lumpectomy, with no evidence of disease recurrence. The right axilla is benign. The left breast is unremarkable.   LAB RESULTS:   Lab Results  Component Value Date   WBC 4.7 07/02/2015   NEUTROABS 2.3 07/02/2015   HGB 8.7* 07/02/2015   HCT 27.0* 07/02/2015   MCV 98.2 07/02/2015   PLT 145* 07/02/2015      Chemistry      Component Value Date/Time   NA 139 07/31/2015 1047   NA 141 06/09/2015 1433   K 4.1 07/31/2015 1047   K 2.7 Repeated and Verified* 06/09/2015 1433   CL 104 07/31/2015 1047   CO2 19* 07/31/2015 1047   CO2 29 06/09/2015 1433   BUN 16 07/31/2015 1047   BUN 21.0 06/09/2015 1433   CREATININE 1.34* 07/31/2015 1047   CREATININE 1.4* 06/09/2015 1433      Component Value Date/Time   CALCIUM 9.5 07/31/2015 1047   CALCIUM 9.5 06/09/2015 1433   ALKPHOS 67 07/02/2015 0447   ALKPHOS 85 06/09/2015 1433   AST 15 07/02/2015 0447   AST 16 06/09/2015 1433   ALT 13* 07/02/2015 0447   ALT 15 06/09/2015 1433   BILITOT 0.5 07/02/2015 0447   BILITOT 0.36 06/09/2015 1433       STUDIES: CLINICAL DATA: [age]ear old female for annual bilateral  mammograms. History of right breast cancer and lumpectomy in 2014  EXAM: DIGITAL DIAGNOSTIC BILATERAL MAMMOGRAM WITH 3D TOMOSYNTHESIS AND CAD  COMPARISON: Previous  exam(s).  ACR Breast Density Category b: There are scattered areas of fibroglandular density.  FINDINGS: 2D and 3D CC and MLO views of both breasts and a magnification view of the right lumpectomy site are performed.  Surgical scarring within the retroareolar right breast identified.  There is no evidence of suspicious mass, nonsurgical distortion or worrisome calcifications bilaterally.  Mammographic images were processed with CAD.  IMPRESSION: No mammographic evidence of breast malignancy.  Right breast scarring.  RECOMMENDATION: Bilateral diagnostic mammograms in 1 year.  I have discussed the findings and recommendations with the patient. Results were also provided in writing at the conclusion of the visit. If applicable, a reminder letter will be sent to the patient regarding the next appointment.  BI-RADS CATEGORY 2: Benign.   Electronically Signed  By: Margarette Canada M.D.  On: 04/28/2015 13:51  Transthoracic Echocardiography  Patient:  Xandria, Gallaga MR #:    [id number] Study Date: 06/12/2015 Gender:   F Age:    49 Height:   154.9 cm Weight:   72.6 kg BSA:    1.79 m^2 Pt. Status: Room:  ATTENDING  [name] [id number] ORDERING   Hall, Glynis Smiles  Glori Bickers SONOGRAPHER Jimmy Reel, RDCS PERFORMING  [facility], Outpatient  cc:  ------------------------------------------------------------------- LV EF: 55% -  60%    ASSESSMENT: 82 y.o. [place] woman, status post right breast biopsy 03/15/2013 for a clinical T1c N0, stage IA invasive ductal carcinoma, grade 1 or 2, estrogen receptor 100% positive, progesterone receptor 100% positive, with an MIB-1 of 30%, and HER-2 amplified by CISH, with a ratio of 3.51 and an average  HER-2 copy number Damita Lack of 6.85  (1) biopsy of 2 suspicious areas in the left breast both showed no evidence of malignancy  (2) right lumpectomy and sentinel lymph node sampling 04/28/2013 showed a pT1b pN0, stage IA invasive ductal carcinoma, grade 2, with negative margins  (a) did not need radiation therapy  (3) trastuzumab started 05/31/2013, given every 3 weeks for total of 9 doses; held as of 07/16/2013 after 3 doses because of possible allergic dermatitis associated with the drug.   (4) anastrozole started 05/08/2013  (a) dexa scan 06/10/2013 showed osteopenia with a T score of -2.1  (b) zolendronate started 09/13/2013, repeated 04/17/2015  (c) switched to denosumab/Prolia as of April 2017  (5) congestive heart failure in the setting of sepsis August 2016, with normal repeat echo December 2016   PLAN: Braylinn is now 2-1/2 years out from definitive surgery for her early stage breast cancer, with no evidence of disease recurrence. This is very favorable.  She is tolerating the anastrozole well. The one concern is osteoporosis. She had been on zolendronate and she did well with that but given her concerns regarding her renal function we are switching her to Prolia. Today we discussed the possible toxicities, side effects and complications of that agent. She will receive the first dose next week.  She will then see [place] again in 6 months with a second Prolia dose at that time. We can consider going to yearly thereafter.  Otherwise she is considering back surgery and colonoscopy. She will let us know results.  She knows to call for any problems that may develop before the next visit.   Chauncey Cruel, MD   10/12/2015 6:32 PM

## 2015-10-13 NOTE — Progress Notes (Signed)
Vital signs entered per request from Dr. Magrinat.  Vitals obtained by NT but NT unable to open encounter for charting purposes.   

## 2015-10-16 ENCOUNTER — Encounter (HOSPITAL_COMMUNITY): Payer: PPO | Admitting: Internal Medicine

## 2015-10-16 ENCOUNTER — Encounter (HOSPITAL_COMMUNITY): Payer: Self-pay

## 2015-10-16 ENCOUNTER — Ambulatory Visit (HOSPITAL_COMMUNITY)
Admission: RE | Admit: 2015-10-16 | Discharge: 2015-10-16 | Disposition: A | Discharge status: Home / Self Care | Payer: PPO | Source: Ambulatory Visit | Attending: Internal Medicine | Admitting: Internal Medicine

## 2015-10-16 VITALS — BP 128/66 | HR 75 | Wt 159.2 lb

## 2015-10-16 DIAGNOSIS — K219 Gastro-esophageal reflux disease without esophagitis: Secondary | ICD-10-CM | POA: Insufficient documentation

## 2015-10-16 DIAGNOSIS — I5042 Chronic combined systolic (congestive) and diastolic (congestive) heart failure: Secondary | ICD-10-CM | POA: Diagnosis not present

## 2015-10-16 DIAGNOSIS — I2510 Atherosclerotic heart disease of native coronary artery without angina pectoris: Secondary | ICD-10-CM

## 2015-10-16 DIAGNOSIS — C50411 Malignant neoplasm of upper-outer quadrant of right female breast: Secondary | ICD-10-CM | POA: Diagnosis not present

## 2015-10-16 DIAGNOSIS — I130 Hypertensive heart and chronic kidney disease with heart failure and stage 1 through stage 4 chronic kidney disease, or unspecified chronic kidney disease: Secondary | ICD-10-CM | POA: Insufficient documentation

## 2015-10-16 DIAGNOSIS — E785 Hyperlipidemia, unspecified: Secondary | ICD-10-CM | POA: Insufficient documentation

## 2015-10-16 DIAGNOSIS — E1122 Type 2 diabetes mellitus with diabetic chronic kidney disease: Secondary | ICD-10-CM | POA: Insufficient documentation

## 2015-10-16 DIAGNOSIS — Z7982 Long term (current) use of aspirin: Secondary | ICD-10-CM | POA: Diagnosis not present

## 2015-10-16 DIAGNOSIS — N183 Chronic kidney disease, stage 3 unspecified: Secondary | ICD-10-CM

## 2015-10-16 DIAGNOSIS — K589 Irritable bowel syndrome without diarrhea: Secondary | ICD-10-CM | POA: Diagnosis not present

## 2015-10-16 DIAGNOSIS — Z79899 Other long term (current) drug therapy: Secondary | ICD-10-CM | POA: Insufficient documentation

## 2015-10-16 DIAGNOSIS — I5022 Chronic systolic (congestive) heart failure: Secondary | ICD-10-CM | POA: Diagnosis not present

## 2015-10-16 DIAGNOSIS — I428 Other cardiomyopathies: Secondary | ICD-10-CM | POA: Insufficient documentation

## 2015-10-16 DIAGNOSIS — Z7984 Long term (current) use of oral hypoglycemic drugs: Secondary | ICD-10-CM | POA: Insufficient documentation

## 2015-10-16 DIAGNOSIS — R0683 Snoring: Secondary | ICD-10-CM | POA: Diagnosis not present

## 2015-10-16 DIAGNOSIS — Z853 Personal history of malignant neoplasm of breast: Secondary | ICD-10-CM | POA: Diagnosis not present

## 2015-10-16 DIAGNOSIS — E1142 Type 2 diabetes mellitus with diabetic polyneuropathy: Secondary | ICD-10-CM | POA: Diagnosis not present

## 2015-10-16 LAB — BASIC METABOLIC PANEL
Anion gap: 14 (ref 5–15)
BUN: 24 mg/dL — ABNORMAL HIGH (ref 6–20)
CO2: 23 mmol/L (ref 22–32)
Calcium: 9.8 mg/dL (ref 8.9–10.3)
Chloride: 99 mmol/L — ABNORMAL LOW (ref 101–111)
Creatinine, Ser: 1.41 mg/dL — ABNORMAL HIGH (ref 0.44–1.00)
GFR calc Af Amer: 39 mL/min — ABNORMAL LOW (ref >60)
GFR calc non Af Amer: 34 mL/min — ABNORMAL LOW (ref >60)
Glucose, Bld: 178 mg/dL — ABNORMAL HIGH (ref 65–99)
Potassium: 4.5 mmol/L (ref 3.5–5.1)
Sodium: 136 mmol/L (ref 135–145)

## 2015-10-16 NOTE — Progress Notes (Signed)
Advanced Heart Failure Medication Review by a Pharmacist  Does the patient  feel that his/her medications are working for him/her?  yes  Has the patient been experiencing any side effects to the medications prescribed?  no  Does the patient measure his/her own blood pressure or blood glucose at home?  yes   Does the patient have any problems obtaining medications due to transportation or finances?   no  Understanding of regimen: excellent Understanding of indications: excellent Potential of compliance: excellent Patient understands to avoid NSAIDs. Patient understands to avoid decongestants.   Pharmacist comments: Reports taking all morning doses prior to visit today. Weighs daily. States she will take an extra 20mg  of furosemide on days in which she feels her edema has worsened. Reports using an extra dose of furosemide the last two days in addition to 40 mEq KCl on those days. Reports no noticeable adverse effects however, did have questions/concerns regarding zoledronic acid and denosumab. Discussed indication, basic pharmacology, and potential adverse effects with patient. No specific questions otherwise.    Time with patient: 5min Preparation and documentation time: 22min Total time: 62min  Stephens November, PharmD Clinical Pharmacy Resident 3:42 PM, 10/16/2015

## 2015-10-16 NOTE — Addendum Note (Signed)
Encounter addended by: Judieth Keens, [place] on: 10/16/2015  3:42 PM<BR>     Documentation filed: Notes Section

## 2015-10-16 NOTE — Patient Instructions (Signed)
Routine lab work today. Will notify you of abnormal results, otherwise no news is good news!  Follow up 4 months with Dr. Bensimhon.  Do the following things EVERYDAY: 1) Weigh yourself in the morning before breakfast. Write it down and keep it in a log. 2) Take your medicines as prescribed 3) Eat low salt foods-Limit salt (sodium) to 2000 mg per day.  4) Stay as active as you can everyday 5) Limit all fluids for the day to less than 2 liters 

## 2015-10-16 NOTE — Progress Notes (Signed)
Patient ID: Rebecca Hall, female   DOB: 1932/05/29, 82 y.o.   MRN: [id number]   Advanced Heart Failure Clinic Note   Patient ID: Rebecca Hall, female   DOB: 11-Oct-1931, 82 y.o.   MRN: [id number] PCP: Dr Felipa Eth.  Primary HF: Dr Haroldine Laws Oncologist: Dr Jana Hakim  HPI: Rebecca Hall is an 82 y.o. female with history of Breast Cancer treated with 3 doses only of herceptin in 2014, CKD, HTN, HLD, Osteoporosis, GERD, Hx of GI Bleed, and DM.  Admitted to [facility] 8/16 with hypovolemic shock, AKI creatinine 4.7. After fluid resuscitation developed pulmonary edema. Found to have new onset LV dysfunction. Had cath as noted below with 1v CAD with 60 proximal LAD and NICM. Discharged on lasix, carvedilol, and entresto. Discharge weight was 156 pounds.   Admitted 06/30/15 with weakness and dizziness and found to have AKI, hyperkalemia, and hypotension. (Creatinine 3.69 and K 7.4 on admit) Pt had stopped spiro several days prior with dizziness. K treated in ED as well as inpatient setting. Received IV fluid for hypotension.    She returns HF follow up. Overall feeling ok. Denies SOB/PND/Orthopnea. Taking all medications. Weight at home 151-156 pounds. Takes extra lasix for lower extremity edema. Does not want sleep study. Says she is claustrophobic would not use CPAP.    [facility] 01/27/2015  RA = 11 RV = 42/8/15 PA = 38/8 (24) PCW = 19 Fick cardiac output/index = 5.2/3.0 Thermo CO/CI = 5.7/3.2 PVR = 1.0 WU FA sat = 93% PA sat = 51%, 51% Ao Pressure: 133/60 (85) LV Pressure: 133/9/17 No aortic stenosis  Assessment: 1) Moderate 1v CAD with 60% proximal LAD lesion 2) Non-ischemic CM with EF 30-35% by echo 3) Mildly elevated R-sided pressures with normal cardiac output  Labs:   06/09/15 K 2.7, Creatinine 1.4 07/14/2015: K 4.2 Creatinine 2.35  07/31/2015: K 4.1 Creatinine 1.34     ROS: All systems negative except as listed in HPI, PMH and Problem List.  SH:  Social History   Social History  .  Marital Status: Married    Spouse Name: N/A  . Number of Children: N/A  . Years of Education: N/A   Occupational History  . Not on file.   Social History Main Topics  . Smoking status: Never Smoker   . Smokeless tobacco: Never Used  . Alcohol Use: No  . Drug Use: No  . Sexual Activity: Not on file     Comment: first live birth age 27, P 2, Estrogen    Other Topics Concern  . Not on file   Social History Narrative    FH:  Family History  Problem Relation Age of Onset  . Cancer Maternal Aunt 80    colon cancer  . Cancer Maternal Uncle 65    lung  . Cancer Maternal Grandmother     pancreas  . Cerebral aneurysm Father     Past Medical History  Diagnosis Date  . Hyperlipidemia   . Hypertension   . Chronic kidney disease   . Osteoporosis   . GERD (gastroesophageal reflux disease)   . IBS (irritable bowel syndrome)   . Arthritis   . Neuromuscular disorder ([place])     peripheral neuropathy feet  . History of lower GI bleeding   . Breast cancer ([place]) 03/15/13    right, 12 o'clock  . Diabetes mellitus without complication [facility])     Current Outpatient Prescriptions  Medication Sig Dispense Refill  . allopurinol (ZYLOPRIM) 100 MG tablet Take  100 mg by mouth See admin instructions. Take with 300 mg tablet for a total of 400mg  daily    . allopurinol (ZYLOPRIM) 300 MG tablet Take 300 mg by mouth daily. Take with 100 mg tablet for total of 400 mg daily    . anastrozole (ARIMIDEX) 1 MG tablet Take 1 tablet (1 mg total) by mouth daily. 90 tablet 3  . aspirin 81 MG tablet Take 81 mg by mouth daily.    Marland Kitchen atorvastatin (LIPITOR) 20 MG tablet Take 20 mg by mouth every morning.     . calcium carbonate (OS-CAL) 600 MG TABS tablet Take 600 mg by mouth 2 (two) times daily with a meal.     . carvedilol (COREG) 3.125 MG tablet Take 1 tablet (3.125 mg total) by mouth 2 (two) times daily with a meal. 180 tablet 3  . furosemide (LASIX) 40 MG tablet Take 40 mg by mouth. Take 40mg  every  morning and an additional 20mg  every evening as needed for swelling    . gabapentin (NEURONTIN) 300 MG capsule TAKE 1 CAPSULE BY MOUTH DAILY AT BEDTIME 90 capsule 1  . glimepiride (AMARYL) 2 MG tablet Take 2 mg by mouth daily with breakfast.    . lisinopril (PRINIVIL,ZESTRIL) 10 MG tablet TAKE 1 TABLET BY MOUTH DAILY AT BEDTIME 30 tablet 6  . loratadine (CLARITIN) 10 MG tablet Take 10 mg by mouth daily.    . Melatonin 5 MG TABS Take 5 mg by mouth at bedtime as needed (As needed for sleep).    . minoxidil (ROGAINE) 2 % external solution Apply 1 application topically daily.     . Multiple Vitamin (MULTIVITAMIN WITH MINERALS) TABS tablet Take 1 tablet by mouth 2 (two) times daily.     Marland Kitchen omeprazole (PRILOSEC) 20 MG capsule Take 20 mg by mouth daily.     . potassium chloride (K-DUR,KLOR-CON) 10 MEQ tablet Take 10 mEq by mouth daily. Take an additional two tablets (20 mEq) on days that evening furosemide is used.    . Vitamin D, Ergocalciferol, (DRISDOL) 50000 UNITS CAPS capsule Take 50,000 Units by mouth every 14 (fourteen) days.     . vitamin E 100 UNIT capsule Take 100 Units by mouth daily.    . traMADol (ULTRAM) 50 MG tablet Take 50 mg by mouth every 6 (six) hours as needed.     No current facility-administered medications for this encounter.    Filed Vitals:   10/16/15 1433  BP: 128/66  Pulse: 75  Weight: 159 lb 4 oz (72.235 kg)  SpO2: 98%   BP 94/56  Wt Readings from Last 3 Encounters:  10/16/15 159 lb 4 oz (72.235 kg)  10/12/15 160 lb 6.4 oz (72.757 kg)  09/22/15 151 lb (68.493 kg)    PHYSICAL EXAM:  General:  Well appearing. NAD. Husband present. Ambulated in the clinic without difficulty.  HEENT: normal Neck: supple. JVP 6-7. Carotids 2+ bilaterally; no bruits. No thyromegaly or lymphadenopathy noted. Cor: PMI normal. RRR. No M/G/R Lungs: Clear, no resp distress Abdomen: soft, non-tender, non-distended, no HSM. No bruits or masses. +BS  Extremities: no cyanosis, clubbing,  rash. R and LLE trace edema.  Neuro: alert & orientedx3, cranial nerves grossly intact. Moves all 4 extremities w/o difficulty. Affect pleasant.   ASSESSMENT & PLAN: 1. Chronic combined systolic/diastolic HF: NICM LHC [id number] Echo 06/12/15 EF 55-60%. - NYHA II Volume status stable on exam. Continue lasix 40 mg daily with extra 20 mg as needed for lower extremity edema. Continue 20  meq potassium daily. May need to stop if K hight.   -Continue carvedilol 3.125 mg twice a day.  - Continue lisinopril 10 mg. (intolerant entresto with hypotension and syncope) . 2. CKD, Stage 3  3. Hx of Breast Cancer - Only received 3 doses of Herceptin in 2014. Stopped due to possible allergy. She continues on anastrozole.  4. DM2 5. HTN- Stable. Continue current regimen.  6. HLD- Continue lipitor 20 mg daily 7. Heavy snoring- She declines sleep study.   8. CAD - 1v. --[facility] 01/27/2015.  Nonobstructive. No CP. Continue lipitor 20 mg and ASA 81 mg daily.   Follow up 4-6 months with Dr Haroldine Laws     Harlee Pursifull NP-C  3:24 PM

## 2015-10-18 ENCOUNTER — Ambulatory Visit (HOSPITAL_BASED_OUTPATIENT_CLINIC_OR_DEPARTMENT_OTHER): Payer: PPO

## 2015-10-18 ENCOUNTER — Other Ambulatory Visit: Payer: Self-pay

## 2015-10-18 ENCOUNTER — Other Ambulatory Visit: Payer: Self-pay | Admitting: Oncology

## 2015-10-18 ENCOUNTER — Other Ambulatory Visit (HOSPITAL_BASED_OUTPATIENT_CLINIC_OR_DEPARTMENT_OTHER): Payer: PPO

## 2015-10-18 VITALS — BP 130/61 | HR 62 | Temp 97.7°F

## 2015-10-18 DIAGNOSIS — M8580 Other specified disorders of bone density and structure, unspecified site: Secondary | ICD-10-CM

## 2015-10-18 DIAGNOSIS — C50411 Malignant neoplasm of upper-outer quadrant of right female breast: Secondary | ICD-10-CM

## 2015-10-18 DIAGNOSIS — M810 Age-related osteoporosis without current pathological fracture: Secondary | ICD-10-CM

## 2015-10-18 LAB — COMPREHENSIVE METABOLIC PANEL
ALT: 19 U/L (ref 0–55)
AST: 17 U/L (ref 5–34)
Albumin: 3.7 g/dL (ref 3.5–5.0)
Alkaline Phosphatase: 76 U/L (ref 40–150)
Anion Gap: 9 mEq/L (ref 3–11)
BUN: 26.8 mg/dL — ABNORMAL HIGH (ref 7.0–26.0)
CO2: 25 mEq/L (ref 22–29)
Calcium: 9.8 mg/dL (ref 8.4–10.4)
Chloride: 103 mEq/L (ref 98–109)
Creatinine: 1.7 mg/dL — ABNORMAL HIGH (ref 0.6–1.1)
EGFR: 28 mL/min/{1.73_m2} — ABNORMAL LOW (ref >90)
Glucose: 173 mg/dl — ABNORMAL HIGH (ref 70–140)
Potassium: 4.6 mEq/L (ref 3.5–5.1)
Sodium: 137 mEq/L (ref 136–145)
Total Bilirubin: 0.42 mg/dL (ref 0.20–1.20)
Total Protein: 6.7 g/dL (ref 6.4–8.3)

## 2015-10-18 LAB — CBC WITH DIFFERENTIAL/PLATELET
BASO%: 0.2 % (ref 0.0–2.0)
Basophils Absolute: 0 10*3/uL (ref 0.0–0.1)
EOS%: 2.6 % (ref 0.0–7.0)
Eosinophils Absolute: 0.1 10*3/uL (ref 0.0–0.5)
HCT: 33.7 % — ABNORMAL LOW (ref 34.8–46.6)
HGB: 11.4 g/dL — ABNORMAL LOW (ref 11.6–15.9)
LYMPH%: 36.7 % (ref 14.0–49.7)
MCH: 33.1 pg (ref 25.1–34.0)
MCHC: 33.8 g/dL (ref 31.5–36.0)
MCV: 98 fL (ref 79.5–101.0)
MONO#: 0.5 10*3/uL (ref 0.1–0.9)
MONO%: 11.2 % (ref 0.0–14.0)
NEUT#: 2.2 10*3/uL (ref 1.5–6.5)
NEUT%: 49.3 % (ref 38.4–76.8)
Platelets: 107 10*3/uL — ABNORMAL LOW (ref 145–400)
RBC: 3.44 10*6/uL — ABNORMAL LOW (ref 3.70–5.45)
RDW: 14.2 % (ref 11.2–14.5)
WBC: 4.6 10*3/uL (ref 3.9–10.3)
lymph#: 1.7 10*3/uL (ref 0.9–3.3)

## 2015-10-18 MED ORDER — DENOSUMAB 60 MG/ML ~~LOC~~ SOLN
60.0000 mg | Freq: Once | SUBCUTANEOUS | Status: AC
  Administered 2015-10-18: 60 mg via SUBCUTANEOUS
  Filled 2015-10-18: qty 1

## 2015-10-18 NOTE — Patient Outreach (Signed)
[facility]) Care Management  [place]  10/18/2015   DINEEN CONRADT 10-10-1931 [id number]  Subjective: Telephone call to patient for case closure. HIPAA verified with patient. Patient states, " I think I am doing quite well." Patient states she was seen at the cancer center approximately 1 week ago.  Patient states she was seen at the heart clinic this week.  Patient states, " I had blood work done and I believe no news is good news." Patient reports no change in her plan of care.  Patient states she continues to weigh daily and check her blood pressure and pulse. Patient denies any shortness of breath, swelling, cough, or increased weight gain. Patient states she has a follow up visit scheduled with her primary MD in June 2016.  Patient verbalized agreement with closure to [facility] care management services. Patient denies having any further nursing needs, questions or concerns.  Patient states examples of heart failure symptoms:  Increased weight gain of 3lbs over night/ 5lbs in a week.  Increased shortness of breath and swelling.  Patient states no changes in her medications. Patient states she takes her medications as prescribed.   Objective: n/a  Encounter Medications:  Outpatient Encounter Prescriptions as of 10/18/2015  Medication Sig Note  . allopurinol (ZYLOPRIM) 100 MG tablet Take 100 mg by mouth See admin instructions. Take with 300 mg tablet for a total of 490m daily 06/12/2015: ...  . allopurinol (ZYLOPRIM) 300 MG tablet Take 300 mg by mouth daily. Take with 100 mg tablet for total of 400 mg daily   . anastrozole (ARIMIDEX) 1 MG tablet Take 1 tablet (1 mg total) by mouth daily.   .Marland Kitchenaspirin 81 MG tablet Take 81 mg by mouth daily.   .Marland Kitchenatorvastatin (LIPITOR) 20 MG tablet Take 20 mg by mouth every morning.    . calcium carbonate (OS-CAL) 600 MG TABS tablet Take 600 mg by mouth 2 (two) times daily with a meal.    . carvedilol (COREG) 3.125 MG tablet Take 1 tablet  (3.125 mg total) by mouth 2 (two) times daily with a meal.   . furosemide (LASIX) 40 MG tablet Take 40 mg by mouth. Take 418mevery morning and an additional 2068mvery evening as needed for swelling   . gabapentin (NEURONTIN) 300 MG capsule TAKE 1 CAPSULE BY MOUTH DAILY AT BEDTIME   . glimepiride (AMARYL) 2 MG tablet Take 2 mg by mouth daily with breakfast.   . lisinopril (PRINIVIL,ZESTRIL) 10 MG tablet TAKE 1 TABLET BY MOUTH DAILY AT BEDTIME   . loratadine (CLARITIN) 10 MG tablet Take 10 mg by mouth daily.   . Melatonin 5 MG TABS Take 5 mg by mouth at bedtime as needed (As needed for sleep).   . minoxidil (ROGAINE) 2 % external solution Apply 1 application topically daily.    . Multiple Vitamin (MULTIVITAMIN WITH MINERALS) TABS tablet Take 1 tablet by mouth 2 (two) times daily.    . oMarland Kitcheneprazole (PRILOSEC) 20 MG capsule Take 20 mg by mouth daily.    . potassium chloride (K-DUR,KLOR-CON) 10 MEQ tablet Take 10 mEq by mouth daily. Take an additional two tablets (20 mEq) on days that evening furosemide is used.   . traMADol (ULTRAM) 50 MG tablet Take 50 mg by mouth every 6 (six) hours as needed. 10/16/2015: Received from: External Pharmacy Received Sig:   . Vitamin D, Ergocalciferol, (DRISDOL) 50000 UNITS CAPS capsule Take 50,000 Units by mouth every 14 (fourteen) days.    .Marland Kitchen  vitamin E 100 UNIT capsule Take 100 Units by mouth daily.    No facility-administered encounter medications on file as of 10/18/2015.    Functional Status:  In your present state of health, do you have any difficulty performing the following activities: 07/01/2015 03/29/2015  Hearing? N N  Vision? N N  Difficulty concentrating or making decisions? N N  Walking or climbing stairs? Y N  Dressing or bathing? N N  Doing errands, shopping? N N  Preparing Food and eating ? - N  Using the Toilet? - N  In the past six months, have you accidently leaked urine? - N  Do you have problems with loss of bowel control? - N  Managing your  Medications? - N  Managing your Finances? - N  Housekeeping or managing your Housekeeping? - N    Fall/Depression Screening: PHQ 2/9 Scores 03/29/2015  PHQ - 2 Score 0    Assessment: Patient self managing heart failure. Patient is able to state at least 3 heart failure symptoms. Patient continues to keep her doctors appointment and takes her medications as prescribed.   Plan: RNCM will refer patient to Josepha Pigg to close due to goals of program being met. RNCM will notify patients primary MD of closure.   Quinn Plowman RN,BSN,CCM [facility] Telephonic  [phone]

## 2015-10-18 NOTE — Patient Instructions (Signed)
Denosumab injection  What is this medicine?  DENOSUMAB (den oh sue mab) slows bone breakdown. Prolia is used to treat osteoporosis in women after menopause and in men. Xgeva is used to prevent bone fractures and other bone problems caused by cancer bone metastases. Xgeva is also used to treat giant cell tumor of the bone.  This medicine may be used for other purposes; ask your health care provider or pharmacist if you have questions.  What should I tell my health care provider before I take this medicine?  They need to know if you have any of these conditions:  -dental disease  -eczema  -infection or history of infections  -kidney disease or on dialysis  -low blood calcium or vitamin D  -malabsorption syndrome  -scheduled to have surgery or tooth extraction  -taking medicine that contains denosumab  -thyroid or parathyroid disease  -an unusual reaction to denosumab, other medicines, foods, dyes, or preservatives  -pregnant or trying to get pregnant  -breast-feeding  How should I use this medicine?  This medicine is for injection under the skin. It is given by a health care professional in a hospital or clinic setting.  If you are getting Prolia, a special MedGuide will be given to you by the pharmacist with each prescription and refill. Be sure to read this information carefully each time.  For Prolia, talk to your pediatrician regarding the use of this medicine in children. Special care may be needed. For Xgeva, talk to your pediatrician regarding the use of this medicine in children. While this drug may be prescribed for children as young as 13 years for selected conditions, precautions do apply.  Overdosage: If you think you have taken too much of this medicine contact a poison control center or emergency room at once.  NOTE: This medicine is only for you. Do not share this medicine with others.  What if I miss a dose?  It is important not to miss your dose. Call your doctor or health care professional if you are  unable to keep an appointment.  What may interact with this medicine?  Do not take this medicine with any of the following medications:  -other medicines containing denosumab  This medicine may also interact with the following medications:  -medicines that suppress the immune system  -medicines that treat cancer  -steroid medicines like prednisone or cortisone  This list may not describe all possible interactions. Give your health care provider a list of all the medicines, herbs, non-prescription drugs, or dietary supplements you use. Also tell them if you smoke, drink alcohol, or use illegal drugs. Some items may interact with your medicine.  What should I watch for while using this medicine?  Visit your doctor or health care professional for regular checks on your progress. Your doctor or health care professional may order blood tests and other tests to see how you are doing.  Call your doctor or health care professional if you get a cold or other infection while receiving this medicine. Do not treat yourself. This medicine may decrease your body's ability to fight infection.  You should make sure you get enough calcium and vitamin D while you are taking this medicine, unless your doctor tells you not to. Discuss the foods you eat and the vitamins you take with your health care professional.  See your dentist regularly. Brush and floss your teeth as directed. Before you have any dental work done, tell your dentist you are receiving this medicine.  Do   not become pregnant while taking this medicine or for 5 months after stopping it. Women should inform their doctor if they wish to become pregnant or think they might be pregnant. There is a potential for serious side effects to an unborn child. Talk to your health care professional or pharmacist for more information.  What side effects may I notice from receiving this medicine?  Side effects that you should report to your doctor or health care professional as soon as  possible:  -allergic reactions like skin rash, itching or hives, swelling of the face, lips, or tongue  -breathing problems  -chest pain  -fast, irregular heartbeat  -feeling faint or lightheaded, falls  -fever, chills, or any other sign of infection  -muscle spasms, tightening, or twitches  -numbness or tingling  -skin blisters or bumps, or is dry, peels, or red  -slow healing or unexplained pain in the mouth or jaw  -unusual bleeding or bruising  Side effects that usually do not require medical attention (Report these to your doctor or health care professional if they continue or are bothersome.):  -muscle pain  -stomach upset, gas  This list may not describe all possible side effects. Call your doctor for medical advice about side effects. You may report side effects to FDA at 1-800-FDA-1088.  Where should I keep my medicine?  This medicine is only given in a clinic, doctor's office, or other health care setting and will not be stored at home.  NOTE: This sheet is a summary. It may not cover all possible information. If you have questions about this medicine, talk to your doctor, pharmacist, or health care provider.      2016, [employer] Standard. (2011-12-09 12:37:47)

## 2015-10-23 ENCOUNTER — Other Ambulatory Visit: Payer: Self-pay | Admitting: Oncology

## 2015-10-23 ENCOUNTER — Ambulatory Visit: Payer: Self-pay

## 2015-10-30 DIAGNOSIS — M4806 Spinal stenosis, lumbar region: Secondary | ICD-10-CM | POA: Diagnosis not present

## 2015-11-06 ENCOUNTER — Telehealth (HOSPITAL_COMMUNITY): Payer: Self-pay | Admitting: Vascular Surgery

## 2015-11-06 MED ORDER — LISINOPRIL 10 MG PO TABS: 10.0000 mg | ORAL_TABLET | Freq: Every day | ORAL | Status: DC

## 2015-11-06 NOTE — Telephone Encounter (Signed)
PT called she would like her lisinpril to be refilled with a 90 day supply please advise

## 2015-11-06 NOTE — Telephone Encounter (Signed)
rx sent in, pt aware

## 2015-11-14 DIAGNOSIS — C50911 Malignant neoplasm of unspecified site of right female breast: Secondary | ICD-10-CM | POA: Diagnosis not present

## 2015-12-15 DIAGNOSIS — E119 Type 2 diabetes mellitus without complications: Secondary | ICD-10-CM | POA: Diagnosis not present

## 2015-12-15 DIAGNOSIS — H43813 Vitreous degeneration, bilateral: Secondary | ICD-10-CM | POA: Diagnosis not present

## 2015-12-15 DIAGNOSIS — Z0100 Encounter for examination of eyes and vision without abnormal findings: Secondary | ICD-10-CM | POA: Diagnosis not present

## 2015-12-15 DIAGNOSIS — H01004 Unspecified blepharitis left upper eyelid: Secondary | ICD-10-CM | POA: Diagnosis not present

## 2015-12-20 ENCOUNTER — Observation Stay (HOSPITAL_COMMUNITY)
Admission: EM | Admit: 2015-12-20 | Discharge: 2015-12-21 | Disposition: A | Discharge status: Home / Self Care | Payer: PPO | Attending: Internal Medicine | Admitting: Internal Medicine

## 2015-12-20 ENCOUNTER — Encounter (HOSPITAL_COMMUNITY): Payer: Self-pay

## 2015-12-20 DIAGNOSIS — M1990 Unspecified osteoarthritis, unspecified site: Secondary | ICD-10-CM | POA: Diagnosis not present

## 2015-12-20 DIAGNOSIS — I2510 Atherosclerotic heart disease of native coronary artery without angina pectoris: Secondary | ICD-10-CM | POA: Diagnosis not present

## 2015-12-20 DIAGNOSIS — I129 Hypertensive chronic kidney disease with stage 1 through stage 4 chronic kidney disease, or unspecified chronic kidney disease: Secondary | ICD-10-CM | POA: Diagnosis not present

## 2015-12-20 DIAGNOSIS — Z882 Allergy status to sulfonamides status: Secondary | ICD-10-CM | POA: Diagnosis not present

## 2015-12-20 DIAGNOSIS — D649 Anemia, unspecified: Secondary | ICD-10-CM | POA: Insufficient documentation

## 2015-12-20 DIAGNOSIS — Z853 Personal history of malignant neoplasm of breast: Secondary | ICD-10-CM | POA: Diagnosis not present

## 2015-12-20 DIAGNOSIS — N184 Chronic kidney disease, stage 4 (severe): Secondary | ICD-10-CM | POA: Diagnosis not present

## 2015-12-20 DIAGNOSIS — I130 Hypertensive heart and chronic kidney disease with heart failure and stage 1 through stage 4 chronic kidney disease, or unspecified chronic kidney disease: Secondary | ICD-10-CM | POA: Diagnosis not present

## 2015-12-20 DIAGNOSIS — K589 Irritable bowel syndrome without diarrhea: Secondary | ICD-10-CM | POA: Diagnosis not present

## 2015-12-20 DIAGNOSIS — Z888 Allergy status to other drugs, medicaments and biological substances status: Secondary | ICD-10-CM | POA: Insufficient documentation

## 2015-12-20 DIAGNOSIS — M810 Age-related osteoporosis without current pathological fracture: Secondary | ICD-10-CM | POA: Insufficient documentation

## 2015-12-20 DIAGNOSIS — E1129 Type 2 diabetes mellitus with other diabetic kidney complication: Secondary | ICD-10-CM | POA: Diagnosis present

## 2015-12-20 DIAGNOSIS — Z79899 Other long term (current) drug therapy: Secondary | ICD-10-CM | POA: Diagnosis not present

## 2015-12-20 DIAGNOSIS — Z7982 Long term (current) use of aspirin: Secondary | ICD-10-CM | POA: Diagnosis not present

## 2015-12-20 DIAGNOSIS — Z85828 Personal history of other malignant neoplasm of skin: Secondary | ICD-10-CM | POA: Diagnosis not present

## 2015-12-20 DIAGNOSIS — E782 Mixed hyperlipidemia: Secondary | ICD-10-CM | POA: Diagnosis not present

## 2015-12-20 DIAGNOSIS — Z955 Presence of coronary angioplasty implant and graft: Secondary | ICD-10-CM | POA: Insufficient documentation

## 2015-12-20 DIAGNOSIS — N179 Acute kidney failure, unspecified: Secondary | ICD-10-CM

## 2015-12-20 DIAGNOSIS — I700 Atherosclerosis of aorta: Secondary | ICD-10-CM | POA: Diagnosis not present

## 2015-12-20 DIAGNOSIS — Z885 Allergy status to narcotic agent status: Secondary | ICD-10-CM | POA: Diagnosis not present

## 2015-12-20 DIAGNOSIS — E875 Hyperkalemia: Secondary | ICD-10-CM | POA: Diagnosis not present

## 2015-12-20 DIAGNOSIS — N183 Chronic kidney disease, stage 3 (moderate): Secondary | ICD-10-CM | POA: Diagnosis not present

## 2015-12-20 DIAGNOSIS — Z170 Estrogen receptor positive status [ER+]: Secondary | ICD-10-CM | POA: Diagnosis present

## 2015-12-20 DIAGNOSIS — E1122 Type 2 diabetes mellitus with diabetic chronic kidney disease: Secondary | ICD-10-CM | POA: Insufficient documentation

## 2015-12-20 DIAGNOSIS — K219 Gastro-esophageal reflux disease without esophagitis: Secondary | ICD-10-CM | POA: Insufficient documentation

## 2015-12-20 DIAGNOSIS — E1142 Type 2 diabetes mellitus with diabetic polyneuropathy: Secondary | ICD-10-CM | POA: Diagnosis not present

## 2015-12-20 DIAGNOSIS — I440 Atrioventricular block, first degree: Secondary | ICD-10-CM | POA: Diagnosis not present

## 2015-12-20 DIAGNOSIS — I5022 Chronic systolic (congestive) heart failure: Secondary | ICD-10-CM | POA: Diagnosis not present

## 2015-12-20 DIAGNOSIS — E785 Hyperlipidemia, unspecified: Secondary | ICD-10-CM | POA: Diagnosis not present

## 2015-12-20 DIAGNOSIS — Z881 Allergy status to other antibiotic agents status: Secondary | ICD-10-CM | POA: Insufficient documentation

## 2015-12-20 DIAGNOSIS — Z7984 Long term (current) use of oral hypoglycemic drugs: Secondary | ICD-10-CM | POA: Diagnosis not present

## 2015-12-20 DIAGNOSIS — D509 Iron deficiency anemia, unspecified: Secondary | ICD-10-CM | POA: Diagnosis not present

## 2015-12-20 DIAGNOSIS — C50411 Malignant neoplasm of upper-outer quadrant of right female breast: Secondary | ICD-10-CM | POA: Diagnosis present

## 2015-12-20 DIAGNOSIS — N289 Disorder of kidney and ureter, unspecified: Secondary | ICD-10-CM | POA: Diagnosis not present

## 2015-12-20 HISTORY — DX: Hyperkalemia: E87.5

## 2015-12-20 LAB — COMPREHENSIVE METABOLIC PANEL
ALT: 20 U/L (ref 14–54)
AST: 21 U/L (ref 15–41)
Albumin: 4 g/dL (ref 3.5–5.0)
Alkaline Phosphatase: 68 U/L (ref 38–126)
Anion gap: 8 (ref 5–15)
BUN: 77 mg/dL — ABNORMAL HIGH (ref 6–20)
CO2: 16 mmol/L — ABNORMAL LOW (ref 22–32)
Calcium: 9.2 mg/dL (ref 8.9–10.3)
Chloride: 108 mmol/L (ref 101–111)
Creatinine, Ser: 2.34 mg/dL — ABNORMAL HIGH (ref 0.44–1.00)
GFR calc Af Amer: 21 mL/min — ABNORMAL LOW (ref >60)
GFR calc non Af Amer: 18 mL/min — ABNORMAL LOW (ref >60)
Glucose, Bld: 137 mg/dL — ABNORMAL HIGH (ref 65–99)
Potassium: 6.5 mmol/L (ref 3.5–5.1)
Sodium: 132 mmol/L — ABNORMAL LOW (ref 135–145)
Total Bilirubin: 0.4 mg/dL (ref 0.3–1.2)
Total Protein: 6.7 g/dL (ref 6.5–8.1)

## 2015-12-20 LAB — I-STAT CHEM 8, ED
BUN: 83 mg/dL — ABNORMAL HIGH (ref 6–20)
Calcium, Ion: 1.23 mmol/L (ref 1.12–1.23)
Chloride: 114 mmol/L — ABNORMAL HIGH (ref 101–111)
Creatinine, Ser: 2.3 mg/dL — ABNORMAL HIGH (ref 0.44–1.00)
Glucose, Bld: 129 mg/dL — ABNORMAL HIGH (ref 65–99)
HCT: 30 % — ABNORMAL LOW (ref 36.0–46.0)
Hemoglobin: 10.2 g/dL — ABNORMAL LOW (ref 12.0–15.0)
Potassium: 6.6 mmol/L (ref 3.5–5.1)
Sodium: 134 mmol/L — ABNORMAL LOW (ref 135–145)
TCO2: 17 mmol/L (ref 0–100)

## 2015-12-20 LAB — CBC
HCT: 32.1 % — ABNORMAL LOW (ref 36.0–46.0)
Hemoglobin: 10.5 g/dL — ABNORMAL LOW (ref 12.0–15.0)
MCH: 32 pg (ref 26.0–34.0)
MCHC: 32.7 g/dL (ref 30.0–36.0)
MCV: 97.9 fL (ref 78.0–100.0)
Platelets: 131 10*3/uL — ABNORMAL LOW (ref 150–400)
RBC: 3.28 MIL/uL — ABNORMAL LOW (ref 3.87–5.11)
RDW: 14.1 % (ref 11.5–15.5)
WBC: 4.4 10*3/uL (ref 4.0–10.5)

## 2015-12-20 LAB — BASIC METABOLIC PANEL
Anion gap: 5 (ref 5–15)
BUN: 74 mg/dL — ABNORMAL HIGH (ref 6–20)
CO2: 19 mmol/L — ABNORMAL LOW (ref 22–32)
Calcium: 9.6 mg/dL (ref 8.9–10.3)
Chloride: 111 mmol/L (ref 101–111)
Creatinine, Ser: 2.17 mg/dL — ABNORMAL HIGH (ref 0.44–1.00)
GFR calc Af Amer: 23 mL/min — ABNORMAL LOW (ref >60)
GFR calc non Af Amer: 20 mL/min — ABNORMAL LOW (ref >60)
Glucose, Bld: 184 mg/dL — ABNORMAL HIGH (ref 65–99)
Potassium: 6.2 mmol/L — ABNORMAL HIGH (ref 3.5–5.1)
Sodium: 135 mmol/L (ref 135–145)

## 2015-12-20 MED ORDER — INSULIN ASPART 100 UNIT/ML IV SOLN
10.0000 [IU] | Freq: Once | INTRAVENOUS | Status: AC
  Administered 2015-12-20: 10 [IU] via INTRAVENOUS
  Filled 2015-12-20: qty 1

## 2015-12-20 MED ORDER — ALBUTEROL SULFATE (2.5 MG/3ML) 0.083% IN NEBU
10.0000 mg | INHALATION_SOLUTION | Freq: Once | RESPIRATORY_TRACT | Status: DC
  Filled 2015-12-20: qty 12

## 2015-12-20 MED ORDER — DEXTROSE 50 % IV SOLN
1.0000 | Freq: Once | INTRAVENOUS | Status: AC
  Administered 2015-12-20: 50 mL via INTRAVENOUS
  Filled 2015-12-20: qty 50

## 2015-12-20 MED ORDER — SODIUM CHLORIDE 0.9 % IV SOLN
1.0000 g | Freq: Once | INTRAVENOUS | Status: AC
  Administered 2015-12-20: 1 g via INTRAVENOUS
  Filled 2015-12-20: qty 10

## 2015-12-20 MED ORDER — SODIUM CHLORIDE 0.9 % IV BOLUS (SEPSIS)
1000.0000 mL | Freq: Once | INTRAVENOUS | Status: AC
  Administered 2015-12-20: 1000 mL via INTRAVENOUS

## 2015-12-20 MED ORDER — SODIUM POLYSTYRENE SULFONATE 15 GM/60ML PO SUSP
45.0000 g | Freq: Once | ORAL | Status: AC
  Administered 2015-12-20: 45 g via ORAL
  Filled 2015-12-20: qty 180

## 2015-12-20 MED ORDER — ALBUTEROL (5 MG/ML) CONTINUOUS INHALATION SOLN
10.0000 mg/h | INHALATION_SOLUTION | RESPIRATORY_TRACT | Status: DC
  Administered 2015-12-20: 10 mg/h via RESPIRATORY_TRACT

## 2015-12-20 NOTE — H&P (Signed)
History and Physical    Rebecca FRAPPIER [id number] DOB: 1931-12-08 DOA: 12/20/2015  PCP: Mathews Argyle, MD  Patient coming from: Home.  Chief Complaint: Elevated potassium.  HPI: Rebecca Hall is a 82 y.o. female with history of CHF, chronic kidney disease stage IV, diabetes mellitus type II, CAD status post stenting, history of breast cancer, chronic anemia was referred to the ER after patient's routine labs show elevated potassium more than 6. Patient was referred to the ER. In the ER patient's repeat labs show potassium around 6.5. Patient is on potassium replacement for Lasix and also on lisinopril. Patient otherwise denies any chest pain shortness of breath nausea vomiting did have one episode of diarrhea yesterday morning. Patient was given Kayexalate 45 g along with calcium gluconate D50 and insulin. Patient has been admitted for further management of hypokalemia. EKG shows normal sinus rhythm with first-degree AV block QTC of 408 ms QRS of 124 ms.  ED Course: See history of presenting illness.  Review of Systems: As per HPI, rest all negative.   Past Medical History  Diagnosis Date  . Hyperlipidemia   . Hypertension   . Chronic kidney disease   . Osteoporosis   . GERD (gastroesophageal reflux disease)   . IBS (irritable bowel syndrome)   . Arthritis   . Neuromuscular disorder ([place])     peripheral neuropathy feet  . History of lower GI bleeding   . Breast cancer ([place]) 03/15/13    right, 12 o'clock  . Diabetes mellitus without complication [facility])     Past Surgical History  Procedure Laterality Date  . Cataract extraction  2009    both  . Skin cancer excision      head, face, hand ..multiple years  . Abdominal hysterectomy  1975    No salpingo-oophorectomy  . Tonsillectomy    . Colonoscopy    . Breast lumpectomy with needle localization and axillary sentinel lymph node bx Right 04/28/2013    Procedure: BREAST LUMPECTOMY WITH NEEDLE LOCALIZATION AND AXILLARY  SENTINEL LYMPH NODE BX;  Surgeon: Rolm Bookbinder, MD;  Location: [place];  Service: General;  Laterality: Right;  . Cardiac catheterization N/A 01/26/2015    Procedure: Right/Left Heart Cath and Coronary Angiography;  Surgeon: Jolaine Artist, MD;  Location: [place] [facility];  Service: Cardiovascular;  Laterality: N/A;     reports that she has never smoked. She has never used smokeless tobacco. She reports that she does not drink alcohol or use illicit drugs.  Allergies  Allergen Reactions  . Levaquin [Levofloxacin In D5w] Anaphylaxis and Nausea Only    Shaking real bad on medicaton  . Codeine Nausea And Vomiting    Pass out  . Herceptin [Trastuzumab] Rash    Patient has developed extensive body skin rash following each Herceptin treatment of increased presence following each treatment  that cleared with oral steroids  . Sulfa Antibiotics Swelling  . Tape Rash    Blisters     Family History  Problem Relation Age of Onset  . Cancer Maternal Aunt 80    colon cancer  . Cancer Maternal Uncle 65    lung  . Cancer Maternal Grandmother     pancreas  . Cerebral aneurysm Father     Prior to Admission medications   Medication Sig Start Date End Date Taking? Authorizing Provider  allopurinol (ZYLOPRIM) 100 MG tablet Take 100 mg by mouth daily with supper. Take with 300 mg tablet for a total of 400mg   daily 02/05/13  Yes Historical Provider, MD  allopurinol (ZYLOPRIM) 300 MG tablet Take 300 mg by mouth daily with supper. Take with 100 mg tablet for total of 400 mg daily   Yes Historical Provider, MD  anastrozole (ARIMIDEX) 1 MG tablet Take 1 tablet (1 mg total) by mouth daily. Patient taking differently: Take 1 mg by mouth every morning.  06/13/15  Yes Chauncey Cruel, MD  aspirin 81 MG tablet Take 81 mg by mouth at bedtime.    Yes Historical Provider, MD  atorvastatin (LIPITOR) 20 MG tablet Take 20 mg by mouth every morning.  02/23/13  Yes Historical Provider, MD    calcium carbonate (OS-CAL) 600 MG TABS tablet Take 600 mg by mouth 2 (two) times daily with a meal.    Yes Historical Provider, MD  carvedilol (COREG) 3.125 MG tablet Take 1 tablet (3.125 mg total) by mouth 2 (two) times daily with a meal. 03/03/15  Yes Jolaine Artist, MD  denosumab (PROLIA) 60 MG/ML SOLN injection Inject 60 mg into the skin every 6 (six) months. Administer in upper arm, thigh, or abdomen   Yes Historical Provider, MD  ferrous sulfate 325 (65 FE) MG EC tablet Take 325 mg by mouth daily with breakfast.   Yes Historical Provider, MD  furosemide (LASIX) 40 MG tablet Take 40 mg by mouth 2 (two) times daily as needed for fluid. Take 40mg  every morning and an additional 20mg  every evening as needed for swelling   Yes Historical Provider, MD  gabapentin (NEURONTIN) 300 MG capsule TAKE 1 CAPSULE BY MOUTH DAILY AT BEDTIME 07/06/15  Yes Chauncey Cruel, MD  glimepiride (AMARYL) 2 MG tablet Take 2 mg by mouth daily with breakfast.   Yes Historical Provider, MD  lisinopril (PRINIVIL,ZESTRIL) 10 MG tablet Take 1 tablet (10 mg total) by mouth at bedtime. 11/06/15  Yes Jolaine Artist, MD  loratadine (CLARITIN) 10 MG tablet Take 10 mg by mouth every morning.    Yes Historical Provider, MD  Melatonin 5 MG TABS Take 5 mg by mouth at bedtime as needed (As needed for sleep).   Yes Historical Provider, MD  minoxidil (ROGAINE) 2 % external solution Apply 1 application topically 3 (three) times a week.    Yes Historical Provider, MD  Multiple Vitamin (MULTIVITAMIN WITH MINERALS) TABS tablet Take 1 tablet by mouth 2 (two) times daily.    Yes Historical Provider, MD  omeprazole (PRILOSEC) 20 MG capsule Take 20 mg by mouth daily.  03/08/13  Yes Historical Provider, MD  potassium chloride (K-DUR,KLOR-CON) 10 MEQ tablet Take 10 mEq by mouth daily. Take an additional two tablets (20 mEq) on days that evening furosemide is used.   Yes Historical Provider, MD  traMADol (ULTRAM) 50 MG tablet Take 50 mg by  mouth every 6 (six) hours as needed for moderate pain.  09/07/15  Yes Historical Provider, MD  Vitamin D, Ergocalciferol, (DRISDOL) 50000 UNITS CAPS capsule Take 50,000 Units by mouth every 14 (fourteen) days.    Yes Historical Provider, MD  vitamin E 100 UNIT capsule Take 100 Units by mouth daily.   Yes Historical Provider, MD  fenofibrate (TRICOR) 48 MG tablet Take 48 mg by mouth daily. 12/20/15   Historical Provider, MD    Physical Exam: Filed Vitals:   12/20/15 2001 12/20/15 2100 12/20/15 2200 12/20/15 2230  BP:  120/59 112/48 133/61  Pulse:  66 71 70  Temp:      TempSrc:      Resp:  15  16 18  Height:      Weight:      SpO2: 100% 100% 96% 100%      Constitutional: Not in distress. Filed Vitals:   12/20/15 2001 12/20/15 2100 12/20/15 2200 12/20/15 2230  BP:  120/59 112/48 133/61  Pulse:  66 71 70  Temp:      TempSrc:      Resp:  15 16 18   Height:      Weight:      SpO2: 100% 100% 96% 100%   Eyes: Anicteric mild pallor. ENMT: No discharge from the ears eyes nose and mouth. Neck: No JVD appreciated no mass felt. Respiratory: No rhonchi or crepitations. Cardiovascular: S1 and S2 heard. Abdomen: Soft nontender bowel sounds present. Musculoskeletal: No edema. Skin: No rash. Neurologic: Alert awake oriented to time place and person. Moves all extremities. Psychiatric: Appears normal.   Labs on Admission: I have personally reviewed following labs and imaging studies  CBC:  Recent Labs Lab 12/20/15 1904 12/20/15 1918  WBC 4.4  --   HGB 10.5* 10.2*  HCT 32.1* 30.0*  MCV 97.9  --   PLT 131*  --    Basic Metabolic Panel:  Recent Labs Lab 12/20/15 1904 12/20/15 1918 12/20/15 2123  NA 132* 134* 135  K 6.5* 6.6* 6.2*  CL 108 114* 111  CO2 16*  --  19*  GLUCOSE 137* 129* 184*  BUN 77* 83* 74*  CREATININE 2.34* 2.30* 2.17*  CALCIUM 9.2  --  9.6   GFR: Estimated Creatinine Clearance: 18.4 mL/min (by C-G formula based on Cr of 2.17). Liver Function  Tests:  Recent Labs Lab 12/20/15 1904  AST 21  ALT 20  ALKPHOS 68  BILITOT 0.4  PROT 6.7  ALBUMIN 4.0   No results for input(s): LIPASE, AMYLASE in the last 168 hours. No results for input(s): AMMONIA in the last 168 hours. Coagulation Profile: No results for input(s): INR, PROTIME in the last 168 hours. Cardiac Enzymes: No results for input(s): CKTOTAL, CKMB, CKMBINDEX, TROPONINI in the last 168 hours. BNP (last 3 results) No results for input(s): PROBNP in the last 8760 hours. HbA1C: No results for input(s): HGBA1C in the last 72 hours. CBG: No results for input(s): GLUCAP in the last 168 hours. Lipid Profile: No results for input(s): CHOL, HDL, LDLCALC, TRIG, CHOLHDL, LDLDIRECT in the last 72 hours. Thyroid Function Tests: No results for input(s): TSH, T4TOTAL, FREET4, T3FREE, THYROIDAB in the last 72 hours. Anemia Panel: No results for input(s): VITAMINB12, FOLATE, FERRITIN, TIBC, IRON, RETICCTPCT in the last 72 hours. Urine analysis:    Component Value Date/Time   COLORURINE YELLOW 07/01/2015 0323   APPEARANCEUR CLEAR 07/01/2015 0323   LABSPEC 1.010 07/01/2015 0323   PHURINE 5.0 07/01/2015 0323   GLUCOSEU NEGATIVE 07/01/2015 0323   HGBUR NEGATIVE 07/01/2015 0323   BILIRUBINUR NEGATIVE 07/01/2015 0323   KETONESUR NEGATIVE 07/01/2015 0323   PROTEINUR NEGATIVE 07/01/2015 0323   UROBILINOGEN 0.2 01/20/2015 0549   NITRITE NEGATIVE 07/01/2015 0323   LEUKOCYTESUR TRACE* 07/01/2015 0323   Sepsis Labs: @LABRCNTIP (procalcitonin:4,lacticidven:4) )No results found for this or any previous visit (from the past 240 hour(s)).   Radiological Exams on Admission: No results found.  EKG: Independently reviewed. Normal sinus rhythm with first-degree AV block QTC of 408 ms QRS of 124 ms.  Assessment/Plan Principal Problem:   Hyperkalemia Active Problems:   Breast cancer of upper-outer quadrant of right female breast ([place])   DM (diabetes mellitus), type 2 with renal  complications ([place])  Chronic systolic heart failure (H[facility])   CKD (chronic kidney disease) stage 4, GFR 15-29 ml/min (H[facility])    1. Hyperkalemia in the setting of progressive renal failure in a patient taking lisinopril and potassium replacement - will discontinue potassium replacement and lisinopril for now. Patient received Kayexalate in the ER. We will repeat labs in a.m. Will also get nutrition was consulted regarding patient's renal diet. 2. Chronic systolic heart failure last EF measured was in December 2016 was 55-60% - will continue Lasix if creatinine does not worsen. Would hold off any potassium replacement. Will hold off lisinopril secondary to #1. 3. Diabetes mellitus type 2 - patient is on Amaryl. Closely monitor CBGs for any hypoglycemia. 4. CAD status post stenting last year - denies any chest pain continue antiplatelet agents statins. Patient is also on Coreg. 5. History of breast cancer in remission - being followed by oncologist. 6. Chronic anemia - follow CBC. 7. Chronic kidney disease stage IV - see #1.   DVT prophylaxis: Heparin. Code Status: Full code.  Family Communication: No family at the bedside.  Disposition Plan: Home.  Consults called: None.  Admission status: Observation telemetry.    Rise Patience MD [facility] Pager [phone].  If 7PM-7AM, please contact night-coverage [email or web address] Password [facility]  12/20/2015, 11:09 PM

## 2015-12-20 NOTE — ED Provider Notes (Addendum)
CSN: [id number]     Arrival date & time 12/20/15  1836 History   First MD Initiated Contact with Patient 12/20/15 1915     No chief complaint on file.    (Consider location/radiation/quality/duration/timing/severity/associated sxs/prior Treatment) Patient is a 82 y.o. female presenting with general illness. The history is provided by the patient.  Illness Severity:  Mild Onset quality:  Gradual Duration:  2 days Timing:  Constant Progression:  Unchanged Chronicity:  New Associated symptoms: no chest pain, no congestion, no fever, no headaches, no myalgias, no nausea, no rhinorrhea, no shortness of breath, no vomiting and no wheezing     82 yo F With a chief complaint of an elevated potassium level. Patient states she's been mildly fatigued for the past couple days but denies other symptoms. She denies decreased oral intake denies increased Lasix use. She denies abdominal pain diarrhea or vomiting. She was seen in her family doctor's office and found to have a high level and sent here for admission.  Past Medical History  Diagnosis Date  . Hyperlipidemia   . Hypertension   . Chronic kidney disease   . Osteoporosis   . GERD (gastroesophageal reflux disease)   . IBS (irritable bowel syndrome)   . Arthritis   . Neuromuscular disorder ([place])     peripheral neuropathy feet  . History of lower GI bleeding   . Breast cancer ([place]) 03/15/13    right, 12 o'clock  . Diabetes mellitus without complication [facility])    Past Surgical History  Procedure Laterality Date  . Cataract extraction  2009    both  . Skin cancer excision      head, face, hand ..multiple years  . Abdominal hysterectomy  1975    No salpingo-oophorectomy  . Tonsillectomy    . Colonoscopy    . Breast lumpectomy with needle localization and axillary sentinel lymph node bx Right 04/28/2013    Procedure: BREAST LUMPECTOMY WITH NEEDLE LOCALIZATION AND AXILLARY SENTINEL LYMPH NODE BX;  Surgeon: Rolm Bookbinder, MD;   Location: [place];  Service: General;  Laterality: Right;  . Cardiac catheterization N/A 01/26/2015    Procedure: Right/Left Heart Cath and Coronary Angiography;  Surgeon: Jolaine Artist, MD;  Location: [place] [facility];  Service: Cardiovascular;  Laterality: N/A;   Family History  Problem Relation Age of Onset  . Cancer Maternal Aunt 80    colon cancer  . Cancer Maternal Uncle 65    lung  . Cancer Maternal Grandmother     pancreas  . Cerebral aneurysm Father    Social History  Substance Use Topics  . Smoking status: Never Smoker   . Smokeless tobacco: Never Used  . Alcohol Use: No   OB History    No data available     Review of Systems  Constitutional: Negative for fever and chills.  HENT: Negative for congestion and rhinorrhea.   Eyes: Negative for redness and visual disturbance.  Respiratory: Negative for shortness of breath and wheezing.   Cardiovascular: Negative for chest pain and palpitations.  Gastrointestinal: Negative for nausea and vomiting.  Genitourinary: Negative for dysuria and urgency.  Musculoskeletal: Negative for myalgias and arthralgias.  Skin: Negative for pallor and wound.  Neurological: Negative for dizziness and headaches.      Allergies  Levaquin; Codeine; Herceptin; Sulfa antibiotics; and Tape  Home Medications   Prior to Admission medications   Medication Sig Start Date End Date Taking? Authorizing Provider  allopurinol (ZYLOPRIM) 100 MG tablet Take 100  mg by mouth daily with supper. Take with 300 mg tablet for a total of 400mg  daily 02/05/13  Yes Historical Provider, MD  allopurinol (ZYLOPRIM) 300 MG tablet Take 300 mg by mouth daily with supper. Take with 100 mg tablet for total of 400 mg daily   Yes Historical Provider, MD  anastrozole (ARIMIDEX) 1 MG tablet Take 1 tablet (1 mg total) by mouth daily. Patient taking differently: Take 1 mg by mouth every morning.  06/13/15  Yes Chauncey Cruel, MD  aspirin 81 MG  tablet Take 81 mg by mouth at bedtime.    Yes Historical Provider, MD  atorvastatin (LIPITOR) 20 MG tablet Take 20 mg by mouth every morning.  02/23/13  Yes Historical Provider, MD  calcium carbonate (OS-CAL) 600 MG TABS tablet Take 600 mg by mouth 2 (two) times daily with a meal.    Yes Historical Provider, MD  carvedilol (COREG) 3.125 MG tablet Take 1 tablet (3.125 mg total) by mouth 2 (two) times daily with a meal. 03/03/15  Yes Jolaine Artist, MD  denosumab (PROLIA) 60 MG/ML SOLN injection Inject 60 mg into the skin every 6 (six) months. Administer in upper arm, thigh, or abdomen   Yes Historical Provider, MD  ferrous sulfate 325 (65 FE) MG EC tablet Take 325 mg by mouth daily with breakfast.   Yes Historical Provider, MD  furosemide (LASIX) 40 MG tablet Take 40 mg by mouth 2 (two) times daily as needed for fluid. Take 40mg  every morning and an additional 20mg  every evening as needed for swelling   Yes Historical Provider, MD  gabapentin (NEURONTIN) 300 MG capsule TAKE 1 CAPSULE BY MOUTH DAILY AT BEDTIME 07/06/15  Yes Chauncey Cruel, MD  glimepiride (AMARYL) 2 MG tablet Take 2 mg by mouth daily with breakfast.   Yes Historical Provider, MD  lisinopril (PRINIVIL,ZESTRIL) 10 MG tablet Take 1 tablet (10 mg total) by mouth at bedtime. 11/06/15  Yes Jolaine Artist, MD  loratadine (CLARITIN) 10 MG tablet Take 10 mg by mouth every morning.    Yes Historical Provider, MD  Melatonin 5 MG TABS Take 5 mg by mouth at bedtime as needed (As needed for sleep).   Yes Historical Provider, MD  minoxidil (ROGAINE) 2 % external solution Apply 1 application topically 3 (three) times a week.    Yes Historical Provider, MD  Multiple Vitamin (MULTIVITAMIN WITH MINERALS) TABS tablet Take 1 tablet by mouth 2 (two) times daily.    Yes Historical Provider, MD  omeprazole (PRILOSEC) 20 MG capsule Take 20 mg by mouth daily.  03/08/13  Yes Historical Provider, MD  potassium chloride (K-DUR,KLOR-CON) 10 MEQ tablet Take 10  mEq by mouth daily. Take an additional two tablets (20 mEq) on days that evening furosemide is used.   Yes Historical Provider, MD  traMADol (ULTRAM) 50 MG tablet Take 50 mg by mouth every 6 (six) hours as needed for moderate pain.  09/07/15  Yes Historical Provider, MD  Vitamin D, Ergocalciferol, (DRISDOL) 50000 UNITS CAPS capsule Take 50,000 Units by mouth every 14 (fourteen) days.    Yes Historical Provider, MD  vitamin E 100 UNIT capsule Take 100 Units by mouth daily.   Yes Historical Provider, MD  fenofibrate (TRICOR) 48 MG tablet Take 48 mg by mouth daily. 12/20/15   Historical Provider, MD   BP 133/61 mmHg  Pulse 70  Temp(Src) 97.8 F (36.6 C) (Oral)  Resp 18  Ht 5\' 1"  (1.549 m)  Wt 163 lb (73.936 kg)  BMI 30.81 kg/m2  SpO2 100% Physical Exam  Constitutional: She is oriented to person, place, and time. She appears well-developed and well-nourished. No distress.  HENT:  Head: Normocephalic and atraumatic.  Eyes: EOM are normal. Pupils are equal, round, and reactive to light.  Neck: Normal range of motion. Neck supple.  Cardiovascular: Normal rate and regular rhythm.  Exam reveals no gallop and no friction rub.   No murmur heard. Pulmonary/Chest: Effort normal. She has no wheezes. She has no rales.  Abdominal: Soft. She exhibits no distension. There is no tenderness. There is no rebound and no guarding.  Musculoskeletal: She exhibits no edema or tenderness.  Neurological: She is alert and oriented to person, place, and time.  Skin: Skin is warm and dry. She is not diaphoretic.  Psychiatric: She has a normal mood and affect. Her behavior is normal.  Nursing note and vitals reviewed.   ED Course  Procedures (including critical care time) Labs Review Labs Reviewed  CBC - Abnormal; Notable for the following:    RBC 3.28 (*)    Hemoglobin 10.5 (*)    HCT 32.1 (*)    Platelets 131 (*)    All other components within normal limits  COMPREHENSIVE METABOLIC PANEL - Abnormal;  Notable for the following:    Sodium 132 (*)    Potassium 6.5 (*)    CO2 16 (*)    Glucose, Bld 137 (*)    BUN 77 (*)    Creatinine, Ser 2.34 (*)    GFR calc non Af Amer 18 (*)    GFR calc Af Amer 21 (*)    All other components within normal limits  BASIC METABOLIC PANEL - Abnormal; Notable for the following:    Potassium 6.2 (*)    CO2 19 (*)    Glucose, Bld 184 (*)    BUN 74 (*)    Creatinine, Ser 2.17 (*)    GFR calc non Af Amer 20 (*)    GFR calc Af Amer 23 (*)    All other components within normal limits  I-STAT CHEM 8, ED - Abnormal; Notable for the following:    Sodium 134 (*)    Potassium 6.6 (*)    Chloride 114 (*)    BUN 83 (*)    Creatinine, Ser 2.30 (*)    Glucose, Bld 129 (*)    Hemoglobin 10.2 (*)    HCT 30.0 (*)    All other components within normal limits    Imaging Review No results found. I have personally reviewed and evaluated these images and lab results as part of my medical decision-making.   EKG Interpretation   Date/Time:  Wednesday December 20 2015 18:58:22 EDT Ventricular Rate:  68 PR Interval:  230 QRS Duration: 124 QT Interval:  384 QTC Calculation: 408 R Axis:   -57 Text Interpretation:  Sinus rhythm with 1st degree A-V block Left axis  deviation Left ventricular hypertrophy with QRS widening and  repolarization abnormality Cannot rule out Septal infarct , age  undetermined Abnormal ECG No significant change since last tracing  Confirmed by Elmire Amrein MD, Quillian Quince [id number]) on 12/20/2015 7:14:55 PM      MDM   Final diagnoses:  Hyperkalemia  AKI (acute kidney injury) ([place])    82 yo F with a chief complaint of hyperkalemia. No noted EKG changes potassium of 6.6 will give temporizing measures. Discussed with hospitalist request Kayexalate. Will admit.  CRITICAL CARE Performed by: Cecilio Asper   Total critical care  time: 35 minutes  Critical care time was exclusive of separately billable procedures and treating other  patients.  Critical care was necessary to treat or prevent imminent or life-threatening deterioration.  Critical care was time spent personally by me on the following activities: development of treatment plan with patient and/or surrogate as well as nursing, discussions with consultants, evaluation of patient's response to treatment, examination of patient, obtaining history from patient or surrogate, ordering and performing treatments and interventions, ordering and review of laboratory studies, ordering and review of radiographic studies, pulse oximetry and re-evaluation of patient's condition.   The patients results and plan were reviewed and discussed.   Any x-rays performed were independently reviewed by myself.   Differential diagnosis were considered with the presenting HPI.  Medications  albuterol (PROVENTIL,VENTOLIN) solution continuous neb (0 mg/hr Nebulization Stopped 12/20/15 2107)  insulin aspart (novoLOG) injection 10 Units (10 Units Intravenous Given 12/20/15 1955)  dextrose 50 % solution 50 mL (50 mLs Intravenous Given 12/20/15 1956)  calcium gluconate 1 g in sodium chloride 0.9 % 100 mL IVPB (0 g Intravenous Stopped 12/20/15 2107)  sodium chloride 0.9 % bolus 1,000 mL (0 mLs Intravenous Stopped 12/20/15 2141)  sodium polystyrene (KAYEXALATE) 15 GM/60ML suspension 45 g (45 g Oral Given 12/20/15 2140)    Filed Vitals:   12/20/15 2001 12/20/15 2100 12/20/15 2200 12/20/15 2230  BP:  120/59 112/48 133/61  Pulse:  66 71 70  Temp:      TempSrc:      Resp:  15 16 18   Height:      Weight:      SpO2: 100% 100% 96% 100%    Final diagnoses:  Hyperkalemia  AKI (acute kidney injury) ([place])    Admission/ observation were discussed with the admitting physician, patient and/or family and they are comfortable with the plan.      Deno Etienne, DO 12/20/15 [place], DO 12/20/15 2257

## 2015-12-20 NOTE — ED Notes (Signed)
Patient here after being called by MD for high potassium. Patient has hx of same. Denies pain. NAD

## 2015-12-20 NOTE — ED Notes (Signed)
Admitting MD at bedside.

## 2015-12-21 ENCOUNTER — Other Ambulatory Visit: Payer: Self-pay

## 2015-12-21 ENCOUNTER — Encounter (HOSPITAL_COMMUNITY): Payer: Self-pay | Admitting: General Practice

## 2015-12-21 DIAGNOSIS — C50411 Malignant neoplasm of upper-outer quadrant of right female breast: Secondary | ICD-10-CM | POA: Diagnosis not present

## 2015-12-21 DIAGNOSIS — E875 Hyperkalemia: Secondary | ICD-10-CM | POA: Diagnosis not present

## 2015-12-21 DIAGNOSIS — N179 Acute kidney failure, unspecified: Secondary | ICD-10-CM

## 2015-12-21 DIAGNOSIS — I5022 Chronic systolic (congestive) heart failure: Secondary | ICD-10-CM

## 2015-12-21 HISTORY — DX: Hyperkalemia: E87.5

## 2015-12-21 LAB — BASIC METABOLIC PANEL
Anion gap: 7 (ref 5–15)
Anion gap: 4 — ABNORMAL LOW (ref 5–15)
Anion gap: 10 (ref 5–15)
BUN: 61 mg/dL — ABNORMAL HIGH (ref 6–20)
BUN: 52 mg/dL — ABNORMAL HIGH (ref 6–20)
BUN: 47 mg/dL — ABNORMAL HIGH (ref 6–20)
CO2: 18 mmol/L — ABNORMAL LOW (ref 22–32)
CO2: 21 mmol/L — ABNORMAL LOW (ref 22–32)
CO2: 18 mmol/L — ABNORMAL LOW (ref 22–32)
Calcium: 9.1 mg/dL (ref 8.9–10.3)
Calcium: 9.3 mg/dL (ref 8.9–10.3)
Calcium: 9.7 mg/dL (ref 8.9–10.3)
Chloride: 112 mmol/L — ABNORMAL HIGH (ref 101–111)
Chloride: 111 mmol/L (ref 101–111)
Chloride: 109 mmol/L (ref 101–111)
Creatinine, Ser: 1.84 mg/dL — ABNORMAL HIGH (ref 0.44–1.00)
Creatinine, Ser: 1.55 mg/dL — ABNORMAL HIGH (ref 0.44–1.00)
Creatinine, Ser: 1.64 mg/dL — ABNORMAL HIGH (ref 0.44–1.00)
GFR calc Af Amer: 28 mL/min — ABNORMAL LOW (ref >60)
GFR calc Af Amer: 35 mL/min — ABNORMAL LOW (ref >60)
GFR calc Af Amer: 33 mL/min — ABNORMAL LOW (ref >60)
GFR calc non Af Amer: 24 mL/min — ABNORMAL LOW (ref >60)
GFR calc non Af Amer: 30 mL/min — ABNORMAL LOW (ref >60)
GFR calc non Af Amer: 28 mL/min — ABNORMAL LOW (ref >60)
Glucose, Bld: 122 mg/dL — ABNORMAL HIGH (ref 65–99)
Glucose, Bld: 182 mg/dL — ABNORMAL HIGH (ref 65–99)
Glucose, Bld: 224 mg/dL — ABNORMAL HIGH (ref 65–99)
Potassium: 5.7 mmol/L — ABNORMAL HIGH (ref 3.5–5.1)
Potassium: 5.3 mmol/L — ABNORMAL HIGH (ref 3.5–5.1)
Potassium: 4.7 mmol/L (ref 3.5–5.1)
Sodium: 137 mmol/L (ref 135–145)
Sodium: 136 mmol/L (ref 135–145)
Sodium: 137 mmol/L (ref 135–145)

## 2015-12-21 LAB — CBC
HCT: 30.8 % — ABNORMAL LOW (ref 36.0–46.0)
HCT: 29.3 % — ABNORMAL LOW (ref 36.0–46.0)
Hemoglobin: 10.2 g/dL — ABNORMAL LOW (ref 12.0–15.0)
Hemoglobin: 9.7 g/dL — ABNORMAL LOW (ref 12.0–15.0)
MCH: 32.3 pg (ref 26.0–34.0)
MCH: 32.3 pg (ref 26.0–34.0)
MCHC: 33.1 g/dL (ref 30.0–36.0)
MCHC: 33.1 g/dL (ref 30.0–36.0)
MCV: 97.5 fL (ref 78.0–100.0)
MCV: 97.7 fL (ref 78.0–100.0)
Platelets: 121 10*3/uL — ABNORMAL LOW (ref 150–400)
Platelets: 127 10*3/uL — ABNORMAL LOW (ref 150–400)
RBC: 3.16 MIL/uL — ABNORMAL LOW (ref 3.87–5.11)
RBC: 3 MIL/uL — ABNORMAL LOW (ref 3.87–5.11)
RDW: 14.1 % (ref 11.5–15.5)
RDW: 14.2 % (ref 11.5–15.5)
WBC: 4.1 10*3/uL (ref 4.0–10.5)
WBC: 4.3 10*3/uL (ref 4.0–10.5)

## 2015-12-21 LAB — GLUCOSE, CAPILLARY
Glucose-Capillary: 114 mg/dL — ABNORMAL HIGH (ref 65–99)
Glucose-Capillary: 118 mg/dL — ABNORMAL HIGH (ref 65–99)
Glucose-Capillary: 186 mg/dL — ABNORMAL HIGH (ref 65–99)

## 2015-12-21 LAB — CREATININE, SERUM
Creatinine, Ser: 2.05 mg/dL — ABNORMAL HIGH (ref 0.44–1.00)
GFR calc Af Amer: 25 mL/min — ABNORMAL LOW (ref >60)
GFR calc non Af Amer: 21 mL/min — ABNORMAL LOW (ref >60)

## 2015-12-21 MED ORDER — ACETAMINOPHEN 325 MG PO TABS: 650.0000 mg | ORAL_TABLET | Freq: Four times a day (QID) | ORAL | Status: DC | PRN

## 2015-12-21 MED ORDER — ANASTROZOLE 1 MG PO TABS
1.0000 mg | ORAL_TABLET | Freq: Every day | ORAL | Status: DC
  Administered 2015-12-21: 1 mg via ORAL
  Filled 2015-12-21: qty 1

## 2015-12-21 MED ORDER — ACETAMINOPHEN 650 MG RE SUPP: 650.0000 mg | Freq: Four times a day (QID) | RECTAL | Status: DC | PRN

## 2015-12-21 MED ORDER — ALLOPURINOL 100 MG PO TABS
100.0000 mg | ORAL_TABLET | Freq: Every day | ORAL | Status: DC
  Administered 2015-12-21: 100 mg via ORAL
  Filled 2015-12-21: qty 1

## 2015-12-21 MED ORDER — TRAMADOL HCL 50 MG PO TABS: 50.0000 mg | ORAL_TABLET | Freq: Four times a day (QID) | ORAL | Status: DC | PRN

## 2015-12-21 MED ORDER — ASPIRIN EC 81 MG PO TBEC
81.0000 mg | DELAYED_RELEASE_TABLET | Freq: Every day | ORAL | Status: DC
  Administered 2015-12-21: 81 mg via ORAL
  Filled 2015-12-21: qty 1

## 2015-12-21 MED ORDER — ONDANSETRON HCL 4 MG/2ML IJ SOLN: 4.0000 mg | Freq: Four times a day (QID) | INTRAMUSCULAR | Status: DC | PRN

## 2015-12-21 MED ORDER — VITAMIN D (ERGOCALCIFEROL) 1.25 MG (50000 UNIT) PO CAPS: 50000.0000 [IU] | ORAL_CAPSULE | ORAL | Status: DC

## 2015-12-21 MED ORDER — ADULT MULTIVITAMIN W/MINERALS CH
1.0000 | ORAL_TABLET | Freq: Two times a day (BID) | ORAL | Status: DC
  Administered 2015-12-21: 1 via ORAL
  Filled 2015-12-21: qty 1

## 2015-12-21 MED ORDER — FENOFIBRATE 54 MG PO TABS
54.0000 mg | ORAL_TABLET | Freq: Every day | ORAL | Status: DC
Start: 2015-12-21 — End: 2015-12-21
  Administered 2015-12-21: 54 mg via ORAL
  Filled 2015-12-21: qty 1

## 2015-12-21 MED ORDER — FERROUS SULFATE 325 (65 FE) MG PO TABS
325.0000 mg | ORAL_TABLET | Freq: Every day | ORAL | Status: DC
  Administered 2015-12-21: 325 mg via ORAL
  Filled 2015-12-21: qty 1

## 2015-12-21 MED ORDER — ATORVASTATIN CALCIUM 20 MG PO TABS
20.0000 mg | ORAL_TABLET | Freq: Every morning | ORAL | Status: DC
  Administered 2015-12-21: 20 mg via ORAL
  Filled 2015-12-21: qty 1

## 2015-12-21 MED ORDER — ALLOPURINOL 300 MG PO TABS
300.0000 mg | ORAL_TABLET | Freq: Every day | ORAL | Status: DC
  Administered 2015-12-21: 300 mg via ORAL
  Filled 2015-12-21: qty 1

## 2015-12-21 MED ORDER — PANTOPRAZOLE SODIUM 40 MG PO TBEC
40.0000 mg | DELAYED_RELEASE_TABLET | Freq: Every day | ORAL | Status: DC
  Administered 2015-12-21: 40 mg via ORAL
  Filled 2015-12-21: qty 1

## 2015-12-21 MED ORDER — FUROSEMIDE 40 MG PO TABS
40.0000 mg | ORAL_TABLET | Freq: Every day | ORAL | Status: DC
  Administered 2015-12-21: 40 mg via ORAL
  Filled 2015-12-21: qty 1

## 2015-12-21 MED ORDER — MELATONIN 3 MG PO TABS
3.0000 mg | ORAL_TABLET | Freq: Every evening | ORAL | Status: DC | PRN
  Filled 2015-12-21: qty 1

## 2015-12-21 MED ORDER — GLIMEPIRIDE 2 MG PO TABS
2.0000 mg | ORAL_TABLET | Freq: Every day | ORAL | Status: DC
  Administered 2015-12-21: 2 mg via ORAL
  Filled 2015-12-21: qty 1

## 2015-12-21 MED ORDER — CARVEDILOL 3.125 MG PO TABS
3.1250 mg | ORAL_TABLET | Freq: Two times a day (BID) | ORAL | Status: DC
  Administered 2015-12-21 (×3): 3.125 mg via ORAL
  Filled 2015-12-21 (×3): qty 1

## 2015-12-21 MED ORDER — HEPARIN SODIUM (PORCINE) 5000 UNIT/ML IJ SOLN
5000.0000 [IU] | Freq: Three times a day (TID) | INTRAMUSCULAR | Status: DC
  Administered 2015-12-21 (×3): 5000 [IU] via SUBCUTANEOUS
  Filled 2015-12-21 (×3): qty 1

## 2015-12-21 MED ORDER — INSULIN ASPART 100 UNIT/ML ~~LOC~~ SOLN
0.0000 [IU] | Freq: Three times a day (TID) | SUBCUTANEOUS | Status: DC
  Administered 2015-12-21: 2 [IU] via SUBCUTANEOUS

## 2015-12-21 MED ORDER — GABAPENTIN 300 MG PO CAPS
300.0000 mg | ORAL_CAPSULE | Freq: Every day | ORAL | Status: DC
  Administered 2015-12-21: 300 mg via ORAL
  Filled 2015-12-21: qty 1

## 2015-12-21 MED ORDER — MINOXIDIL 2 % EX SOLN
1.0000 | CUTANEOUS | Status: DC
Start: 2015-12-22 — End: 2015-12-21

## 2015-12-21 MED ORDER — ONDANSETRON HCL 4 MG PO TABS: 4.0000 mg | ORAL_TABLET | Freq: Four times a day (QID) | ORAL | Status: DC | PRN

## 2015-12-21 MED ORDER — VITAMIN E 45 MG (100 UNIT) PO CAPS
100.0000 [IU] | ORAL_CAPSULE | Freq: Every day | ORAL | Status: DC
  Administered 2015-12-21: 100 [IU] via ORAL
  Filled 2015-12-21: qty 1

## 2015-12-21 MED ORDER — CALCIUM CARBONATE 1250 (500 CA) MG PO TABS
1.0000 | ORAL_TABLET | Freq: Two times a day (BID) | ORAL | Status: DC
  Administered 2015-12-21: 500 mg via ORAL
  Filled 2015-12-21 (×2): qty 1

## 2015-12-21 MED ORDER — FUROSEMIDE 40 MG PO TABS: 40.0000 mg | ORAL_TABLET | Freq: Two times a day (BID) | ORAL | Status: DC | PRN

## 2015-12-21 MED ORDER — LORATADINE 10 MG PO TABS
10.0000 mg | ORAL_TABLET | Freq: Every day | ORAL | Status: DC
  Administered 2015-12-21: 10 mg via ORAL
  Filled 2015-12-21: qty 1

## 2015-12-21 NOTE — Progress Notes (Signed)
Patient is discharge to home accompanied by patient's spouse and NT via wheelchair. Discharge instructions given . Patient verbalizes understanding. All personal belongings given. Telemetry box and IV removed prior to discharge and site in good condition.  

## 2015-12-21 NOTE — Care Management Obs Status (Signed)
[place] NOTIFICATION   Patient Details  Name: JAVANNA SHALLCROSS MRN: [id number] Date of Birth: [birth year]   Medicare Observation Status Notification Given:  Yes    Royston Bake, RN 12/21/2015, 1:11 PM

## 2015-12-21 NOTE — Discharge Summary (Signed)
Physician Discharge Summary  Rebecca Hall [id number] DOB: 1931-09-30 DOA: 12/20/2015  PCP: Mathews Argyle, MD  Admit date: 12/20/2015 Discharge date: 12/21/2015  Time spent: 45 minutes  Recommendations for Outpatient Follow-up:  1. Amy CLegg NP in 1 week and labs at FU   Discharge Diagnoses:  Principal Problem:   Recurrent Hyperkalemia Active Problems:   Breast cancer of upper-outer quadrant of right female breast ([place])   DM (diabetes mellitus), type 2 with renal complications (HCC)   Chronic systolic heart failure (HCC)   CKD (chronic kidney disease) stage 4, GFR 15-29 ml/min (HCC)   Discharge Condition: stable  Diet recommendation: low sodium, diabetic  Filed Weights   12/20/15 1859 12/20/15 2323  Weight: 73.936 kg (163 lb) 74.889 kg (165 lb 1.6 oz)    History of present illness:  Rebecca Hall is a 82 y.o. female with history of CHF, chronic kidney disease stage IV, diabetes mellitus type II, CAD status post stenting, history of breast cancer, chronic anemia was referred to the ER after patient's routine labs show elevated potassium more than 6. Patient was referred to the ER. In the ER patient's repeat labs show potassium around 6.5. Patient is on potassium replacement for Lasix and also on lisinopril. Patient otherwise denied any chest pain shortness of breath   Hospital Course:  1. Hyperkalemia in the setting of CKD 3, taking lisinopril and potassium replacement -improved with Kayexalate and stopping potassium replacement and lisinopril -also consulted Dietician and educated on Renal diet. -also d/w CHF NP Amy Clegg regarding second admission with Hyperkalemia and need to indefinitely stop ACE and KCl. -CHF team will call pt with FU tomorrow and repeat labs in 1 week  2. Chronic systolic heart failure last EF measured was in December 2016 was 55-60%  - continued Po Lasix, compensated and weight at baseline per pt report  3. Diabetes mellitus type 2 - patient  is on Amaryl. -this was continued, no hypoglycemia noted during admission  4. CAD status post stenting last year - denies any chest pain continue antiplatelet agents statins and Coreg.  5. History of breast cancer in remission - being followed by oncologist.  6. Chronic anemia - follow CBC.  7. Chronic kidney disease stage 3 -baseline creatinine 1.8 -2range, was 2.3 on admission and improved to 1.64 at discharge, back on home dose of Po lasix  Discharge Exam: Filed Vitals:   12/21/15 0625 12/21/15 1152  BP: 135/79 136/55  Pulse: 75 80  Temp: 97.8 F (36.6 C) 97.9 F (36.6 C)  Resp: 18 18    General: AAOx3 Cardiovascular: S1S2/RRR Respiratory: CTAB  Discharge Instructions   Discharge Instructions    Diet - low sodium heart healthy    Complete by:  As directed      Increase activity slowly    Complete by:  As directed           Current Discharge Medication List    CONTINUE these medications which have NOT CHANGED   Details  allopurinol (ZYLOPRIM) 100 MG tablet Take 100 mg by mouth daily with supper. Take with 300 mg tablet for a total of 400mg  daily    anastrozole (ARIMIDEX) 1 MG tablet Take 1 tablet (1 mg total) by mouth daily. Qty: 90 tablet, Refills: 3    aspirin 81 MG tablet Take 81 mg by mouth at bedtime.     atorvastatin (LIPITOR) 20 MG tablet Take 20 mg by mouth every morning.     calcium carbonate (OS-CAL) 600  MG TABS tablet Take 600 mg by mouth 2 (two) times daily with a meal.     carvedilol (COREG) 3.125 MG tablet Take 1 tablet (3.125 mg total) by mouth 2 (two) times daily with a meal. Qty: 180 tablet, Refills: 3   Associated Diagnoses: Chronic systolic heart failure (HCC)    denosumab (PROLIA) 60 MG/ML SOLN injection Inject 60 mg into the skin every 6 (six) months. Administer in upper arm, thigh, or abdomen    ferrous sulfate 325 (65 FE) MG EC tablet Take 325 mg by mouth daily with breakfast.    furosemide (LASIX) 40 MG tablet Take 40 mg by mouth  2 (two) times daily as needed for fluid. Take 40mg  every morning and an additional 20mg  every evening as needed for swelling    gabapentin (NEURONTIN) 300 MG capsule TAKE 1 CAPSULE BY MOUTH DAILY AT BEDTIME Qty: 90 capsule, Refills: 1    glimepiride (AMARYL) 2 MG tablet Take 2 mg by mouth daily with breakfast.    loratadine (CLARITIN) 10 MG tablet Take 10 mg by mouth every morning.     Melatonin 5 MG TABS Take 5 mg by mouth at bedtime as needed (As needed for sleep).    minoxidil (ROGAINE) 2 % external solution Apply 1 application topically 3 (three) times a week.     Multiple Vitamin (MULTIVITAMIN WITH MINERALS) TABS tablet Take 1 tablet by mouth 2 (two) times daily.     omeprazole (PRILOSEC) 20 MG capsule Take 20 mg by mouth daily.     traMADol (ULTRAM) 50 MG tablet Take 50 mg by mouth every 6 (six) hours as needed for moderate pain.     Vitamin D, Ergocalciferol, (DRISDOL) 50000 UNITS CAPS capsule Take 50,000 Units by mouth every 14 (fourteen) days.     vitamin E 100 UNIT capsule Take 100 Units by mouth daily.    fenofibrate (TRICOR) 48 MG tablet Take 48 mg by mouth daily.      STOP taking these medications     lisinopril (PRINIVIL,ZESTRIL) 10 MG tablet      potassium chloride (K-DUR,KLOR-CON) 10 MEQ tablet        Allergies  Allergen Reactions  . Levaquin [Levofloxacin In D5w] Anaphylaxis and Nausea Only    Shaking real bad on medicaton  . Codeine Nausea And Vomiting    Pass out  . Herceptin [Trastuzumab] Rash    Patient has developed extensive body skin rash following each Herceptin treatment of increased presence following each treatment  that cleared with oral steroids  . Sulfa Antibiotics Swelling  . Tape Rash    Blisters    Follow-up Information    Follow up with Darrick Grinder, NP In 1 week.   Specialty:  Cardiology   Why:  Office will call tomorrow with appt and lab work in a week   Contact information:   [address] [place] [address] [phone]         The results of significant diagnostics from this hospitalization (including imaging, microbiology, ancillary and laboratory) are listed below for reference.    Significant Diagnostic Studies: No results found.  Microbiology: No results found for this or any previous visit (from the past 240 hour(s)).   Labs: Basic Metabolic Panel:  Recent Labs Lab 12/20/15 1904 12/20/15 1918 12/20/15 2123 12/21/15 0045 12/21/15 0348 12/21/15 1108 12/21/15 1417  NA 132* 134* 135  --  137 136 137  K 6.5* 6.6* 6.2*  --  5.7* 5.3* 4.7  CL 108 114*  111  --  112* 111 109  CO2 16*  --  19*  --  18* 21* 18*  GLUCOSE 137* 129* 184*  --  122* 182* 224*  BUN 77* 83* 74*  --  61* 52* 47*  CREATININE 2.34* 2.30* 2.17* 2.05* 1.84* 1.55* 1.64*  CALCIUM 9.2  --  9.6  --  9.1 9.3 9.7   Liver Function Tests:  Recent Labs Lab 12/20/15 1904  AST 21  ALT 20  ALKPHOS 68  BILITOT 0.4  PROT 6.7  ALBUMIN 4.0   No results for input(s): LIPASE, AMYLASE in the last 168 hours. No results for input(s): AMMONIA in the last 168 hours. CBC:  Recent Labs Lab 12/20/15 1904 12/20/15 1918 12/21/15 0045 12/21/15 0348  WBC 4.4  --  4.1 4.3  HGB 10.5* 10.2* 10.2* 9.7*  HCT 32.1* 30.0* 30.8* 29.3*  MCV 97.9  --  97.5 97.7  PLT 131*  --  121* 127*   Cardiac Enzymes: No results for input(s): CKTOTAL, CKMB, CKMBINDEX, TROPONINI in the last 168 hours. BNP: BNP (last 3 results)  Recent Labs  02/09/15 1515 07/14/15 1142  BNP 29.9 81.0    ProBNP (last 3 results) No results for input(s): PROBNP in the last 8760 hours.  CBG:  Recent Labs Lab 12/21/15 0031 12/21/15 0622 12/21/15 1113  GLUCAP 118* 114* 186*       SignedDomenic Polite MD.  [facility] 12/21/2015, 3:33 PM

## 2015-12-21 NOTE — Consult Note (Signed)
   [facility] Inpatient Consult   12/21/2015  Rebecca Hall 21-Nov-1931 [id number]  Patient was assessed for [facility] for community services. Patient was previously active with [facility] by the Telephonic Nurse. Patient is a 82 y.o. female with history of CHF, chronic kidney disease stage IV, diabetes mellitus type II, CAD status post stenting, history of breast cancer, chronic anemia  Admitted for observation with a potassium level around 6.5. Patient was on potassium replacement for Lasix and also on lisinopril.  . Patient has been admitted for further management of hypokalemia. EKG shows normal sinus rhythm with first-degree AV block QTC of 408 ms QRS of 124 ms. Met with patient at bedside regarding being restarted with [facility] services. Consent form signed and folder with [facility] information given.  Patient states she has received information regarding symptoms but will accept follow up after this hospitalization.  States, "This is my 3rd trip here in a year and I don't want this pattern to continue and they are changing some of my medicines, I think.."  Of note, [facility] services does not replace or interfere with any services that are arranged by inpatient case management or social work. For additional questions or referrals please contact:  Natividad Brood, RN BSN [facility] Liaison  [phone] business mobile phone Toll free office [phone]

## 2015-12-21 NOTE — Progress Notes (Signed)
Nutrition Education Note  RD consulted for Renal Education. Provided My Food Pyramid Guide for Kidney Disease to patient. Reviewed food groups and provided written recommended serving sizes specifically determined for patient's current nutritional status. List of high potassium and high sodium foods to avoid was provided and reviewed.   Strongly encouraged compliance of this diet.   Encouraged pt to discuss specific diet questions/concerns with RD at HD outpatient facility. Teach back method used. Pt reports eating a lot of bananas and potatoes PTA which she states she can cut back on.   Expect very good compliance.  Body mass index is 31.21 kg/(m^2). Pt meets criteria for Obesity based on current BMI.  Current diet order is Renal/Carb Modified, patient is consuming approximately 50-75% of meals at this time. Labs and medications reviewed. No further nutrition interventions warranted at this time. RD contact information provided. If additional nutrition issues arise, please re-consult RD.  Scarlette Ar RD, LDN Inpatient Clinical Dietitian Pager: [phone] After Hours Pager: [phone]

## 2015-12-22 ENCOUNTER — Other Ambulatory Visit: Payer: Self-pay | Admitting: *Deleted

## 2015-12-22 NOTE — Patient Outreach (Addendum)
Referral received from hospital liaison.  Member was previously involved with [facility] telephonic case manager, closed late April.  She was admitted to hospital for observation for hyperkalemia and congestive heart failure.    Call placed to member, she state she is not feeling well today, stating she is experiencing some nausea.  She denies feeling like that prior to discharge yesterday.  This care manager inquired about her blood sugar (she has history of diabetes) and blood pressure.  She state that she does not monitor her own blood sugar, but report her blood pressure being 158/75, heart rate 77.  She does report some lightheadedness.  Member advised to contact PCP office immediately to report symptoms and request a same day appointment.  She report that her PCP office is closed on Fridays.  Advised to contact the Heart and Vascular clinic (she has follow up appointment already scheduled with them) to report symptoms and obtain further instructions.  Also advised to seek emergency attention if her symptoms worsen.  She verbalizes understanding and state she will contact them immediately.    She reports taking all medications as prescribed, unable to review list as she ends call to contact physician.  Will follow up next week.  Valente David, [place], MSN [place] [phone]

## 2015-12-25 ENCOUNTER — Other Ambulatory Visit: Payer: Self-pay | Admitting: *Deleted

## 2015-12-25 DIAGNOSIS — N189 Chronic kidney disease, unspecified: Secondary | ICD-10-CM

## 2015-12-25 DIAGNOSIS — I5041 Acute combined systolic (congestive) and diastolic (congestive) heart failure: Secondary | ICD-10-CM

## 2015-12-25 NOTE — Patient Outreach (Signed)
Call placed to member to follow up on initial call where she expressed concerns about nausea.  She state that she has a "very good pharmacist" and spoke to her about over the counter nausea medications and was advised against it, but was instead advised to drink ginger ale and eat ginger cookies.  She reports her nausea being better.  She denies any concerns at this time, and state that she was provided with diet instructions upon discharge.  She was advised to follow a renal diet in effort to control her potassium levels.  She state that she has the contact information for the dietician and will contact with questions.  Member state that she was diagnosed a few months ago with heart failure and admit that she need more education on the diagnosis and management.  She agrees to a referral to the health coach for disease management.  Will place referral and close to community.  Valente David, [place], MSN [place] [phone]

## 2015-12-27 NOTE — Addendum Note (Signed)
Addended byValente David on: 12/27/2015 12:15 PM   Modules accepted: Orders

## 2015-12-29 ENCOUNTER — Ambulatory Visit (HOSPITAL_COMMUNITY)
Admit: 2015-12-29 | Discharge: 2015-12-29 | Disposition: A | Discharge status: Home / Self Care | Payer: PPO | Source: Ambulatory Visit | Attending: Internal Medicine | Admitting: Internal Medicine

## 2015-12-29 ENCOUNTER — Telehealth (HOSPITAL_COMMUNITY): Payer: Self-pay | Admitting: Cardiology

## 2015-12-29 DIAGNOSIS — I5022 Chronic systolic (congestive) heart failure: Secondary | ICD-10-CM | POA: Diagnosis not present

## 2015-12-29 LAB — BASIC METABOLIC PANEL
Anion gap: 9 (ref 5–15)
BUN: 30 mg/dL — ABNORMAL HIGH (ref 6–20)
CO2: 21 mmol/L — ABNORMAL LOW (ref 22–32)
Calcium: 8.2 mg/dL — ABNORMAL LOW (ref 8.9–10.3)
Chloride: 106 mmol/L (ref 101–111)
Creatinine, Ser: 1.52 mg/dL — ABNORMAL HIGH (ref 0.44–1.00)
GFR calc Af Amer: 36 mL/min — ABNORMAL LOW (ref >60)
GFR calc non Af Amer: 31 mL/min — ABNORMAL LOW (ref >60)
Glucose, Bld: 183 mg/dL — ABNORMAL HIGH (ref 65–99)
Potassium: 3.3 mmol/L — ABNORMAL LOW (ref 3.5–5.1)
Sodium: 136 mmol/L (ref 135–145)

## 2015-12-29 MED ORDER — POTASSIUM CHLORIDE CRYS ER 10 MEQ PO TBCR: 20.0000 meq | EXTENDED_RELEASE_TABLET | Freq: Every day | ORAL | Status: DC

## 2015-12-29 NOTE — Telephone Encounter (Signed)
Patient aware and voiced understanding Reports she has a lot of tabs at home, will restart Patient requested to have potassium supplemented through diet only (bananas and potatoes etc) Advised that I am unable to quantify how many of these foods she would need to eat daily to get the correct amount back for supplementation Advised to restart k dur tab and further discuss at ov 7/10

## 2015-12-29 NOTE — Telephone Encounter (Signed)
-----   Message from Conrad Davenport, NP sent at 12/29/2015 12:02 PM EDT ----- Add 20 meq K uur daily

## 2016-01-01 ENCOUNTER — Encounter (HOSPITAL_COMMUNITY): Payer: Self-pay

## 2016-01-01 ENCOUNTER — Other Ambulatory Visit: Payer: Self-pay | Admitting: *Deleted

## 2016-01-01 ENCOUNTER — Encounter: Payer: Self-pay | Admitting: *Deleted

## 2016-01-01 ENCOUNTER — Ambulatory Visit (HOSPITAL_COMMUNITY)
Admission: RE | Admit: 2016-01-01 | Discharge: 2016-01-01 | Disposition: A | Discharge status: Home / Self Care | Payer: PPO | Source: Ambulatory Visit | Attending: Cardiology | Admitting: Cardiology

## 2016-01-01 VITALS — BP 128/68 | HR 69 | Wt 159.0 lb

## 2016-01-01 DIAGNOSIS — E875 Hyperkalemia: Secondary | ICD-10-CM | POA: Diagnosis not present

## 2016-01-01 DIAGNOSIS — Z853 Personal history of malignant neoplasm of breast: Secondary | ICD-10-CM | POA: Diagnosis not present

## 2016-01-01 DIAGNOSIS — Z8719 Personal history of other diseases of the digestive system: Secondary | ICD-10-CM | POA: Insufficient documentation

## 2016-01-01 DIAGNOSIS — Z7982 Long term (current) use of aspirin: Secondary | ICD-10-CM | POA: Diagnosis not present

## 2016-01-01 DIAGNOSIS — K589 Irritable bowel syndrome without diarrhea: Secondary | ICD-10-CM | POA: Diagnosis not present

## 2016-01-01 DIAGNOSIS — Z801 Family history of malignant neoplasm of trachea, bronchus and lung: Secondary | ICD-10-CM | POA: Insufficient documentation

## 2016-01-01 DIAGNOSIS — R0683 Snoring: Secondary | ICD-10-CM | POA: Diagnosis not present

## 2016-01-01 DIAGNOSIS — M7989 Other specified soft tissue disorders: Secondary | ICD-10-CM | POA: Insufficient documentation

## 2016-01-01 DIAGNOSIS — E1122 Type 2 diabetes mellitus with diabetic chronic kidney disease: Secondary | ICD-10-CM | POA: Insufficient documentation

## 2016-01-01 DIAGNOSIS — G629 Polyneuropathy, unspecified: Secondary | ICD-10-CM | POA: Insufficient documentation

## 2016-01-01 DIAGNOSIS — Z800 Family history of malignant neoplasm of digestive organs: Secondary | ICD-10-CM | POA: Insufficient documentation

## 2016-01-01 DIAGNOSIS — I429 Cardiomyopathy, unspecified: Secondary | ICD-10-CM | POA: Diagnosis not present

## 2016-01-01 DIAGNOSIS — E876 Hypokalemia: Secondary | ICD-10-CM | POA: Diagnosis not present

## 2016-01-01 DIAGNOSIS — N183 Chronic kidney disease, stage 3 unspecified: Secondary | ICD-10-CM

## 2016-01-01 DIAGNOSIS — K219 Gastro-esophageal reflux disease without esophagitis: Secondary | ICD-10-CM | POA: Insufficient documentation

## 2016-01-01 DIAGNOSIS — E785 Hyperlipidemia, unspecified: Secondary | ICD-10-CM | POA: Insufficient documentation

## 2016-01-01 DIAGNOSIS — I2510 Atherosclerotic heart disease of native coronary artery without angina pectoris: Secondary | ICD-10-CM | POA: Diagnosis not present

## 2016-01-01 DIAGNOSIS — I130 Hypertensive heart and chronic kidney disease with heart failure and stage 1 through stage 4 chronic kidney disease, or unspecified chronic kidney disease: Secondary | ICD-10-CM | POA: Insufficient documentation

## 2016-01-01 DIAGNOSIS — M1990 Unspecified osteoarthritis, unspecified site: Secondary | ICD-10-CM | POA: Insufficient documentation

## 2016-01-01 DIAGNOSIS — I5022 Chronic systolic (congestive) heart failure: Secondary | ICD-10-CM | POA: Diagnosis not present

## 2016-01-01 DIAGNOSIS — Z79899 Other long term (current) drug therapy: Secondary | ICD-10-CM | POA: Insufficient documentation

## 2016-01-01 DIAGNOSIS — I5032 Chronic diastolic (congestive) heart failure: Secondary | ICD-10-CM | POA: Diagnosis not present

## 2016-01-01 DIAGNOSIS — M810 Age-related osteoporosis without current pathological fracture: Secondary | ICD-10-CM | POA: Diagnosis not present

## 2016-01-01 DIAGNOSIS — Z8249 Family history of ischemic heart disease and other diseases of the circulatory system: Secondary | ICD-10-CM | POA: Diagnosis not present

## 2016-01-01 LAB — BASIC METABOLIC PANEL
Anion gap: 10 (ref 5–15)
BUN: 15 mg/dL (ref 6–20)
CO2: 20 mmol/L — ABNORMAL LOW (ref 22–32)
Calcium: 7.6 mg/dL — ABNORMAL LOW (ref 8.9–10.3)
Chloride: 108 mmol/L (ref 101–111)
Creatinine, Ser: 1.33 mg/dL — ABNORMAL HIGH (ref 0.44–1.00)
GFR calc Af Amer: 42 mL/min — ABNORMAL LOW (ref >60)
GFR calc non Af Amer: 36 mL/min — ABNORMAL LOW (ref >60)
Glucose, Bld: 229 mg/dL — ABNORMAL HIGH (ref 65–99)
Potassium: 3.7 mmol/L (ref 3.5–5.1)
Sodium: 138 mmol/L (ref 135–145)

## 2016-01-01 NOTE — Progress Notes (Signed)
Advanced Heart Failure Clinic Note   Patient ID: Rebecca Hall, female   DOB: June 23, 1932, 82 y.o.   MRN: [id number] PCP: Dr Felipa Eth.  Primary HF: Dr Haroldine Laws Oncologist: Dr Jana Hakim  HPI: Rebecca Hall is an 82 y.o. female with history of Breast Cancer treated with 3 doses only of herceptin in 2014, CKD, HTN, HLD, Osteoporosis, GERD, Hx of GI Bleed, and DM.  Admitted to [facility] 8/16 with hypovolemic shock, AKI creatinine 4.7. After fluid resuscitation developed pulmonary edema. Found to have new onset LV dysfunction. Had cath as noted below with 1v CAD with 60% proximal LAD stenosis and NICM. Discharged on lasix, carvedilol, and Entresto. Discharge weight was 156 pounds.   Admitted 06/30/15 with weakness and dizziness and found to have AKI, hyperkalemia, and hypotension. (Creatinine 3.69 and K 7.4 on admit) Pt had stopped spiro several days prior with dizziness. K treated in ED as well as inpatient setting. Received IV fluid for hypotension.    She returns HF follow up. Overall feeling ok. She was taken off lisinopril and KCl due to hyperkalemia but was restarted on low dose KCl with subsequent low K.  No exertional dyspnea though not very active (limited by arthritis).  She has occasional sharp, nonexertional left-sided chest pain that lasts a few seconds then resolves, no trigger.   [facility] 01/27/2015  RA = 11 RV = 42/8/15 PA = 38/8 (24) PCW = 19 Fick cardiac output/index = 5.2/3.0 Thermo CO/CI = 5.7/3.2 PVR = 1.0 WU FA sat = 93% PA sat = 51%, 51% Ao Pressure: 133/60 (85) LV Pressure: 133/9/17 No aortic stenosis  Assessment: 1) Moderate 1v CAD with 60% proximal LAD lesion 2) Non-ischemic CM with EF 30-35% by echo 3) Mildly elevated R-sided pressures with normal cardiac output  Labs:   06/09/15 K 2.7, Creatinine 1.4 07/14/2015: K 4.2 Creatinine 2.35  07/31/2015: K 4.1 Creatinine 1.34  7/17: K 3.3, creatinine 1.52  ROS: All systems negative except as listed in HPI, PMH and Problem  List.  SH:  Social History   Social History  . Marital Status: Married    Spouse Name: N/A  . Number of Children: N/A  . Years of Education: N/A   Occupational History  . Not on file.   Social History Main Topics  . Smoking status: Never Smoker   . Smokeless tobacco: Never Used  . Alcohol Use: No  . Drug Use: No  . Sexual Activity: Not on file     Comment: first live birth age 36, P 2, Estrogen    Other Topics Concern  . Not on file   Social History Narrative    FH:  Family History  Problem Relation Age of Onset  . Cancer Maternal Aunt 80    colon cancer  . Cancer Maternal Uncle 65    lung  . Cancer Maternal Grandmother     pancreas  . Cerebral aneurysm Father     Past Medical History  Diagnosis Date  . Hyperlipidemia   . Hypertension   . Chronic kidney disease   . Osteoporosis   . GERD (gastroesophageal reflux disease)   . IBS (irritable bowel syndrome)   . Arthritis   . Neuromuscular disorder ([place])     peripheral neuropathy feet  . History of lower GI bleeding   . Breast cancer ([place]) 03/15/13    right, 12 o'clock  . Diabetes mellitus without complication ([place])   . Hyperkalemia 12/21/2015    Current Outpatient Prescriptions  Medication Sig Dispense  Refill  . allopurinol (ZYLOPRIM) 100 MG tablet Take 100 mg by mouth daily with supper. Take with 300 mg tablet for a total of 400mg  daily    . anastrozole (ARIMIDEX) 1 MG tablet Take 1 tablet (1 mg total) by mouth daily. 90 tablet 3  . aspirin 81 MG tablet Take 81 mg by mouth at bedtime.     Marland Kitchen atorvastatin (LIPITOR) 20 MG tablet Take 20 mg by mouth every morning.     . calcium carbonate (OS-CAL) 600 MG TABS tablet Take 600 mg by mouth 2 (two) times daily with a meal.     . carvedilol (COREG) 3.125 MG tablet Take 1 tablet (3.125 mg total) by mouth 2 (two) times daily with a meal. 180 tablet 3  . denosumab (PROLIA) 60 MG/ML SOLN injection Inject 60 mg into the skin every 6 (six) months. Administer in upper  arm, thigh, or abdomen    . fenofibrate (TRICOR) 48 MG tablet Take 48 mg by mouth daily.    . ferrous sulfate 325 (65 FE) MG EC tablet Take 325 mg by mouth daily with breakfast.    . furosemide (LASIX) 40 MG tablet Take 40 mg by mouth. Take 40mg  every morning and an additional 20mg  every evening as needed for swelling    . gabapentin (NEURONTIN) 300 MG capsule TAKE 1 CAPSULE BY MOUTH DAILY AT BEDTIME 90 capsule 1  . glimepiride (AMARYL) 2 MG tablet Take 2 mg by mouth daily with breakfast.    . loratadine (CLARITIN) 10 MG tablet Take 10 mg by mouth every morning.     . Melatonin 5 MG TABS Take 5 mg by mouth at bedtime as needed (As needed for sleep).    . minoxidil (ROGAINE) 2 % external solution Apply 1 application topically 3 (three) times a week.     . Multiple Vitamin (MULTIVITAMIN WITH MINERALS) TABS tablet Take 1 tablet by mouth 2 (two) times daily.     Marland Kitchen omeprazole (PRILOSEC) 20 MG capsule Take 20 mg by mouth daily.     . potassium chloride SA (K-DUR,KLOR-CON) 10 MEQ tablet Take 2 tablets (20 mEq total) by mouth daily. 60 tablet 3  . traMADol (ULTRAM) 50 MG tablet Take 50 mg by mouth every 6 (six) hours as needed for moderate pain.     . Vitamin D, Ergocalciferol, (DRISDOL) 50000 UNITS CAPS capsule Take 50,000 Units by mouth every 14 (fourteen) days.     . vitamin E 100 UNIT capsule Take 100 Units by mouth daily.     No current facility-administered medications for this encounter.    Filed Vitals:   01/01/16 0935  BP: 128/68  Pulse: 69  Weight: 159 lb (72.122 kg)  SpO2: 98%    Wt Readings from Last 3 Encounters:  01/01/16 155 lb (70.308 kg)  01/01/16 159 lb (72.122 kg)  12/20/15 165 lb 1.6 oz (74.889 kg)    PHYSICAL EXAM:  General:  Well appearing. NAD. Husband present. Ambulated in the clinic without difficulty.  HEENT: normal Neck: supple. JVP 6-7. Carotids 2+ bilaterally; no bruits. No thyromegaly or lymphadenopathy noted. Cor: PMI normal. RRR. No M/G/R Lungs: Clear,  no resp distress Abdomen: soft, non-tender, non-distended, no HSM. No bruits or masses. +BS  Extremities: no cyanosis, clubbing, rash. R and LLE trace edema.  Neuro: alert & orientedx3, cranial nerves grossly intact. Moves all 4 extremities w/o difficulty. Affect pleasant.   ASSESSMENT & PLAN: 1. Chronic diastolic CHF: She has history of nonischemic cardiomyopathy, but most recent  echo in 12/16 showed recovery of LV systolic function, EF 0000000.  NYHA II, volume status stable on exam. - - Continue lasix 40 mg daily with extra 20 mg as needed for lower extremity edema.  - Continue 20 meq potassium daily. She had hyperkalemia on lisinopril but then developed hypokalemia off lisinopril and KCl.  Low dose KCl restarted.  Check BMET today.   - Continue carvedilol 3.125 mg twice a day.  - Recurrent hyperkalemia on lisinopril, now stopped.  Would not re-challenge given improvement in LV systolic function.  2. CKD: Stage 3.  Check BMET today.  3. Hx of Breast Cancer: Only received 3 doses of Herceptin in 2014. Stopped due to possible allergy. 4. DM2 5. HTN: Stable BP. Continue current regimen.  6. HLD: Continue lipitor 20 mg daily 7. Heavy snoring: She declines sleep study.   8. CAD: Nonobstructive on prior cath. No CP. Continue lipitor 20 mg and ASA 81 mg daily.   Followup in 6 months.   Loralie Champagne 01/01/2016   Follow up 4-6 months with Dr Benedetto Goad NP-C  10:57 PM

## 2016-01-01 NOTE — Patient Outreach (Addendum)
[place] [facility]) Care Management  [place]  01/01/2016   Rebecca Hall [birth year] [id number]  Subjective: RN Health Coach telephone call to patient.  Hipaa compliance verified. Patient had just gotten back from the Heart clinic. Per patient she weighed 155 pounds. Patient stated she has no swelling.no shortness of breath. She is in the green zone. Patient is a little flustered with the repeated phone checks. RN Health Coach explained her role and that she would call once a month unless patient needed something. RN explained that she would be sending some educational material. Patient agreed to follow up outreach calls.  Objective:   Encounter Medications:  Outpatient Encounter Prescriptions as of 01/01/2016  Medication Sig Note  . allopurinol (ZYLOPRIM) 100 MG tablet Take 100 mg by mouth daily with supper. Take with 300 mg tablet for a total of 400mg  daily   . anastrozole (ARIMIDEX) 1 MG tablet Take 1 tablet (1 mg total) by mouth daily.   Marland Kitchen aspirin 81 MG tablet Take 81 mg by mouth at bedtime.    Marland Kitchen atorvastatin (LIPITOR) 20 MG tablet Take 20 mg by mouth every morning.    . calcium carbonate (OS-CAL) 600 MG TABS tablet Take 600 mg by mouth 2 (two) times daily with a meal.    . carvedilol (COREG) 3.125 MG tablet Take 1 tablet (3.125 mg total) by mouth 2 (two) times daily with a meal.   . denosumab (PROLIA) 60 MG/ML SOLN injection Inject 60 mg into the skin every 6 (six) months. Administer in upper arm, thigh, or abdomen   . fenofibrate (TRICOR) 48 MG tablet Take 48 mg by mouth daily.   . ferrous sulfate 325 (65 FE) MG EC tablet Take 325 mg by mouth daily with breakfast.   . furosemide (LASIX) 40 MG tablet Take 40 mg by mouth. Take 40mg  every morning and an additional 20mg  every evening as needed for swelling   . gabapentin (NEURONTIN) 300 MG capsule TAKE 1 CAPSULE BY MOUTH DAILY AT BEDTIME   . glimepiride (AMARYL) 2 MG tablet Take 2 mg by mouth daily with breakfast.   .  loratadine (CLARITIN) 10 MG tablet Take 10 mg by mouth every morning.    . Melatonin 5 MG TABS Take 5 mg by mouth at bedtime as needed (As needed for sleep).   . minoxidil (ROGAINE) 2 % external solution Apply 1 application topically 3 (three) times a week.    . Multiple Vitamin (MULTIVITAMIN WITH MINERALS) TABS tablet Take 1 tablet by mouth 2 (two) times daily.    Marland Kitchen omeprazole (PRILOSEC) 20 MG capsule Take 20 mg by mouth daily.    . potassium chloride SA (K-DUR,KLOR-CON) 10 MEQ tablet Take 2 tablets (20 mEq total) by mouth daily.   . traMADol (ULTRAM) 50 MG tablet Take 50 mg by mouth every 6 (six) hours as needed for moderate pain.    . Vitamin D, Ergocalciferol, (DRISDOL) 50000 UNITS CAPS capsule Take 50,000 Units by mouth every 14 (fourteen) days.  12/20/2015: Every other Monday  . vitamin E 100 UNIT capsule Take 100 Units by mouth daily.    No facility-administered encounter medications on file as of 01/01/2016.    Functional Status:  In your present state of health, do you have any difficulty performing the following activities: 01/01/2016 12/21/2015  Hearing? N N  Vision? N N  Difficulty concentrating or making decisions? N N  Walking or climbing stairs? N N  Dressing or bathing? - N  Doing errands,  shopping? N N  Preparing Food and eating ? N -  Using the Toilet? N -  In the past six months, have you accidently leaked urine? - -  Do you have problems with loss of bowel control? N -  Managing your Medications? N -  Managing your Finances? N -  Housekeeping or managing your Housekeeping? Y -    Fall/Depression Screening: PHQ 2/9 Scores 01/01/2016 03/29/2015  PHQ - 2 Score 0 0   THN CM Care Plan Problem One        Most Recent Value   Care Plan Problem One  Knowledge deficit in self management of Congestive Heart Failure   Role Documenting the Problem One  [place] for Problem One  Active   THN Long Term Goal (31-90 days)  Patient will not have any readmissions to  congestive Heart Failure within the next 90 days   THN Long Term Goal Start Date  01/01/16   Interventions for Problem One [place] reminded the patient ot keep appointments with PCP and Cardiologist. RN eminded patient the importance ofd taking medications as prescribed. RN will monitor patient monthly telephonic   THN CM Short Term Goal #1 (0-30 days)  Patient will be able to verbalize eating low sodium foods and following her diet.within the next 30 days   THN CM Short Term Goal #1 Start Date  01/01/16   Interventions for Short Term Goal #1  RN sent to patient foods low in sodium. RN send patient a low salt chart. RN send patient [name] diet. RN will follow up within a month for discussion and teach back   THN CM Short Term Goal #2 (0-30 days)  Patient will be able to verbalize what zone she is in and what action plan was needed within the next 30 days   Interventions for Short Term Goal #2  RN sent patient a zone sheet on congestive heart failure. Patient will verbalize what zones she has been in and the action plan that was needed. RN will follow up within a month for discussion and teach back.       Assessment:  Patient will benefit from Health Coach telephonic outreach for education and support for diabetes self management.  Plan:  RN sent educational material on low sodium diet RN sent educational material on Dash Diet RN sent educational picture chart on low sodium RN sent zone sheet for CHF RN will follow up within 30 days for outreach discussion and teach back  [place] [facility] [phone]

## 2016-01-01 NOTE — Patient Instructions (Signed)
Lab today  We will contact you in 6 months to schedule your next appointment with Dr Haroldine Laws

## 2016-01-02 ENCOUNTER — Other Ambulatory Visit: Payer: Self-pay | Admitting: Oncology

## 2016-01-02 DIAGNOSIS — C50411 Malignant neoplasm of upper-outer quadrant of right female breast: Secondary | ICD-10-CM

## 2016-01-08 ENCOUNTER — Other Ambulatory Visit: Payer: Self-pay | Admitting: Oncology

## 2016-01-08 NOTE — Telephone Encounter (Signed)
Chart reviewed.

## 2016-01-30 ENCOUNTER — Other Ambulatory Visit: Payer: Self-pay | Admitting: *Deleted

## 2016-01-30 ENCOUNTER — Encounter: Payer: Self-pay | Admitting: *Deleted

## 2016-01-30 NOTE — Patient Outreach (Signed)
[place] [facility]) Care Management  01/30/2016   KAREEM AUL Feb 14, 1932 [id number]  Subjective: RN Health Coach telephone call to patient.  Hipaa compliance verified. Per patient her weight is 156 pounds today. Patient is in the green zone. Patient is very upset and stressed. Per patient she talked with a dietician in the hospital and was  Told about foods to avoid with elevated potassium. Per patient she had her blood drawn and was told her potassium was low and ok to eat foods with potassium in them. Patient stated she has stage IV kidney failure and a diabetic. Patient stated, " I don't know what to eat. I don't know if I am eating the right thing. I am so confused. RN discussed with patient about  foods that are high and low in potassium and looking at a renal and diabetic diet. Patient is willing to go to nutritional classes. Patient has agreed to follow up outreach calls.   Objective:   Current Medications:  Current Outpatient Prescriptions  Medication Sig Dispense Refill  . allopurinol (ZYLOPRIM) 100 MG tablet Take 100 mg by mouth daily with supper. Take with 300 mg tablet for a total of 426m daily    . anastrozole (ARIMIDEX) 1 MG tablet TAKE 1 TABLET BY MOUTH DAILY. 90 tablet 1  . aspirin 81 MG tablet Take 81 mg by mouth at bedtime.     .Marland Kitchenatorvastatin (LIPITOR) 20 MG tablet Take 20 mg by mouth every morning.     . calcium carbonate (OS-CAL) 600 MG TABS tablet Take 600 mg by mouth 2 (two) times daily with a meal.     . carvedilol (COREG) 3.125 MG tablet Take 1 tablet (3.125 mg total) by mouth 2 (two) times daily with a meal. 180 tablet 3  . denosumab (PROLIA) 60 MG/ML SOLN injection Inject 60 mg into the skin every 6 (six) months. Administer in upper arm, thigh, or abdomen    . fenofibrate (TRICOR) 48 MG tablet Take 48 mg by mouth daily.    . ferrous sulfate 325 (65 FE) MG EC tablet Take 325 mg by mouth daily with breakfast.    . furosemide (LASIX) 40 MG tablet Take 40 mg  by mouth. Take 435mevery morning and an additional 2088mvery evening as needed for swelling    . gabapentin (NEURONTIN) 300 MG capsule TAKE 1 CAPSULE BY MOUTH AT BEDTIME 90 capsule 1  . glimepiride (AMARYL) 2 MG tablet Take 2 mg by mouth daily with breakfast.    . loratadine (CLARITIN) 10 MG tablet Take 10 mg by mouth every morning.     . Melatonin 5 MG TABS Take 5 mg by mouth at bedtime as needed (As needed for sleep).    . minoxidil (ROGAINE) 2 % external solution Apply 1 application topically 3 (three) times a week.     . Multiple Vitamin (MULTIVITAMIN WITH MINERALS) TABS tablet Take 1 tablet by mouth 2 (two) times daily.     . oMarland Kitcheneprazole (PRILOSEC) 20 MG capsule Take 20 mg by mouth daily.     . potassium chloride SA (K-DUR,KLOR-CON) 10 MEQ tablet Take 2 tablets (20 mEq total) by mouth daily. 60 tablet 3  . traMADol (ULTRAM) 50 MG tablet Take 50 mg by mouth every 6 (six) hours as needed for moderate pain.     . Vitamin D, Ergocalciferol, (DRISDOL) 50000 UNITS CAPS capsule Take 50,000 Units by mouth every 14 (fourteen) days.     . vitamin E 100 UNIT capsule  Take 100 Units by mouth daily.     No current facility-administered medications for this visit.     Functional Status:  In your present state of health, do you have any difficulty performing the following activities: 01/30/2016 01/01/2016  Hearing? N N  Vision? N N  Difficulty concentrating or making decisions? N N  Walking or climbing stairs? N N  Dressing or bathing? N -  Doing errands, shopping? N N  Preparing Food and eating ? N N  Using the Toilet? N N  In the past six months, have you accidently leaked urine? N -  Do you have problems with loss of bowel control? N N  Managing your Medications? N N  Managing your Finances? N N  Housekeeping or managing your Housekeeping? [name]  Some recent data might be hidden    Fall/Depression Screening: PHQ 2/9 Scores 01/30/2016 01/01/2016 03/29/2015  PHQ - 2 Score 0 0 0   THN CM Care Plan  Problem One   Flowsheet Row Most Recent Value  Care Plan Problem One  Knowledge deficit in self management of Congestive Heart Failure  Role Documenting the Problem One  [place] for Problem One  Active  THN Long Term Goal (31-90 days)  Patient will not have any readmissions to congestive Heart Failure within the next 90 days  Interventions for Problem One [place] reminded the patient ot keep appointments with PCP and Cardiologist. RN eminded patient the importance ofd taking medications as prescribed. RN will monitor patient monthly telephonic  THN CM Short Term Goal #1 (0-30 days)  Patient will be able to verbalize eating low sodium foods and following her diet.within the next 30 days  THN CM Short Term Goal #1 Start Date  01/30/16  Interventions for Short Term Goal #1  RN sent to patient foods low in sodium. RN send patient a low salt chart. RN send patient [name] diet. RN  discussed with patient hyper and hypokalemia and renal diet. RN will call physician and have a nutritional consult ordered.  THN CM Short Term Goal #2 (0-30 days)  Patient will be able to verbalize what zone she is in and what action plan was needed within the next 30 days  THN CM Short Term Goal #2 Start Date  01/03/16  [facility] CM Short Term Goal #2 Met Date  01/30/16  Interventions for Short Term Goal #2  RN sent patient a zone sheet on congestive heart failure. Patient will verbalize what zones she has been in and the action plan that was needed. RN will follow up within a month for discussion and teach back.      Assessment:  Patient would benefit from nutritional classes Patient will continue to benefit from Health Coach telephonic outreach for education and support for diabetes self management. Plan:  RN sent educational material on Renal diet RN sent educational material on Diabetic diet RN sent educational material on Hyperkalemia RN sent educational material on  Hypokalemia RN  sent a carbohydrate picture chart RN sent a picture chart on foods high and low in sodium RN called physician office and asked for a referral to a nutritionist RN will follow up in the month of September  [place] [facility] [phone]

## 2016-02-21 DIAGNOSIS — E782 Mixed hyperlipidemia: Secondary | ICD-10-CM | POA: Diagnosis not present

## 2016-02-21 DIAGNOSIS — Z79899 Other long term (current) drug therapy: Secondary | ICD-10-CM | POA: Diagnosis not present

## 2016-02-27 ENCOUNTER — Other Ambulatory Visit (HOSPITAL_COMMUNITY): Payer: Self-pay | Admitting: Internal Medicine

## 2016-02-27 DIAGNOSIS — I5022 Chronic systolic (congestive) heart failure: Secondary | ICD-10-CM

## 2016-03-06 ENCOUNTER — Encounter: Payer: Self-pay | Admitting: *Deleted

## 2016-03-06 ENCOUNTER — Other Ambulatory Visit: Payer: Self-pay | Admitting: *Deleted

## 2016-03-06 NOTE — Patient Outreach (Signed)
[place] [facility]) Care Management  03/06/2016   Rebecca Hall 11-Oct-1931 [id number]  Subjective: RN Health Coach telephone call to patient.  Hipaa compliance verified. Per patient she is in the green zone. Patient is weighing daily and recording with today being 159 pounds.. Patient is checking her blood pressure daily and documenting today 134/71.Patient stated she has read the information sent over and over. Patient excited about the information. Patient A1C is 8.4. Patient was not sure what that meant. Per patient she was not sure what the triglyceride meant and the cholesterol results. Patient has agreed to follow up outreach call.  Objective:   Current Medications:  Current Outpatient Prescriptions  Medication Sig Dispense Refill  . allopurinol (ZYLOPRIM) 100 MG tablet Take 100 mg by mouth daily with supper. Take with 300 mg tablet for a total of 457m daily    . anastrozole (ARIMIDEX) 1 MG tablet TAKE 1 TABLET BY MOUTH DAILY. 90 tablet 1  . aspirin 81 MG tablet Take 81 mg by mouth at bedtime.     .Marland Kitchenatorvastatin (LIPITOR) 20 MG tablet Take 20 mg by mouth every morning.     . calcium carbonate (OS-CAL) 600 MG TABS tablet Take 600 mg by mouth 2 (two) times daily with a meal.     . carvedilol (COREG) 3.125 MG tablet TAKE 1 TABLET (3.125 MG TOTAL) BY MOUTH 2 (TWO) TIMES DAILY WITH A MEAL. 180 tablet 3  . denosumab (PROLIA) 60 MG/ML SOLN injection Inject 60 mg into the skin every 6 (six) months. Administer in upper arm, thigh, or abdomen    . fenofibrate (TRICOR) 48 MG tablet Take 48 mg by mouth daily.    . ferrous sulfate 325 (65 FE) MG EC tablet Take 325 mg by mouth daily with breakfast.    . furosemide (LASIX) 40 MG tablet Take 40 mg by mouth. Take 426mevery morning and an additional 2038mvery evening as needed for swelling    . gabapentin (NEURONTIN) 300 MG capsule TAKE 1 CAPSULE BY MOUTH AT BEDTIME 90 capsule 1  . glimepiride (AMARYL) 2 MG tablet Take 2 mg by mouth daily  with breakfast.    . loratadine (CLARITIN) 10 MG tablet Take 10 mg by mouth every morning.     . Melatonin 5 MG TABS Take 5 mg by mouth at bedtime as needed (As needed for sleep).    . minoxidil (ROGAINE) 2 % external solution Apply 1 application topically 3 (three) times a week.     . Multiple Vitamin (MULTIVITAMIN WITH MINERALS) TABS tablet Take 1 tablet by mouth 2 (two) times daily.     . oMarland Kitcheneprazole (PRILOSEC) 20 MG capsule Take 20 mg by mouth daily.     . potassium chloride SA (K-DUR,KLOR-CON) 10 MEQ tablet Take 2 tablets (20 mEq total) by mouth daily. 60 tablet 3  . traMADol (ULTRAM) 50 MG tablet Take 50 mg by mouth every 6 (six) hours as needed for moderate pain.     . Vitamin D, Ergocalciferol, (DRISDOL) 50000 UNITS CAPS capsule Take 50,000 Units by mouth every 14 (fourteen) days.     . vitamin E 100 UNIT capsule Take 100 Units by mouth daily.     No current facility-administered medications for this visit.     Functional Status:  In your present state of health, do you have any difficulty performing the following activities: 03/06/2016 01/30/2016  Hearing? N N  Vision? N N  Difficulty concentrating or making decisions? N N  Walking  or climbing stairs? N N  Dressing or bathing? N N  Doing errands, shopping? N N  Preparing Food and eating ? N N  Using the Toilet? N N  In the past six months, have you accidently leaked urine? N N  Do you have problems with loss of bowel control? N N  Managing your Medications? N N  Managing your Finances? N N  Housekeeping or managing your Housekeeping? [name]  Some recent data might be hidden    Fall/Depression Screening: PHQ 2/9 Scores 03/06/2016 01/30/2016 01/01/2016 03/29/2015  PHQ - 2 Score 0 0 0 0   THN CM Care Plan Problem One   Flowsheet Row Most Recent Value  Care Plan Problem One  Knowledge deficit in self management of Congestive Heart Failure  Role Documenting the Problem One  [place] for Problem One  Active  THN Long  Term Goal (31-90 days)  Patient will not have any readmissions to congestive Heart Failure within the next 90 days  Interventions for Problem One [place] reminded the patient ot keep appointments with PCP and Cardiologist. RN eminded patient the importance ofd taking medications as prescribed. RN will monitor patient monthly telephonic  THN CM Short Term Goal #1 (0-30 days)  Patient will be able to verbalize eating low sodium foods and following her diet.within the next 30 days  THN CM Short Term Goal #1 Met Date  03/06/16  THN CM Short Term Goal #2 (0-30 days)  Patient will be able to verbalize what zone she is in and what action plan was needed within the next 30 days  THN CM Short Term Goal #3 (0-30 days)  Patient will be able to verbalize understanding what cholesterol results mean within the next 30 days  THN CM Short Term Goal #3 Start Date  03/06/16  Interventions for Short Tern Goal #3  RN sent patient EMMI educational material on cholesterol and understanding cholesterol results. RN sent patient educational material on fat and cholesterol restricted diet. RN will follow up with discussion and teach back       Assessment:  Patient did not understand what triglycerides meant Patient did not understand the results given to her regarding her lipid panel Patient does not know what A1C means Patient is weighing and documenting every day. Patient is taking her blood pressure every day Per patient she is reading all material Per patient she is taking all medications as prescribed Patient will continue to benefit from [place] telephonic outreach for education and support for congestive heart failure self management.   Plan:  RN sent educational material on what Cholesterol means RN sent EMMI educational material on understanding cholesterol results RN sent educational material on foods to lower your cholesterol RN sent educational material on What the A1C  means RN will follow up within the month of October  Rebecca Hall [place] [facility] Management [phone]

## 2016-03-12 DIAGNOSIS — Z85828 Personal history of other malignant neoplasm of skin: Secondary | ICD-10-CM | POA: Diagnosis not present

## 2016-03-12 DIAGNOSIS — L2089 Other atopic dermatitis: Secondary | ICD-10-CM | POA: Diagnosis not present

## 2016-03-12 DIAGNOSIS — Z808 Family history of malignant neoplasm of other organs or systems: Secondary | ICD-10-CM | POA: Diagnosis not present

## 2016-03-12 DIAGNOSIS — L821 Other seborrheic keratosis: Secondary | ICD-10-CM | POA: Diagnosis not present

## 2016-03-12 DIAGNOSIS — D485 Neoplasm of uncertain behavior of skin: Secondary | ICD-10-CM | POA: Diagnosis not present

## 2016-03-12 DIAGNOSIS — Z23 Encounter for immunization: Secondary | ICD-10-CM | POA: Diagnosis not present

## 2016-03-12 DIAGNOSIS — C44712 Basal cell carcinoma of skin of right lower limb, including hip: Secondary | ICD-10-CM | POA: Diagnosis not present

## 2016-03-21 DIAGNOSIS — J0100 Acute maxillary sinusitis, unspecified: Secondary | ICD-10-CM | POA: Diagnosis not present

## 2016-03-21 DIAGNOSIS — R05 Cough: Secondary | ICD-10-CM | POA: Diagnosis not present

## 2016-03-29 ENCOUNTER — Other Ambulatory Visit: Payer: Self-pay | Admitting: Oncology

## 2016-03-29 DIAGNOSIS — Z853 Personal history of malignant neoplasm of breast: Secondary | ICD-10-CM

## 2016-04-03 ENCOUNTER — Ambulatory Visit: Payer: Self-pay | Admitting: *Deleted

## 2016-04-09 ENCOUNTER — Telehealth: Payer: Self-pay | Admitting: *Deleted

## 2016-04-09 NOTE — Telephone Encounter (Signed)
FYI "I'm scheduled to see Dr. Jana Hakim this week.  When I come in I need all lab results.  K+, Creat to keep myself out the hospital.  I also want a flu vaccine when I'm there."   Noted in appointment notes.

## 2016-04-11 ENCOUNTER — Ambulatory Visit (HOSPITAL_BASED_OUTPATIENT_CLINIC_OR_DEPARTMENT_OTHER): Payer: PPO | Admitting: Oncology

## 2016-04-11 ENCOUNTER — Ambulatory Visit (HOSPITAL_BASED_OUTPATIENT_CLINIC_OR_DEPARTMENT_OTHER): Payer: PPO

## 2016-04-11 ENCOUNTER — Other Ambulatory Visit: Payer: Self-pay | Admitting: *Deleted

## 2016-04-11 ENCOUNTER — Other Ambulatory Visit (HOSPITAL_BASED_OUTPATIENT_CLINIC_OR_DEPARTMENT_OTHER): Payer: PPO

## 2016-04-11 VITALS — BP 133/74 | HR 67 | Temp 97.7°F | Resp 18 | Ht 61.0 in | Wt 161.8 lb

## 2016-04-11 DIAGNOSIS — C50411 Malignant neoplasm of upper-outer quadrant of right female breast: Secondary | ICD-10-CM

## 2016-04-11 DIAGNOSIS — M8580 Other specified disorders of bone density and structure, unspecified site: Secondary | ICD-10-CM

## 2016-04-11 DIAGNOSIS — M8589 Other specified disorders of bone density and structure, multiple sites: Secondary | ICD-10-CM

## 2016-04-11 DIAGNOSIS — Z79811 Long term (current) use of aromatase inhibitors: Secondary | ICD-10-CM

## 2016-04-11 DIAGNOSIS — M818 Other osteoporosis without current pathological fracture: Secondary | ICD-10-CM

## 2016-04-11 DIAGNOSIS — Z23 Encounter for immunization: Secondary | ICD-10-CM

## 2016-04-11 DIAGNOSIS — Z170 Estrogen receptor positive status [ER+]: Secondary | ICD-10-CM

## 2016-04-11 LAB — CBC WITH DIFFERENTIAL/PLATELET
BASO%: 0.3 % (ref 0.0–2.0)
Basophils Absolute: 0 10*3/uL (ref 0.0–0.1)
EOS%: 3 % (ref 0.0–7.0)
Eosinophils Absolute: 0.1 10*3/uL (ref 0.0–0.5)
HCT: 33.7 % — ABNORMAL LOW (ref 34.8–46.6)
HGB: 11.3 g/dL — ABNORMAL LOW (ref 11.6–15.9)
LYMPH%: 38.6 % (ref 14.0–49.7)
MCH: 33.3 pg (ref 25.1–34.0)
MCHC: 33.5 g/dL (ref 31.5–36.0)
MCV: 99.4 fL (ref 79.5–101.0)
MONO#: 0.4 10*3/uL (ref 0.1–0.9)
MONO%: 9.7 % (ref 0.0–14.0)
NEUT#: 1.8 10*3/uL (ref 1.5–6.5)
NEUT%: 48.4 % (ref 38.4–76.8)
Platelets: 157 10*3/uL (ref 145–400)
RBC: 3.39 10*6/uL — ABNORMAL LOW (ref 3.70–5.45)
RDW: 13.6 % (ref 11.2–14.5)
WBC: 3.7 10*3/uL — ABNORMAL LOW (ref 3.9–10.3)
lymph#: 1.4 10*3/uL (ref 0.9–3.3)

## 2016-04-11 LAB — COMPREHENSIVE METABOLIC PANEL
ALT: 46 U/L (ref 0–55)
AST: 34 U/L (ref 5–34)
Albumin: 3.9 g/dL (ref 3.5–5.0)
Alkaline Phosphatase: 56 U/L (ref 40–150)
Anion Gap: 14 mEq/L — ABNORMAL HIGH (ref 3–11)
BUN: 24.5 mg/dL (ref 7.0–26.0)
CO2: 26 mEq/L (ref 22–29)
Calcium: 9.6 mg/dL (ref 8.4–10.4)
Chloride: 103 mEq/L (ref 98–109)
Creatinine: 1.7 mg/dL — ABNORMAL HIGH (ref 0.6–1.1)
EGFR: 28 mL/min/{1.73_m2} — ABNORMAL LOW (ref >90)
Glucose: 155 mg/dl — ABNORMAL HIGH (ref 70–140)
Potassium: 3.8 mEq/L (ref 3.5–5.1)
Sodium: 142 mEq/L (ref 136–145)
Total Bilirubin: 0.41 mg/dL (ref 0.20–1.20)
Total Protein: 7.3 g/dL (ref 6.4–8.3)

## 2016-04-11 MED ORDER — DENOSUMAB 60 MG/ML ~~LOC~~ SOLN: 60.0000 mg | Freq: Once | SUBCUTANEOUS | 0 refills | Status: AC

## 2016-04-11 MED ORDER — INFLUENZA VAC SPLIT QUAD 0.5 ML IM SUSY
0.5000 mL | PREFILLED_SYRINGE | Freq: Once | INTRAMUSCULAR | Status: AC
  Administered 2016-04-11: 0.5 mL via INTRAMUSCULAR
  Filled 2016-04-11: qty 0.5

## 2016-04-11 MED ORDER — DENOSUMAB 60 MG/ML ~~LOC~~ SOLN
60.0000 mg | Freq: Once | SUBCUTANEOUS | Status: AC
  Administered 2016-04-11: 60 mg via SUBCUTANEOUS
  Filled 2016-04-11: qty 1

## 2016-04-11 NOTE — Progress Notes (Signed)
ID: [name] OB: 1931/08/19  MR#: [id number]  XBJ#:[id number]  PCP: Mathews Argyle, MD GYN:   SU: Rolm Bookbinder OTHER MD: Floyde Parkins, Earle Gell, Daniel Bensimhon  CHIEF COMPLAINT:  Estrogen receptor positive Breast Cancer  CURRENT TREATMENT: Anastrozole   HISTORY OF PRESENT ILLNESS: From the original intake note:  Rebecca Hall had routine screening mammography at [facility] 03/04/2013 showing a possible mass in the right breast, measuring 4 mm in diameter. Additional views 03/10/2013 confirmed an area of asymmetry in the right breast which was hypoechoic by ultrasonography. Biopsy of this area 03/15/2013 showed ([id number]) and invasive ductal carcinoma, grade 1 or 2, estrogen receptor 100% positive, progesterone receptor 100% positive, with a proliferation marker of 30% and HER-2 amplification by CISH with a ratio of 3.51 and an average HER-2 copy number of 6.85.  On 04/05/2013 the patient underwent bilateral breast MRIs. This showed in the right breast an 8 mm mass at the site of the known biopsy site, with surrounding known masslike enhancement. The entire area of concern measured 1.9 cm. There were no abnormal appearing lymph nodes of concern. In the left breast there were 2 areas of clumped non-masslike enhancement measuring 0.5 cm and 2.4 cm.  The patient's subsequent history is as detailed below  INTERVAL HISTORY: Cozette returns today for follow-up of her breast cancer accompanied by her husband. She continues on anastrozole, with good tolerance. She has some hot flashes as her only significant side effect. She obtains it at a good price.  She is also on denosumab/Prolia every 6 months for her osteopenia. Dose is due today. She also requests a flu shot.  REVIEW OF SYSTEMS: Berneice's anemia is much improved. She has still significant active pain. She is working with Dr. Brien Few regarding this. The pain does severely limit what she can get away with in terms of activity. She also  has right knee pain. She has some sinus problems, some ankle swelling, some stress urinary incontinence, and feels forgetful at times. Overall however a detailed review of systems today was stable  PAST MEDICAL HISTORY: Past Medical History:  Diagnosis Date  . Arthritis   . Breast cancer ([place]) 03/15/13   right, 12 o'clock  . Chronic kidney disease   . Diabetes mellitus without complication ([place])   . GERD (gastroesophageal reflux disease)   . History of lower GI bleeding   . Hyperkalemia 12/21/2015  . Hyperlipidemia   . Hypertension   . IBS (irritable bowel syndrome)   . Neuromuscular disorder ([place])    peripheral neuropathy feet  . Osteoporosis     PAST SURGICAL HISTORY: Past Surgical History:  Procedure Laterality Date  . ABDOMINAL HYSTERECTOMY  1975   No salpingo-oophorectomy  . BREAST LUMPECTOMY WITH NEEDLE LOCALIZATION AND AXILLARY SENTINEL LYMPH NODE BX Right 04/28/2013   Procedure: BREAST LUMPECTOMY WITH NEEDLE LOCALIZATION AND AXILLARY SENTINEL LYMPH NODE BX;  Surgeon: Rolm Bookbinder, MD;  Location: [facility];  Service: General;  Laterality: Right;  . CARDIAC CATHETERIZATION N/A 01/26/2015   Procedure: Right/Left Heart Cath and Coronary Angiography;  Surgeon: Jolaine Artist, MD;  Location: [facility];  Service: Cardiovascular;  Laterality: N/A;  . CATARACT EXTRACTION  2009   both  . COLONOSCOPY    . SKIN CANCER EXCISION     head, face, hand ..multiple years  . TONSILLECTOMY      FAMILY HISTORY Family History  Problem Relation Age of Onset  . Cancer Maternal Aunt 80    colon cancer  .  Cancer Maternal Uncle 65    lung  . Cancer Maternal Grandmother     pancreas  . Cerebral aneurysm Father    the patient's father died at the age of 33 from a subarachnoid hemorrhage. The patient's mother lived to be and 12.63 years old. She lived independently to be and. The patient had one brother, no sisters. There is a history of colon cancer or in a  maternal aunts, diagnosed at age 24, and lung cancer in a maternal uncle diagnosed at age 59. There is no history of breast or ovarian cancer in the family  GYNECOLOGIC HISTORY:  (updated December 2000 410)  Rosalene can't remember when she had her first period. First live birth came at age 48 and she is [place] 2. She she took hormone replacement, namely estrogen, until 2014 when she was diagnosed with breast cancer.  SOCIAL HISTORY:  (Updated December 2014. 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 Her husband [name] is retired, he is largely disabled secondary to back problems following a fall. Daughter Maleni Seyer is a retired Pharmacist, hospital living in [place]. [name] older son runs a Engineer, civil (consulting) and business in [place] and [name] helps out with that. The patient's son [name] lives in [place]. He is an Chief Financial Officer. Altogether Blimie has 4 grandsons, 3 great-grandsons and one great-granddaughter    ADVANCED DIRECTIVES: In place   HEALTH MAINTENANCE:  (Updated January 2015) Social History  Substance Use Topics  . Smoking status: Never Smoker  . Smokeless tobacco: Never Used  . Alcohol use No     Colonoscopy: 2011  PAP: Status post hysterectomy  Bone density:Dec 2014, [place], osteopenia with T score of -1.9  Lipid panel: Not on file/Dr. Maxwell Caul   Allergies  Allergen Reactions  . Levaquin [Levofloxacin In D5w] Anaphylaxis and Nausea Only    Shaking real bad on medicaton  . Codeine Nausea And Vomiting    Pass out  . Herceptin [Trastuzumab] Rash    Patient has developed extensive body skin rash following each Herceptin treatment of increased presence following each treatment  that cleared with oral steroids  . Sulfa Antibiotics Swelling  . Tape Rash    Blisters     Current Outpatient Prescriptions  Medication Sig Dispense Refill  . allopurinol (ZYLOPRIM) 100 MG tablet Take 100 mg by mouth daily with supper. Take with 300 mg  tablet for a total of 439m daily    . anastrozole (ARIMIDEX) 1 MG tablet TAKE 1 TABLET BY MOUTH DAILY. 90 tablet 1  . aspirin 81 MG tablet Take 81 mg by mouth at bedtime.     .Marland Kitchenatorvastatin (LIPITOR) 20 MG tablet Take 20 mg by mouth every morning.     . calcium carbonate (OS-CAL) 600 MG TABS tablet Take 600 mg by mouth 2 (two) times daily with a meal.     . carvedilol (COREG) 3.125 MG tablet TAKE 1 TABLET (3.125 MG TOTAL) BY MOUTH 2 (TWO) TIMES DAILY WITH A MEAL. 180 tablet 3  . denosumab (PROLIA) 60 MG/ML SOLN injection Inject 60 mg into the skin every 6 (six) months. Administer in upper arm, thigh, or abdomen    . fenofibrate (TRICOR) 48 MG tablet Take 48 mg by mouth daily.    . ferrous sulfate 325 (65 FE) MG EC tablet Take 325 mg by mouth daily with breakfast.    . furosemide (LASIX) 40 MG tablet Take 40 mg by mouth. Take 464mevery morning  and an additional 70m every evening as needed for swelling    . gabapentin (NEURONTIN) 300 MG capsule TAKE 1 CAPSULE BY MOUTH AT BEDTIME 90 capsule 1  . glimepiride (AMARYL) 2 MG tablet Take 2 mg by mouth daily with breakfast.    . loratadine (CLARITIN) 10 MG tablet Take 10 mg by mouth every morning.     . Melatonin 5 MG TABS Take 5 mg by mouth at bedtime as needed (As needed for sleep).    . minoxidil (ROGAINE) 2 % external solution Apply 1 application topically 3 (three) times a week.     . Multiple Vitamin (MULTIVITAMIN WITH MINERALS) TABS tablet Take 1 tablet by mouth 2 (two) times daily.     .Marland Kitchenomeprazole (PRILOSEC) 20 MG capsule Take 20 mg by mouth daily.     . potassium chloride SA (K-DUR,KLOR-CON) 10 MEQ tablet Take 2 tablets (20 mEq total) by mouth daily. 60 tablet 3  . traMADol (ULTRAM) 50 MG tablet Take 50 mg by mouth every 6 (six) hours as needed for moderate pain.     . Vitamin D, Ergocalciferol, (DRISDOL) 50000 UNITS CAPS capsule Take 50,000 Units by mouth every 14 (fourteen) days.     . vitamin E 100 UNIT capsule Take 100 Units by mouth  daily.     No current facility-administered medications for this visit.     OBJECTIVE: Older white woman In no acute distress Vitals:   04/11/16 1442  BP: 133/74  Pulse: 67  Resp: 18  Temp: 97.7 F (36.5 C)     Body mass index is 30.57 kg/m.    ECOG FS:2 - Symptomatic, <50% confined to bed Filed Weights   04/11/16 1442  Weight: 161 lb 12.8 oz (73.4 kg)     Sclerae unicteric, pupils round and equal Oropharynx clear and moist-- no thrush or other lesions No cervical or supraclavicular adenopathy Lungs no rales or rhonchi Heart regular rate and rhythm Abd soft, nontender, positive bowel sounds MSK no significant spinal tenderness to moderate palpation, no upper extremity lymphedema Neuro: nonfocal, well oriented, appropriate affect Breasts: The right breast is status post lumpectomy. There is no evidence of disease recurrence. The right axilla is benign. The left breast is unremarkable.    LAB RESULTS:   Lab Results  Component Value Date   WBC 3.7 (L) 04/11/2016   NEUTROABS 1.8 04/11/2016   HGB 11.3 (L) 04/11/2016   HCT 33.7 (L) 04/11/2016   MCV 99.4 04/11/2016   PLT 157 04/11/2016      Chemistry      Component Value Date/Time   NA 142 04/11/2016 1338   K 3.8 04/11/2016 1338   CL 108 01/01/2016 1001   CO2 26 04/11/2016 1338   BUN 24.5 04/11/2016 1338   CREATININE 1.7 (H) 04/11/2016 1338      Component Value Date/Time   CALCIUM 9.6 04/11/2016 1338   ALKPHOS 56 04/11/2016 1338   AST 34 04/11/2016 1338   ALT 46 04/11/2016 1338   BILITOT 0.41 04/11/2016 1338       STUDIES: CLINICAL DATA: 82year old female for annual bilateral mammograms. History of right breast cancer and lumpectomy in 2014  EXAM: DIGITAL DIAGNOSTIC BILATERAL MAMMOGRAM WITH 3D TOMOSYNTHESIS AND CAD  COMPARISON: Previous exam(s).  ACR Breast Density Category b: There are scattered areas of fibroglandular density.  FINDINGS: 2D and 3D CC and MLO views of both breasts and a  magnification view of the right lumpectomy site are performed.  Surgical scarring within the retroareolar  right breast identified.  There is no evidence of suspicious mass, nonsurgical distortion or worrisome calcifications bilaterally.  Mammographic images were processed with CAD.  IMPRESSION: No mammographic evidence of breast malignancy.  Right breast scarring.  RECOMMENDATION: Bilateral diagnostic mammograms in 1 year.  I have discussed the findings and recommendations with the patient. Results were also provided in writing at the conclusion of the visit. If applicable, a reminder letter will be sent to the patient regarding the next appointment.  BI-RADS CATEGORY 2: Benign.   Electronically Signed  By: Margarette Canada M.D.  On: 04/28/2015 13:51  Transthoracic Echocardiography  Patient:  Izamar, Linden MR #:    [id number] Study Date: 06/12/2015 Gender:   F Age:    69 Height:   154.9 cm Weight:   72.6 kg BSA:    1.79 m^2 Pt. Status: Room:  ATTENDING  [name] [id number] ORDERING   Bensimhon, Glynis Smiles  Glori Bickers SONOGRAPHER Jimmy Reel, RDCS PERFORMING  [facility], Outpatient  cc:  ------------------------------------------------------------------- LV EF: 55% -  60%    ASSESSMENT: 82 y.o. [place] woman, status post right breast upper-outer quadrant biopsy 03/15/2013 for a clinical T1c N0, stage IA invasive ductal carcinoma, grade 1 or 2, estrogen receptor 100% positive, progesterone receptor 100% positive, with an MIB-1 of 30%, and HER-2 amplified by CISH, with a ratio of 3.51 and an average HER-2 copy number Damita Lack of 6.85  (1) biopsy of 2 suspicious areas in the left breast both showed no evidence of malignancy  (2) right lumpectomy and sentinel lymph node sampling 04/28/2013 showed a pT1b pN0, stage IA invasive ductal carcinoma, grade 2, with negative margins  (a) did not need radiation  therapy  (3) trastuzumab started 05/31/2013, given every 3 weeks for total of 9 doses; held as of 07/16/2013 after 3 doses because of possible allergic dermatitis associated with the drug.   (4) anastrozole started 05/08/2013  (a) dexa scan 06/10/2013 showed osteopenia with a T score of -2.1  (b) zolendronate started 09/13/2013, repeated 04/17/2015  (c) switched to denosumab/Prolia as of April 2017  (5) congestive heart failure in the setting of sepsis August 2016, with normal repeat echo December 2016   PLAN: Aysiah is now 3 years out from definitive surgery for her breast cancer with no evidence of disease recurrence. This is very favorable.  She continues on anastrozole, with good tolerance and the plan is to continue that to a total of 5 years, which will take [place] to November 2019.  She does have osteopenia and she is due for repeat bone density which she will have next month at the same time as her mammogram.  She went ahead and got her flu shot today as well as her Prolia shot. She will return in 6 months for Prolia again.  She will then see me a year from now after her 2018 mammography. She knows to call for any problems that may develop before that visit.Marland Kitchen   Chauncey Cruel, MD   04/11/2016 2:51 PM

## 2016-04-19 ENCOUNTER — Other Ambulatory Visit: Payer: Self-pay | Admitting: *Deleted

## 2016-04-19 NOTE — Patient Outreach (Signed)
[facility]) Care Management  04/19/2016  Rebecca Hall [birth year] [id number]    RN Health Coach telephone call to patient.  Hipaa compliance verified.  Per patient she is doing good. Per patient she just had guest come in for dinner. RN Health coach explained that she would call back within 14 days. Plan: RN will call patient again within 14 days.  [facility] Care Management [phone]

## 2016-04-23 ENCOUNTER — Other Ambulatory Visit: Payer: Self-pay | Admitting: *Deleted

## 2016-04-23 NOTE — Patient Outreach (Signed)
[facility]) Care Management  04/23/2016   Rebecca Hall 19-Oct-1931 [id number]  Subjective: RN Health Coach telephone call to patient.  Hipaa compliance verified. Per patient her weight is 155 pounds. Per patient her weight has been running 155-159. Patient stated she takes the extra fluid pill in the evening when there is a weight increase. Patient stated she is doing very well and feels the best she has in a long time. Patient is in the green zone today. Per patient her appetite is good. Patient is unable to wear support hose due to orthopaedic problems with feet and gout.  Patient has received her flu shot. Per patient she is a diabetic and is on medication and asked should she check her blood sugar . Patient A1C was 8.4 on 12/20/2015. Patient will discuss this with her physician on visit in May 21, 2016 visit. Patient will have her mammogram done in November. Patient has agreed to follow up outreach calls .  Objective:   Current Medications:  Current Outpatient Prescriptions  Medication Sig Dispense Refill  . allopurinol (ZYLOPRIM) 100 MG tablet Take 100 mg by mouth daily with supper. Take with 300 mg tablet for a total of 449m daily    . anastrozole (ARIMIDEX) 1 MG tablet TAKE 1 TABLET BY MOUTH DAILY. 90 tablet 1  . aspirin 81 MG tablet Take 81 mg by mouth at bedtime.     .Marland Kitchenatorvastatin (LIPITOR) 20 MG tablet Take 20 mg by mouth every morning.     . calcium carbonate (OS-CAL) 600 MG TABS tablet Take 600 mg by mouth 2 (two) times daily with a meal.     . carvedilol (COREG) 3.125 MG tablet TAKE 1 TABLET (3.125 MG TOTAL) BY MOUTH 2 (TWO) TIMES DAILY WITH A MEAL. 180 tablet 3  . denosumab (PROLIA) 60 MG/ML SOLN injection Inject 60 mg into the skin every 6 (six) months. Administer in upper arm, thigh, or abdomen    . fenofibrate (TRICOR) 48 MG tablet Take 48 mg by mouth daily.    . ferrous sulfate 325 (65 FE) MG EC tablet Take 325 mg by mouth daily with breakfast.    .  furosemide (LASIX) 40 MG tablet Take 40 mg by mouth. Take 445mevery morning and an additional 2073mvery evening as needed for swelling    . gabapentin (NEURONTIN) 300 MG capsule TAKE 1 CAPSULE BY MOUTH AT BEDTIME 90 capsule 1  . glimepiride (AMARYL) 2 MG tablet Take 2 mg by mouth daily with breakfast.    . loratadine (CLARITIN) 10 MG tablet Take 10 mg by mouth every morning.     . Melatonin 5 MG TABS Take 5 mg by mouth at bedtime as needed (As needed for sleep).    . Multiple Vitamin (MULTIVITAMIN WITH MINERALS) TABS tablet Take 1 tablet by mouth 2 (two) times daily.     . oMarland Kitcheneprazole (PRILOSEC) 20 MG capsule Take 20 mg by mouth daily.     . potassium chloride SA (K-DUR,KLOR-CON) 10 MEQ tablet Take 2 tablets (20 mEq total) by mouth daily. 60 tablet 3  . traMADol (ULTRAM) 50 MG tablet Take 50 mg by mouth every 6 (six) hours as needed for moderate pain.     . Vitamin D, Ergocalciferol, (DRISDOL) 50000 UNITS CAPS capsule Take 50,000 Units by mouth every 14 (fourteen) days.     . vitamin E 100 UNIT capsule Take 100 Units by mouth daily.    . minoxidil (ROGAINE) 2 % external solution Apply  1 application topically 3 (three) times a week.      No current facility-administered medications for this visit.     Functional Status:  In your present state of health, do you have any difficulty performing the following activities: 04/23/2016 03/06/2016  Hearing? N N  Vision? N N  Difficulty concentrating or making decisions? N N  Walking or climbing stairs? N N  Dressing or bathing? N N  Doing errands, shopping? N N  Preparing Food and eating ? N N  Using the Toilet? N N  In the past six months, have you accidently leaked urine? N N  Do you have problems with loss of bowel control? N N  Managing your Medications? N N  Managing your Finances? N N  Housekeeping or managing your Housekeeping? [name]  Some recent data might be hidden    Fall/Depression Screening: PHQ 2/9 Scores 04/23/2016 03/06/2016  01/30/2016 01/01/2016 03/29/2015  PHQ - 2 Score 0 0 0 0 0   THN CM Care Plan Problem One   Flowsheet Row Most Recent Value  Care Plan Problem One  Knowledge deficit in self management of Congestive Heart Failure  Role Documenting the Problem One  [place] for Problem One  Active  THN Long Term Goal (31-90 days)  Patient will not have any readmissions to congestive Heart Failure within the next 90 days  Interventions for Problem One [place] reminded the patient ot keep appointments with PCP and Cardiologist. RN eminded patient the importance ofd taking medications as prescribed. RN will monitor patient monthly telephonic  THN CM Short Term Goal #3 Met Date  04/23/16  Interventions for Short Tern Goal #3  RN sent patient EMMI educational material on cholesterol and understanding cholesterol results. RN sent patient educational material on fat and cholesterol restricted diet. RN will follow up with discussion and teach back  THN CM Short Term Goal #4 (0-30 days)  Patient will continue to check daily weight and document within the next 30 days  THN CM Short Term Goal #4 Start Date  04/23/16  Interventions for Short Term Goal #4  RN sent patient a 2017-2018 calendar book. Patient discussed the inportance of daily weiht and zones and the additional fluid pill prescribed for evenings with additonal fluid gain. RN will follow with discussion and teach back      Assessment:  Patient received flu shot Patient is in the green zone Patient will continue to benefit from Health Coach telephonic outreach for education and support for diabetes self management.  Plan:  RN sent 2017-2018 calendar book  RN sent educational material on diabetes and checking blood sugars RN sent educational EMMI information on flu, pneumonia and cold RN will follow up within the month of November  Lanee Chain [place] Care  Management [phone]

## 2016-04-24 NOTE — Telephone Encounter (Signed)
Lab checked on 04-11-2016

## 2016-04-29 ENCOUNTER — Ambulatory Visit
Admission: RE | Admit: 2016-04-29 | Discharge: 2016-04-29 | Disposition: A | Discharge status: Home / Self Care | Payer: PPO | Source: Ambulatory Visit | Attending: Oncology | Admitting: Oncology

## 2016-04-29 ENCOUNTER — Other Ambulatory Visit: Payer: PPO

## 2016-04-29 DIAGNOSIS — Z853 Personal history of malignant neoplasm of breast: Secondary | ICD-10-CM

## 2016-04-29 DIAGNOSIS — R928 Other abnormal and inconclusive findings on diagnostic imaging of breast: Secondary | ICD-10-CM | POA: Diagnosis not present

## 2016-05-03 ENCOUNTER — Ambulatory Visit: Payer: Self-pay | Admitting: *Deleted

## 2016-05-20 DIAGNOSIS — C44712 Basal cell carcinoma of skin of right lower limb, including hip: Secondary | ICD-10-CM | POA: Diagnosis not present

## 2016-05-20 DIAGNOSIS — L900 Lichen sclerosus et atrophicus: Secondary | ICD-10-CM | POA: Diagnosis not present

## 2016-05-21 DIAGNOSIS — E1122 Type 2 diabetes mellitus with diabetic chronic kidney disease: Secondary | ICD-10-CM | POA: Diagnosis not present

## 2016-05-21 DIAGNOSIS — N183 Chronic kidney disease, stage 3 (moderate): Secondary | ICD-10-CM | POA: Diagnosis not present

## 2016-05-21 DIAGNOSIS — C50919 Malignant neoplasm of unspecified site of unspecified female breast: Secondary | ICD-10-CM | POA: Diagnosis not present

## 2016-05-21 DIAGNOSIS — M8588 Other specified disorders of bone density and structure, other site: Secondary | ICD-10-CM | POA: Diagnosis not present

## 2016-05-21 DIAGNOSIS — I129 Hypertensive chronic kidney disease with stage 1 through stage 4 chronic kidney disease, or unspecified chronic kidney disease: Secondary | ICD-10-CM | POA: Diagnosis not present

## 2016-05-21 DIAGNOSIS — Z7984 Long term (current) use of oral hypoglycemic drugs: Secondary | ICD-10-CM | POA: Diagnosis not present

## 2016-05-21 DIAGNOSIS — Z79899 Other long term (current) drug therapy: Secondary | ICD-10-CM | POA: Diagnosis not present

## 2016-05-22 ENCOUNTER — Other Ambulatory Visit: Payer: Self-pay | Admitting: *Deleted

## 2016-05-22 NOTE — Patient Outreach (Signed)
[facility]) Care Management  05/22/2016   Rebecca Hall 1932/03/09 [id number]  Subjective: RN Health Coach telephone call to patient.  Hipaa compliance verified. Per patient she is wearing daily. Patient is keeping her appointments. Patient is taking medications as prescribed. Patient is aware of zones and is in the green zone now. Patient does not have any swelling in extremities. Per patient Dr Felipa Eth is looking to see if patient will need to check her blood sugars. Patient has not had any ED visits since 12/20/2015.   Objective:   Current Medications:  Current Outpatient Prescriptions  Medication Sig Dispense Refill  . allopurinol (ZYLOPRIM) 100 MG tablet Take 100 mg by mouth daily with supper. Take with 300 mg tablet for a total of 400mg  daily    . anastrozole (ARIMIDEX) 1 MG tablet TAKE 1 TABLET BY MOUTH DAILY. 90 tablet 1  . aspirin 81 MG tablet Take 81 mg by mouth at bedtime.     Marland Kitchen atorvastatin (LIPITOR) 20 MG tablet Take 20 mg by mouth every morning.     . calcium carbonate (OS-CAL) 600 MG TABS tablet Take 600 mg by mouth 2 (two) times daily with a meal.     . carvedilol (COREG) 3.125 MG tablet TAKE 1 TABLET (3.125 MG TOTAL) BY MOUTH 2 (TWO) TIMES DAILY WITH A MEAL. 180 tablet 3  . denosumab (PROLIA) 60 MG/ML SOLN injection Inject 60 mg into the skin every 6 (six) months. Administer in upper arm, thigh, or abdomen    . fenofibrate (TRICOR) 48 MG tablet Take 48 mg by mouth daily.    . ferrous sulfate 325 (65 FE) MG EC tablet Take 325 mg by mouth daily with breakfast.    . furosemide (LASIX) 40 MG tablet Take 40 mg by mouth. Take 40mg  every morning and an additional 20mg  every evening as needed for swelling    . gabapentin (NEURONTIN) 300 MG capsule TAKE 1 CAPSULE BY MOUTH AT BEDTIME 90 capsule 1  . glimepiride (AMARYL) 2 MG tablet Take 2 mg by mouth daily with breakfast.    . loratadine (CLARITIN) 10 MG tablet Take 10 mg by mouth every morning.     . Melatonin  5 MG TABS Take 5 mg by mouth at bedtime as needed (As needed for sleep).    . minoxidil (ROGAINE) 2 % external solution Apply 1 application topically 3 (three) times a week.     . Multiple Vitamin (MULTIVITAMIN WITH MINERALS) TABS tablet Take 1 tablet by mouth 2 (two) times daily.     Marland Kitchen omeprazole (PRILOSEC) 20 MG capsule Take 20 mg by mouth daily.     . potassium chloride SA (K-DUR,KLOR-CON) 10 MEQ tablet Take 2 tablets (20 mEq total) by mouth daily. 60 tablet 3  . traMADol (ULTRAM) 50 MG tablet Take 50 mg by mouth every 6 (six) hours as needed for moderate pain.     . Vitamin D, Ergocalciferol, (DRISDOL) 50000 UNITS CAPS capsule Take 50,000 Units by mouth every 14 (fourteen) days.     . vitamin E 100 UNIT capsule Take 100 Units by mouth daily.     No current facility-administered medications for this visit.     Functional Status:  In your present state of health, do you have any difficulty performing the following activities: 05/22/2016 04/23/2016  Hearing? N N  Vision? N N  Difficulty concentrating or making decisions? N N  Walking or climbing stairs? N N  Dressing or bathing? N N  Doing errands,  shopping? N N  Preparing Food and eating ? - N  Using the Toilet? - N  In the past six months, have you accidently leaked urine? - N  Do you have problems with loss of bowel control? - N  Managing your Medications? - N  Managing your Finances? - N  Housekeeping or managing your Housekeeping? - Y  Some recent data might be hidden    Fall/Depression Screening: PHQ 2/9 Scores 05/22/2016 04/23/2016 03/06/2016 01/30/2016 01/01/2016 03/29/2015  PHQ - 2 Score 0 0 0 0 0 0   THN CM Care Plan Problem One   Flowsheet Row Most Recent Value  Care Plan Problem One  Knowledge deficit in self management of Congestive Heart Failure  Role Documenting the Problem One  [place] for Problem One  Active  THN Long Term Goal (31-90 days)  Patient will not have any readmissions to congestive Heart  Failure within the next 90 days  Interventions for Problem One [place] reminded the patient ot keep appointments with PCP and Cardiologist. RN eminded patient the importance ofd taking medications as prescribed. RN will monitor patient monthly telephonic  THN CM Short Term Goal #4 (0-30 days)  Patient will continue to check daily weight and document within the next 30 days  Interventions for Short Term Goal #4  RN sent patient a 2017-2018 calendar book. Patient discussed the inportance of daily weiht and zones and the additional fluid pill prescribed for evenings with additonal fluid gain. RN will follow with discussion and teach back      Assessment:  Patient is in the green zone Patient is taking medications as prescribed Patient has not had an ED visit since 11/2015   Plan:  RN will follow up with patient in the month of January to see how patient does with diet and care through the holidays and look at the potential of case closure.  [place] Care Management [phone]

## 2016-05-28 DIAGNOSIS — M8580 Other specified disorders of bone density and structure, unspecified site: Secondary | ICD-10-CM | POA: Diagnosis not present

## 2016-05-28 DIAGNOSIS — E559 Vitamin D deficiency, unspecified: Secondary | ICD-10-CM | POA: Diagnosis not present

## 2016-05-28 DIAGNOSIS — E669 Obesity, unspecified: Secondary | ICD-10-CM | POA: Diagnosis not present

## 2016-05-28 DIAGNOSIS — Z7984 Long term (current) use of oral hypoglycemic drugs: Secondary | ICD-10-CM | POA: Diagnosis not present

## 2016-05-28 DIAGNOSIS — Z6831 Body mass index (BMI) 31.0-31.9, adult: Secondary | ICD-10-CM | POA: Diagnosis not present

## 2016-05-28 DIAGNOSIS — E1122 Type 2 diabetes mellitus with diabetic chronic kidney disease: Secondary | ICD-10-CM | POA: Diagnosis not present

## 2016-06-28 ENCOUNTER — Ambulatory Visit (HOSPITAL_COMMUNITY)
Admission: RE | Admit: 2016-06-28 | Discharge: 2016-06-28 | Disposition: A | Discharge status: Home / Self Care | Payer: PPO | Source: Ambulatory Visit | Attending: Internal Medicine | Admitting: Internal Medicine

## 2016-06-28 ENCOUNTER — Encounter (HOSPITAL_COMMUNITY): Payer: Self-pay | Admitting: Internal Medicine

## 2016-06-28 VITALS — BP 162/78 | HR 67 | Wt 161.4 lb

## 2016-06-28 DIAGNOSIS — E875 Hyperkalemia: Secondary | ICD-10-CM | POA: Diagnosis not present

## 2016-06-28 DIAGNOSIS — I2510 Atherosclerotic heart disease of native coronary artery without angina pectoris: Secondary | ICD-10-CM | POA: Insufficient documentation

## 2016-06-28 DIAGNOSIS — E785 Hyperlipidemia, unspecified: Secondary | ICD-10-CM | POA: Insufficient documentation

## 2016-06-28 DIAGNOSIS — E1122 Type 2 diabetes mellitus with diabetic chronic kidney disease: Secondary | ICD-10-CM | POA: Insufficient documentation

## 2016-06-28 DIAGNOSIS — N183 Chronic kidney disease, stage 3 (moderate): Secondary | ICD-10-CM | POA: Insufficient documentation

## 2016-06-28 DIAGNOSIS — I428 Other cardiomyopathies: Secondary | ICD-10-CM | POA: Diagnosis not present

## 2016-06-28 DIAGNOSIS — K219 Gastro-esophageal reflux disease without esophagitis: Secondary | ICD-10-CM | POA: Diagnosis not present

## 2016-06-28 DIAGNOSIS — I130 Hypertensive heart and chronic kidney disease with heart failure and stage 1 through stage 4 chronic kidney disease, or unspecified chronic kidney disease: Secondary | ICD-10-CM | POA: Diagnosis not present

## 2016-06-28 DIAGNOSIS — Z7982 Long term (current) use of aspirin: Secondary | ICD-10-CM | POA: Insufficient documentation

## 2016-06-28 DIAGNOSIS — I5022 Chronic systolic (congestive) heart failure: Secondary | ICD-10-CM | POA: Diagnosis not present

## 2016-06-28 DIAGNOSIS — I5032 Chronic diastolic (congestive) heart failure: Secondary | ICD-10-CM | POA: Insufficient documentation

## 2016-06-28 DIAGNOSIS — R0683 Snoring: Secondary | ICD-10-CM | POA: Insufficient documentation

## 2016-06-28 DIAGNOSIS — Z79899 Other long term (current) drug therapy: Secondary | ICD-10-CM | POA: Insufficient documentation

## 2016-06-28 DIAGNOSIS — I10 Essential (primary) hypertension: Secondary | ICD-10-CM

## 2016-06-28 DIAGNOSIS — Z853 Personal history of malignant neoplasm of breast: Secondary | ICD-10-CM | POA: Diagnosis not present

## 2016-06-28 DIAGNOSIS — M810 Age-related osteoporosis without current pathological fracture: Secondary | ICD-10-CM | POA: Insufficient documentation

## 2016-06-28 LAB — BASIC METABOLIC PANEL
Anion gap: 12 (ref 5–15)
BUN: 17 mg/dL (ref 6–20)
CO2: 27 mmol/L (ref 22–32)
Calcium: 9.9 mg/dL (ref 8.9–10.3)
Chloride: 100 mmol/L — ABNORMAL LOW (ref 101–111)
Creatinine, Ser: 1.52 mg/dL — ABNORMAL HIGH (ref 0.44–1.00)
GFR calc Af Amer: 36 mL/min — ABNORMAL LOW (ref >60)
GFR calc non Af Amer: 31 mL/min — ABNORMAL LOW (ref >60)
Glucose, Bld: 208 mg/dL — ABNORMAL HIGH (ref 65–99)
Potassium: 3.4 mmol/L — ABNORMAL LOW (ref 3.5–5.1)
Sodium: 139 mmol/L (ref 135–145)

## 2016-06-28 MED ORDER — AMLODIPINE BESYLATE 2.5 MG PO TABS: 2.5000 mg | ORAL_TABLET | Freq: Every day | ORAL | 3 refills | Status: DC

## 2016-06-28 NOTE — Addendum Note (Signed)
Encounter addended by: Kennieth Rad, RN on: 06/28/2016 11:34 AM<BR>    Actions taken: Order list changed, Diagnosis association updated

## 2016-06-28 NOTE — Addendum Note (Signed)
Encounter addended by: Kennieth Rad, RN on: 06/28/2016 11:30 AM<BR>    Actions taken: Sign clinical note

## 2016-06-28 NOTE — Addendum Note (Signed)
Encounter addended by: Kennieth Rad, RN on: 06/28/2016 11:30 AM<BR>    Actions taken: Visit diagnoses modified, Order list changed, Diagnosis association updated, Sign clinical note

## 2016-06-28 NOTE — Patient Instructions (Addendum)
START taking Amlodipine 2.5 mg (1 Tab) Once daily  Labs today (will call for abnormal results, otherwise no news is good news)  Follow up in 6 months with ECHO

## 2016-06-28 NOTE — Progress Notes (Signed)
Advanced Heart Failure Clinic Note   Patient ID: Rebecca Hall, female   DOB: 1931-10-23, 83 y.o.   MRN: [id number] PCP: Dr Felipa Eth.  Primary HF: Dr Haroldine Laws Oncologist: Dr Jana Hakim  HPI: Rebecca Hall is an 83 y.o. female with history of Breast Cancer treated with 3 doses only of herceptin in 2014, CKD, HTN, HLD, Osteoporosis, GERD, Hx of GI Bleed, and DM.  Admitted to [facility] 8/16 with hypovolemic shock, AKI creatinine 4.7. After fluid resuscitation developed pulmonary edema. Found to have new onset LV dysfunction. Had cath as noted below with 1v CAD with 60% proximal LAD stenosis and NICM. Discharged on lasix, carvedilol, and Entresto. Discharge weight was 156 pounds.   Admitted 06/30/15 with weakness and dizziness and found to have AKI, hyperkalemia, and hypotension. (Creatinine 3.69 and K 7.4 on admit) Pt had stopped spiro several days prior with dizziness. K treated in ED as well as inpatient setting. Received IV fluid for hypotension.    She returns HF follow up. Overall feeling ok. Says she is taking lasix 40/20 every day now. Has helped some. Exertional dyspnea is stable. Can do all ADLs without problem. Goes to store occasionally.  No CP. Weight stable. BP fluctuates at home. Says typical SBP close to 150s    [facility] 01/27/2015  RA = 11 RV = 42/8/15 PA = 38/8 (24) PCW = 19 Fick cardiac output/index = 5.2/3.0 Thermo CO/CI = 5.7/3.2 PVR = 1.0 WU FA sat = 93% PA sat = 51%, 51% Ao Pressure: 133/60 (85) LV Pressure: 133/9/17 No aortic stenosis  Assessment: 1) Moderate 1v CAD with 60% proximal LAD lesion 2) Non-ischemic CM with EF 30-35% by echo 3) Mildly elevated R-sided pressures with normal cardiac output  Labs:   06/09/15 K 2.7, Creatinine 1.4 07/14/2015: K 4.2 Creatinine 2.35  07/31/2015: K 4.1 Creatinine 1.34  7/17: K 3.3, creatinine 1.52  ROS: All systems negative except as listed in HPI, PMH and Problem List.  SH:  Social History   Social History  . Marital status:  Married    Spouse name: N/A  . Number of children: N/A  . Years of education: N/A   Occupational History  . Not on file.   Social History Main Topics  . Smoking status: Never Smoker  . Smokeless tobacco: Never Used  . Alcohol use No  . Drug use: No  . Sexual activity: Not on file     Comment: first live birth age 15, P 2, Estrogen    Other Topics Concern  . Not on file   Social History Narrative  . No narrative on file    FH:  Family History  Problem Relation Age of Onset  . Cancer Maternal Aunt 80    colon cancer  . Cancer Maternal Uncle 65    lung  . Cancer Maternal Grandmother     pancreas  . Cerebral aneurysm Father     Past Medical History:  Diagnosis Date  . Arthritis   . Breast cancer ([place]) 03/15/13   right, 12 o'clock  . Chronic kidney disease   . Diabetes mellitus without complication ([place])   . GERD (gastroesophageal reflux disease)   . History of lower GI bleeding   . Hyperkalemia 12/21/2015  . Hyperlipidemia   . Hypertension   . IBS (irritable bowel syndrome)   . Neuromuscular disorder ([place])    peripheral neuropathy feet  . Osteoporosis     Current Outpatient Prescriptions  Medication Sig Dispense Refill  . allopurinol (ZYLOPRIM)  100 MG tablet Take 100 mg by mouth daily with supper. Take with 300 mg tablet for a total of 400mg  daily    . anastrozole (ARIMIDEX) 1 MG tablet TAKE 1 TABLET BY MOUTH DAILY. 90 tablet 1  . aspirin 81 MG tablet Take 81 mg by mouth at bedtime.     Marland Kitchen atorvastatin (LIPITOR) 20 MG tablet Take 20 mg by mouth every morning.     . calcium carbonate (OS-CAL) 600 MG TABS tablet Take 600 mg by mouth 2 (two) times daily with a meal.     . carvedilol (COREG) 3.125 MG tablet TAKE 1 TABLET (3.125 MG TOTAL) BY MOUTH 2 (TWO) TIMES DAILY WITH A MEAL. 180 tablet 3  . denosumab (PROLIA) 60 MG/ML SOLN injection Inject 60 mg into the skin every 6 (six) months. Administer in upper arm, thigh, or abdomen    . fenofibrate (TRICOR) 48 MG tablet  Take 48 mg by mouth daily.    . ferrous sulfate 325 (65 FE) MG EC tablet Take 325 mg by mouth daily with breakfast.    . furosemide (LASIX) 40 MG tablet Take 40 mg by mouth. Take 40mg  every morning and an additional 20mg  every evening as needed for swelling    . gabapentin (NEURONTIN) 300 MG capsule TAKE 1 CAPSULE BY MOUTH AT BEDTIME 90 capsule 1  . glimepiride (AMARYL) 2 MG tablet Take 2 mg by mouth daily with breakfast.    . linagliptin (TRADJENTA) 5 MG TABS tablet Take 5 mg by mouth daily.    Marland Kitchen loratadine (CLARITIN) 10 MG tablet Take 10 mg by mouth every morning.     . Melatonin 5 MG TABS Take 5 mg by mouth at bedtime as needed (As needed for sleep).    . minoxidil (ROGAINE) 2 % external solution Apply 1 application topically 3 (three) times a week.     . Multiple Vitamin (MULTIVITAMIN WITH MINERALS) TABS tablet Take 1 tablet by mouth 2 (two) times daily.     Marland Kitchen omeprazole (PRILOSEC) 20 MG capsule Take 20 mg by mouth daily.     . potassium chloride SA (K-DUR,KLOR-CON) 10 MEQ tablet Take 2 tablets (20 mEq total) by mouth daily. 60 tablet 3  . traMADol (ULTRAM) 50 MG tablet Take 50 mg by mouth every 6 (six) hours as needed for moderate pain.     . Vitamin D, Ergocalciferol, (DRISDOL) 50000 UNITS CAPS capsule Take 50,000 Units by mouth every 14 (fourteen) days.     . vitamin E 100 UNIT capsule Take 100 Units by mouth daily.     No current facility-administered medications for this encounter.     Vitals:   06/28/16 1101  BP: (!) 162/78  Pulse: 67  SpO2: 98%  Weight: 161 lb 6.4 oz (73.2 kg)    Wt Readings from Last 3 Encounters:  06/28/16 161 lb 6.4 oz (73.2 kg)  04/11/16 161 lb 12.8 oz (73.4 kg)  01/01/16 159 lb (72.1 kg)    PHYSICAL EXAM:  General:  Well appearing. NAD. Husband present. Ambulated in the clinic without difficulty.  HEENT: normal Neck: supple. JVP 6. Carotids 2+ bilaterally; no bruits. No thyromegaly or lymphadenopathy noted. Cor: PMI normal. RRR. No M/G/R Lungs:  Clear, no resp distress Abdomen: soft, non-tender, non-distended, no HSM. No bruits or masses. +BS  Extremities: no cyanosis, clubbing, rash. trace edema LLE.  Neuro: alert & orientedx3, cranial nerves grossly intact. Moves all 4 extremities w/o difficulty. Affect pleasant.   ASSESSMENT & PLAN: 1. Chronic diastolic  CHF: She has history of nonischemic cardiomyopathy, but most recent echo in 12/16 showed recovery of LV systolic function, EF 17-51%.   -NYHA II, volume status stable on exam.  - Continue lasix 40/20 mg with extra as needed. - No taking kcl everyday because she is afraid of recurrent hyperkalemia. - Continue carvedilol 3.125 mg twice a day.  - Off lisinopril.  Would not re-challenge given improvement in LV systolic function.  - Will recheck echo to make sure LV function stable. 2. CKD: Stage 3.  Check BMET today.  3. Hx of Breast Cancer: Only received 3 doses of Herceptin in 2014. Stopped due to possible allergy. 4. DM2 5. HTN: BP elevated. Will try low dose amlodipine 2.5 daily. Goal SBP 130-140.  6. HLD: Continue lipitor 20 mg daily 7. Heavy snoring: She declines sleep study.   8. CAD: Nonobstructive on prior cath. No CP. Continue lipitor 20 mg and ASA 81 mg daily.   RTC in 6 months.   Glori Bickers MD 06/28/2016

## 2016-07-01 ENCOUNTER — Other Ambulatory Visit (HOSPITAL_COMMUNITY): Payer: Self-pay | Admitting: Internal Medicine

## 2016-07-02 ENCOUNTER — Telehealth (HOSPITAL_COMMUNITY): Payer: Self-pay | Admitting: *Deleted

## 2016-07-02 DIAGNOSIS — E876 Hypokalemia: Secondary | ICD-10-CM

## 2016-07-02 MED ORDER — POTASSIUM CHLORIDE CRYS ER 10 MEQ PO TBCR: 20.0000 meq | EXTENDED_RELEASE_TABLET | Freq: Two times a day (BID) | ORAL | 3 refills | Status: DC

## 2016-07-02 NOTE — Telephone Encounter (Signed)
Notes Recorded by Scarlette Calico, RN on 07/02/2016 at 1:27 PM EST Pt aware and agreeable, new rx sent in, repeat labs sch for 1/23

## 2016-07-02 NOTE — Telephone Encounter (Signed)
-----   Message from Jolaine Artist, MD sent at 07/01/2016  7:30 PM EST ----- Increase kcl to 20 bid. Recheck 2 weeks.

## 2016-07-08 ENCOUNTER — Other Ambulatory Visit: Payer: Self-pay | Admitting: Oncology

## 2016-07-08 DIAGNOSIS — C50411 Malignant neoplasm of upper-outer quadrant of right female breast: Secondary | ICD-10-CM

## 2016-07-11 ENCOUNTER — Other Ambulatory Visit: Payer: Self-pay | Admitting: *Deleted

## 2016-07-11 NOTE — Patient Outreach (Signed)
[facility]) Care Management  07/11/2016   [name] 12-25-1931 [id number]   RN Health Coach telephone call to patient.  Hipaa compliance verified. Per patient her weight is maintained at 161 pounds. Patient is weighing daily. Per patient she is in the green zone.  Per patient her blood pressure at the Dr office was 162/78. Patient stated that she has started the new medication the physician ordered. Today her blood pressure is 163/73. The physician goal for this patient is SBP 130-140.  Per patient her potassium is 3.4 she has started the potassium 10 meq. Patient stated that she has a fear that her potassium will elevate fast. Per patient she has had problems with that in the past.  Per patient she does snore at night but has been afraid to do a sleep study. Patient stated she has severe claustrophobia and was afraid to go for sleep apnea test due to mask. Patient has been adhering to diet. Patient agreed to follow up outreach call.   Current Medications:  Current Outpatient Prescriptions  Medication Sig Dispense Refill  . allopurinol (ZYLOPRIM) 100 MG tablet Take 100 mg by mouth daily with supper. Take with 300 mg tablet for a total of 400mg  daily    . amLODipine (NORVASC) 2.5 MG tablet Take 1 tablet (2.5 mg total) by mouth daily. 30 tablet 3  . anastrozole (ARIMIDEX) 1 MG tablet TAKE 1 TABLET BY MOUTH DAILY. 90 tablet 1  . aspirin 81 MG tablet Take 81 mg by mouth at bedtime.     Marland Kitchen atorvastatin (LIPITOR) 20 MG tablet Take 20 mg by mouth every morning.     . calcium carbonate (OS-CAL) 600 MG TABS tablet Take 600 mg by mouth 2 (two) times daily with a meal.     . carvedilol (COREG) 3.125 MG tablet TAKE 1 TABLET (3.125 MG TOTAL) BY MOUTH 2 (TWO) TIMES DAILY WITH A MEAL. 180 tablet 3  . denosumab (PROLIA) 60 MG/ML SOLN injection Inject 60 mg into the skin every 6 (six) months. Administer in upper arm, thigh, or abdomen    . fenofibrate (TRICOR) 48 MG tablet Take 48 mg by  mouth daily.    . ferrous sulfate 325 (65 FE) MG EC tablet Take 325 mg by mouth daily with breakfast.    . furosemide (LASIX) 20 MG tablet Take 40mg  every morning and an additional 20mg  every evening as needed for swelling 270 tablet 3  . furosemide (LASIX) 40 MG tablet Take 40 mg by mouth. Take 40mg  every morning and an additional 20mg  every evening as needed for swelling    . gabapentin (NEURONTIN) 300 MG capsule TAKE 1 CAPSULE BY MOUTH AT BEDTIME 90 capsule 1  . glimepiride (AMARYL) 2 MG tablet Take 2 mg by mouth daily with breakfast.    . linagliptin (TRADJENTA) 5 MG TABS tablet Take 5 mg by mouth daily.    Marland Kitchen loratadine (CLARITIN) 10 MG tablet Take 10 mg by mouth every morning.     . Melatonin 5 MG TABS Take 5 mg by mouth at bedtime as needed (As needed for sleep).    . minoxidil (ROGAINE) 2 % external solution Apply 1 application topically 3 (three) times a week.     . Multiple Vitamin (MULTIVITAMIN WITH MINERALS) TABS tablet Take 1 tablet by mouth 2 (two) times daily.     Marland Kitchen omeprazole (PRILOSEC) 20 MG capsule Take 20 mg by mouth daily.     . potassium chloride (K-DUR,KLOR-CON) 10 MEQ tablet  Take 2 tablets (20 mEq total) by mouth 2 (two) times daily. 120 tablet 3  . traMADol (ULTRAM) 50 MG tablet Take 50 mg by mouth every 6 (six) hours as needed for moderate pain.     . Vitamin D, Ergocalciferol, (DRISDOL) 50000 UNITS CAPS capsule Take 50,000 Units by mouth every 14 (fourteen) days.     . vitamin E 100 UNIT capsule Take 100 Units by mouth daily.     No current facility-administered medications for this visit.     Functional Status:  In your present state of health, do you have any difficulty performing the following activities: 07/11/2016 05/22/2016  Hearing? N N  Vision? N N  Difficulty concentrating or making decisions? N N  Walking or climbing stairs? N N  Dressing or bathing? N N  Doing errands, shopping? N N  Preparing Food and eating ? N -  Using the Toilet? N -  In the past  six months, have you accidently leaked urine? N -  Do you have problems with loss of bowel control? N -  Managing your Medications? N -  Managing your Finances? N -  Housekeeping or managing your Housekeeping? Y -  Some recent data might be hidden    Fall/Depression Screening: PHQ 2/9 Scores 07/11/2016 05/22/2016 04/23/2016 03/06/2016 01/30/2016 01/01/2016 03/29/2015  PHQ - 2 Score 0 0 0 0 0 0 0   THN CM Care Plan Problem One   Flowsheet Row Most Recent Value  Care Plan Problem One  Knowledge deficit in self management of Congestive Heart Failure  Role Documenting the Problem One  [place] for Problem One  Active  THN Long Term Goal (31-90 days)  Patient will not have any readmissions to congestive Heart Failure within the next 90 days  Interventions for Problem One [place] reminded the patient ot keep appointments with PCP and Cardiologist. RN eminded patient the importance ofd taking medications as prescribed. RN will monitor patient monthly telephonic  THN CM Short Term Goal #1 (0-30 days)  Patient will be able to verbalize eating low sodium foods and following her diet.within the next 30 days  THN CM Short Term Goal #3 (0-30 days)  Patient will report that potassium is within normal range within the next 30 days  THN CM Short Term Goal #3 Start Date  07/11/16  Interventions for Short Tern Goal #3  RN sent patient eduational material on hyper and hypokalemia. RN discussed with patient about taking potassium as per Dr order. RN will follow up with discussion and teach back  THN CM Short Term Goal #5 (0-30 days)  Patient will verbalize that blood pressure sbp is in the range of 130-140  THN CM Short Term Goal #5 Start Date  07/11/16  Interventions for Short Term Goal #5  RN Health Coach sent educational material on hypertension. RN discussed taking medications as prescribed. Patient will check blood pressure daily and record. RN will follow up with  further  discussion.       Plan:  RN discussed hypertension signs and symptoms and reiterated physician goal of SBP 130-140 RN sent educational material on hypertension RN sent educational material on hypo and hyperkalemia RN sent educational material on Sleep Apnea RN discussed with patient about different masks that can be utilized RN will follow up within the month of February for discussion and teach back  [place] Management [phone]

## 2016-07-16 ENCOUNTER — Ambulatory Visit (HOSPITAL_COMMUNITY)
Admission: RE | Admit: 2016-07-16 | Discharge: 2016-07-16 | Disposition: A | Discharge status: Home / Self Care | Payer: PPO | Source: Ambulatory Visit | Attending: Cardiology | Admitting: Cardiology

## 2016-07-16 ENCOUNTER — Encounter: Payer: Self-pay | Admitting: *Deleted

## 2016-07-16 DIAGNOSIS — E876 Hypokalemia: Secondary | ICD-10-CM | POA: Diagnosis not present

## 2016-07-16 LAB — BASIC METABOLIC PANEL
Anion gap: 11 (ref 5–15)
BUN: 30 mg/dL — ABNORMAL HIGH (ref 6–20)
CO2: 28 mmol/L (ref 22–32)
Calcium: 10.5 mg/dL — ABNORMAL HIGH (ref 8.9–10.3)
Chloride: 97 mmol/L — ABNORMAL LOW (ref 101–111)
Creatinine, Ser: 1.99 mg/dL — ABNORMAL HIGH (ref 0.44–1.00)
GFR calc Af Amer: 26 mL/min — ABNORMAL LOW (ref >60)
GFR calc non Af Amer: 22 mL/min — ABNORMAL LOW (ref >60)
Glucose, Bld: 231 mg/dL — ABNORMAL HIGH (ref 65–99)
Potassium: 4 mmol/L (ref 3.5–5.1)
Sodium: 136 mmol/L (ref 135–145)

## 2016-07-22 ENCOUNTER — Other Ambulatory Visit (HOSPITAL_COMMUNITY): Payer: Self-pay | Admitting: *Deleted

## 2016-07-22 MED ORDER — AMLODIPINE BESYLATE 2.5 MG PO TABS: 2.5000 mg | ORAL_TABLET | Freq: Every day | ORAL | 3 refills | Status: DC

## 2016-07-29 ENCOUNTER — Telehealth (HOSPITAL_COMMUNITY): Payer: Self-pay | Admitting: *Deleted

## 2016-07-29 DIAGNOSIS — E875 Hyperkalemia: Secondary | ICD-10-CM

## 2016-07-29 NOTE — Telephone Encounter (Signed)
Notes Recorded by Kennieth Rad, RN on 07/29/2016 at 9:42 AM EST Called and spoke with patient and she is agreeable to come back in for labs. I have added her to lab schedule and placed orders in epic. ------  Notes Recorded by Jolaine Artist, MD on 07/28/2016 at 5:41 PM EST Repeat this week. ------  Notes Recorded by Scarlette Calico, RN on 07/17/2016 at 4:04 PM EST CR slightly increased, Labs reviewed by RN, will forward to MD for review

## 2016-07-31 ENCOUNTER — Ambulatory Visit (HOSPITAL_COMMUNITY)
Admission: RE | Admit: 2016-07-31 | Discharge: 2016-07-31 | Disposition: A | Discharge status: Home / Self Care | Payer: PPO | Source: Ambulatory Visit | Attending: Internal Medicine | Admitting: Internal Medicine

## 2016-07-31 DIAGNOSIS — E875 Hyperkalemia: Secondary | ICD-10-CM | POA: Diagnosis not present

## 2016-07-31 LAB — BASIC METABOLIC PANEL
Anion gap: 11 (ref 5–15)
BUN: 22 mg/dL — ABNORMAL HIGH (ref 6–20)
CO2: 25 mmol/L (ref 22–32)
Calcium: 9.3 mg/dL (ref 8.9–10.3)
Chloride: 102 mmol/L (ref 101–111)
Creatinine, Ser: 1.35 mg/dL — ABNORMAL HIGH (ref 0.44–1.00)
GFR calc Af Amer: 41 mL/min — ABNORMAL LOW (ref >60)
GFR calc non Af Amer: 35 mL/min — ABNORMAL LOW (ref >60)
Glucose, Bld: 234 mg/dL — ABNORMAL HIGH (ref 65–99)
Potassium: 3.6 mmol/L (ref 3.5–5.1)
Sodium: 138 mmol/L (ref 135–145)

## 2016-08-13 ENCOUNTER — Encounter: Payer: Self-pay | Admitting: *Deleted

## 2016-08-13 ENCOUNTER — Other Ambulatory Visit: Payer: Self-pay | Admitting: *Deleted

## 2016-08-13 NOTE — Patient Outreach (Signed)
[facility]) Care Management  08/13/2016   Rebecca Hall 1931/09/03 [id number]  RN Health Coach telephone call to patient.  Hipaa compliance verified. Per patient she is doing real good. Patient stated she feels the best she has in a while. Per patient she is having very little swelling and that is only when she stays on her feet for a long time. Patient is in the green zone. No shortness of breath. Per patient she is trying to eat healthy. Per patient she has decided that she does not want to go for the sleep study. Patient stated her husband told her she does not snore unless she turns on her back.  Per patient she is taking her medications as prescribed and doing much better.  Current Medications:  Current Outpatient Prescriptions  Medication Sig Dispense Refill  . allopurinol (ZYLOPRIM) 100 MG tablet Take 100 mg by mouth daily with supper. Take with 300 mg tablet for a total of 430m daily    . amLODipine (NORVASC) 2.5 MG tablet Take 1 tablet (2.5 mg total) by mouth daily. 90 tablet 3  . anastrozole (ARIMIDEX) 1 MG tablet TAKE 1 TABLET BY MOUTH DAILY. 90 tablet 1  . aspirin 81 MG tablet Take 81 mg by mouth at bedtime.     .Marland Kitchenatorvastatin (LIPITOR) 20 MG tablet Take 20 mg by mouth every morning.     . calcium carbonate (OS-CAL) 600 MG TABS tablet Take 600 mg by mouth 2 (two) times daily with a meal.     . carvedilol (COREG) 3.125 MG tablet TAKE 1 TABLET (3.125 MG TOTAL) BY MOUTH 2 (TWO) TIMES DAILY WITH A MEAL. 180 tablet 3  . denosumab (PROLIA) 60 MG/ML SOLN injection Inject 60 mg into the skin every 6 (six) months. Administer in upper arm, thigh, or abdomen    . fenofibrate (TRICOR) 48 MG tablet Take 48 mg by mouth daily.    . ferrous sulfate 325 (65 FE) MG EC tablet Take 325 mg by mouth daily with breakfast.    . furosemide (LASIX) 20 MG tablet Take 441mevery morning and an additional 2020mvery evening as needed for swelling 270 tablet 3  . furosemide (LASIX) 40 MG  tablet Take 40 mg by mouth. Take 86m43mery morning and an additional 20mg36mry evening as needed for swelling    . gabapentin (NEURONTIN) 300 MG capsule TAKE 1 CAPSULE BY MOUTH AT BEDTIME 90 capsule 1  . glimepiride (AMARYL) 2 MG tablet Take 2 mg by mouth daily with breakfast.    . linagliptin (TRADJENTA) 5 MG TABS tablet Take 5 mg by mouth daily.    . lorMarland Kitchentadine (CLARITIN) 10 MG tablet Take 10 mg by mouth every morning.     . Melatonin 5 MG TABS Take 5 mg by mouth at bedtime as needed (As needed for sleep).    . minoxidil (ROGAINE) 2 % external solution Apply 1 application topically 3 (three) times a week.     . Multiple Vitamin (MULTIVITAMIN WITH MINERALS) TABS tablet Take 1 tablet by mouth 2 (two) times daily.     . omeMarland Kitchenrazole (PRILOSEC) 20 MG capsule Take 20 mg by mouth daily.     . potassium chloride (K-DUR,KLOR-CON) 10 MEQ tablet Take 2 tablets (20 mEq total) by mouth 2 (two) times daily. 120 tablet 3  . traMADol (ULTRAM) 50 MG tablet Take 50 mg by mouth every 6 (six) hours as needed for moderate pain.     . Vitamin D, Ergocalciferol, (  DRISDOL) 50000 UNITS CAPS capsule Take 50,000 Units by mouth every 14 (fourteen) days.     . vitamin E 100 UNIT capsule Take 100 Units by mouth daily.     No current facility-administered medications for this visit.     Functional Status:  In your present state of health, do you have any difficulty performing the following activities: 08/13/2016 07/11/2016  Hearing? N N  Vision? N N  Difficulty concentrating or making decisions? N N  Walking or climbing stairs? N N  Dressing or bathing? N N  Doing errands, shopping? N N  Preparing Food and eating ? N N  Using the Toilet? N N  In the past six months, have you accidently leaked urine? N N  Do you have problems with loss of bowel control? N N  Managing your Medications? N N  Managing your Finances? N N  Housekeeping or managing your Housekeeping? Rebecca Hall  Some recent data might be hidden     Fall/Depression Screening: PHQ 2/9 Scores 08/13/2016 07/11/2016 05/22/2016 04/23/2016 03/06/2016 01/30/2016 01/01/2016  PHQ - 2 Score 0 0 0 0 0 0 0   THN CM Care Plan Problem One   Flowsheet Row Most Recent Value  Care Plan Problem One  Knowledge deficit in self management of Congestive Heart Failure  Role Documenting the Problem One  [place] for Problem One  Active  THN Long Term Goal Start Date  08/13/16  Interventions for Problem One [place] reminded the patient ot keep appointments with PCP and Cardiologist. RN eminded patient the importance ofd taking medications as prescribed. RN will monitor patient monthly telephonic  THN CM Short Term Goal #3 (0-30 days)  Patient will report that potassium is within normal range within the next 30 days  THN CM Short Term Goal #3 Met Date  08/13/16  Interventions for Short Tern Goal #3  RN sent patient eduational material on hyper and hypokalemia. RN discussed with patient about taking potassium as per Dr order. RN will follow up with discussion and teach back  THN CM Short Term Goal #5 (0-30 days)  Patient will verbalize that blood pressure sbp is in the range of 130-140  THN CM Short Term Goal #5 Met Date  08/13/16  Interventions for Short Term Goal #5  [place] sent educational material on hypertension. RN discussed taking medications as prescribed. Patient will check blood pressure daily and record. RN will follow up with  further discussion.       Assessment Patient will continue to  benefit from Health Coach telephonic outreach for education and support for congestive heart failure self management.  Plan:  RN discussed eating foods low in sodium RN will send a food chain list of appropriate foods to eat out RN discussed edema in lower extremities RN discussed potassium results RN will follow up out reach within the month of April   Rebecca Hall  Management [phone]

## 2016-10-03 ENCOUNTER — Other Ambulatory Visit: Payer: Self-pay | Admitting: *Deleted

## 2016-10-09 ENCOUNTER — Other Ambulatory Visit: Payer: Self-pay

## 2016-10-09 DIAGNOSIS — C50411 Malignant neoplasm of upper-outer quadrant of right female breast: Secondary | ICD-10-CM

## 2016-10-10 ENCOUNTER — Other Ambulatory Visit (HOSPITAL_BASED_OUTPATIENT_CLINIC_OR_DEPARTMENT_OTHER): Payer: PPO

## 2016-10-10 ENCOUNTER — Ambulatory Visit (HOSPITAL_BASED_OUTPATIENT_CLINIC_OR_DEPARTMENT_OTHER): Payer: PPO

## 2016-10-10 VITALS — BP 124/66 | HR 66 | Temp 97.8°F

## 2016-10-10 DIAGNOSIS — M810 Age-related osteoporosis without current pathological fracture: Secondary | ICD-10-CM

## 2016-10-10 DIAGNOSIS — C50411 Malignant neoplasm of upper-outer quadrant of right female breast: Secondary | ICD-10-CM

## 2016-10-10 DIAGNOSIS — M8580 Other specified disorders of bone density and structure, unspecified site: Secondary | ICD-10-CM

## 2016-10-10 DIAGNOSIS — M85811 Other specified disorders of bone density and structure, right shoulder: Secondary | ICD-10-CM

## 2016-10-10 LAB — COMPREHENSIVE METABOLIC PANEL
ALT: 37 U/L (ref 0–55)
AST: 31 U/L (ref 5–34)
Albumin: 3.9 g/dL (ref 3.5–5.0)
Alkaline Phosphatase: 46 U/L (ref 40–150)
Anion Gap: 9 mEq/L (ref 3–11)
BUN: 21.9 mg/dL (ref 7.0–26.0)
CO2: 28 mEq/L (ref 22–29)
Calcium: 9.7 mg/dL (ref 8.4–10.4)
Chloride: 100 mEq/L (ref 98–109)
Creatinine: 1.6 mg/dL — ABNORMAL HIGH (ref 0.6–1.1)
EGFR: 29 mL/min/{1.73_m2} — ABNORMAL LOW (ref >90)
Glucose: 247 mg/dl — ABNORMAL HIGH (ref 70–140)
Potassium: 4.3 mEq/L (ref 3.5–5.1)
Sodium: 138 mEq/L (ref 136–145)
Total Bilirubin: 0.47 mg/dL (ref 0.20–1.20)
Total Protein: 6.9 g/dL (ref 6.4–8.3)

## 2016-10-10 LAB — CBC WITH DIFFERENTIAL/PLATELET
BASO%: 0.5 % (ref 0.0–2.0)
Basophils Absolute: 0 10*3/uL (ref 0.0–0.1)
EOS%: 2.3 % (ref 0.0–7.0)
Eosinophils Absolute: 0.1 10*3/uL (ref 0.0–0.5)
HCT: 34.6 % — ABNORMAL LOW (ref 34.8–46.6)
HGB: 11.6 g/dL (ref 11.6–15.9)
LYMPH%: 33.6 % (ref 14.0–49.7)
MCH: 33.6 pg (ref 25.1–34.0)
MCHC: 33.7 g/dL (ref 31.5–36.0)
MCV: 99.9 fL (ref 79.5–101.0)
MONO#: 0.4 10*3/uL (ref 0.1–0.9)
MONO%: 9.2 % (ref 0.0–14.0)
NEUT#: 2.1 10*3/uL (ref 1.5–6.5)
NEUT%: 54.4 % (ref 38.4–76.8)
Platelets: 152 10*3/uL (ref 145–400)
RBC: 3.46 10*6/uL — ABNORMAL LOW (ref 3.70–5.45)
RDW: 14 % (ref 11.2–14.5)
WBC: 3.8 10*3/uL — ABNORMAL LOW (ref 3.9–10.3)
lymph#: 1.3 10*3/uL (ref 0.9–3.3)

## 2016-10-10 MED ORDER — DENOSUMAB 60 MG/ML ~~LOC~~ SOLN
60.0000 mg | Freq: Once | SUBCUTANEOUS | Status: AC
  Administered 2016-10-10: 60 mg via SUBCUTANEOUS
  Filled 2016-10-10: qty 1

## 2016-10-10 NOTE — Patient Instructions (Signed)

## 2016-10-11 ENCOUNTER — Other Ambulatory Visit: Payer: Self-pay | Admitting: *Deleted

## 2016-10-11 NOTE — Patient Outreach (Signed)
[facility]) Care Management  10/11/2016   Rebecca Hall [birth year] [phone] RN Health Coach telephone call to patient.  Hipaa compliance verified. Patient stated she is doing very well. No shortness of breath or coughing. Patient went to oncology and received Prolia with no reactions. Potassium is within normal range of 4.3. Patient is taking medications as per ordered. Patient is going next week to primary care physician. At that time we will see what the A1C is and see if patient needs to be changed to Diabetes program. Patient has progressed well in the Congestive Heart Failure program. Patient has not had any readmissions for CHF. Patient has agreed to follow up outreach call.   Objective:   Current Medications:  Current Outpatient Prescriptions  Medication Sig Dispense Refill  . allopurinol (ZYLOPRIM) 100 MG tablet Take 100 mg by mouth daily with supper. Take with 300 mg tablet for a total of 400mg  daily    . amLODipine (NORVASC) 2.5 MG tablet Take 1 tablet (2.5 mg total) by mouth daily. 90 tablet 3  . anastrozole (ARIMIDEX) 1 MG tablet TAKE 1 TABLET BY MOUTH DAILY. 90 tablet 1  . aspirin 81 MG tablet Take 81 mg by mouth at bedtime.     Marland Kitchen atorvastatin (LIPITOR) 20 MG tablet Take 20 mg by mouth every morning.     . calcium carbonate (OS-CAL) 600 MG TABS tablet Take 600 mg by mouth 2 (two) times daily with a meal.     . carvedilol (COREG) 3.125 MG tablet TAKE 1 TABLET (3.125 MG TOTAL) BY MOUTH 2 (TWO) TIMES DAILY WITH A MEAL. 180 tablet 3  . denosumab (PROLIA) 60 MG/ML SOLN injection Inject 60 mg into the skin every 6 (six) months. Administer in upper arm, thigh, or abdomen    . fenofibrate (TRICOR) 48 MG tablet Take 48 mg by mouth daily.    . ferrous sulfate 325 (65 FE) MG EC tablet Take 325 mg by mouth daily with breakfast.    . furosemide (LASIX) 20 MG tablet Take 40mg  every morning and an additional 20mg  every evening as needed for swelling 270 tablet 3  .  furosemide (LASIX) 40 MG tablet Take 40 mg by mouth. Take 40mg  every morning and an additional 20mg  every evening as needed for swelling    . gabapentin (NEURONTIN) 300 MG capsule TAKE 1 CAPSULE BY MOUTH AT BEDTIME 90 capsule 1  . glimepiride (AMARYL) 2 MG tablet Take 2 mg by mouth daily with breakfast.    . linagliptin (TRADJENTA) 5 MG TABS tablet Take 5 mg by mouth daily.    Marland Kitchen loratadine (CLARITIN) 10 MG tablet Take 10 mg by mouth every morning.     . Melatonin 5 MG TABS Take 5 mg by mouth at bedtime as needed (As needed for sleep).    . minoxidil (ROGAINE) 2 % external solution Apply 1 application topically 3 (three) times a week.     . Multiple Vitamin (MULTIVITAMIN WITH MINERALS) TABS tablet Take 1 tablet by mouth 2 (two) times daily.     Marland Kitchen omeprazole (PRILOSEC) 20 MG capsule Take 20 mg by mouth daily.     . potassium chloride (K-DUR,KLOR-CON) 10 MEQ tablet Take 2 tablets (20 mEq total) by mouth 2 (two) times daily. 120 tablet 3  . traMADol (ULTRAM) 50 MG tablet Take 50 mg by mouth every 6 (six) hours as needed for moderate pain.     . Vitamin D, Ergocalciferol, (DRISDOL) 50000 UNITS CAPS capsule Take 50,000 Units  by mouth every 14 (fourteen) days.     . vitamin E 100 UNIT capsule Take 100 Units by mouth daily.     No current facility-administered medications for this visit.     Functional Status:  In your present state of health, do you have any difficulty performing the following activities: 10/11/2016 08/13/2016  Hearing? N N  Vision? N N  Difficulty concentrating or making decisions? N N  Walking or climbing stairs? N N  Dressing or bathing? N N  Doing errands, shopping? N N  Preparing Food and eating ? N N  Using the Toilet? N N  In the past six months, have you accidently leaked urine? N N  Do you have problems with loss of bowel control? N N  Managing your Medications? N N  Managing your Finances? N N  Housekeeping or managing your Housekeeping? Y Y  Some recent data might  be hidden    Fall/Depression Screening: Fall Risk  10/11/2016 08/13/2016 07/11/2016  Falls in the past year? Yes Yes Yes  Number falls in past yr: 1 1 1   Injury with Fall? No No No  Risk for fall due to : History of fall(s);Impaired balance/gait;Impaired mobility History of fall(s);Impaired balance/gait History of fall(s);Impaired balance/gait  Risk for fall due to (comments): - - -  Follow up Falls evaluation completed;Education provided Falls evaluation completed;Education provided Falls evaluation completed;Education provided   PHQ 2/9 Scores 10/11/2016 08/13/2016 07/11/2016 05/22/2016 04/23/2016 03/06/2016 01/30/2016  PHQ - 2 Score 0 0 0 0 0 0 0   THN CM Care Plan Problem One     Most Recent Value  Care Plan Problem One  Knowledge deficit in self management of Congestive Heart Failure  Role Documenting the Problem One  [place] for Problem One  Active  THN Long Term Goal (31-90 days)  Patient will not have any readmissions to congestive Heart Failure within the next 90 days  THN Long Term Goal Start Date  10/11/16  Interventions for Problem One [place] reminded the patient ot keep appointments with PCP and Cardiologist. RN eminded patient the importance ofd taking medications as prescribed. RN will monitor patient monthly telephonic     Assessment: Patient is progressing well in the Congestive Heart Failure Patient will continue to benefit from Health Coach telephonic outreach for education and support for Congestive Heart Failure self management.   Plan:  Patient will follow up primary care for Overall Health Maintenance Patient  will continue to monitoring daily weight RN will assess diabetes status with next outreach in May  Dalia Jollie [place] [facility] [phone]

## 2016-10-15 DIAGNOSIS — M542 Cervicalgia: Secondary | ICD-10-CM | POA: Diagnosis not present

## 2016-10-15 DIAGNOSIS — N183 Chronic kidney disease, stage 3 (moderate): Secondary | ICD-10-CM | POA: Diagnosis not present

## 2016-10-15 DIAGNOSIS — C50911 Malignant neoplasm of unspecified site of right female breast: Secondary | ICD-10-CM | POA: Diagnosis not present

## 2016-10-15 DIAGNOSIS — Z7984 Long term (current) use of oral hypoglycemic drugs: Secondary | ICD-10-CM | POA: Diagnosis not present

## 2016-10-15 DIAGNOSIS — E1122 Type 2 diabetes mellitus with diabetic chronic kidney disease: Secondary | ICD-10-CM | POA: Diagnosis not present

## 2016-10-15 DIAGNOSIS — E782 Mixed hyperlipidemia: Secondary | ICD-10-CM | POA: Diagnosis not present

## 2016-10-15 DIAGNOSIS — I129 Hypertensive chronic kidney disease with stage 1 through stage 4 chronic kidney disease, or unspecified chronic kidney disease: Secondary | ICD-10-CM | POA: Diagnosis not present

## 2016-10-15 DIAGNOSIS — Z0000 Encounter for general adult medical examination without abnormal findings: Secondary | ICD-10-CM | POA: Diagnosis not present

## 2016-10-15 DIAGNOSIS — Z1389 Encounter for screening for other disorder: Secondary | ICD-10-CM | POA: Diagnosis not present

## 2016-10-15 DIAGNOSIS — E1142 Type 2 diabetes mellitus with diabetic polyneuropathy: Secondary | ICD-10-CM | POA: Diagnosis not present

## 2016-10-15 DIAGNOSIS — F5101 Primary insomnia: Secondary | ICD-10-CM | POA: Diagnosis not present

## 2016-10-15 DIAGNOSIS — J440 Chronic obstructive pulmonary disease with acute lower respiratory infection: Secondary | ICD-10-CM | POA: Diagnosis not present

## 2016-10-15 DIAGNOSIS — I700 Atherosclerosis of aorta: Secondary | ICD-10-CM | POA: Diagnosis not present

## 2016-11-11 ENCOUNTER — Other Ambulatory Visit: Payer: Self-pay | Admitting: *Deleted

## 2016-11-11 ENCOUNTER — Encounter: Payer: Self-pay | Admitting: *Deleted

## 2016-11-11 NOTE — Patient Outreach (Signed)
[facility]) Care Management  11/11/2016   Rebecca Hall 01/16/1932 [phone] RN Health Coach telephone call to patient.  Hipaa compliance verified. Per patient she is doing the best she ever  has. Patient states she is able to do things and have things to look forward to. Patient has not started a routine exercise program. Patient is in the green zone.  Patient has meeting her goals of care with her CHF. She is weighing and monitoring her dietary intake. She is adhering to her medications. This patient is also a diabetic. Patient A1C is 8.0. RN discussed with patient about the range the A1C should be. Patient has agreed to follow up outreach .    Current Medications:  Current Outpatient Prescriptions  Medication Sig Dispense Refill  . allopurinol (ZYLOPRIM) 100 MG tablet Take 100 mg by mouth daily with supper. Take with 300 mg tablet for a total of 400mg  daily    . amLODipine (NORVASC) 2.5 MG tablet Take 1 tablet (2.5 mg total) by mouth daily. 90 tablet 3  . anastrozole (ARIMIDEX) 1 MG tablet TAKE 1 TABLET BY MOUTH DAILY. 90 tablet 1  . aspirin 81 MG tablet Take 81 mg by mouth at bedtime.     Marland Kitchen atorvastatin (LIPITOR) 20 MG tablet Take 20 mg by mouth every morning.     . calcium carbonate (OS-CAL) 600 MG TABS tablet Take 600 mg by mouth 2 (two) times daily with a meal.     . carvedilol (COREG) 3.125 MG tablet TAKE 1 TABLET (3.125 MG TOTAL) BY MOUTH 2 (TWO) TIMES DAILY WITH A MEAL. 180 tablet 3  . denosumab (PROLIA) 60 MG/ML SOLN injection Inject 60 mg into the skin every 6 (six) months. Administer in upper arm, thigh, or abdomen    . fenofibrate (TRICOR) 48 MG tablet Take 48 mg by mouth daily.    . ferrous sulfate 325 (65 FE) MG EC tablet Take 325 mg by mouth daily with breakfast.    . furosemide (LASIX) 20 MG tablet Take 40mg  every morning and an additional 20mg  every evening as needed for swelling 270 tablet 3  . furosemide (LASIX) 40 MG tablet Take 40 mg by mouth. Take  40mg  every morning and an additional 20mg  every evening as needed for swelling    . gabapentin (NEURONTIN) 300 MG capsule TAKE 1 CAPSULE BY MOUTH AT BEDTIME 90 capsule 1  . glimepiride (AMARYL) 2 MG tablet Take 2 mg by mouth daily with breakfast.    . linagliptin (TRADJENTA) 5 MG TABS tablet Take 5 mg by mouth daily.    Marland Kitchen loratadine (CLARITIN) 10 MG tablet Take 10 mg by mouth every morning.     . Melatonin 5 MG TABS Take 5 mg by mouth at bedtime as needed (As needed for sleep).    . minoxidil (ROGAINE) 2 % external solution Apply 1 application topically 3 (three) times a week.     . Multiple Vitamin (MULTIVITAMIN WITH MINERALS) TABS tablet Take 1 tablet by mouth 2 (two) times daily.     Marland Kitchen omeprazole (PRILOSEC) 20 MG capsule Take 20 mg by mouth daily.     . potassium chloride (K-DUR,KLOR-CON) 10 MEQ tablet Take 2 tablets (20 mEq total) by mouth 2 (two) times daily. 120 tablet 3  . traMADol (ULTRAM) 50 MG tablet Take 50 mg by mouth every 6 (six) hours as needed for moderate pain.     . Vitamin D, Ergocalciferol, (DRISDOL) 50000 UNITS CAPS capsule Take 50,000 Units by  mouth every 14 (fourteen) days.     . vitamin E 100 UNIT capsule Take 100 Units by mouth daily.     No current facility-administered medications for this visit.     Functional Status:  In your present state of health, do you have any difficulty performing the following activities: 11/11/2016 10/11/2016  Hearing? N N  Vision? N N  Difficulty concentrating or making decisions? N N  Walking or climbing stairs? N N  Dressing or bathing? N N  Doing errands, shopping? N N  Preparing Food and eating ? N N  Using the Toilet? N N  In the past six months, have you accidently leaked urine? N N  Do you have problems with loss of bowel control? N N  Managing your Medications? N N  Managing your Finances? - N  Housekeeping or managing your Housekeeping? - Y  Some recent data might be hidden    Fall/Depression Screening: Fall Risk   11/11/2016 10/11/2016 08/13/2016  Falls in the past year? Yes Yes Yes  Number falls in past yr: 1 1 1   Injury with Fall? No No No  Risk for fall due to : History of fall(s);Impaired balance/gait;Impaired mobility History of fall(s);Impaired balance/gait;Impaired mobility History of fall(s);Impaired balance/gait  Risk for fall due to (comments): - - -  Follow up Falls evaluation completed;Education provided;Falls prevention discussed Falls evaluation completed;Education provided Falls evaluation completed;Education provided   [facility] 2/9 Scores 11/11/2016 10/11/2016 08/13/2016 07/11/2016 05/22/2016 04/23/2016 03/06/2016  PHQ - 2 Score 0 0 0 0 0 0 0   THN CM Care Plan Problem One     Most Recent Value  Care Plan Problem One  Knowledge deficit in self management of Congestive Heart Failure  Role Documenting the Problem One  Health Coach  THN Long Term Goal (31-90 days)  Patient will not have any readmissions to congestive Heart Failure within the next 90 days  THN Long Term Goal Start Date  11/11/16  Interventions for Problem One [place] reminded the patient ot keep appointments with PCP and Cardiologist. RN eminded patient the importance ofd taking medications as prescribed. RN will monitor patient monthly telephonic  THN CM Short Term Goal #1 (0-30 days)  Patient will report looking into some type of exercise routine  THN CM Short Term Goal #1 Start Date  11/11/16  Interventions for Short Term Goal #1  RN discussed with patient about exercise routine. RN discussed chair exercises  THN CM Short Term Goal #2 (0-30 days)  Knowledge Deficit isn     THN CM Care Plan Problem Two     Most Recent Value  Care Plan Problem Two  Knowledge deficit in self management of diabetes  Role Documenting the Problem Two  [place] for Problem Two  Active  THN CM Short Term Goal #1 (0-30 days)  Patient will have an A1C below 8.0 next blood draw  THN CM Short Term Goal #1 Start Date   11/11/16  Interventions for Short Term Goal #2   RN sent patient education material on what A1C means. RN sent patient educationa material on what the blood sugars should run to make A1C below 7  THN CM Short Term Goal #2 (0-30 days)  Patient will be able to verbalize the signs and symptoms of hypo and hyperglycemia   THN CM Short Term Goal #2 Start Date  11/11/16  Interventions for Short Term Goal #2  RN sent picture chart  of hypo and hyperglycemia faces. RN sent educational materials on diabetes      Assessment:   Patient is in the green zone Patient is weighing daily Patient is monitoring blood pressure Patient is adhering to medications   Plan:  RN discussed with patient about an exercise routine RN will open Diabetes program next outreach call RN sent educational blood living well with diabetes RN sent EMMI educational material on A1C RN sent picture chart on faces of hypo and hyperglycemia  [place] [facility] [phone]

## 2016-11-16 DIAGNOSIS — J209 Acute bronchitis, unspecified: Secondary | ICD-10-CM | POA: Diagnosis not present

## 2016-12-02 DIAGNOSIS — H1013 Acute atopic conjunctivitis, bilateral: Secondary | ICD-10-CM | POA: Diagnosis not present

## 2016-12-02 DIAGNOSIS — J309 Allergic rhinitis, unspecified: Secondary | ICD-10-CM | POA: Diagnosis not present

## 2016-12-12 ENCOUNTER — Other Ambulatory Visit: Payer: Self-pay | Admitting: *Deleted

## 2016-12-12 NOTE — Patient Outreach (Addendum)
[facility]) Care Management  12/12/2016  CATRENA VARI 01-Nov-1931 [id number]  RN Health Coach telephone call to patient.  Hipaa compliance verified. Per patient she is doing very good with her heart failure.  She is in the green zone. She is not having any swelling in her extremities or shortness of breath. Per patient she is having a lot of problem with her sinuses. She has been on 2 antibiotics and Flonase nasal spray. Per patient she called the Dr and has been on a Z pack. This patient is weighing and taking medications as per order. She is aware of the action plans and have met her goals of care for Congestive heart failure.    Addendum note: RN called physician office to discuss obtaining a meter for patient. Per [place] return call from physician office. The physician does not want the patient to have a meter. The 8.0 A1C is where he would like for her to be with her age and co morbidities.  RN will follow up with physician office if there are any health changes noted in outreach calls.  Current Medications:  Current Outpatient Prescriptions  Medication Sig Dispense Refill  . allopurinol (ZYLOPRIM) 100 MG tablet Take 100 mg by mouth daily with supper. Take with 300 mg tablet for a total of 478m daily    . amLODipine (NORVASC) 2.5 MG tablet Take 1 tablet (2.5 mg total) by mouth daily. 90 tablet 3  . anastrozole (ARIMIDEX) 1 MG tablet TAKE 1 TABLET BY MOUTH DAILY. 90 tablet 1  . aspirin 81 MG tablet Take 81 mg by mouth at bedtime.     .Marland Kitchenatorvastatin (LIPITOR) 20 MG tablet Take 20 mg by mouth every morning.     . calcium carbonate (OS-CAL) 600 MG TABS tablet Take 600 mg by mouth 2 (two) times daily with a meal.     . carvedilol (COREG) 3.125 MG tablet TAKE 1 TABLET (3.125 MG TOTAL) BY MOUTH 2 (TWO) TIMES DAILY WITH A MEAL. 180 tablet 3  . denosumab (PROLIA) 60 MG/ML SOLN injection Inject 60 mg into the skin every 6 (six) months. Administer in upper arm, thigh, or abdomen     . fenofibrate (TRICOR) 48 MG tablet Take 48 mg by mouth daily.    . ferrous sulfate 325 (65 FE) MG EC tablet Take 325 mg by mouth daily with breakfast.    . furosemide (LASIX) 20 MG tablet Take 450mevery morning and an additional 2075mvery evening as needed for swelling 270 tablet 3  . furosemide (LASIX) 40 MG tablet Take 40 mg by mouth. Take 21m36mery morning and an additional 20mg72mry evening as needed for swelling    . gabapentin (NEURONTIN) 300 MG capsule TAKE 1 CAPSULE BY MOUTH AT BEDTIME 90 capsule 1  . glimepiride (AMARYL) 2 MG tablet Take 2 mg by mouth daily with breakfast.    . linagliptin (TRADJENTA) 5 MG TABS tablet Take 5 mg by mouth daily.    . lorMarland Kitchentadine (CLARITIN) 10 MG tablet Take 10 mg by mouth every morning.     . Melatonin 5 MG TABS Take 5 mg by mouth at bedtime as needed (As needed for sleep).    . minoxidil (ROGAINE) 2 % external solution Apply 1 application topically 3 (three) times a week.     . Multiple Vitamin (MULTIVITAMIN WITH MINERALS) TABS tablet Take 1 tablet by mouth 2 (two) times daily.     . omeMarland Kitchenrazole (PRILOSEC) 20 MG capsule Take 20  mg by mouth daily.     . potassium chloride (K-DUR,KLOR-CON) 10 MEQ tablet Take 2 tablets (20 mEq total) by mouth 2 (two) times daily. 120 tablet 3  . traMADol (ULTRAM) 50 MG tablet Take 50 mg by mouth every 6 (six) hours as needed for moderate pain.     . Vitamin D, Ergocalciferol, (DRISDOL) 50000 UNITS CAPS capsule Take 50,000 Units by mouth every 14 (fourteen) days.     . vitamin E 100 UNIT capsule Take 100 Units by mouth daily.     No current facility-administered medications for this visit.     Functional Status:  In your present state of health, do you have any difficulty performing the following activities: 12/12/2016 11/11/2016  Hearing? N N  Vision? N N  Difficulty concentrating or making decisions? N N  Walking or climbing stairs? N N  Dressing or bathing? N N  Doing errands, shopping? N N  Preparing Food and  eating ? N N  Using the Toilet? N N  In the past six months, have you accidently leaked urine? N N  Do you have problems with loss of bowel control? N N  Managing your Medications? N N  Managing your Finances? N -  Housekeeping or managing your Housekeeping? Y -  Some recent data might be hidden    Fall/Depression Screening: Fall Risk  12/12/2016 11/11/2016 10/11/2016  Falls in the past year? - Yes Yes  Number falls in past yr: _0 Injury with Fall? No No No  Risk for fall due to : History of fall(s);Impaired balance/gait;Impaired mobility History of fall(s);Impaired balance/gait;Impaired mobility History of fall(s);Impaired balance/gait;Impaired mobility  Risk for fall due to (comments): - - -  Follow up Falls evaluation completed;Education provided;Falls prevention discussed Falls evaluation completed;Education provided;Falls prevention discussed Falls evaluation completed;Education provided   [facility] 2/9 Scores 12/12/2016 11/11/2016 10/11/2016 08/13/2016 07/11/2016 05/22/2016 04/23/2016  PHQ - 2 Score 0 0 0 0 0 0 0   THN CM Care Plan Problem One     Most Recent Value  Care Plan Problem One  Knowledge deficit in self management of Congestive Heart Failure  Role Documenting the Problem One  [place] for Problem One  Active  THN Long Term Goal   Patient will not have any readmissions to congestive Heart Failure within the next 90 days  THN Long Term Goal Met Date  12/12/16  Interventions for Problem One [place] reminded the patient ot keep appointments with PCP and Cardiologist. RN eminded patient the importance ofd taking medications as prescribed. RN will monitor patient monthly telephonic      [facility] CM Care Plan Problem One     Most Recent Value  Care Plan Problem One  Knowledge deficit in self management of diabetes  Role Documenting the Problem One  [place] for Problem One  Active  [facility] Long Term Goal   Patient will have a better  understanding of diabetes within the next 90 days  THN Long Term Goal Start Date  12/12/16  [facility] Long Term Goal Met Date  12/12/16  Interventions for Problem One Long Term Goal  RN sent patient education material on living well with diabetes. RN discussed with patient about  high and low blood sugars.   THN CM Short Term Goal #1   Patient will report understanding of what to do on sick days with Diabetes within the next 30 days  THN  CM Short Term Goal #1 Start Date  12/12/16  Interventions for Short Term Goal #1  RN sent EMMI educational material on what to do on sick days with diabetes. RN will follow up with discussion and teach back  THN CM Short Term Goal #2   RN discussed with patient about obtaining a meter to check blood sugars within the next 30 days  THN CM Short Term Goal #2 Start Date  12/13/16  [facility] CM Short Term Goal #2 Met Date  12/12/16  Interventions for Short Term Goal #2  RN discussed with patient about checking blood sugars. RN called physician office about obtaining a meter.   THN CM Short Term Goal #3  Patient will understand the importance of foot care witihn the next 30 days  THN CM Short Term Goal #3 Start Date  12/12/16  Interventions for Short Tern Goal #3  RN sent educational material on foot care. RN will follow up with teach back and discussion     Assessment #1:  Patient has met her goals of care for Congestive Heart Failure.  Plan #1:  RN will close program for congestive heart failure RN will open new program for diabetes education.   Assessment #2 Patient does not have a meter Patient does not check her blood sugar Patient does not know what to do on sick days Patient does not know signs and symptoms of hypo and hyperglycemia  Plan #2  RN sent patient educational material on diabetes RN called physician office about a meter [place] sent educational material on what to do on sick days.  RN Health Coach sent educational material on foot care RN  will follow up within the month of July with discussion and teach back  [place] [facility] [phone]

## 2016-12-13 ENCOUNTER — Encounter: Payer: Self-pay | Admitting: *Deleted

## 2016-12-29 ENCOUNTER — Other Ambulatory Visit: Payer: Self-pay | Admitting: Oncology

## 2016-12-29 DIAGNOSIS — C50411 Malignant neoplasm of upper-outer quadrant of right female breast: Secondary | ICD-10-CM

## 2016-12-31 DIAGNOSIS — Z7984 Long term (current) use of oral hypoglycemic drugs: Secondary | ICD-10-CM | POA: Diagnosis not present

## 2016-12-31 DIAGNOSIS — E1122 Type 2 diabetes mellitus with diabetic chronic kidney disease: Secondary | ICD-10-CM | POA: Diagnosis not present

## 2016-12-31 DIAGNOSIS — E1142 Type 2 diabetes mellitus with diabetic polyneuropathy: Secondary | ICD-10-CM | POA: Diagnosis not present

## 2016-12-31 DIAGNOSIS — N183 Chronic kidney disease, stage 3 (moderate): Secondary | ICD-10-CM | POA: Diagnosis not present

## 2016-12-31 DIAGNOSIS — I129 Hypertensive chronic kidney disease with stage 1 through stage 4 chronic kidney disease, or unspecified chronic kidney disease: Secondary | ICD-10-CM | POA: Diagnosis not present

## 2016-12-31 DIAGNOSIS — E782 Mixed hyperlipidemia: Secondary | ICD-10-CM | POA: Diagnosis not present

## 2016-12-31 DIAGNOSIS — C50911 Malignant neoplasm of unspecified site of right female breast: Secondary | ICD-10-CM | POA: Diagnosis not present

## 2016-12-31 DIAGNOSIS — D509 Iron deficiency anemia, unspecified: Secondary | ICD-10-CM | POA: Diagnosis not present

## 2016-12-31 DIAGNOSIS — D649 Anemia, unspecified: Secondary | ICD-10-CM | POA: Diagnosis not present

## 2016-12-31 DIAGNOSIS — J440 Chronic obstructive pulmonary disease with acute lower respiratory infection: Secondary | ICD-10-CM | POA: Diagnosis not present

## 2016-12-31 DIAGNOSIS — E1165 Type 2 diabetes mellitus with hyperglycemia: Secondary | ICD-10-CM | POA: Diagnosis not present

## 2017-01-02 ENCOUNTER — Ambulatory Visit (HOSPITAL_BASED_OUTPATIENT_CLINIC_OR_DEPARTMENT_OTHER)
Admission: RE | Admit: 2017-01-02 | Discharge: 2017-01-02 | Disposition: A | Discharge status: Home / Self Care | Payer: PPO | Source: Ambulatory Visit | Attending: Internal Medicine | Admitting: Internal Medicine

## 2017-01-02 ENCOUNTER — Ambulatory Visit (HOSPITAL_COMMUNITY)
Admission: RE | Admit: 2017-01-02 | Discharge: 2017-01-02 | Disposition: A | Discharge status: Home / Self Care | Payer: PPO | Source: Ambulatory Visit | Attending: Internal Medicine | Admitting: Internal Medicine

## 2017-01-02 ENCOUNTER — Encounter (HOSPITAL_COMMUNITY): Payer: Self-pay | Admitting: Internal Medicine

## 2017-01-02 VITALS — BP 136/64 | HR 66 | Wt 158.1 lb

## 2017-01-02 DIAGNOSIS — K589 Irritable bowel syndrome without diarrhea: Secondary | ICD-10-CM | POA: Insufficient documentation

## 2017-01-02 DIAGNOSIS — N179 Acute kidney failure, unspecified: Secondary | ICD-10-CM | POA: Diagnosis not present

## 2017-01-02 DIAGNOSIS — Z853 Personal history of malignant neoplasm of breast: Secondary | ICD-10-CM | POA: Insufficient documentation

## 2017-01-02 DIAGNOSIS — Z7982 Long term (current) use of aspirin: Secondary | ICD-10-CM | POA: Diagnosis not present

## 2017-01-02 DIAGNOSIS — I2510 Atherosclerotic heart disease of native coronary artery without angina pectoris: Secondary | ICD-10-CM | POA: Diagnosis not present

## 2017-01-02 DIAGNOSIS — E875 Hyperkalemia: Secondary | ICD-10-CM

## 2017-01-02 DIAGNOSIS — Z8249 Family history of ischemic heart disease and other diseases of the circulatory system: Secondary | ICD-10-CM | POA: Insufficient documentation

## 2017-01-02 DIAGNOSIS — E1122 Type 2 diabetes mellitus with diabetic chronic kidney disease: Secondary | ICD-10-CM | POA: Insufficient documentation

## 2017-01-02 DIAGNOSIS — K219 Gastro-esophageal reflux disease without esophagitis: Secondary | ICD-10-CM | POA: Diagnosis not present

## 2017-01-02 DIAGNOSIS — Z801 Family history of malignant neoplasm of trachea, bronchus and lung: Secondary | ICD-10-CM | POA: Insufficient documentation

## 2017-01-02 DIAGNOSIS — E785 Hyperlipidemia, unspecified: Secondary | ICD-10-CM | POA: Diagnosis not present

## 2017-01-02 DIAGNOSIS — N183 Chronic kidney disease, stage 3 (moderate): Secondary | ICD-10-CM | POA: Insufficient documentation

## 2017-01-02 DIAGNOSIS — M810 Age-related osteoporosis without current pathological fracture: Secondary | ICD-10-CM | POA: Diagnosis not present

## 2017-01-02 DIAGNOSIS — Z7984 Long term (current) use of oral hypoglycemic drugs: Secondary | ICD-10-CM | POA: Diagnosis not present

## 2017-01-02 DIAGNOSIS — I5022 Chronic systolic (congestive) heart failure: Secondary | ICD-10-CM | POA: Diagnosis not present

## 2017-01-02 DIAGNOSIS — I130 Hypertensive heart and chronic kidney disease with heart failure and stage 1 through stage 4 chronic kidney disease, or unspecified chronic kidney disease: Secondary | ICD-10-CM | POA: Diagnosis not present

## 2017-01-02 DIAGNOSIS — E1142 Type 2 diabetes mellitus with diabetic polyneuropathy: Secondary | ICD-10-CM | POA: Insufficient documentation

## 2017-01-02 DIAGNOSIS — I429 Cardiomyopathy, unspecified: Secondary | ICD-10-CM | POA: Diagnosis not present

## 2017-01-02 DIAGNOSIS — Z800 Family history of malignant neoplasm of digestive organs: Secondary | ICD-10-CM | POA: Diagnosis not present

## 2017-01-02 DIAGNOSIS — I5032 Chronic diastolic (congestive) heart failure: Secondary | ICD-10-CM | POA: Diagnosis not present

## 2017-01-02 LAB — BASIC METABOLIC PANEL
Anion gap: 11 (ref 5–15)
BUN: 22 mg/dL — ABNORMAL HIGH (ref 6–20)
CO2: 24 mmol/L (ref 22–32)
Calcium: 9.4 mg/dL (ref 8.9–10.3)
Chloride: 102 mmol/L (ref 101–111)
Creatinine, Ser: 1.58 mg/dL — ABNORMAL HIGH (ref 0.44–1.00)
GFR calc Af Amer: 34 mL/min — ABNORMAL LOW (ref >60)
GFR calc non Af Amer: 29 mL/min — ABNORMAL LOW (ref >60)
Glucose, Bld: 206 mg/dL — ABNORMAL HIGH (ref 65–99)
Potassium: 3.6 mmol/L (ref 3.5–5.1)
Sodium: 137 mmol/L (ref 135–145)

## 2017-01-02 MED ORDER — POTASSIUM CHLORIDE CRYS ER 10 MEQ PO TBCR: 20.0000 meq | EXTENDED_RELEASE_TABLET | Freq: Every day | ORAL | 3 refills | Status: DC

## 2017-01-02 MED ORDER — PERFLUTREN LIPID MICROSPHERE
1.0000 mL | INTRAVENOUS | Status: AC | PRN
  Administered 2017-01-02: 2 mL via INTRAVENOUS
  Filled 2017-01-02: qty 10

## 2017-01-02 NOTE — Addendum Note (Signed)
Encounter addended by: Darron Doom, RN on: 01/02/2017 12:34 PM<BR>    Actions taken: Visit diagnoses modified, Order list changed, Diagnosis association updated

## 2017-01-02 NOTE — Addendum Note (Signed)
Encounter addended by: Darron Doom, RN on: 01/02/2017 12:30 PM<BR>    Actions taken: Order list changed, Sign clinical note

## 2017-01-02 NOTE — Progress Notes (Signed)
Advanced Heart Failure Clinic Note   Patient ID: Rebecca Hall, female   DOB: [birth year], 83 y.o.   MRN: [id number] PCP: Dr Felipa Eth.  Primary HF: Dr Haroldine Laws Oncologist: Dr Jana Hakim  HPI: AERICA Hall is an 83 y.o. female with history of Breast Cancer treated with 3 doses only of herceptin in 2014, CKD, HTN, HLD, Osteoporosis, GERD, Hx of GI Bleed, and DM.  Admitted to [facility] 8/16 with hypovolemic shock, AKI creatinine 4.7. After fluid resuscitation developed pulmonary edema. Found to have new onset LV dysfunction. Had cath as noted below with 1v CAD with 60% proximal LAD stenosis and NICM. Discharged on lasix, carvedilol, and Entresto. Discharge weight was 156 pounds.   Admitted 06/30/15 with weakness and dizziness and found to have AKI, hyperkalemia, and hypotension. (Creatinine 3.69 and K 7.4 on admit) Pt had stopped spiro several days prior with dizziness. K treated in ED as well as inpatient setting. Received IV fluid for hypotension.    She returns HF follow up. Has been struggling with bronchitis for the pas month or so. Now getting better. Otherwise doing well. Remains on lasix 40/20. Has not had to take extra. Weight at home stable 155-158. Exertional dyspnea is stable. Can do all ADLs without too much difficulty. Can go to store if she needs.  No CP. Weight stable.   Echo today (viewed personally) EF 55-60% RV ok.   [facility] 01/27/2015  RA = 11 RV = 42/8/15 PA = 38/8 (24) PCW = 19 Fick cardiac output/index = 5.2/3.0 Thermo CO/CI = 5.7/3.2 PVR = 1.0 WU FA sat = 93% PA sat = 51%, 51% Ao Pressure: 133/60 (85) LV Pressure: 133/9/17 No aortic stenosis  Assessment: 1) Moderate 1v CAD with 60% proximal LAD lesion 2) Non-ischemic CM with EF 30-35% by echo 3) Mildly elevated R-sided pressures with normal cardiac output  Labs:   06/09/15 K 2.7, Creatinine 1.4 07/14/2015: K 4.2 Creatinine 2.35  07/31/2015: K 4.1 Creatinine 1.34  7/17: K 3.3, creatinine 1.52  ROS: All systems  negative except as listed in HPI, PMH and Problem List.  SH:  Social History   Social History  . Marital status: Married    Spouse name: N/A  . Number of children: N/A  . Years of education: N/A   Occupational History  . Not on file.   Social History Main Topics  . Smoking status: Never Smoker  . Smokeless tobacco: Never Used  . Alcohol use No  . Drug use: No  . Sexual activity: Not on file     Comment: first live birth age 89, P 2, Estrogen    Other Topics Concern  . Not on file   Social History Narrative  . No narrative on file    FH:  Family History  Problem Relation Age of Onset  . Cancer Maternal Aunt 80       colon cancer  . Cancer Maternal Uncle 65       lung  . Cancer Maternal Grandmother        pancreas  . Cerebral aneurysm Father     Past Medical History:  Diagnosis Date  . Arthritis   . Breast cancer ([place]) 03/15/13   right, 12 o'clock  . Chronic kidney disease   . Diabetes mellitus without complication ([place])   . GERD (gastroesophageal reflux disease)   . History of lower GI bleeding   . Hyperkalemia 12/21/2015  . Hyperlipidemia   . Hypertension   . IBS (irritable bowel syndrome)   .  Neuromuscular disorder ([place])    peripheral neuropathy feet  . Osteoporosis     Current Outpatient Prescriptions  Medication Sig Dispense Refill  . allopurinol (ZYLOPRIM) 300 MG tablet Take 300 mg by mouth daily.    Marland Kitchen amLODipine (NORVASC) 2.5 MG tablet Take 1 tablet (2.5 mg total) by mouth daily. 90 tablet 3  . anastrozole (ARIMIDEX) 1 MG tablet TAKE 1 TABLET BY MOUTH DAILY. 90 tablet 0  . aspirin 81 MG tablet Take 81 mg by mouth at bedtime.     Marland Kitchen atorvastatin (LIPITOR) 20 MG tablet Take 20 mg by mouth every morning.     . calcium carbonate (OS-CAL) 600 MG TABS tablet Take 600 mg by mouth 2 (two) times daily with a meal.     . carvedilol (COREG) 3.125 MG tablet TAKE 1 TABLET (3.125 MG TOTAL) BY MOUTH 2 (TWO) TIMES DAILY WITH A MEAL. 180 tablet 3  . denosumab  (PROLIA) 60 MG/ML SOLN injection Inject 60 mg into the skin every 6 (six) months. Administer in upper arm, thigh, or abdomen    . fenofibrate (TRICOR) 48 MG tablet Take 48 mg by mouth daily.    . ferrous sulfate 325 (65 FE) MG EC tablet Take 325 mg by mouth daily with breakfast.    . furosemide (LASIX) 20 MG tablet Take 40mg  every morning and an additional 20mg  every evening as needed for swelling 270 tablet 3  . gabapentin (NEURONTIN) 300 MG capsule TAKE 1 CAPSULE BY MOUTH AT BEDTIME 90 capsule 0  . glimepiride (AMARYL) 2 MG tablet Take 2 mg by mouth daily with breakfast.    . linagliptin (TRADJENTA) 5 MG TABS tablet Take 5 mg by mouth daily.    Marland Kitchen loratadine (CLARITIN) 10 MG tablet Take 10 mg by mouth every morning.     . Melatonin 5 MG TABS Take 5 mg by mouth at bedtime as needed (As needed for sleep).    . minoxidil (ROGAINE) 2 % external solution Apply 1 application topically 3 (three) times a week.     . Multiple Vitamin (MULTIVITAMIN WITH MINERALS) TABS tablet Take 1 tablet by mouth 2 (two) times daily.     Marland Kitchen omeprazole (PRILOSEC) 20 MG capsule Take 20 mg by mouth daily.     . potassium chloride (K-DUR,KLOR-CON) 10 MEQ tablet Take 2 tablets (20 mEq total) by mouth 2 (two) times daily. (Patient taking differently: Take 20 mEq by mouth daily. ) 120 tablet 3  . traMADol (ULTRAM) 50 MG tablet Take 50 mg by mouth every 6 (six) hours as needed for moderate pain.     . Vitamin D, Ergocalciferol, (DRISDOL) 50000 UNITS CAPS capsule Take 50,000 Units by mouth every 14 (fourteen) days.     . vitamin E 100 UNIT capsule Take 100 Units by mouth daily.     No current facility-administered medications for this encounter.    Facility-Administered Medications Ordered in Other Encounters  Medication Dose Route Frequency Provider Last Rate Last Dose  . perflutren lipid microspheres (DEFINITY) IV suspension  1-10 mL Intravenous PRN Stoneking, Hal, MD   2 mL at 01/02/17 1207    Vitals:   01/02/17 1210  BP:  136/64  Pulse: 66  SpO2: 96%  Weight: 158 lb 1.9 oz (71.7 kg)    Wt Readings from Last 3 Encounters:  01/02/17 158 lb 1.9 oz (71.7 kg)  06/28/16 161 lb 6.4 oz (73.2 kg)  04/11/16 161 lb 12.8 oz (73.4 kg)    PHYSICAL EXAM:  General:  Well appearing. No resp difficulty. Husband present HEENT: normal Neck: supple. no JVD. Carotids 2+ bilat; no bruits. No lymphadenopathy or thryomegaly appreciated. Cor: PMI nondisplaced. Regular rate & rhythm. No rubs, gallops or murmurs. Lungs: clear Abdomen: soft, nontender, nondistended. No hepatosplenomegaly. No bruits or masses. Good bowel sounds. Extremities: no cyanosis, clubbing, rash, edema Neuro: alert & orientedx3, cranial nerves grossly intact. moves all 4 extremities w/o difficulty. Affect pleasant   ASSESSMENT & PLAN: 1. Chronic diastolic CHF: She has history of nonischemic cardiomyopathy, but most recent echo in 12/16 showed recovery of LV systolic function, EF 08-13%.   - NYHA II, volume status stable on exam - Continue lasix 40/20 mg. OK to bump to 40/40 if needed for extra fluid. - Adjusting kcl based on what she takes in her diet. Will check BMET today - Continue carvedilol 3.125 mg twice a day.  - Off lisinopril.  Would not re-challenge given improvement in LV systolic function.  - Echo today reviewed personally. Normal LV and RV function ok  2. CKD: Stage 3.  Check BMET today. 3. Hx of Breast Cancer: Only received 3 doses of Herceptin in 2014. Stopped due to possible allergy. 4. DM2 5. HTN:  -Blood pressure well controlled. Continue current regimen. -Goal SBP 130-140.  6. HLD: Continue lipitor 20 mg daily 7. Heavy snoring: She declines sleep study.   8. CAD: Nonobstructive on prior cath. No CP. Continue lipitor 20 mg and ASA 81 mg daily.   RTC in 6 months.   Glori Bickers MD 01/02/2017

## 2017-01-02 NOTE — Patient Instructions (Signed)
Continue taking Potassium 20 mEq (2 Tablets) Once Daily  Follow up in 6 Months, we will contact you to schedule appointment.

## 2017-01-03 DIAGNOSIS — Z961 Presence of intraocular lens: Secondary | ICD-10-CM | POA: Diagnosis not present

## 2017-01-03 DIAGNOSIS — H43813 Vitreous degeneration, bilateral: Secondary | ICD-10-CM | POA: Diagnosis not present

## 2017-01-03 DIAGNOSIS — E119 Type 2 diabetes mellitus without complications: Secondary | ICD-10-CM | POA: Diagnosis not present

## 2017-01-03 DIAGNOSIS — H52203 Unspecified astigmatism, bilateral: Secondary | ICD-10-CM | POA: Diagnosis not present

## 2017-01-13 ENCOUNTER — Ambulatory Visit: Payer: Self-pay | Admitting: *Deleted

## 2017-01-20 ENCOUNTER — Other Ambulatory Visit: Payer: Self-pay | Admitting: *Deleted

## 2017-01-20 ENCOUNTER — Encounter: Payer: Self-pay | Admitting: *Deleted

## 2017-01-20 NOTE — Patient Outreach (Signed)
[facility]) Care Management  01/20/2017   Rebecca Hall 1932/02/14 [id number]   RN Health Coach telephone call to patient.  [employer] compliance verified. Per patient she is doing very good. Patient has went to her follow up appointment with cardiologist. She is maintaining her weight. She has become more active. Patient is weighing daily and recording. Patient is taking diuretics and is very knowledgeable with the zones and action plan. Patient voiced receiving information on diabetes.Patient  physician would like for her A1C  to be at 8.0 . He does not want her to have a glucose meter at this time. Patient has met her goals of care and RN discussed closing case at this time with patient in agreement. .   Current Medications:  Current Outpatient Prescriptions  Medication Sig Dispense Refill  . allopurinol (ZYLOPRIM) 300 MG tablet Take 300 mg by mouth daily.    Marland Kitchen amLODipine (NORVASC) 2.5 MG tablet Take 1 tablet (2.5 mg total) by mouth daily. 90 tablet 3  . anastrozole (ARIMIDEX) 1 MG tablet TAKE 1 TABLET BY MOUTH DAILY. 90 tablet 0  . aspirin 81 MG tablet Take 81 mg by mouth at bedtime.     Marland Kitchen atorvastatin (LIPITOR) 20 MG tablet Take 20 mg by mouth every morning.     . calcium carbonate (OS-CAL) 600 MG TABS tablet Take 600 mg by mouth 2 (two) times daily with a meal.     . carvedilol (COREG) 3.125 MG tablet TAKE 1 TABLET (3.125 MG TOTAL) BY MOUTH 2 (TWO) TIMES DAILY WITH A MEAL. 180 tablet 3  . denosumab (PROLIA) 60 MG/ML SOLN injection Inject 60 mg into the skin every 6 (six) months. Administer in upper arm, thigh, or abdomen    . fenofibrate (TRICOR) 48 MG tablet Take 48 mg by mouth daily.    . ferrous sulfate 325 (65 FE) MG EC tablet Take 325 mg by mouth daily with breakfast.    . furosemide (LASIX) 20 MG tablet Take 65m every morning and an additional 285mevery evening as needed for swelling 270 tablet 3  . gabapentin (NEURONTIN) 300 MG capsule TAKE 1 CAPSULE BY MOUTH AT  BEDTIME 90 capsule 0  . glimepiride (AMARYL) 2 MG tablet Take 2 mg by mouth daily with breakfast.    . linagliptin (TRADJENTA) 5 MG TABS tablet Take 5 mg by mouth daily.    . Marland Kitchenoratadine (CLARITIN) 10 MG tablet Take 10 mg by mouth every morning.     . Melatonin 5 MG TABS Take 5 mg by mouth at bedtime as needed (As needed for sleep).    . minoxidil (ROGAINE) 2 % external solution Apply 1 application topically 3 (three) times a week.     . Multiple Vitamin (MULTIVITAMIN WITH MINERALS) TABS tablet Take 1 tablet by mouth 2 (two) times daily.     . Marland Kitchenmeprazole (PRILOSEC) 20 MG capsule Take 20 mg by mouth daily.     . potassium chloride (K-DUR,KLOR-CON) 10 MEQ tablet Take 2 tablets (20 mEq total) by mouth daily. 60 tablet 3  . traMADol (ULTRAM) 50 MG tablet Take 50 mg by mouth every 6 (six) hours as needed for moderate pain.     . Vitamin D, Ergocalciferol, (DRISDOL) 50000 UNITS CAPS capsule Take 50,000 Units by mouth every 14 (fourteen) days.     . vitamin E 100 UNIT capsule Take 100 Units by mouth daily.     No current facility-administered medications for this visit.     Functional Status:  In your present state of health, do you have any difficulty performing the following activities: 12/12/2016 11/11/2016  Hearing? N N  Vision? N N  Difficulty concentrating or making decisions? N N  Walking or climbing stairs? N N  Dressing or bathing? N N  Doing errands, shopping? N N  Preparing Food and eating ? N N  Using the Toilet? N N  In the past six months, have you accidently leaked urine? N N  Do you have problems with loss of bowel control? N N  Managing your Medications? N N  Managing your Finances? N -  Housekeeping or managing your Housekeeping? Y -  Some recent data might be hidden    Fall/Depression Screening: Fall Risk  12/12/2016 11/11/2016 10/11/2016  Falls in the past year? - Yes Yes  Comment - - -  Number falls in past yr: 1 1 1   Injury with Fall? No No No  Risk for fall due to :  History of fall(s);Impaired balance/gait;Impaired mobility History of fall(s);Impaired balance/gait;Impaired mobility History of fall(s);Impaired balance/gait;Impaired mobility  Risk for fall due to: Comment - - -  Follow up Falls evaluation completed;Education provided;Falls prevention discussed Falls evaluation completed;Education provided;Falls prevention discussed Falls evaluation completed;Education provided   [facility] 2/9 Scores 12/12/2016 11/11/2016 10/11/2016 08/13/2016 07/11/2016 05/22/2016 04/23/2016  PHQ - 2 Score 0 0 0 0 0 0 0   [facility] CM Care Plan Problem One     Most Recent Value  Care Plan Problem One  Knowledge deficit in self management of diabetes  Role Documenting the Problem One  [place] for Problem One  Active  THN Long Term Goal   Patient will have a better understanding of diabetes within the next 90 days    [facility] CM Care Plan Problem Two     Most Recent Value  Care Plan Problem Two  Knowledge deficit in self management of diabetes  Role Documenting the Problem Two  Health Coach  THN CM Short Term Goal #1   Patient will have an A1C below 8.0 next blood draw  [facility] CM Short Term Goal #1 Met Date   01/20/17  Interventions for Short Term Goal #2   RN sent patient education material on what A1C means. RN sent patient educationa material on what the blood sugars should run to make A1C below 7  [facility] CM Short Term Goal #2   Patient will be able to verbalize the signs and symptoms of hypo and hyperglycemia   [facility] CM Short Term Goal #2 Met Date  01/20/17  Interventions for Short Term Goal #2  RN sent picture chart of hypo and hyperglycemia faces. RN sent educational materials on diabetes     Assessment:   Patient knows zones and action plan  Patient received information on diabetes Patient is taking medication as prescrbed Patient has met goals of care  Plan: Case closure Letter sent to patient goals of care met Letter sent to Dr goals of care met  [place] [facility] [phone]

## 2017-02-12 DIAGNOSIS — E1122 Type 2 diabetes mellitus with diabetic chronic kidney disease: Secondary | ICD-10-CM | POA: Diagnosis not present

## 2017-02-12 DIAGNOSIS — Z6830 Body mass index (BMI) 30.0-30.9, adult: Secondary | ICD-10-CM | POA: Diagnosis not present

## 2017-02-12 DIAGNOSIS — E1142 Type 2 diabetes mellitus with diabetic polyneuropathy: Secondary | ICD-10-CM | POA: Diagnosis not present

## 2017-02-12 DIAGNOSIS — N183 Chronic kidney disease, stage 3 (moderate): Secondary | ICD-10-CM | POA: Diagnosis not present

## 2017-02-12 DIAGNOSIS — I129 Hypertensive chronic kidney disease with stage 1 through stage 4 chronic kidney disease, or unspecified chronic kidney disease: Secondary | ICD-10-CM | POA: Diagnosis not present

## 2017-02-12 DIAGNOSIS — M109 Gout, unspecified: Secondary | ICD-10-CM | POA: Diagnosis not present

## 2017-02-12 DIAGNOSIS — E669 Obesity, unspecified: Secondary | ICD-10-CM | POA: Diagnosis not present

## 2017-02-17 ENCOUNTER — Other Ambulatory Visit (HOSPITAL_COMMUNITY): Payer: Self-pay | Admitting: Internal Medicine

## 2017-02-17 DIAGNOSIS — I5022 Chronic systolic (congestive) heart failure: Secondary | ICD-10-CM

## 2017-03-16 ENCOUNTER — Other Ambulatory Visit: Payer: Self-pay | Admitting: Oncology

## 2017-03-16 DIAGNOSIS — C50411 Malignant neoplasm of upper-outer quadrant of right female breast: Secondary | ICD-10-CM

## 2017-03-17 ENCOUNTER — Other Ambulatory Visit: Payer: Self-pay | Admitting: Oncology

## 2017-03-17 DIAGNOSIS — Z9889 Other specified postprocedural states: Secondary | ICD-10-CM

## 2017-03-18 DIAGNOSIS — L821 Other seborrheic keratosis: Secondary | ICD-10-CM | POA: Diagnosis not present

## 2017-03-18 DIAGNOSIS — Z85828 Personal history of other malignant neoplasm of skin: Secondary | ICD-10-CM | POA: Diagnosis not present

## 2017-03-18 DIAGNOSIS — Z23 Encounter for immunization: Secondary | ICD-10-CM | POA: Diagnosis not present

## 2017-03-18 DIAGNOSIS — Z808 Family history of malignant neoplasm of other organs or systems: Secondary | ICD-10-CM | POA: Diagnosis not present

## 2017-03-18 DIAGNOSIS — L219 Seborrheic dermatitis, unspecified: Secondary | ICD-10-CM | POA: Diagnosis not present

## 2017-03-18 DIAGNOSIS — L570 Actinic keratosis: Secondary | ICD-10-CM | POA: Diagnosis not present

## 2017-03-20 DIAGNOSIS — Z23 Encounter for immunization: Secondary | ICD-10-CM | POA: Diagnosis not present

## 2017-05-05 ENCOUNTER — Ambulatory Visit
Admission: RE | Admit: 2017-05-05 | Discharge: 2017-05-05 | Disposition: A | Discharge status: Home / Self Care | Payer: PPO | Source: Ambulatory Visit | Attending: Oncology | Admitting: Oncology

## 2017-05-05 ENCOUNTER — Other Ambulatory Visit: Payer: Self-pay | Admitting: Oncology

## 2017-05-05 DIAGNOSIS — Z9889 Other specified postprocedural states: Secondary | ICD-10-CM

## 2017-05-05 DIAGNOSIS — N6310 Unspecified lump in the right breast, unspecified quadrant: Secondary | ICD-10-CM

## 2017-05-05 DIAGNOSIS — N6001 Solitary cyst of right breast: Secondary | ICD-10-CM | POA: Diagnosis not present

## 2017-05-05 DIAGNOSIS — R928 Other abnormal and inconclusive findings on diagnostic imaging of breast: Secondary | ICD-10-CM | POA: Diagnosis not present

## 2017-05-06 NOTE — Progress Notes (Signed)
ID: [name] OB: Dec 14, 1931  MR#: [id number]  CSN#:[id number]  PCP: [name], MD GYN:   SU: Rolm Bookbinder OTHER MD: Floyde Parkins, Earle Gell, Daniel Bensimhon  CHIEF COMPLAINT:  Estrogen receptor positive Breast Cancer  CURRENT TREATMENT: Anastrozole, denosumab/Prolia   HISTORY OF PRESENT ILLNESS: From the original intake note:  Rebecca Hall had routine screening mammography at [facility] 03/04/2013 showing a possible mass in the right breast, measuring 4 mm in diameter. Additional views 03/10/2013 confirmed an area of asymmetry in the right breast which was hypoechoic by ultrasonography. Biopsy of this area 03/15/2013 showed ([id number]) and invasive ductal carcinoma, grade 1 or 2, estrogen receptor 100% positive, progesterone receptor 100% positive, with a proliferation marker of 30% and HER-2 amplification by CISH with a ratio of 3.51 and an average HER-2 copy number of 6.85.  On 04/05/2013 the patient underwent bilateral breast MRIs. This showed in the right breast an 8 mm mass at the site of the known biopsy site, with surrounding known masslike enhancement. The entire area of concern measured 1.9 cm. There were no abnormal appearing lymph nodes of concern. In the left breast there were 2 areas of clumped non-masslike enhancement measuring 0.5 cm and 2.4 cm.  The patient's subsequent history is as detailed below  INTERVAL HISTORY: [name] returns today for follow-up and treatment of her estrogen receptor positive breast cancer, accompanied by her husband. She continues on anastrozole, with good tolerance. She reports having hot flashes, which are more manageable due to taking Gabapentin. She denies vaginal wetness.   She is also on denosumab/Prolia every 6 months, with a dose due today  Since her last visit to the office, [name] underwent diagnostic mammography with CAD and tomography and right breast ultrasonography on 05/05/2017  at [place] that showed: Breast density  category B. There was probable benign findings.   REVIEW OF SYSTEMS: Shameria reports she is doing well overall. She is worried about her breast cancer continually growing inside of her and how her prognosis will turn out. She would like to have a biopsy to reassure her that the cancer is being treated. For exercise, she tries to walk when she goes to the store. She notes that she doesn't have a good place to walk outside of her house. When she can, she does silver sneakers at the gym. She notes [facility]. Unfortunately she reports that she hasn't been able to exercise much lately since having to care for her mother. She denies unusual headaches, visual changes, nausea, vomiting, or dizziness. There has been no unusual cough, phlegm production, or pleurisy. This been no change in bowel or bladder habits. She denies unexplained fatigue or unexplained weight loss, bleeding, rash, or fever. A detailed review of systems was otherwise stable.    PAST MEDICAL HISTORY: Past Medical History:  Diagnosis Date  . Arthritis   . Breast cancer ([place]) 03/15/13   right, 12 o'clock  . Chronic kidney disease   . Diabetes mellitus without complication ([place])   . GERD (gastroesophageal reflux disease)   . History of lower GI bleeding   . Hyperkalemia 12/21/2015  . Hyperlipidemia   . Hypertension   . IBS (irritable bowel syndrome)   . Neuromuscular disorder ([place])    peripheral neuropathy feet  . Osteoporosis     PAST SURGICAL HISTORY: Past Surgical History:  Procedure Laterality Date  . ABDOMINAL HYSTERECTOMY  1975   No salpingo-oophorectomy  .  BREAST LUMPECTOMY WITH NEEDLE LOCALIZATION AND AXILLARY SENTINEL LYMPH NODE BX Right 04/28/2013   Performed by Rolm Bookbinder, MD at [facility]  . CATARACT EXTRACTION  2009   both  . COLONOSCOPY    . Right/Left Heart Cath and Coronary Angiography N/A 01/26/2015   Performed by Jolaine Artist, MD  at [place] [facility]  . SKIN CANCER EXCISION     head, face, hand ..multiple years  . TONSILLECTOMY      FAMILY HISTORY Family History  Problem Relation Age of Onset  . Cancer Maternal Aunt 80       colon cancer  . Cancer Maternal Uncle 65       lung  . Cancer Maternal Grandmother        pancreas  . Cerebral aneurysm Father    the patient's father died at the age of 46 from a subarachnoid hemorrhage. The patient's mother lived to be and 52.66 years old. She lived independently to be and. The patient had one brother, no sisters. There is a history of colon cancer or in a maternal aunts, diagnosed at age 72, and lung cancer in a maternal uncle diagnosed at age 79. There is no history of breast or ovarian cancer in the family  GYNECOLOGIC HISTORY:  (updated December 2000 410)  [name] can't remember when she had her first period. First live birth came at age 83 and she is [place] 2. She she took hormone replacement, namely estrogen, until 2014 when she was diagnosed with breast cancer.  SOCIAL HISTORY:  (Updated December 2014. 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 Her husband [name] is retired, he is largely disabled secondary to back problems following a fall. Daughter Derica Leiber is a retired Pharmacist, hospital living in [place]. [name] older son runs a Engineer, civil (consulting) and business in [place] and [name] helps out with that. The patient's son [name] lives in [place]. He is an Chief Financial Officer. Altogether Samuel has 4 grandsons, 3 great-grandsons and one great-granddaughter    ADVANCED DIRECTIVES: In place   HEALTH MAINTENANCE:  (Updated January 2015) Social History   Tobacco Use  . Smoking status: Never Smoker  . Smokeless tobacco: Never Used  Substance Use Topics  . Alcohol use: No  . Drug use: No     Colonoscopy: 2011  PAP: Status post hysterectomy  Bone density:Dec 2014, [place], osteopenia with T score of -1.9  Lipid panel: Not on  file/Dr. Maxwell Caul   Allergies  Allergen Reactions  . Levaquin [Levofloxacin In D5w] Anaphylaxis and Nausea Only    Shaking real bad on medicaton  . Codeine Nausea And Vomiting    Pass out  . Herceptin [Trastuzumab] Rash    Patient has developed extensive body skin rash following each Herceptin treatment of increased presence following each treatment  that cleared with oral steroids  . Sulfa Antibiotics Swelling  . Tape Rash    Blisters     Current Outpatient Medications  Medication Sig Dispense Refill  . allopurinol (ZYLOPRIM) 300 MG tablet Take 300 mg by mouth daily.    Marland Kitchen amLODipine (NORVASC) 2.5 MG tablet Take 1 tablet (2.5 mg total) by mouth daily. 90 tablet 3  . anastrozole (ARIMIDEX) 1 MG tablet TAKE 1 TABLET BY MOUTH DAILY 90 tablet 0  . aspirin 81 MG tablet Take 81 mg by mouth at bedtime.     Marland Kitchen atorvastatin (LIPITOR) 20 MG tablet Take 20 mg by mouth every  morning.     . calcium carbonate (OS-CAL) 600 MG TABS tablet Take 600 mg by mouth 2 (two) times daily with a meal.     . carvedilol (COREG) 3.125 MG tablet TAKE 1 TABLET (3.125 MG TOTAL) BY MOUTH 2 (TWO) TIMES DAILY WITH A MEAL. 180 tablet 2  . denosumab (PROLIA) 60 MG/ML SOLN injection Inject 60 mg into the skin every 6 (six) months. Administer in upper arm, thigh, or abdomen    . fenofibrate (TRICOR) 48 MG tablet Take 48 mg by mouth daily.    . ferrous sulfate 325 (65 FE) MG EC tablet Take 325 mg by mouth daily with breakfast.    . furosemide (LASIX) 20 MG tablet Take 32m every morning and an additional 26mevery evening as needed for swelling 270 tablet 3  . gabapentin (NEURONTIN) 300 MG capsule TAKE 1 CAPSULE BY MOUTH AT BEDTIME 90 capsule 0  . glimepiride (AMARYL) 2 MG tablet Take 2 mg by mouth daily with breakfast.    . linagliptin (TRADJENTA) 5 MG TABS tablet Take 5 mg by mouth daily.    . Marland Kitchenoratadine (CLARITIN) 10 MG tablet Take 10 mg by mouth every morning.     . Melatonin 5 MG TABS Take 5 mg by mouth at bedtime as  needed (As needed for sleep).    . minoxidil (ROGAINE) 2 % external solution Apply 1 application topically 3 (three) times a week.     . Multiple Vitamin (MULTIVITAMIN WITH MINERALS) TABS tablet Take 1 tablet by mouth 2 (two) times daily.     . Marland Kitchenmeprazole (PRILOSEC) 20 MG capsule Take 20 mg by mouth daily.     . potassium chloride (K-DUR,KLOR-CON) 10 MEQ tablet Take 2 tablets (20 mEq total) by mouth daily. 60 tablet 3  . traMADol (ULTRAM) 50 MG tablet Take 50 mg by mouth every 6 (six) hours as needed for moderate pain.     . Vitamin D, Ergocalciferol, (DRISDOL) 50000 UNITS CAPS capsule Take 50,000 Units by mouth every 14 (fourteen) days.     . vitamin E 100 UNIT capsule Take 100 Units by mouth daily.     No current facility-administered medications for this visit.     OBJECTIVE: Older white woman who appears well  Vitals:   05/12/17 1424  BP: 138/66  Pulse: 68  Resp: 20  Temp: 97.8 F (36.6 C)  SpO2: 95%     Body mass index is 30.67 kg/m.    ECOG FS:2 - Symptomatic, <50% confined to bed Filed Weights   05/12/17 1424  Weight: 162 lb 4.8 oz (73.6 kg)     Sclerae unicteric, EOMs intact Oropharynx clear and moist No cervical or supraclavicular adenopathy Lungs no rales or rhonchi Heart regular rate and rhythm Abd soft, nontender, positive bowel sounds MSK no focal spinal tenderness, no upper extremity lymphedema Neuro: nonfocal, well oriented, appropriate affect Breasts: The right breast is status post lumpectomy.  I do not palpate any unusual masses or any difference from last time to my best recollection.  Left breast is benign.  Both axillae are benign  Breasts: The right breast is status post lumpectomy. There is no evidence of disease recurrence. The right axilla is benign. The left breast is unremarkable.    LAB RESULTS:   Lab Results  Component Value Date   WBC 4.0 05/12/2017   NEUTROABS 2.1 05/12/2017   HGB 11.9 05/12/2017   HCT 35.5 05/12/2017   MCV 99.1  05/12/2017   PLT 167 05/12/2017  Chemistry      Component Value Date/Time   NA 136 05/12/2017 1350   K 3.9 05/12/2017 1350   CL 102 01/02/2017 1233   CO2 27 05/12/2017 1350   BUN 29.2 (H) 05/12/2017 1350   CREATININE 1.7 (H) 05/12/2017 1350      Component Value Date/Time   CALCIUM 10.2 05/12/2017 1350   ALKPHOS 48 05/12/2017 1350   AST 28 05/12/2017 1350   ALT 37 05/12/2017 1350   BILITOT 0.46 05/12/2017 1350       STUDIES: Since her last visit to the office, [name] underwent diagnostic mammography with CAD and tomography and right breast ultrasonography on 05/05/2017  at [place] that showed: Breast density category B. There was probable benign findings.  ASSESSMENT: 83 y.o. [place] woman, status post right breast upper-outer quadrant biopsy 03/15/2013 for a clinical T1c N0, stage IA invasive ductal carcinoma, grade 1 or 2, estrogen receptor 100% positive, progesterone receptor 100% positive, with an MIB-1 of 30%, and HER-2 amplified by CISH, with a ratio of 3.51 and an average HER-2 copy number Damita Lack of 6.85  (1) biopsy of 2 suspicious areas in the left breast both showed no evidence of malignancy  (2) right lumpectomy and sentinel lymph node sampling 04/28/2013 showed a pT1b pN0, stage IA invasive ductal carcinoma, grade 2, with negative margins  (a) did not need radiation therapy  (3) trastuzumab started 05/31/2013, given every 3 weeks for total of 9 doses; held as of 07/16/2013 after 3 doses because of possible allergic dermatitis associated with the drug.   (4) anastrozole started 05/08/2013  (a) dexa scan 06/10/2013 showed osteopenia with a T score of -2.1  (b) zolendronate started 09/13/2013, repeated 04/17/2015  (c) switched to denosumab/Prolia as of April 2017  (5) congestive heart failure in the setting of sepsis August 2016, with normal repeat echo December 2016   PLAN: Kaymarie is now 4 years out from definitive surgery for breast cancer with  no evidence of disease recurrence.  This is very favorable.  She continues on anastrozole, with good tolerance.  The plan is to continue that for another year at which point we will likely discontinue medication and will consider "graduating".  However she more likely will be interested in our survivorship program  She was very concerned about the small complex cyst found in her most recent mammogram.  We discussed that at length today.  We consider proceeding directly to biopsy.  After much discussion though we decided to simply repeat the studies in 6 months as suggested by radiology  She knows to call for any other issues that may develop before her next visit. Jana Hakim, Virgie Dad, MD  05/12/17 2:42 PM Medical Oncology and Hematology [facility] [address] [place], [place] [id number] Tel. [phone]    Fax. [phone]  This document serves as a record of services personally performed by Lurline Del, MD. It was created on his behalf by Sheron Nightingale, a trained medical scribe. The creation of this record is based on the scribe's personal observations and the provider's statements to them.   I have reviewed the above documentation for accuracy and completeness, and I agree with the above.

## 2017-05-09 ENCOUNTER — Other Ambulatory Visit: Payer: Self-pay

## 2017-05-09 DIAGNOSIS — C50411 Malignant neoplasm of upper-outer quadrant of right female breast: Secondary | ICD-10-CM

## 2017-05-12 ENCOUNTER — Ambulatory Visit (HOSPITAL_BASED_OUTPATIENT_CLINIC_OR_DEPARTMENT_OTHER): Payer: PPO | Admitting: Oncology

## 2017-05-12 ENCOUNTER — Ambulatory Visit: Payer: PPO

## 2017-05-12 ENCOUNTER — Other Ambulatory Visit (HOSPITAL_BASED_OUTPATIENT_CLINIC_OR_DEPARTMENT_OTHER): Payer: PPO

## 2017-05-12 ENCOUNTER — Encounter (HOSPITAL_BASED_OUTPATIENT_CLINIC_OR_DEPARTMENT_OTHER): Payer: PPO

## 2017-05-12 ENCOUNTER — Telehealth: Payer: Self-pay | Admitting: Oncology

## 2017-05-12 VITALS — BP 138/66 | HR 68 | Temp 97.8°F | Resp 20 | Ht 61.0 in | Wt 162.3 lb

## 2017-05-12 DIAGNOSIS — M8580 Other specified disorders of bone density and structure, unspecified site: Secondary | ICD-10-CM

## 2017-05-12 DIAGNOSIS — C50411 Malignant neoplasm of upper-outer quadrant of right female breast: Secondary | ICD-10-CM

## 2017-05-12 DIAGNOSIS — Z79899 Other long term (current) drug therapy: Secondary | ICD-10-CM

## 2017-05-12 DIAGNOSIS — M810 Age-related osteoporosis without current pathological fracture: Secondary | ICD-10-CM

## 2017-05-12 DIAGNOSIS — Z170 Estrogen receptor positive status [ER+]: Secondary | ICD-10-CM | POA: Diagnosis not present

## 2017-05-12 DIAGNOSIS — M85811 Other specified disorders of bone density and structure, right shoulder: Secondary | ICD-10-CM

## 2017-05-12 LAB — CBC WITH DIFFERENTIAL/PLATELET
BASO%: 0.6 % (ref 0.0–2.0)
Basophils Absolute: 0 10*3/uL (ref 0.0–0.1)
EOS%: 2.9 % (ref 0.0–7.0)
Eosinophils Absolute: 0.1 10*3/uL (ref 0.0–0.5)
HCT: 35.5 % (ref 34.8–46.6)
HGB: 11.9 g/dL (ref 11.6–15.9)
LYMPH%: 33.8 % (ref 14.0–49.7)
MCH: 33.3 pg (ref 25.1–34.0)
MCHC: 33.6 g/dL (ref 31.5–36.0)
MCV: 99.1 fL (ref 79.5–101.0)
MONO#: 0.4 10*3/uL (ref 0.1–0.9)
MONO%: 9.2 % (ref 0.0–14.0)
NEUT#: 2.1 10*3/uL (ref 1.5–6.5)
NEUT%: 53.5 % (ref 38.4–76.8)
Platelets: 167 10*3/uL (ref 145–400)
RBC: 3.58 10*6/uL — ABNORMAL LOW (ref 3.70–5.45)
RDW: 13.4 % (ref 11.2–14.5)
WBC: 4 10*3/uL (ref 3.9–10.3)
lymph#: 1.3 10*3/uL (ref 0.9–3.3)

## 2017-05-12 LAB — COMPREHENSIVE METABOLIC PANEL
ALT: 37 U/L (ref 0–55)
AST: 28 U/L (ref 5–34)
Albumin: 4 g/dL (ref 3.5–5.0)
Alkaline Phosphatase: 48 U/L (ref 40–150)
Anion Gap: 12 mEq/L — ABNORMAL HIGH (ref 3–11)
BUN: 29.2 mg/dL — ABNORMAL HIGH (ref 7.0–26.0)
CO2: 27 mEq/L (ref 22–29)
Calcium: 10.2 mg/dL (ref 8.4–10.4)
Chloride: 98 mEq/L (ref 98–109)
Creatinine: 1.7 mg/dL — ABNORMAL HIGH (ref 0.6–1.1)
EGFR: 28 mL/min/{1.73_m2} — ABNORMAL LOW (ref >60)
Glucose: 167 mg/dl — ABNORMAL HIGH (ref 70–140)
Potassium: 3.9 mEq/L (ref 3.5–5.1)
Sodium: 136 mEq/L (ref 136–145)
Total Bilirubin: 0.46 mg/dL (ref 0.20–1.20)
Total Protein: 7.3 g/dL (ref 6.4–8.3)

## 2017-05-12 MED ORDER — DENOSUMAB 60 MG/ML ~~LOC~~ SOLN
60.0000 mg | Freq: Once | SUBCUTANEOUS | Status: AC
  Administered 2017-05-12: 60 mg via SUBCUTANEOUS
  Filled 2017-05-12: qty 1

## 2017-05-12 NOTE — Telephone Encounter (Signed)
Gave patient avs report and appointments for May and November

## 2017-05-12 NOTE — Patient Instructions (Signed)

## 2017-06-05 DIAGNOSIS — N183 Chronic kidney disease, stage 3 (moderate): Secondary | ICD-10-CM | POA: Diagnosis not present

## 2017-06-05 DIAGNOSIS — Z7984 Long term (current) use of oral hypoglycemic drugs: Secondary | ICD-10-CM | POA: Diagnosis not present

## 2017-06-05 DIAGNOSIS — E669 Obesity, unspecified: Secondary | ICD-10-CM | POA: Diagnosis not present

## 2017-06-05 DIAGNOSIS — Z6830 Body mass index (BMI) 30.0-30.9, adult: Secondary | ICD-10-CM | POA: Diagnosis not present

## 2017-06-05 DIAGNOSIS — I129 Hypertensive chronic kidney disease with stage 1 through stage 4 chronic kidney disease, or unspecified chronic kidney disease: Secondary | ICD-10-CM | POA: Diagnosis not present

## 2017-06-05 DIAGNOSIS — Z79899 Other long term (current) drug therapy: Secondary | ICD-10-CM | POA: Diagnosis not present

## 2017-06-05 DIAGNOSIS — E1142 Type 2 diabetes mellitus with diabetic polyneuropathy: Secondary | ICD-10-CM | POA: Diagnosis not present

## 2017-06-05 DIAGNOSIS — E1122 Type 2 diabetes mellitus with diabetic chronic kidney disease: Secondary | ICD-10-CM | POA: Diagnosis not present

## 2017-06-22 ENCOUNTER — Other Ambulatory Visit: Payer: Self-pay | Admitting: Oncology

## 2017-06-22 DIAGNOSIS — C50411 Malignant neoplasm of upper-outer quadrant of right female breast: Secondary | ICD-10-CM

## 2017-06-23 ENCOUNTER — Other Ambulatory Visit (HOSPITAL_COMMUNITY): Payer: Self-pay | Admitting: Internal Medicine

## 2017-07-02 DIAGNOSIS — Z79899 Other long term (current) drug therapy: Secondary | ICD-10-CM | POA: Diagnosis not present

## 2017-07-06 ENCOUNTER — Other Ambulatory Visit (HOSPITAL_COMMUNITY): Payer: Self-pay | Admitting: Internal Medicine

## 2017-09-02 DIAGNOSIS — M545 Low back pain: Secondary | ICD-10-CM | POA: Diagnosis not present

## 2017-09-02 DIAGNOSIS — I129 Hypertensive chronic kidney disease with stage 1 through stage 4 chronic kidney disease, or unspecified chronic kidney disease: Secondary | ICD-10-CM | POA: Diagnosis not present

## 2017-09-02 DIAGNOSIS — E1122 Type 2 diabetes mellitus with diabetic chronic kidney disease: Secondary | ICD-10-CM | POA: Diagnosis not present

## 2017-09-02 DIAGNOSIS — Z7984 Long term (current) use of oral hypoglycemic drugs: Secondary | ICD-10-CM | POA: Diagnosis not present

## 2017-09-02 DIAGNOSIS — N183 Chronic kidney disease, stage 3 (moderate): Secondary | ICD-10-CM | POA: Diagnosis not present

## 2017-09-21 ENCOUNTER — Other Ambulatory Visit: Payer: Self-pay | Admitting: Oncology

## 2017-09-21 DIAGNOSIS — C50411 Malignant neoplasm of upper-outer quadrant of right female breast: Secondary | ICD-10-CM

## 2017-09-26 IMAGING — CR DG SHOULDER 1V*R*
1 series · 1 of 1 positions shown · non-contrast
Comparison: Right humerus series of today's date

CLINICAL DATA: Right shoulder and arm pain, history of breast
malignancy, evaluate for metastatic disease. Next item right humerus
series of today's date

EXAM:
RIGHT SHOULDER - 1 VIEW

[w shoulder ap internal right]
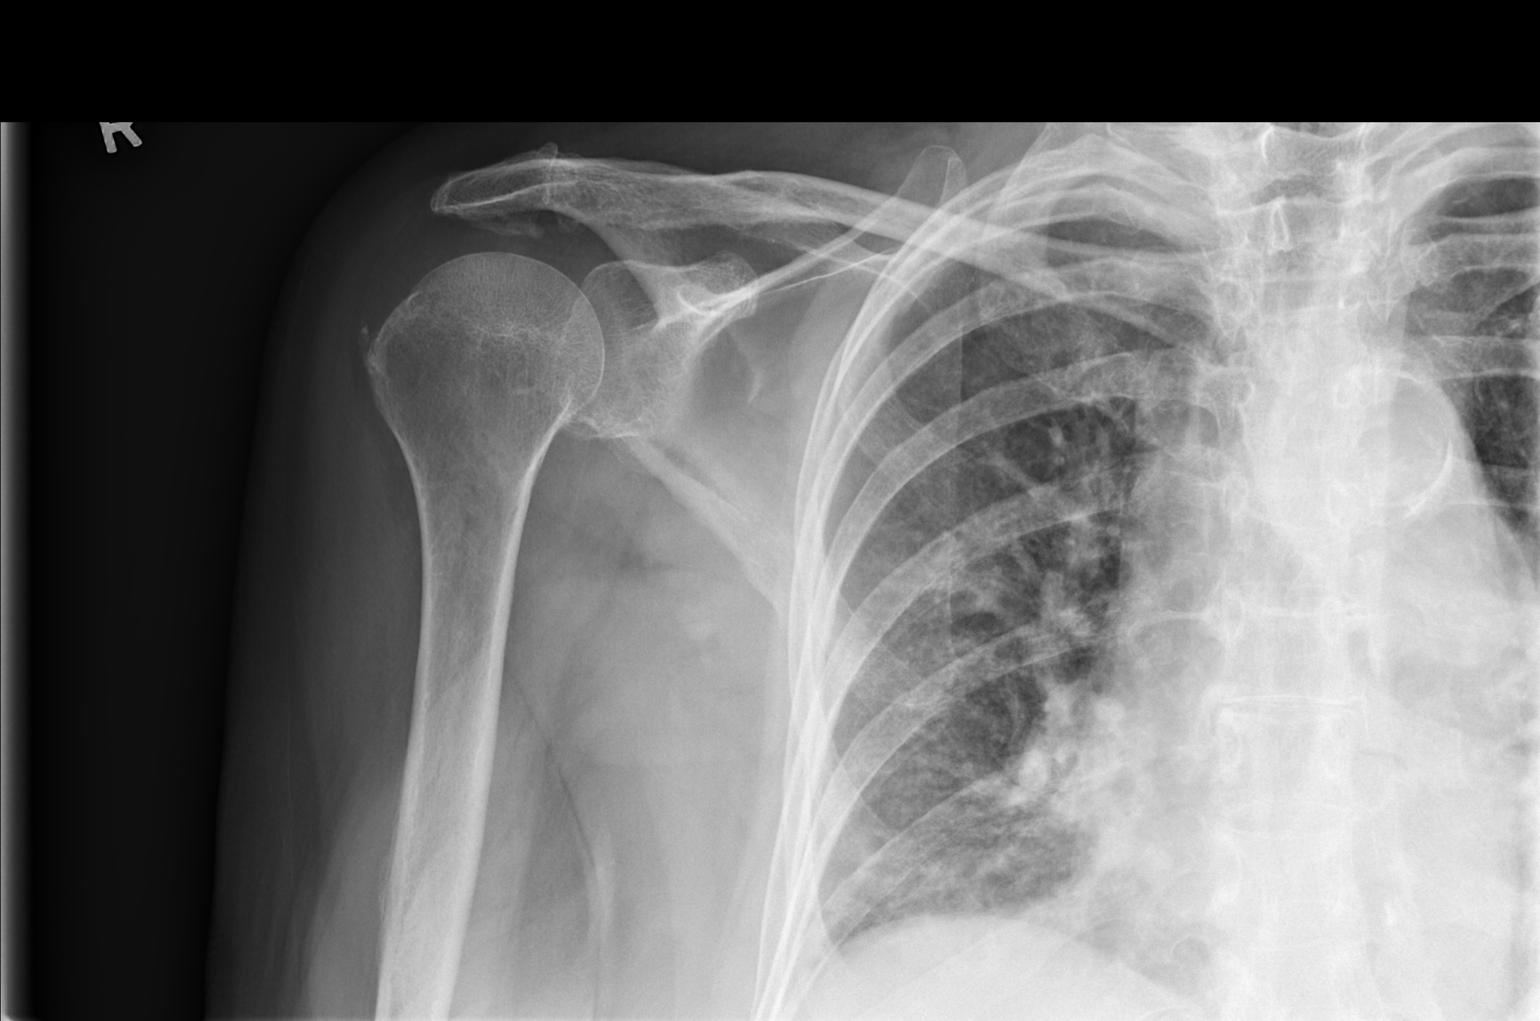

[1 of 1 positions shown; findings below may reference images not displayed]

FINDINGS: The bones appear adequately mineralized. There is spurring in the
region of the insertion of the rotator cuff on the greater
tuberosity of the humerus. The glenohumeral joint is grossly normal.
There is a subacromial spur. There is mild narrowing of the AC
joint. The clavicle, scapula, and humerus exhibit no lytic nor
blastic lesion. The observed portions of the upper right ribs are
normal.
IMPRESSION: There are degenerative changes of the right shoulder. A subacromial
spur may be impacting the rotator cuff. If further imaging
evaluation is felt indicated, MRI would be a useful next imaging
step.

## 2017-09-28 ENCOUNTER — Other Ambulatory Visit (HOSPITAL_COMMUNITY): Payer: Self-pay | Admitting: Internal Medicine

## 2017-09-29 DIAGNOSIS — R05 Cough: Secondary | ICD-10-CM | POA: Diagnosis not present

## 2017-09-29 DIAGNOSIS — J301 Allergic rhinitis due to pollen: Secondary | ICD-10-CM | POA: Diagnosis not present

## 2017-10-08 ENCOUNTER — Other Ambulatory Visit: Payer: Self-pay | Admitting: Geriatric Medicine

## 2017-10-08 DIAGNOSIS — M5442 Lumbago with sciatica, left side: Secondary | ICD-10-CM

## 2017-10-08 DIAGNOSIS — M5441 Lumbago with sciatica, right side: Secondary | ICD-10-CM | POA: Diagnosis not present

## 2017-10-08 DIAGNOSIS — G8929 Other chronic pain: Secondary | ICD-10-CM | POA: Diagnosis not present

## 2017-10-08 DIAGNOSIS — Z853 Personal history of malignant neoplasm of breast: Secondary | ICD-10-CM

## 2017-10-08 DIAGNOSIS — Z79899 Other long term (current) drug therapy: Secondary | ICD-10-CM | POA: Diagnosis not present

## 2017-10-13 IMAGING — DX DG CHEST 2V
2 series · 2 of 2 positions shown · non-contrast
Comparison: 01/25/2015

CLINICAL DATA: Patient here with complaint of "not feeling well".
Was seen by PCP and was found to have high potassium level reading @
7.3. Also reports diarrhea today. Hx of hypokalemia with
supplementation and poor control.

EXAM:
CHEST  2 VIEW

[chest pa]
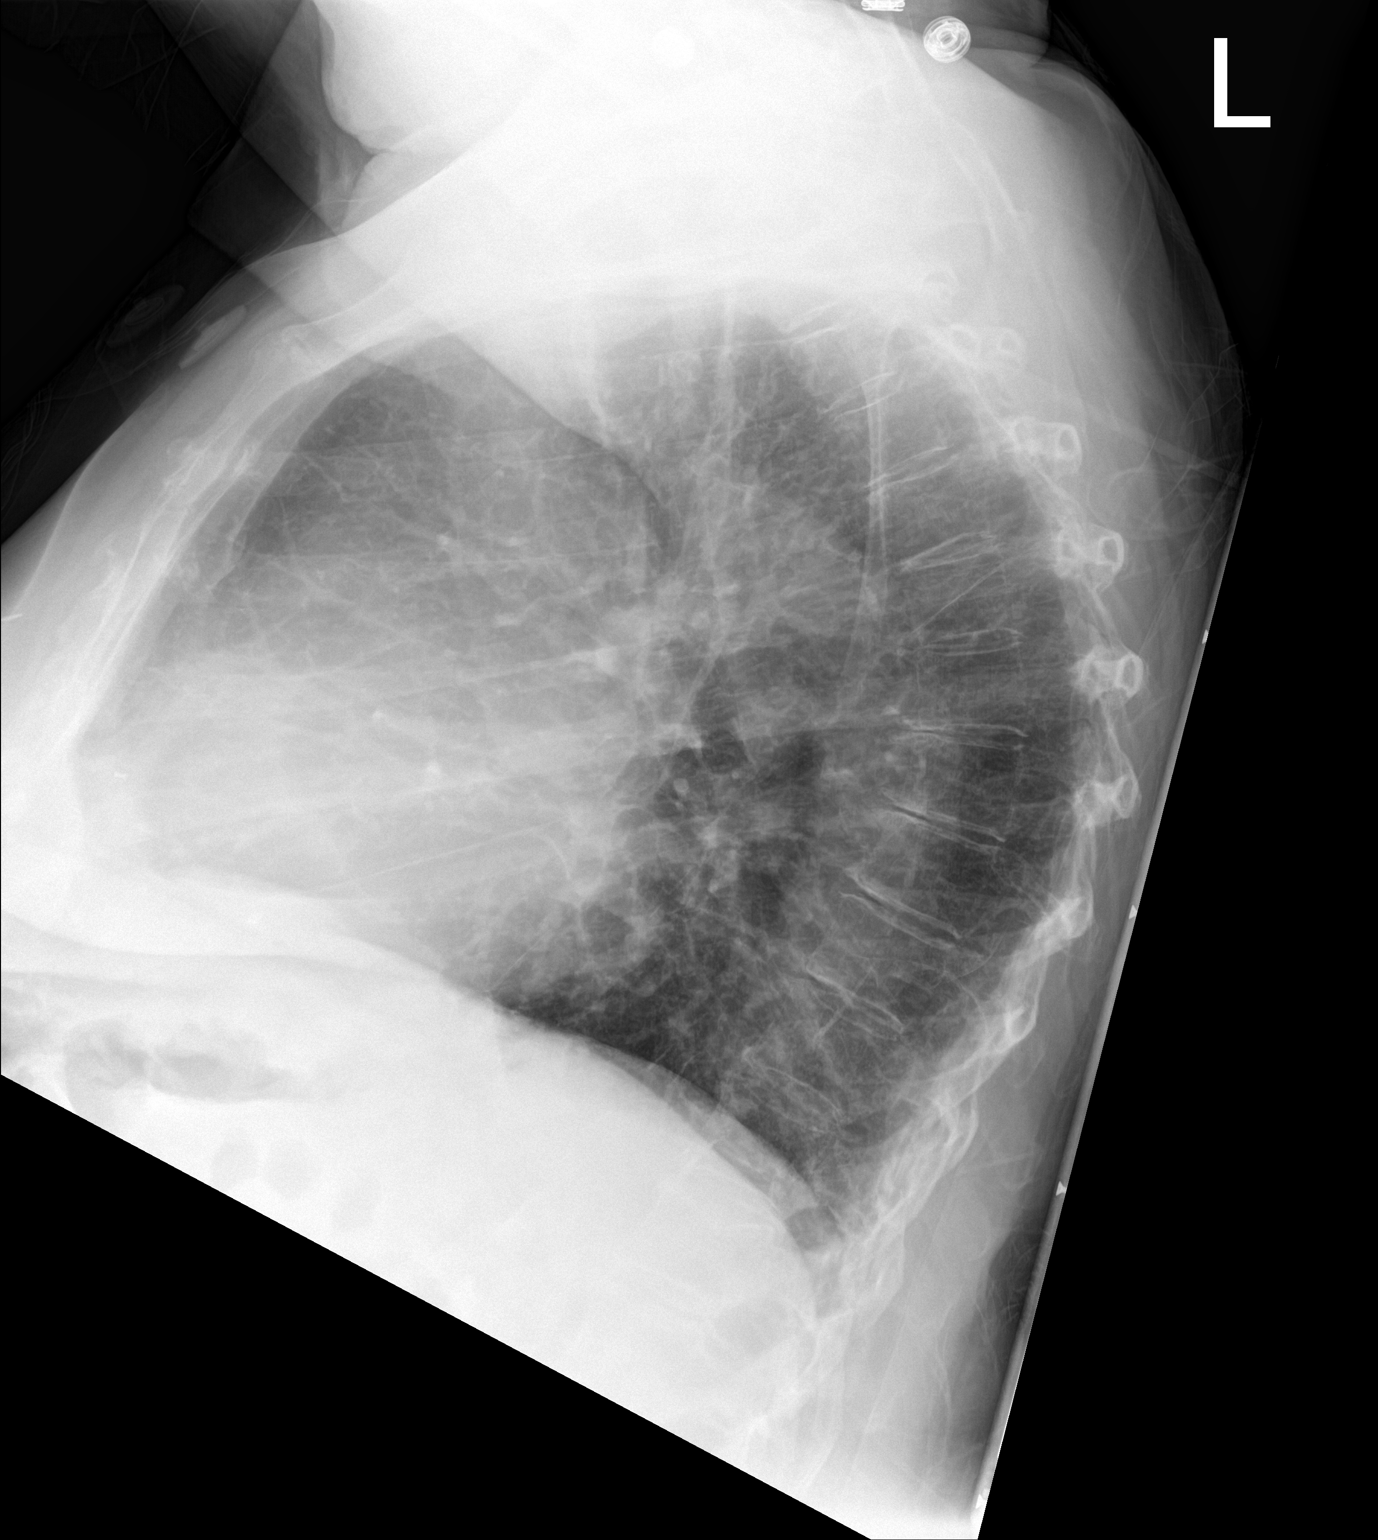

[chest lat]
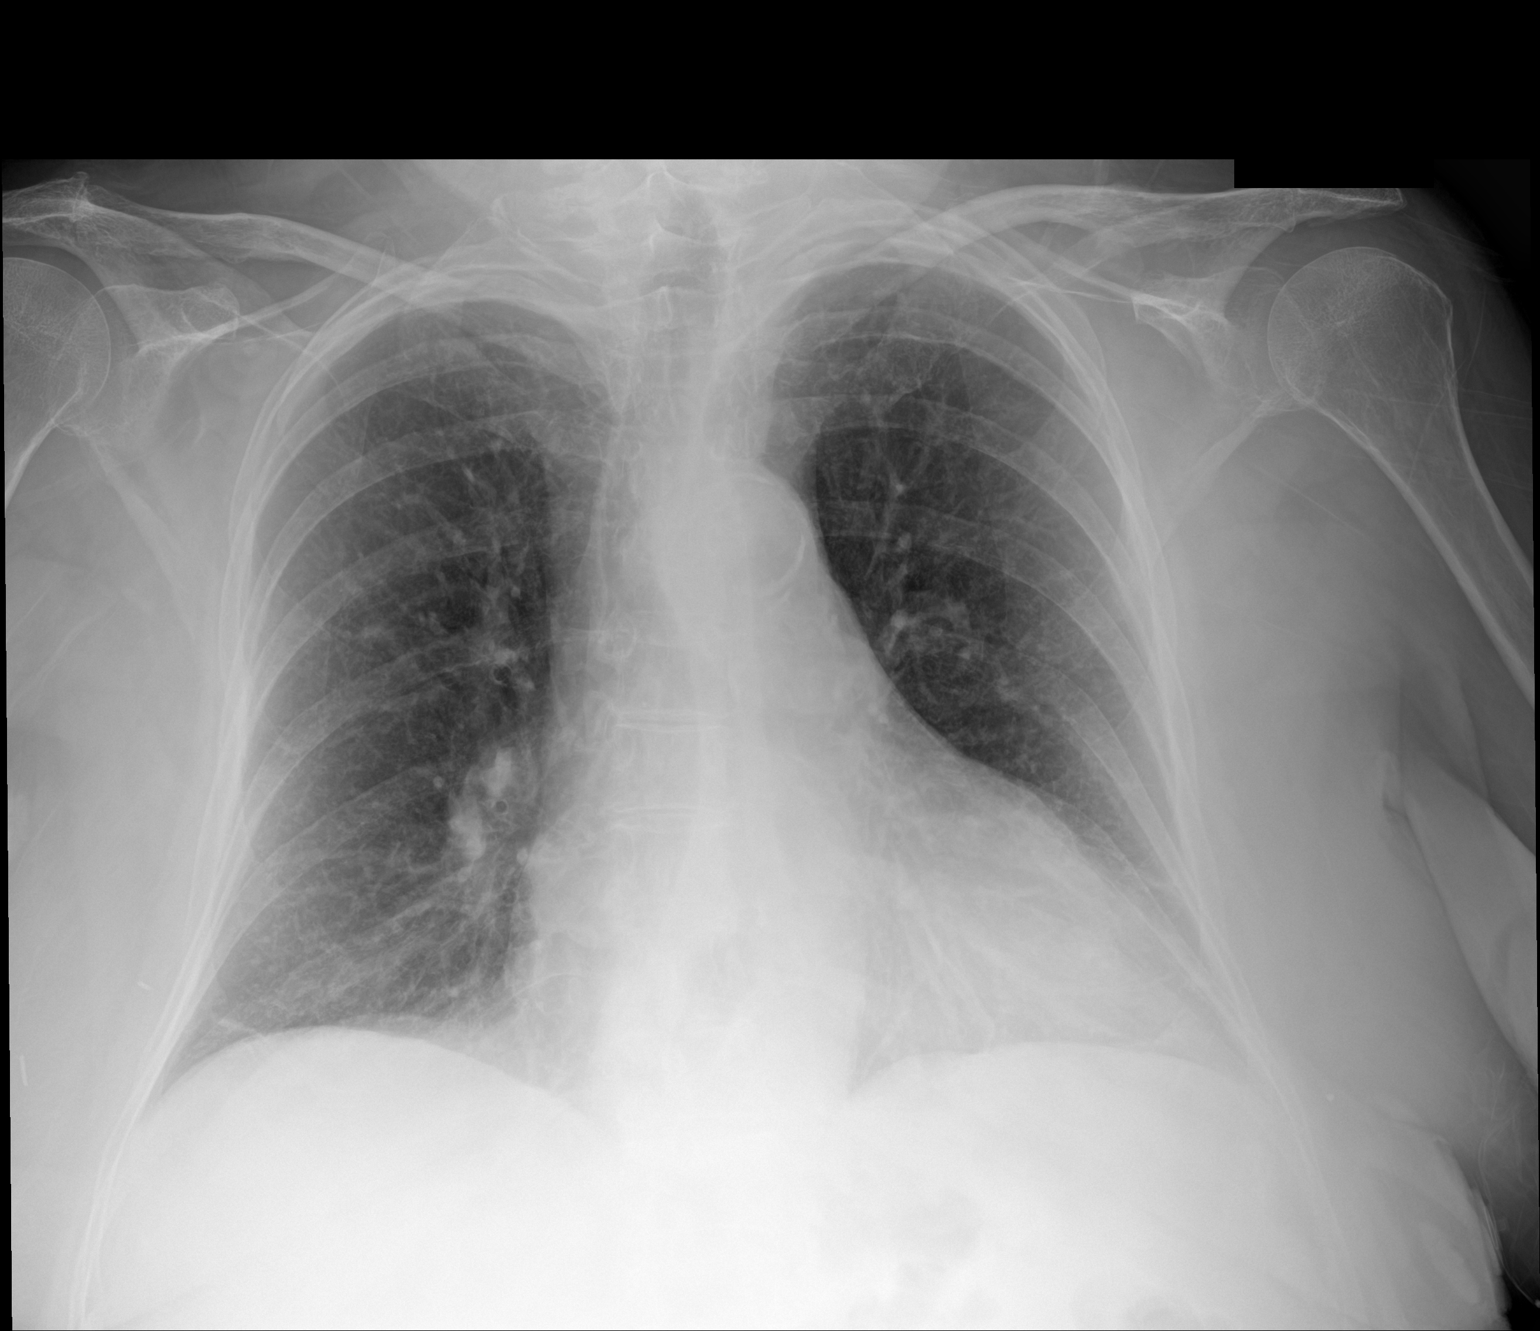

[2 of 2 positions shown; findings below may reference images not displayed]

FINDINGS: Cardiac silhouette is mildly enlarged. There is a small hiatal
hernia. No mediastinal or hilar masses or evidence adenopathy.

Clear lungs.  No pleural effusion or pneumothorax.

Bony thorax is demineralized but grossly intact.
IMPRESSION: No acute cardiopulmonary disease.

## 2017-10-16 ENCOUNTER — Ambulatory Visit
Admission: RE | Admit: 2017-10-16 | Discharge: 2017-10-16 | Disposition: A | Discharge status: Home / Self Care | Payer: PPO | Source: Ambulatory Visit | Attending: Geriatric Medicine | Admitting: Geriatric Medicine

## 2017-10-16 DIAGNOSIS — M5442 Lumbago with sciatica, left side: Secondary | ICD-10-CM

## 2017-10-16 DIAGNOSIS — M5126 Other intervertebral disc displacement, lumbar region: Secondary | ICD-10-CM | POA: Diagnosis not present

## 2017-10-16 DIAGNOSIS — Z853 Personal history of malignant neoplasm of breast: Secondary | ICD-10-CM

## 2017-10-16 MED ORDER — GADOBENATE DIMEGLUMINE 529 MG/ML IV SOLN
7.0000 mL | Freq: Once | INTRAVENOUS | Status: AC | PRN
  Administered 2017-10-16: 7 mL via INTRAVENOUS

## 2017-10-17 DIAGNOSIS — Z1389 Encounter for screening for other disorder: Secondary | ICD-10-CM | POA: Diagnosis not present

## 2017-10-17 DIAGNOSIS — M48061 Spinal stenosis, lumbar region without neurogenic claudication: Secondary | ICD-10-CM | POA: Diagnosis not present

## 2017-10-17 DIAGNOSIS — E669 Obesity, unspecified: Secondary | ICD-10-CM | POA: Diagnosis not present

## 2017-10-17 DIAGNOSIS — Z23 Encounter for immunization: Secondary | ICD-10-CM | POA: Diagnosis not present

## 2017-10-17 DIAGNOSIS — N183 Chronic kidney disease, stage 3 (moderate): Secondary | ICD-10-CM | POA: Diagnosis not present

## 2017-10-17 DIAGNOSIS — J440 Chronic obstructive pulmonary disease with acute lower respiratory infection: Secondary | ICD-10-CM | POA: Diagnosis not present

## 2017-10-17 DIAGNOSIS — E1122 Type 2 diabetes mellitus with diabetic chronic kidney disease: Secondary | ICD-10-CM | POA: Diagnosis not present

## 2017-10-17 DIAGNOSIS — D649 Anemia, unspecified: Secondary | ICD-10-CM | POA: Diagnosis not present

## 2017-10-17 DIAGNOSIS — E782 Mixed hyperlipidemia: Secondary | ICD-10-CM | POA: Diagnosis not present

## 2017-10-17 DIAGNOSIS — Z79899 Other long term (current) drug therapy: Secondary | ICD-10-CM | POA: Diagnosis not present

## 2017-10-17 DIAGNOSIS — Z0000 Encounter for general adult medical examination without abnormal findings: Secondary | ICD-10-CM | POA: Diagnosis not present

## 2017-10-17 DIAGNOSIS — I700 Atherosclerosis of aorta: Secondary | ICD-10-CM | POA: Diagnosis not present

## 2017-10-27 ENCOUNTER — Ambulatory Visit
Admission: RE | Admit: 2017-10-27 | Discharge: 2017-10-27 | Disposition: A | Discharge status: Home / Self Care | Payer: PPO | Source: Ambulatory Visit | Attending: Oncology | Admitting: Oncology

## 2017-10-27 ENCOUNTER — Other Ambulatory Visit: Payer: Self-pay | Admitting: Oncology

## 2017-10-27 DIAGNOSIS — N6001 Solitary cyst of right breast: Secondary | ICD-10-CM | POA: Diagnosis not present

## 2017-10-27 DIAGNOSIS — N6310 Unspecified lump in the right breast, unspecified quadrant: Secondary | ICD-10-CM

## 2017-10-27 DIAGNOSIS — Z9889 Other specified postprocedural states: Secondary | ICD-10-CM

## 2017-10-27 DIAGNOSIS — R928 Other abnormal and inconclusive findings on diagnostic imaging of breast: Secondary | ICD-10-CM | POA: Diagnosis not present

## 2017-10-27 HISTORY — DX: Personal history of antineoplastic chemotherapy: Z92.21

## 2017-10-28 DIAGNOSIS — M4316 Spondylolisthesis, lumbar region: Secondary | ICD-10-CM | POA: Diagnosis not present

## 2017-10-28 DIAGNOSIS — I10 Essential (primary) hypertension: Secondary | ICD-10-CM | POA: Diagnosis not present

## 2017-10-28 DIAGNOSIS — Z6830 Body mass index (BMI) 30.0-30.9, adult: Secondary | ICD-10-CM | POA: Diagnosis not present

## 2017-11-10 ENCOUNTER — Inpatient Hospital Stay: Payer: PPO | Attending: Oncology

## 2017-11-10 ENCOUNTER — Inpatient Hospital Stay: Payer: PPO

## 2017-11-10 VITALS — BP 155/63 | HR 65 | Temp 97.8°F | Resp 18

## 2017-11-10 DIAGNOSIS — Z79811 Long term (current) use of aromatase inhibitors: Secondary | ICD-10-CM | POA: Diagnosis not present

## 2017-11-10 DIAGNOSIS — M8580 Other specified disorders of bone density and structure, unspecified site: Secondary | ICD-10-CM | POA: Insufficient documentation

## 2017-11-10 DIAGNOSIS — C50411 Malignant neoplasm of upper-outer quadrant of right female breast: Secondary | ICD-10-CM

## 2017-11-10 DIAGNOSIS — M85811 Other specified disorders of bone density and structure, right shoulder: Secondary | ICD-10-CM

## 2017-11-10 DIAGNOSIS — M810 Age-related osteoporosis without current pathological fracture: Secondary | ICD-10-CM

## 2017-11-10 LAB — CBC WITH DIFFERENTIAL/PLATELET
Basophils Absolute: 0 10*3/uL (ref 0.0–0.1)
Basophils Relative: 1 %
Eosinophils Absolute: 0.1 10*3/uL (ref 0.0–0.5)
Eosinophils Relative: 3 %
HCT: 35 % (ref 34.8–46.6)
Hemoglobin: 11.7 g/dL (ref 11.6–15.9)
Lymphocytes Relative: 39 %
Lymphs Abs: 1.5 10*3/uL (ref 0.9–3.3)
MCH: 33.2 pg (ref 25.1–34.0)
MCHC: 33.3 g/dL (ref 31.5–36.0)
MCV: 99.6 fL (ref 79.5–101.0)
Monocytes Absolute: 0.4 10*3/uL (ref 0.1–0.9)
Monocytes Relative: 10 %
Neutro Abs: 1.8 10*3/uL (ref 1.5–6.5)
Neutrophils Relative %: 47 %
Platelets: 140 10*3/uL — ABNORMAL LOW (ref 145–400)
RBC: 3.51 MIL/uL — ABNORMAL LOW (ref 3.70–5.45)
RDW: 13.2 % (ref 11.2–14.5)
WBC: 3.9 10*3/uL (ref 3.9–10.3)

## 2017-11-10 LAB — COMPREHENSIVE METABOLIC PANEL
ALT: 25 U/L (ref 0–55)
AST: 23 U/L (ref 5–34)
Albumin: 3.9 g/dL (ref 3.5–5.0)
Alkaline Phosphatase: 41 U/L (ref 40–150)
Anion gap: 7 (ref 3–11)
BUN: 22 mg/dL (ref 7–26)
CO2: 27 mmol/L (ref 22–29)
Calcium: 9.3 mg/dL (ref 8.4–10.4)
Chloride: 104 mmol/L (ref 98–109)
Creatinine, Ser: 1.5 mg/dL — ABNORMAL HIGH (ref 0.60–1.10)
GFR calc Af Amer: 36 mL/min — ABNORMAL LOW (ref >60)
GFR calc non Af Amer: 31 mL/min — ABNORMAL LOW (ref >60)
Glucose, Bld: 182 mg/dL — ABNORMAL HIGH (ref 70–140)
Potassium: 3.6 mmol/L (ref 3.5–5.1)
Sodium: 138 mmol/L (ref 136–145)
Total Bilirubin: 0.4 mg/dL (ref 0.2–1.2)
Total Protein: 6.6 g/dL (ref 6.4–8.3)

## 2017-11-10 MED ORDER — DENOSUMAB 60 MG/ML ~~LOC~~ SOSY
60.0000 mg | PREFILLED_SYRINGE | Freq: Once | SUBCUTANEOUS | Status: AC
  Administered 2017-11-10: 60 mg via SUBCUTANEOUS

## 2017-11-10 NOTE — Patient Instructions (Signed)

## 2017-11-11 DIAGNOSIS — M47816 Spondylosis without myelopathy or radiculopathy, lumbar region: Secondary | ICD-10-CM | POA: Diagnosis not present

## 2017-11-11 DIAGNOSIS — M48061 Spinal stenosis, lumbar region without neurogenic claudication: Secondary | ICD-10-CM | POA: Diagnosis not present

## 2017-11-11 DIAGNOSIS — M4316 Spondylolisthesis, lumbar region: Secondary | ICD-10-CM | POA: Diagnosis not present

## 2017-11-11 DIAGNOSIS — M461 Sacroiliitis, not elsewhere classified: Secondary | ICD-10-CM | POA: Diagnosis not present

## 2017-11-17 ENCOUNTER — Other Ambulatory Visit (HOSPITAL_COMMUNITY): Payer: Self-pay | Admitting: Internal Medicine

## 2017-11-17 DIAGNOSIS — I5022 Chronic systolic (congestive) heart failure: Secondary | ICD-10-CM

## 2017-12-03 DIAGNOSIS — M47816 Spondylosis without myelopathy or radiculopathy, lumbar region: Secondary | ICD-10-CM | POA: Diagnosis not present

## 2017-12-17 DIAGNOSIS — J440 Chronic obstructive pulmonary disease with acute lower respiratory infection: Secondary | ICD-10-CM | POA: Diagnosis not present

## 2017-12-17 DIAGNOSIS — C50919 Malignant neoplasm of unspecified site of unspecified female breast: Secondary | ICD-10-CM | POA: Diagnosis not present

## 2017-12-17 DIAGNOSIS — E782 Mixed hyperlipidemia: Secondary | ICD-10-CM | POA: Diagnosis not present

## 2017-12-17 DIAGNOSIS — D509 Iron deficiency anemia, unspecified: Secondary | ICD-10-CM | POA: Diagnosis not present

## 2017-12-17 DIAGNOSIS — E1122 Type 2 diabetes mellitus with diabetic chronic kidney disease: Secondary | ICD-10-CM | POA: Diagnosis not present

## 2017-12-17 DIAGNOSIS — N183 Chronic kidney disease, stage 3 (moderate): Secondary | ICD-10-CM | POA: Diagnosis not present

## 2017-12-17 DIAGNOSIS — Z7984 Long term (current) use of oral hypoglycemic drugs: Secondary | ICD-10-CM | POA: Diagnosis not present

## 2017-12-24 DIAGNOSIS — E1122 Type 2 diabetes mellitus with diabetic chronic kidney disease: Secondary | ICD-10-CM | POA: Diagnosis not present

## 2018-01-19 DIAGNOSIS — D509 Iron deficiency anemia, unspecified: Secondary | ICD-10-CM | POA: Diagnosis not present

## 2018-01-19 DIAGNOSIS — I129 Hypertensive chronic kidney disease with stage 1 through stage 4 chronic kidney disease, or unspecified chronic kidney disease: Secondary | ICD-10-CM | POA: Diagnosis not present

## 2018-01-19 DIAGNOSIS — C50919 Malignant neoplasm of unspecified site of unspecified female breast: Secondary | ICD-10-CM | POA: Diagnosis not present

## 2018-01-19 DIAGNOSIS — N183 Chronic kidney disease, stage 3 (moderate): Secondary | ICD-10-CM | POA: Diagnosis not present

## 2018-01-19 DIAGNOSIS — Z7984 Long term (current) use of oral hypoglycemic drugs: Secondary | ICD-10-CM | POA: Diagnosis not present

## 2018-01-19 DIAGNOSIS — J440 Chronic obstructive pulmonary disease with acute lower respiratory infection: Secondary | ICD-10-CM | POA: Diagnosis not present

## 2018-01-19 DIAGNOSIS — E1122 Type 2 diabetes mellitus with diabetic chronic kidney disease: Secondary | ICD-10-CM | POA: Diagnosis not present

## 2018-01-19 DIAGNOSIS — E782 Mixed hyperlipidemia: Secondary | ICD-10-CM | POA: Diagnosis not present

## 2018-02-09 DIAGNOSIS — E782 Mixed hyperlipidemia: Secondary | ICD-10-CM | POA: Diagnosis not present

## 2018-02-09 DIAGNOSIS — I129 Hypertensive chronic kidney disease with stage 1 through stage 4 chronic kidney disease, or unspecified chronic kidney disease: Secondary | ICD-10-CM | POA: Diagnosis not present

## 2018-02-09 DIAGNOSIS — J440 Chronic obstructive pulmonary disease with acute lower respiratory infection: Secondary | ICD-10-CM | POA: Diagnosis not present

## 2018-02-09 DIAGNOSIS — E1142 Type 2 diabetes mellitus with diabetic polyneuropathy: Secondary | ICD-10-CM | POA: Diagnosis not present

## 2018-02-09 DIAGNOSIS — D509 Iron deficiency anemia, unspecified: Secondary | ICD-10-CM | POA: Diagnosis not present

## 2018-02-09 DIAGNOSIS — C50919 Malignant neoplasm of unspecified site of unspecified female breast: Secondary | ICD-10-CM | POA: Diagnosis not present

## 2018-02-09 DIAGNOSIS — E1165 Type 2 diabetes mellitus with hyperglycemia: Secondary | ICD-10-CM | POA: Diagnosis not present

## 2018-02-09 DIAGNOSIS — D649 Anemia, unspecified: Secondary | ICD-10-CM | POA: Diagnosis not present

## 2018-02-09 DIAGNOSIS — Z794 Long term (current) use of insulin: Secondary | ICD-10-CM | POA: Diagnosis not present

## 2018-02-09 DIAGNOSIS — N183 Chronic kidney disease, stage 3 (moderate): Secondary | ICD-10-CM | POA: Diagnosis not present

## 2018-02-09 DIAGNOSIS — E1122 Type 2 diabetes mellitus with diabetic chronic kidney disease: Secondary | ICD-10-CM | POA: Diagnosis not present

## 2018-02-16 ENCOUNTER — Other Ambulatory Visit (HOSPITAL_COMMUNITY): Payer: Self-pay | Admitting: Internal Medicine

## 2018-02-16 DIAGNOSIS — I5022 Chronic systolic (congestive) heart failure: Secondary | ICD-10-CM

## 2018-03-29 ENCOUNTER — Other Ambulatory Visit: Payer: Self-pay | Admitting: Oncology

## 2018-03-29 DIAGNOSIS — C50411 Malignant neoplasm of upper-outer quadrant of right female breast: Secondary | ICD-10-CM

## 2018-03-30 DIAGNOSIS — Z23 Encounter for immunization: Secondary | ICD-10-CM | POA: Diagnosis not present

## 2018-04-02 DIAGNOSIS — Z23 Encounter for immunization: Secondary | ICD-10-CM | POA: Diagnosis not present

## 2018-04-02 DIAGNOSIS — L821 Other seborrheic keratosis: Secondary | ICD-10-CM | POA: Diagnosis not present

## 2018-04-02 DIAGNOSIS — Z85828 Personal history of other malignant neoplasm of skin: Secondary | ICD-10-CM | POA: Diagnosis not present

## 2018-04-02 DIAGNOSIS — Z808 Family history of malignant neoplasm of other organs or systems: Secondary | ICD-10-CM | POA: Diagnosis not present

## 2018-04-13 ENCOUNTER — Other Ambulatory Visit: Payer: Self-pay

## 2018-04-13 ENCOUNTER — Ambulatory Visit (HOSPITAL_COMMUNITY)
Admission: RE | Admit: 2018-04-13 | Discharge: 2018-04-13 | Disposition: A | Discharge status: Home / Self Care | Payer: PPO | Source: Ambulatory Visit | Attending: Internal Medicine | Admitting: Internal Medicine

## 2018-04-13 ENCOUNTER — Encounter (HOSPITAL_COMMUNITY): Payer: Self-pay | Admitting: Internal Medicine

## 2018-04-13 ENCOUNTER — Other Ambulatory Visit (HOSPITAL_COMMUNITY): Payer: Self-pay | Admitting: Cardiology

## 2018-04-13 VITALS — BP 132/53 | HR 66 | Wt 158.8 lb

## 2018-04-13 DIAGNOSIS — E875 Hyperkalemia: Secondary | ICD-10-CM | POA: Insufficient documentation

## 2018-04-13 DIAGNOSIS — I10 Essential (primary) hypertension: Secondary | ICD-10-CM

## 2018-04-13 DIAGNOSIS — Z801 Family history of malignant neoplasm of trachea, bronchus and lung: Secondary | ICD-10-CM | POA: Diagnosis not present

## 2018-04-13 DIAGNOSIS — Z79899 Other long term (current) drug therapy: Secondary | ICD-10-CM | POA: Insufficient documentation

## 2018-04-13 DIAGNOSIS — K219 Gastro-esophageal reflux disease without esophagitis: Secondary | ICD-10-CM | POA: Diagnosis not present

## 2018-04-13 DIAGNOSIS — Z79811 Long term (current) use of aromatase inhibitors: Secondary | ICD-10-CM | POA: Diagnosis not present

## 2018-04-13 DIAGNOSIS — M1990 Unspecified osteoarthritis, unspecified site: Secondary | ICD-10-CM | POA: Insufficient documentation

## 2018-04-13 DIAGNOSIS — R9431 Abnormal electrocardiogram [ECG] [EKG]: Secondary | ICD-10-CM | POA: Insufficient documentation

## 2018-04-13 DIAGNOSIS — E1140 Type 2 diabetes mellitus with diabetic neuropathy, unspecified: Secondary | ICD-10-CM | POA: Insufficient documentation

## 2018-04-13 DIAGNOSIS — N183 Chronic kidney disease, stage 3 (moderate): Secondary | ICD-10-CM | POA: Diagnosis not present

## 2018-04-13 DIAGNOSIS — Z7984 Long term (current) use of oral hypoglycemic drugs: Secondary | ICD-10-CM | POA: Insufficient documentation

## 2018-04-13 DIAGNOSIS — Z853 Personal history of malignant neoplasm of breast: Secondary | ICD-10-CM | POA: Diagnosis not present

## 2018-04-13 DIAGNOSIS — E119 Type 2 diabetes mellitus without complications: Secondary | ICD-10-CM | POA: Diagnosis not present

## 2018-04-13 DIAGNOSIS — Z800 Family history of malignant neoplasm of digestive organs: Secondary | ICD-10-CM | POA: Insufficient documentation

## 2018-04-13 DIAGNOSIS — I5032 Chronic diastolic (congestive) heart failure: Secondary | ICD-10-CM

## 2018-04-13 DIAGNOSIS — I2510 Atherosclerotic heart disease of native coronary artery without angina pectoris: Secondary | ICD-10-CM | POA: Diagnosis not present

## 2018-04-13 DIAGNOSIS — Z8249 Family history of ischemic heart disease and other diseases of the circulatory system: Secondary | ICD-10-CM | POA: Diagnosis not present

## 2018-04-13 DIAGNOSIS — I130 Hypertensive heart and chronic kidney disease with heart failure and stage 1 through stage 4 chronic kidney disease, or unspecified chronic kidney disease: Secondary | ICD-10-CM | POA: Diagnosis not present

## 2018-04-13 DIAGNOSIS — E1122 Type 2 diabetes mellitus with diabetic chronic kidney disease: Secondary | ICD-10-CM | POA: Insufficient documentation

## 2018-04-13 DIAGNOSIS — E785 Hyperlipidemia, unspecified: Secondary | ICD-10-CM | POA: Diagnosis not present

## 2018-04-13 DIAGNOSIS — Z7982 Long term (current) use of aspirin: Secondary | ICD-10-CM | POA: Diagnosis not present

## 2018-04-13 NOTE — Progress Notes (Signed)
Advanced Heart Failure Clinic Note   Patient ID: Rebecca Hall, female   DOB: 04/08/1932, 84 y.o.   MRN: [id number] PCP: Dr Felipa Eth.  Primary HF: Dr Haroldine Laws Oncologist: Dr Jana Hakim  HPI: Rebecca Hall is a 84 y.o. female with history of Breast Cancer treated with 3 doses only of herceptin in 2014, CKD, HTN, HLD, Osteoporosis, GERD, Hx of GI Bleed, and DM.  Admitted to [facility] 8/16 with hypovolemic shock, AKI creatinine 4.7. After fluid resuscitation developed pulmonary edema. Found to have new onset LV dysfunction. Had cath as noted below with 1v CAD with 60% proximal LAD stenosis and NICM. Discharged on lasix, carvedilol, and Entresto. Discharge weight was 156 pounds.   Admitted 06/30/15 with weakness and dizziness and found to have AKI, hyperkalemia, and hypotension. (Creatinine 3.69 and K 7.4 on admit) Pt had stopped spiro several days prior with dizziness. K treated in ED as well as inpatient setting. Received IV fluid for hypotension.    She presents today for regular follow up. Last seen 12/2016. Echo at that time EF 55-60%. She is under a lot of stress taking care of her husband with PMR and pulmonary fibrosis. May be nearing Hospice care. Denies CP. Does get SOB with moderate exertion. No change.    Echo 7/18 EF 55-60% RV ok.   [facility] 01/27/2015  RA = 11 RV = 42/8/15 PA = 38/8 (24) PCW = 19 Fick cardiac output/index = 5.2/3.0 Thermo CO/CI = 5.7/3.2 PVR = 1.0 WU FA sat = 93% PA sat = 51%, 51% Ao Pressure: 133/60 (85) LV Pressure: 133/9/17 No aortic stenosis  Assessment: 1) Moderate 1v CAD with 60% proximal LAD lesion 2) Non-ischemic CM with EF 30-35% by echo 3) Mildly elevated R-sided pressures with normal cardiac output  Labs:   06/09/15 K 2.7, Creatinine 1.4 07/14/2015: K 4.2 Creatinine 2.35  07/31/2015: K 4.1 Creatinine 1.34  7/17: K 3.3, creatinine 1.52 5/19: K 3.6, creatinine 1.50  Review of systems complete and found to be negative unless listed in HPI.    SH:    Social History   Socioeconomic History  . Marital status: Married    Spouse name: Not on file  . Number of children: Not on file  . Years of education: Not on file  . Highest education level: Not on file  Occupational History  . Not on file  Social Needs  . Financial resource strain: Not on file  . Food insecurity:    Worry: Not on file    Inability: Not on file  . Transportation needs:    Medical: Not on file    Non-medical: Not on file  Tobacco Use  . Smoking status: Never Smoker  . Smokeless tobacco: Never Used  Substance and Sexual Activity  . Alcohol use: No  . Drug use: No  . Sexual activity: Not on file    Comment: first live birth age 75, P 2, Estrogen   Lifestyle  . Physical activity:    Days per week: Not on file    Minutes per session: Not on file  . Stress: Not on file  Relationships  . Social connections:    Talks on phone: Not on file    Gets together: Not on file    Attends religious service: Not on file    Active member of club or organization: Not on file    Attends meetings of clubs or organizations: Not on file    Relationship status: Not on file  . Intimate  partner violence:    Fear of current or ex partner: Not on file    Emotionally abused: Not on file    Physically abused: Not on file    Forced sexual activity: Not on file  Other Topics Concern  . Not on file  Social History Narrative  . Not on file    FH:  Family History  Problem Relation Age of Onset  . Cancer Maternal Aunt 80       colon cancer  . Cancer Maternal Uncle 65       lung  . Cancer Maternal Grandmother        pancreas  . Cerebral aneurysm Father     Past Medical History:  Diagnosis Date  . Arthritis   . Breast cancer ([place]) 03/15/13   right, 12 o'clock  . Chronic kidney disease   . Diabetes mellitus without complication ([place])   . GERD (gastroesophageal reflux disease)   . History of lower GI bleeding   . Hyperkalemia 12/21/2015  . Hyperlipidemia   .  Hypertension   . IBS (irritable bowel syndrome)   . Neuromuscular disorder ([place])    peripheral neuropathy feet  . Osteoporosis   . Personal history of chemotherapy     Current Outpatient Medications  Medication Sig Dispense Refill  . allopurinol (ZYLOPRIM) 300 MG tablet Take 300 mg by mouth daily.    Marland Kitchen amLODipine (NORVASC) 2.5 MG tablet TAKE 1 TABLET BY MOUTH DAILY. 90 tablet 2  . anastrozole (ARIMIDEX) 1 MG tablet take 1 tablet by mouth once daily 90 tablet 0  . aspirin 81 MG tablet Take 81 mg by mouth at bedtime.     Marland Kitchen atorvastatin (LIPITOR) 20 MG tablet Take 20 mg by mouth every morning.     . calcium carbonate (OS-CAL) 600 MG TABS tablet Take 600 mg by mouth 2 (two) times daily with a meal.     . carvedilol (COREG) 3.125 MG tablet TAKE ONE TABLET BY MOUTH TWICE DAILY  180 tablet 0  . denosumab (PROLIA) 60 MG/ML SOLN injection Inject 60 mg into the skin every 6 (six) months. Administer in upper arm, thigh, or abdomen    . fenofibrate (TRICOR) 48 MG tablet Take 48 mg by mouth daily.    . ferrous sulfate 325 (65 FE) MG EC tablet Take 325 mg by mouth daily with breakfast.    . furosemide (LASIX) 20 MG tablet TAKE 40MG  EVERY MORNING AND AN ADDITIONAL 20MG  EVERY EVENING AS NEEDED FOR SWELLING 270 tablet 2  . gabapentin (NEURONTIN) 300 MG capsule TAKE ONE CAPSULE BY MOUTH AT BEDTIME  90 capsule 0  . glimepiride (AMARYL) 2 MG tablet Take 2 mg by mouth daily with breakfast.    . linagliptin (TRADJENTA) 5 MG TABS tablet Take 5 mg by mouth daily.    Marland Kitchen loratadine (CLARITIN) 10 MG tablet Take 10 mg by mouth every morning.     . Melatonin 5 MG TABS Take 5 mg by mouth at bedtime as needed (As needed for sleep).    . minoxidil (ROGAINE) 2 % external solution Apply 1 application topically 3 (three) times a week.     . Multiple Vitamin (MULTIVITAMIN WITH MINERALS) TABS tablet Take 1 tablet by mouth 2 (two) times daily.     Marland Kitchen omeprazole (PRILOSEC) 20 MG capsule Take 20 mg by mouth daily.     .  potassium chloride (K-DUR,KLOR-CON) 10 MEQ tablet TAKE 2 TABLETS (20 MEQ TOTAL) BY MOUTH 2 (TWO) TIMES DAILY. 120 tablet  2  . traMADol (ULTRAM) 50 MG tablet Take 50 mg by mouth every 6 (six) hours as needed for moderate pain.     . Vitamin D, Ergocalciferol, (DRISDOL) 50000 UNITS CAPS capsule Take 50,000 Units by mouth every 14 (fourteen) days.     . vitamin E 100 UNIT capsule Take 100 Units by mouth daily.     No current facility-administered medications for this encounter.     Vitals:   04/13/18 1345  BP: (!) 132/53  Pulse: 66  SpO2: 96%  Weight: 72 kg (158 lb 12.8 oz)    Wt Readings from Last 3 Encounters:  05/12/17 73.6 kg (162 lb 4.8 oz)  01/02/17 71.7 kg (158 lb 1.9 oz)  06/28/16 73.2 kg (161 lb 6.4 oz)    PHYSICAL EXAM:  General: Well appearing. No resp difficulty. HEENT: Normal Neck: Supple. JVP 5-6. Carotids 2+ bilat; no bruits. No thyromegaly or nodule noted. Cor: PMI nondisplaced. RRR, No M/G/R noted Lungs: CTAB, normal effort. Abdomen: Soft, non-tender, non-distended, no HSM. No bruits or masses. +BS  Extremities: No cyanosis, clubbing, or rash. R and LLE no edema.  Neuro: Alert & orientedx3, cranial nerves grossly intact. moves all 4 extremities w/o difficulty. Affect pleasant   ECG: NSR 68 1AVB 240 ms IVCD 119ms Personally reviewed   ASSESSMENT & PLAN: 1. Chronic diastolic CHF:  - She has history of nonischemic cardiomyopathy. EF now recovered.  - Echo 12/2016 Normal LV and RV, mild LVH.  - Stable NYHA II - Volume status looks good  - Continue lasix 40 mg daily . Can decrease as needed - Continue carvedilol 3.125 mg BID - Off lisinopril.  Would not re-challenge given improvement in LV systolic function.  - Reinforced fluid restriction to < 2 L daily, sodium restriction to less than 2000 mg daily, and the importance of daily weights.   2. CKD: Stage 3 - Baseline creatinine 1.5. Followed by Dr. Felipa Eth 3. Hx of Breast Cancer: Only received 3 doses of  Herceptin in 2014. Stopped due to possible allergy. 4. DM2 - stable 5. HTN:  -Blood pressure well controlled. Continue current regimen. -Goal SBP 130-140.  6. HLD:  - Continue lipitor 20 mg daily 7. Heavy snoring:  - Has refused sleep study.  8. CAD:  - Nonobstructive on prior cath.  - No s/s of ischemia.    - Continue lipitor 20 mg and ASA 81 mg daily.   Glori Bickers, MD  2:01 PM

## 2018-04-13 NOTE — Addendum Note (Signed)
Encounter addended by: Scarlette Calico, RN on: 04/13/2018 2:19 PM  Actions taken: Order list changed, Diagnosis association updated

## 2018-04-13 NOTE — Patient Instructions (Signed)
We will contact you in 6 months to schedule your next appointment and echocardiogram  

## 2018-04-16 DIAGNOSIS — E1122 Type 2 diabetes mellitus with diabetic chronic kidney disease: Secondary | ICD-10-CM | POA: Diagnosis not present

## 2018-04-16 DIAGNOSIS — D509 Iron deficiency anemia, unspecified: Secondary | ICD-10-CM | POA: Diagnosis not present

## 2018-04-16 DIAGNOSIS — I129 Hypertensive chronic kidney disease with stage 1 through stage 4 chronic kidney disease, or unspecified chronic kidney disease: Secondary | ICD-10-CM | POA: Diagnosis not present

## 2018-04-16 DIAGNOSIS — E1165 Type 2 diabetes mellitus with hyperglycemia: Secondary | ICD-10-CM | POA: Diagnosis not present

## 2018-04-16 DIAGNOSIS — D649 Anemia, unspecified: Secondary | ICD-10-CM | POA: Diagnosis not present

## 2018-04-16 DIAGNOSIS — E782 Mixed hyperlipidemia: Secondary | ICD-10-CM | POA: Diagnosis not present

## 2018-04-16 DIAGNOSIS — J440 Chronic obstructive pulmonary disease with acute lower respiratory infection: Secondary | ICD-10-CM | POA: Diagnosis not present

## 2018-04-16 DIAGNOSIS — N183 Chronic kidney disease, stage 3 (moderate): Secondary | ICD-10-CM | POA: Diagnosis not present

## 2018-04-16 DIAGNOSIS — C50919 Malignant neoplasm of unspecified site of unspecified female breast: Secondary | ICD-10-CM | POA: Diagnosis not present

## 2018-04-16 DIAGNOSIS — E1142 Type 2 diabetes mellitus with diabetic polyneuropathy: Secondary | ICD-10-CM | POA: Diagnosis not present

## 2018-04-20 DIAGNOSIS — N183 Chronic kidney disease, stage 3 (moderate): Secondary | ICD-10-CM | POA: Diagnosis not present

## 2018-04-20 DIAGNOSIS — I129 Hypertensive chronic kidney disease with stage 1 through stage 4 chronic kidney disease, or unspecified chronic kidney disease: Secondary | ICD-10-CM | POA: Diagnosis not present

## 2018-04-20 DIAGNOSIS — E1122 Type 2 diabetes mellitus with diabetic chronic kidney disease: Secondary | ICD-10-CM | POA: Diagnosis not present

## 2018-04-27 DIAGNOSIS — J440 Chronic obstructive pulmonary disease with acute lower respiratory infection: Secondary | ICD-10-CM | POA: Diagnosis not present

## 2018-04-27 DIAGNOSIS — D649 Anemia, unspecified: Secondary | ICD-10-CM | POA: Diagnosis not present

## 2018-04-27 DIAGNOSIS — E1165 Type 2 diabetes mellitus with hyperglycemia: Secondary | ICD-10-CM | POA: Diagnosis not present

## 2018-04-27 DIAGNOSIS — E1142 Type 2 diabetes mellitus with diabetic polyneuropathy: Secondary | ICD-10-CM | POA: Diagnosis not present

## 2018-04-27 DIAGNOSIS — D509 Iron deficiency anemia, unspecified: Secondary | ICD-10-CM | POA: Diagnosis not present

## 2018-04-27 DIAGNOSIS — E782 Mixed hyperlipidemia: Secondary | ICD-10-CM | POA: Diagnosis not present

## 2018-04-27 DIAGNOSIS — I129 Hypertensive chronic kidney disease with stage 1 through stage 4 chronic kidney disease, or unspecified chronic kidney disease: Secondary | ICD-10-CM | POA: Diagnosis not present

## 2018-04-27 DIAGNOSIS — N183 Chronic kidney disease, stage 3 (moderate): Secondary | ICD-10-CM | POA: Diagnosis not present

## 2018-04-27 DIAGNOSIS — C50919 Malignant neoplasm of unspecified site of unspecified female breast: Secondary | ICD-10-CM | POA: Diagnosis not present

## 2018-04-27 DIAGNOSIS — E1122 Type 2 diabetes mellitus with diabetic chronic kidney disease: Secondary | ICD-10-CM | POA: Diagnosis not present

## 2018-05-04 ENCOUNTER — Ambulatory Visit
Admission: RE | Admit: 2018-05-04 | Discharge: 2018-05-04 | Disposition: A | Discharge status: Home / Self Care | Payer: PPO | Source: Ambulatory Visit | Attending: Oncology | Admitting: Oncology

## 2018-05-04 DIAGNOSIS — N6310 Unspecified lump in the right breast, unspecified quadrant: Secondary | ICD-10-CM

## 2018-05-04 DIAGNOSIS — Z9889 Other specified postprocedural states: Secondary | ICD-10-CM

## 2018-05-04 DIAGNOSIS — R928 Other abnormal and inconclusive findings on diagnostic imaging of breast: Secondary | ICD-10-CM | POA: Diagnosis not present

## 2018-05-04 DIAGNOSIS — Z853 Personal history of malignant neoplasm of breast: Secondary | ICD-10-CM | POA: Diagnosis not present

## 2018-05-04 DIAGNOSIS — N6311 Unspecified lump in the right breast, upper outer quadrant: Secondary | ICD-10-CM | POA: Diagnosis not present

## 2018-05-09 NOTE — Progress Notes (Signed)
ID: Rebecca Hall OB: 01/09/1932  MR#: [id number]  CSN#:[id number]  PCP: [name], MD GYN:   SU: Rolm Bookbinder OTHER MD: Floyde Parkins, Earle Gell, Daniel Bensimhon  CHIEF COMPLAINT:  Estrogen receptor positive Breast Cancer  CURRENT TREATMENT: Completing 5 years of anastrozole   HISTORY OF PRESENT ILLNESS: From the original intake note:  Rebecca Hall had routine screening mammography at [facility] 03/04/2013 showing a possible mass in the right breast, measuring 4 mm in diameter. Additional views 03/10/2013 confirmed an area of asymmetry in the right breast which was hypoechoic by ultrasonography. Biopsy of this area 03/15/2013 showed ([id number]) and invasive ductal carcinoma, grade 1 or 2, estrogen receptor 100% positive, progesterone receptor 100% positive, with a proliferation marker of 30% and HER-2 amplification by CISH with a ratio of 3.51 and an average HER-2 copy number of 6.85.  On 04/05/2013 the patient underwent bilateral breast MRIs. This showed in the right breast an 8 mm mass at the site of the known biopsy site, with surrounding known masslike enhancement. The entire area of concern measured 1.9 cm. There were no abnormal appearing lymph nodes of concern. In the left breast there were 2 areas of clumped non-masslike enhancement measuring 0.5 cm and 2.4 cm.  The patient's subsequent history is as detailed below  INTERVAL HISTORY: Rebecca Hall returns today for follow-up and treatment of her estrogen receptor positive breast cancer. She continues on anastrozole, she tolerates well.  She is also on denosumab/Prolia every 6 months, with a dose due today.   Since her last visit she has had diagnostic mammogram breast bilateral with U/s of right breast on 05/04/18 that showed:Stable benign-appearing nodule in the 10 o'clock position of the right breast. No evidence of malignancy bilaterally. ACR Breast Density Category b: There are scattered areas of fibroglandular  density.  Fortunately her husband Rebecca Hall died 2 weeks ago in the hospital she is still recovering from this below.   REVIEW OF SYSTEMS: Rebecca Hall reports that for exercise, none. She denies unusual headaches, visual changes, nausea, vomiting, or dizziness. There has been no unusual cough, phlegm production, or pleurisy. This been no change in bowel or bladder habits. She denies unexplained fatigue or unexplained weight loss, bleeding, rash, or fever. A detailed review of systems was otherwise stable. ;   PAST MEDICAL HISTORY: Past Medical History:  Diagnosis Date  . Arthritis   . Breast cancer ([place]) 03/15/13   right, 12 o'clock  . Chronic kidney disease   . Diabetes mellitus without complication ([place])   . GERD (gastroesophageal reflux disease)   . History of lower GI bleeding   . Hyperkalemia 12/21/2015  . Hyperlipidemia   . Hypertension   . IBS (irritable bowel syndrome)   . Neuromuscular disorder ([place])    peripheral neuropathy feet  . Osteoporosis   . Personal history of chemotherapy     PAST SURGICAL HISTORY: Past Surgical History:  Procedure Laterality Date  . ABDOMINAL HYSTERECTOMY  1975   No salpingo-oophorectomy  . BREAST BIOPSY Right 2014   malignant  . BREAST BIOPSY Left 04/12/2013   benign x2  . BREAST LUMPECTOMY Right 2014  . BREAST LUMPECTOMY WITH NEEDLE LOCALIZATION AND AXILLARY SENTINEL LYMPH NODE BX Right 04/28/2013   Procedure: BREAST LUMPECTOMY WITH NEEDLE LOCALIZATION AND AXILLARY SENTINEL LYMPH NODE BX;  Surgeon: Rolm Bookbinder, MD;  Location: [facility];  Service: General;  Laterality: Right;  . CARDIAC CATHETERIZATION N/A 01/26/2015   Procedure: Right/Left Heart Cath and Coronary Angiography;  Surgeon: Shaune Pascal Bensimhon,  MD;  Location: [facility];  Service: Cardiovascular;  Laterality: N/A;  . CATARACT EXTRACTION  08-26-07   both  . COLONOSCOPY    . SKIN CANCER EXCISION     head, face, hand ..multiple years  . TONSILLECTOMY       FAMILY HISTORY Family History  Problem Relation Age of Onset  . Cancer Maternal Aunt 80       colon cancer  . Cancer Maternal Uncle 65       lung  . Cancer Maternal Grandmother        pancreas  . Cerebral aneurysm Father    the patient's father died at the age of 2 from a subarachnoid hemorrhage. The patient's mother lived to be and 33.36 years old. She lived independently to be and. The patient had one brother, no sisters. There is a history of colon cancer or in a maternal aunts, diagnosed at age 63, and lung cancer in a maternal uncle diagnosed at age 47. There is no history of breast or ovarian cancer in the family  GYNECOLOGIC HISTORY:  (updated December 2000 410)  Rebecca Hall can't remember when she had her first period. First live birth came at age 57 and she is [place] 2. She she took hormone replacement, namely estrogen, until August 25, 2012 when she was diagnosed with breast cancer.  SOCIAL HISTORY:  Updated November 2019. 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 Daughter Rebecca Hall is a retired Pharmacist, hospital living in [place]. Rebecca Hall's older son runs a Engineer, civil (consulting) and business in [place] and Rebecca Hall helps out with that. The patient's son Rebecca Hall lives in [place]. He is an Chief Financial Officer. Altogether Rebecca Hall has 4 grandsons, 3 great-grandsons and one great-granddaughter. Her husband died member 2017-08-25. He had been sick all year long before he died. She plans to live at her at her home for now but eventually moved to friend's homes   HEALTH MAINTENANCE:  (Updated January 2015) Social History   Tobacco Use  . Smoking status: Never Smoker  . Smokeless tobacco: Never Used  Substance Use Topics  . Alcohol use: No  . Drug use: No     Colonoscopy: 2009/08/25  PAP: Status post hysterectomy  Bone density:Dec Aug 25, 2012, [place], osteopenia with T score of -1.9  Lipid panel: Not on file/Dr. Maxwell Caul   Allergies  Allergen Reactions  . Levaquin [Levofloxacin  In D5w] Anaphylaxis and Nausea Only    Shaking real bad on medicaton  . Codeine Nausea And Vomiting    Pass out  . Herceptin [Trastuzumab] Rash    Patient has developed extensive body skin rash following each Herceptin treatment of increased presence following each treatment  that cleared with oral steroids  . Sulfa Antibiotics Swelling  . Tape Rash    Blisters     Current Outpatient Medications  Medication Sig Dispense Refill  . allopurinol (ZYLOPRIM) 300 MG tablet Take 300 mg by mouth daily.    Marland Kitchen amLODipine (NORVASC) 2.5 MG tablet TAKE ONE TABLET BY MOUTH ONE TIME DAILY  90 tablet 1  . anastrozole (ARIMIDEX) 1 MG tablet take 1 tablet by mouth once daily 90 tablet 0  . aspirin 81 MG tablet Take 81 mg by mouth at bedtime.     Marland Kitchen atorvastatin (LIPITOR) 20 MG tablet Take 20 mg by mouth every morning.     . calcium carbonate (OS-CAL) 600 MG TABS tablet Take 600 mg by mouth 2 (two) times daily with a meal.     .  carvedilol (COREG) 3.125 MG tablet TAKE ONE TABLET BY MOUTH TWICE DAILY  180 tablet 0  . denosumab (PROLIA) 60 MG/ML SOLN injection Inject 60 mg into the skin every 6 (six) months. Administer in upper arm, thigh, or abdomen    . Dulaglutide (TRULICITY) 2.33 KP/2.2ES SOPN Inject 0.75 mg into the skin once a week.    . fenofibrate (TRICOR) 48 MG tablet Take 48 mg by mouth daily.    . ferrous sulfate 325 (65 FE) MG EC tablet Take 325 mg by mouth daily with breakfast.    . furosemide (LASIX) 20 MG tablet TAKE 40MG EVERY MORNING AND AN ADDITIONAL 20MG EVERY EVENING AS NEEDED FOR SWELLING 270 tablet 2  . gabapentin (NEURONTIN) 300 MG capsule TAKE ONE CAPSULE BY MOUTH AT BEDTIME  90 capsule 0  . glimepiride (AMARYL) 2 MG tablet Take 2 mg by mouth daily with breakfast.    . loratadine (CLARITIN) 10 MG tablet Take 10 mg by mouth every morning.     . Melatonin 5 MG TABS Take 5 mg by mouth at bedtime as needed (As needed for sleep).    . minoxidil (ROGAINE) 2 % external solution Apply 1  application topically 3 (three) times a week.     . Multiple Vitamin (MULTIVITAMIN WITH MINERALS) TABS tablet Take 1 tablet by mouth 2 (two) times daily.     Marland Kitchen omeprazole (PRILOSEC) 20 MG capsule Take 20 mg by mouth daily.     . potassium chloride (K-DUR,KLOR-CON) 10 MEQ tablet TAKE 2 TABLETS (20 MEQ TOTAL) BY MOUTH 2 (TWO) TIMES DAILY. 120 tablet 2  . traMADol (ULTRAM) 50 MG tablet Take 50 mg by mouth every 6 (six) hours as needed for moderate pain.     . Vitamin D, Ergocalciferol, (DRISDOL) 50000 UNITS CAPS capsule Take 50,000 Units by mouth every 14 (fourteen) days.     . vitamin E 100 UNIT capsule Take 100 Units by mouth daily.     No current facility-administered medications for this visit.     OBJECTIVE: Older white woman who appears stated age  84:   05/11/18 1333  BP: (!) 156/60  Pulse: 66  Resp: 18  Temp: 97.6 F (36.4 C)  SpO2: 97%     Body mass index is 29.25 kg/m.    ECOG FS:1 - Symptomatic but completely ambulatory Filed Weights   05/11/18 1333  Weight: 154 lb 12.8 oz (70.2 kg)    Sclerae unicteric, pupils round and equal No cervical or supraclavicular adenopathy Lungs no rales or rhonchi Heart regular rate and rhythm Abd soft, nontender, positive bowel sounds MSK kyphosis but no focal spinal tenderness, no upper extremity lymphedema Neuro: nonfocal, well oriented, appropriate affect Breasts: The right breast has undergone lumpectomy.  There is no evidence of disease recurrence.  The left breast is benign.  Both axillae are benign.  LAB RESULTS:   Lab Results  Component Value Date   WBC 2.7 (L) 05/11/2018   NEUTROABS 1.1 (L) 05/11/2018   HGB 11.6 (L) 05/11/2018   HCT 35.2 (L) 05/11/2018   MCV 100.0 05/11/2018   PLT 165 05/11/2018      Chemistry      Component Value Date/Time   NA 141 05/11/2018 1311   NA 136 05/12/2017 1350   K 3.3 (L) 05/11/2018 1311   K 3.9 05/12/2017 1350   CL 103 05/11/2018 1311   CO2 26 05/11/2018 1311   CO2 27  05/12/2017 1350   BUN 26 (H) 05/11/2018 1311  BUN 29.2 (H) 05/12/2017 1350   CREATININE 1.79 (H) 05/11/2018 1311   CREATININE 1.7 (H) 05/12/2017 1350      Component Value Date/Time   CALCIUM 9.4 05/11/2018 1311   CALCIUM 10.2 05/12/2017 1350   ALKPHOS 39 05/11/2018 1311   ALKPHOS 48 05/12/2017 1350   AST 25 05/11/2018 1311   AST 28 05/12/2017 1350   ALT 25 05/11/2018 1311   ALT 37 05/12/2017 1350   BILITOT 0.5 05/11/2018 1311   BILITOT 0.46 05/12/2017 1350       STUDIES: Recent mammography results discussed  ASSESSMENT: 84 y.o. [place] woman, status post right breast upper-outer quadrant biopsy 03/15/2013 for a clinical T1c N0, stage IA invasive ductal carcinoma, grade 1 or 2, estrogen receptor 100% positive, progesterone receptor 100% positive, with an MIB-1 of 30%, and HER-2 amplified by CISH, with a ratio of 3.51 and an average HER-2 copy number Damita Lack of 6.85  (1) biopsy of 2 suspicious areas in the left breast both showed no evidence of malignancy  (2) right lumpectomy and sentinel lymph node sampling 04/28/2013 showed a pT1b pN0, stage IA invasive ductal carcinoma, grade 2, with negative margins  (a) did not need radiation therapy  (3) trastuzumab started 05/31/2013, given every 3 weeks for total of 9 doses; held as of 07/16/2013 after 3 doses because of possible allergic dermatitis associated with the drug.   (4) anastrozole started 05/08/2013, and 5 years in November 2019  (a) dexa scan 06/10/2013 showed osteopenia with a T score of -2.1  (b) zolendronate started 09/13/2013, repeated 04/17/2015  (c) switched to denosumab/Prolia as of April 2017  (5) congestive heart failure in the setting of sepsis August 2016, with normal repeat echo December 2016   PLAN: Phoebie is now 5 years out from definitive surgery for her breast cancer with no evidence of disease recurrence.  This is very favorable.  We discussed the fact that continuing anastrozole for another 2 years  would affect her risk of recurrence by 1% or less.  Accordingly we are stopping that medication now.  She will also receive her last dose of denosumab/Prolia today.  At this point I am comfortable releasing her back to her primary care physicians care.  All she will need in terms of breast cancer follow-up is a yearly mammogram and a yearly physician breast exam  We discussed fall prevention strategies today and I recommended she start physical therapy through [facility] to improve balance issues  We also discussed the survivorship program here.  She is very interested in participating so I made her an appointment for survivorship in a year  I will be glad to see Rebecca Hall at any point in the future if and when the need arises but as of now are making no further routine appointments for her here.   , Virgie Dad, MD  05/11/18 1:52 PM Medical Oncology and Hematology [facility] [address] [place], [place] [address] Tel. [phone]    Fax. [phone]    Elie Goody, am acting as scribe for Dr. Virgie Dad. .  I, Lurline Del MD, have reviewed the above documentation for accuracy and completeness, and I agree with the above.

## 2018-05-11 ENCOUNTER — Other Ambulatory Visit: Payer: Self-pay | Admitting: *Deleted

## 2018-05-11 ENCOUNTER — Inpatient Hospital Stay: Payer: PPO

## 2018-05-11 ENCOUNTER — Inpatient Hospital Stay: Payer: PPO | Attending: Oncology | Admitting: Oncology

## 2018-05-11 ENCOUNTER — Telehealth: Payer: Self-pay | Admitting: Oncology

## 2018-05-11 VITALS — BP 156/60 | HR 66 | Temp 97.6°F | Resp 18 | Ht 61.0 in | Wt 154.8 lb

## 2018-05-11 DIAGNOSIS — Z9221 Personal history of antineoplastic chemotherapy: Secondary | ICD-10-CM

## 2018-05-11 DIAGNOSIS — Z9223 Personal history of estrogen therapy: Secondary | ICD-10-CM | POA: Insufficient documentation

## 2018-05-11 DIAGNOSIS — C50411 Malignant neoplasm of upper-outer quadrant of right female breast: Secondary | ICD-10-CM

## 2018-05-11 DIAGNOSIS — Z170 Estrogen receptor positive status [ER+]: Secondary | ICD-10-CM | POA: Insufficient documentation

## 2018-05-11 DIAGNOSIS — Z853 Personal history of malignant neoplasm of breast: Secondary | ICD-10-CM | POA: Diagnosis not present

## 2018-05-11 LAB — CMP (CANCER CENTER ONLY)
ALT: 25 U/L (ref 0–44)
AST: 25 U/L (ref 15–41)
Albumin: 3.8 g/dL (ref 3.5–5.0)
Alkaline Phosphatase: 39 U/L (ref 38–126)
Anion gap: 12 (ref 5–15)
BUN: 26 mg/dL — ABNORMAL HIGH (ref 8–23)
CO2: 26 mmol/L (ref 22–32)
Calcium: 9.4 mg/dL (ref 8.9–10.3)
Chloride: 103 mmol/L (ref 98–111)
Creatinine: 1.79 mg/dL — ABNORMAL HIGH (ref 0.44–1.00)
GFR, Est AFR Am: 29 mL/min — ABNORMAL LOW
GFR, Estimated: 25 mL/min — ABNORMAL LOW
Glucose, Bld: 270 mg/dL — ABNORMAL HIGH (ref 70–99)
Potassium: 3.3 mmol/L — ABNORMAL LOW (ref 3.5–5.1)
Sodium: 141 mmol/L (ref 135–145)
Total Bilirubin: 0.5 mg/dL (ref 0.3–1.2)
Total Protein: 6.8 g/dL (ref 6.5–8.1)

## 2018-05-11 LAB — CBC WITH DIFFERENTIAL (CANCER CENTER ONLY)
Abs Immature Granulocytes: 0.01 10*3/uL (ref 0.00–0.07)
Basophils Absolute: 0 10*3/uL (ref 0.0–0.1)
Basophils Relative: 0 %
Eosinophils Absolute: 0.1 10*3/uL (ref 0.0–0.5)
Eosinophils Relative: 4 %
HCT: 35.2 % — ABNORMAL LOW (ref 36.0–46.0)
Hemoglobin: 11.6 g/dL — ABNORMAL LOW (ref 12.0–15.0)
Immature Granulocytes: 0 %
Lymphocytes Relative: 46 %
Lymphs Abs: 1.3 10*3/uL (ref 0.7–4.0)
MCH: 33 pg (ref 26.0–34.0)
MCHC: 33 g/dL (ref 30.0–36.0)
MCV: 100 fL (ref 80.0–100.0)
Monocytes Absolute: 0.2 10*3/uL (ref 0.1–1.0)
Monocytes Relative: 8 %
Neutro Abs: 1.1 10*3/uL — ABNORMAL LOW (ref 1.7–7.7)
Neutrophils Relative %: 42 %
Platelet Count: 165 10*3/uL (ref 150–400)
RBC: 3.52 MIL/uL — ABNORMAL LOW (ref 3.87–5.11)
RDW: 13.1 % (ref 11.5–15.5)
WBC Count: 2.7 10*3/uL — ABNORMAL LOW (ref 4.0–10.5)
nRBC: 0 % (ref 0.0–0.2)

## 2018-05-11 MED ORDER — DENOSUMAB 60 MG/ML ~~LOC~~ SOSY
60.0000 mg | PREFILLED_SYRINGE | Freq: Once | SUBCUTANEOUS | Status: AC
  Administered 2018-05-11: 60 mg via SUBCUTANEOUS

## 2018-05-11 NOTE — Telephone Encounter (Signed)
Gave patient avs and calendar.   °

## 2018-05-25 ENCOUNTER — Other Ambulatory Visit (HOSPITAL_COMMUNITY): Payer: Self-pay | Admitting: Internal Medicine

## 2018-05-25 DIAGNOSIS — I5022 Chronic systolic (congestive) heart failure: Secondary | ICD-10-CM

## 2018-06-03 ENCOUNTER — Other Ambulatory Visit (HOSPITAL_COMMUNITY): Payer: Self-pay | Admitting: Internal Medicine

## 2018-08-16 ENCOUNTER — Other Ambulatory Visit (HOSPITAL_COMMUNITY): Payer: Self-pay | Admitting: Internal Medicine

## 2018-08-16 DIAGNOSIS — I5022 Chronic systolic (congestive) heart failure: Secondary | ICD-10-CM

## 2018-10-06 ENCOUNTER — Other Ambulatory Visit (HOSPITAL_COMMUNITY): Payer: Self-pay | Admitting: Cardiology

## 2018-12-14 ENCOUNTER — Other Ambulatory Visit (HOSPITAL_COMMUNITY): Payer: Self-pay | Admitting: Internal Medicine

## 2018-12-17 DIAGNOSIS — Z0000 Encounter for general adult medical examination without abnormal findings: Secondary | ICD-10-CM | POA: Diagnosis not present

## 2018-12-17 DIAGNOSIS — I700 Atherosclerosis of aorta: Secondary | ICD-10-CM | POA: Diagnosis not present

## 2018-12-17 DIAGNOSIS — Z79899 Other long term (current) drug therapy: Secondary | ICD-10-CM | POA: Diagnosis not present

## 2018-12-17 DIAGNOSIS — E1142 Type 2 diabetes mellitus with diabetic polyneuropathy: Secondary | ICD-10-CM | POA: Diagnosis not present

## 2018-12-17 DIAGNOSIS — E1122 Type 2 diabetes mellitus with diabetic chronic kidney disease: Secondary | ICD-10-CM | POA: Diagnosis not present

## 2018-12-17 DIAGNOSIS — I129 Hypertensive chronic kidney disease with stage 1 through stage 4 chronic kidney disease, or unspecified chronic kidney disease: Secondary | ICD-10-CM | POA: Diagnosis not present

## 2018-12-17 DIAGNOSIS — J440 Chronic obstructive pulmonary disease with acute lower respiratory infection: Secondary | ICD-10-CM | POA: Diagnosis not present

## 2018-12-17 DIAGNOSIS — M810 Age-related osteoporosis without current pathological fracture: Secondary | ICD-10-CM | POA: Diagnosis not present

## 2018-12-17 DIAGNOSIS — E782 Mixed hyperlipidemia: Secondary | ICD-10-CM | POA: Diagnosis not present

## 2018-12-17 DIAGNOSIS — N183 Chronic kidney disease, stage 3 (moderate): Secondary | ICD-10-CM | POA: Diagnosis not present

## 2018-12-17 DIAGNOSIS — Z1389 Encounter for screening for other disorder: Secondary | ICD-10-CM | POA: Diagnosis not present

## 2018-12-18 DIAGNOSIS — M79671 Pain in right foot: Secondary | ICD-10-CM | POA: Diagnosis not present

## 2018-12-18 DIAGNOSIS — B351 Tinea unguium: Secondary | ICD-10-CM | POA: Diagnosis not present

## 2018-12-18 DIAGNOSIS — E119 Type 2 diabetes mellitus without complications: Secondary | ICD-10-CM | POA: Diagnosis not present

## 2018-12-18 DIAGNOSIS — L84 Corns and callosities: Secondary | ICD-10-CM | POA: Diagnosis not present

## 2018-12-18 DIAGNOSIS — M79672 Pain in left foot: Secondary | ICD-10-CM | POA: Diagnosis not present

## 2019-01-03 ENCOUNTER — Other Ambulatory Visit (HOSPITAL_COMMUNITY): Payer: Self-pay | Admitting: Cardiology

## 2019-01-10 ENCOUNTER — Other Ambulatory Visit (HOSPITAL_COMMUNITY): Payer: Self-pay | Admitting: Internal Medicine

## 2019-01-31 ENCOUNTER — Other Ambulatory Visit (HOSPITAL_COMMUNITY): Payer: Self-pay | Admitting: Internal Medicine

## 2019-01-31 DIAGNOSIS — I5022 Chronic systolic (congestive) heart failure: Secondary | ICD-10-CM

## 2019-02-04 DIAGNOSIS — I129 Hypertensive chronic kidney disease with stage 1 through stage 4 chronic kidney disease, or unspecified chronic kidney disease: Secondary | ICD-10-CM | POA: Diagnosis not present

## 2019-02-04 DIAGNOSIS — N183 Chronic kidney disease, stage 3 (moderate): Secondary | ICD-10-CM | POA: Diagnosis not present

## 2019-02-04 DIAGNOSIS — E1142 Type 2 diabetes mellitus with diabetic polyneuropathy: Secondary | ICD-10-CM | POA: Diagnosis not present

## 2019-02-04 DIAGNOSIS — D509 Iron deficiency anemia, unspecified: Secondary | ICD-10-CM | POA: Diagnosis not present

## 2019-02-04 DIAGNOSIS — E782 Mixed hyperlipidemia: Secondary | ICD-10-CM | POA: Diagnosis not present

## 2019-02-04 DIAGNOSIS — E1122 Type 2 diabetes mellitus with diabetic chronic kidney disease: Secondary | ICD-10-CM | POA: Diagnosis not present

## 2019-02-04 DIAGNOSIS — M810 Age-related osteoporosis without current pathological fracture: Secondary | ICD-10-CM | POA: Diagnosis not present

## 2019-02-04 DIAGNOSIS — J440 Chronic obstructive pulmonary disease with acute lower respiratory infection: Secondary | ICD-10-CM | POA: Diagnosis not present

## 2019-02-26 DIAGNOSIS — M79672 Pain in left foot: Secondary | ICD-10-CM | POA: Diagnosis not present

## 2019-02-26 DIAGNOSIS — L84 Corns and callosities: Secondary | ICD-10-CM | POA: Diagnosis not present

## 2019-02-26 DIAGNOSIS — M79671 Pain in right foot: Secondary | ICD-10-CM | POA: Diagnosis not present

## 2019-02-26 DIAGNOSIS — E119 Type 2 diabetes mellitus without complications: Secondary | ICD-10-CM | POA: Diagnosis not present

## 2019-02-26 DIAGNOSIS — B351 Tinea unguium: Secondary | ICD-10-CM | POA: Diagnosis not present

## 2019-03-01 NOTE — Progress Notes (Signed)
Advanced Heart Failure Clinic Note   Patient ID: Rebecca Hall, female   DOB: Apr 13, 1932, 85 y.o.   MRN: [id number] PCP: Dr Felipa Eth.  Primary HF: Dr Haroldine Laws Oncologist: Dr Jana Hakim  HPI: Rebecca Hall is a 85 y.o. female with history of Breast Cancer treated with 3 doses of herceptin in 2014, CKD, HTN, HL, GERD, Hx of GI Bleed, and DM.  Admitted to [facility] 8/16 with hypovolemic shock, AKI creatinine 4.7. After fluid resuscitation developed pulmonary edema. Found to have new onset LV dysfunction with EF 30-35% in 8/16.. Had cath as noted below with 1v CAD with 60% proximal LAD stenosis and NICM. Discharged on lasix, carvedilol, and Entresto. Discharge weight was 156 pounds. Echo 12/16 EF 55-60%,   Admitted 06/30/15 with weakness and dizziness and found to have recurrent AKI, hyperkalemia, and hypotension. (Creatinine 3.69 and K 7.4 on admit) Recovered with IVF.   Echo 7/18: EF 55-60%  She presents today for regular follow up. Her husband passed away in 06/03/18 from [place] and pulmonary fibrosis. [facility]. Has not been able to see or visit anyone.  Has mild swelling in LE at night but goes away in morning.  Denies CP. Has chronic SOB with moderate exertion. Lasix dropped from 40 daily to 20 daily but swelling increased. Now on 20 bid.   Cardiac studies:  [facility] 01/27/2015  RA = 11 RV = 42/8/15 PA = 38/8 (24) PCW = 19 Fick cardiac output/index = 5.2/3.0 Thermo CO/CI = 5.7/3.2 PVR = 1.0 WU FA sat = 93% PA sat = 51%, 51% Ao Pressure: 133/60 (85) LV Pressure: 133/9/17 No aortic stenosis  Assessment: 1) Moderate 1v CAD with 60% proximal LAD lesion 2) Non-ischemic CM with EF 30-35% by echo 3) Mildly elevated R-sided pressures with normal cardiac output   Review of systems complete and found to be negative unless listed in HPI.    SH:  Social History   Socioeconomic History  . Marital status: Married    Spouse name: Not on file  . Number of children: Not on  file  . Years of education: Not on file  . Highest education level: Not on file  Occupational History  . Not on file  Social Needs  . Financial resource strain: Not on file  . Food insecurity    Worry: Not on file    Inability: Not on file  . Transportation needs    Medical: Not on file    Non-medical: Not on file  Tobacco Use  . Smoking status: Never Smoker  . Smokeless tobacco: Never Used  Substance and Sexual Activity  . Alcohol use: No  . Drug use: No  . Sexual activity: Not on file    Comment: first live birth age 68, P 2, Estrogen   Lifestyle  . Physical activity    Days per week: Not on file    Minutes per session: Not on file  . Stress: Not on file  Relationships  . Social Herbalist on phone: Not on file    Gets together: Not on file    Attends religious service: Not on file    Active member of club or organization: Not on file    Attends meetings of clubs or organizations: Not on file    Relationship status: Not on file  . Intimate partner violence    Fear of current or ex partner: Not on file    Emotionally abused: Not on file  Physically abused: Not on file    Forced sexual activity: Not on file  Other Topics Concern  . Not on file  Social History Narrative  . Not on file    FH:  Family History  Problem Relation Age of Onset  . Cancer Maternal Aunt 80       colon cancer  . Cancer Maternal Uncle 65       lung  . Cancer Maternal Grandmother        pancreas  . Cerebral aneurysm Father     Past Medical History:  Diagnosis Date  . Arthritis   . Breast cancer ([place]) 03/15/13   right, 12 o'clock  . Chronic kidney disease   . Diabetes mellitus without complication ([place])   . GERD (gastroesophageal reflux disease)   . History of lower GI bleeding   . Hyperkalemia 12/21/2015  . Hyperlipidemia   . Hypertension   . IBS (irritable bowel syndrome)   . Neuromuscular disorder ([place])    peripheral neuropathy feet  . Osteoporosis   . Personal  history of chemotherapy     Current Outpatient Medications  Medication Sig Dispense Refill  . allopurinol (ZYLOPRIM) 300 MG tablet Take 300 mg by mouth daily.    Marland Kitchen amLODipine (NORVASC) 2.5 MG tablet TAKE ONE TABLET BY MOUTH ONE TIME DAILY  90 tablet 0  . anastrozole (ARIMIDEX) 1 MG tablet take 1 tablet by mouth once daily 90 tablet 0  . aspirin 81 MG tablet Take 81 mg by mouth at bedtime.     Marland Kitchen atorvastatin (LIPITOR) 20 MG tablet Take 20 mg by mouth every morning.     . calcium carbonate (OS-CAL) 600 MG TABS tablet Take 600 mg by mouth 2 (two) times daily with a meal.     . carvedilol (COREG) 3.125 MG tablet TAKE ONE TABLET BY MOUTH TWICE DAILY  180 tablet 0  . denosumab (PROLIA) 60 MG/ML SOLN injection Inject 60 mg into the skin every 6 (six) months. Administer in upper arm, thigh, or abdomen    . Dulaglutide (TRULICITY) 3.29 JJ/8.8CZ SOPN Inject 0.75 mg into the skin once a week.    . fenofibrate (TRICOR) 48 MG tablet Take 48 mg by mouth daily.    . ferrous sulfate 325 (65 FE) MG EC tablet Take 325 mg by mouth daily with breakfast.    . furosemide (LASIX) 20 MG tablet TAKE 2 TABLETS BY MOUTH EVERY MORNING AND 1 ADDITIONAL TABLET IN THE EVENING AS NEEDED FOR SWELLING 270 tablet 0  . gabapentin (NEURONTIN) 300 MG capsule TAKE ONE CAPSULE BY MOUTH AT BEDTIME  90 capsule 0  . glimepiride (AMARYL) 2 MG tablet Take 2 mg by mouth daily with breakfast.    . loratadine (CLARITIN) 10 MG tablet Take 10 mg by mouth every morning.     . Melatonin 5 MG TABS Take 5 mg by mouth at bedtime as needed (As needed for sleep).    . minoxidil (ROGAINE) 2 % external solution Apply 1 application topically 3 (three) times a week.     . Multiple Vitamin (MULTIVITAMIN WITH MINERALS) TABS tablet Take 1 tablet by mouth 2 (two) times daily.     Marland Kitchen omeprazole (PRILOSEC) 20 MG capsule Take 20 mg by mouth daily.     . potassium chloride (K-DUR) 10 MEQ tablet TAKE TWO TABLETS BY MOUTH TWICE DAILY  120 tablet 0  . traMADol  (ULTRAM) 50 MG tablet Take 50 mg by mouth every 6 (six) hours as needed for  moderate pain.     . Vitamin D, Ergocalciferol, (DRISDOL) 50000 UNITS CAPS capsule Take 50,000 Units by mouth every 14 (fourteen) days.     . vitamin E 100 UNIT capsule Take 100 Units by mouth daily.     No current facility-administered medications for this encounter.     Vitals:   03/02/19 1529  BP: 124/60  Pulse: 70  SpO2: 96%  Weight: 71.8 kg (158 lb 3.2 oz)    Wt Readings from Last 3 Encounters:  05/11/18 70.2 kg (154 lb 12.8 oz)  04/13/18 72 kg (158 lb 12.8 oz)  05/12/17 73.6 kg (162 lb 4.8 oz)    PHYSICAL EXAM:  General: Elderly  No resp difficulty. HEENT: normal Neck: supple. no JVD. Carotids 2+ bilat; no bruits. No lymphadenopathy or thryomegaly appreciated. Cor: PMI nondisplaced. Regular rate & rhythm. No rubs, gallops or murmurs. Lungs: clear Abdomen: soft, nontender, nondistended. No hepatosplenomegaly. No bruits or masses. Good bowel sounds. Extremities: no cyanosis, clubbing, rash, tr-1+  Edema + varicose veins Neuro: alert & orientedx3, cranial nerves grossly intact. moves all 4 extremities w/o difficulty. Affect pleasant   ASSESSMENT & PLAN: 1. Chronic diastolic CHF:  - She has history of nonischemic cardiomyopathy (EF 30-35% in 8/16). EF now recovered.  - Echo 12/2016 EF 55-60% Normal LV and RV, mild LVH.  - Stable NYHA II - Volume status looks good overall. Has mild peripheral edema likely due to venous insufficiency. Encouraged her to wear compression stockings.  - Continue lasix 20 mg bid - Continue carvedilol 3.125 mg BID - Off lisinopril.  Would not re-challenge given improvement in LV systolic function.  - Reinforced fluid restriction to < 2 L daily, sodium restriction to less than 2000 mg daily, and the importance of daily weights.   - BMET today 2. CKD: Stage 3 - Baseline creatinine 1.5-1.8. Followed by Dr. Felipa Eth 3. Hx of Breast Cancer: Only received 3 doses of  Herceptin in 2014. Stopped due to possible allergy. 4. DM2 - stable - GFR < 30 so no SGLT2i 5. HTN:  -Blood pressure well controlled. Continue current regimen. -Goal SBP 125-140.  6. HLD:  - Continue lipitor 20 mg daily 7. Heavy snoring:  - Has refused sleep study.  8. CAD:  - Nonobstructive on prior cath. 8/19 LAD 60% mid - No s/s of ischemia - Last LDL was 90. Goal LDL < 70 - Increase lipitor form 20-> 40 mg daily - ContinueASA 81 mg daily.   Glori Bickers, MD  3:48 PM

## 2019-03-02 ENCOUNTER — Other Ambulatory Visit: Payer: Self-pay

## 2019-03-02 ENCOUNTER — Ambulatory Visit (HOSPITAL_COMMUNITY)
Admission: RE | Admit: 2019-03-02 | Discharge: 2019-03-02 | Disposition: A | Discharge status: Home / Self Care | Payer: PPO | Source: Ambulatory Visit | Attending: Internal Medicine | Admitting: Internal Medicine

## 2019-03-02 ENCOUNTER — Encounter (HOSPITAL_COMMUNITY): Payer: Self-pay | Admitting: Internal Medicine

## 2019-03-02 VITALS — BP 124/60 | HR 70 | Wt 158.2 lb

## 2019-03-02 DIAGNOSIS — Z853 Personal history of malignant neoplasm of breast: Secondary | ICD-10-CM | POA: Diagnosis not present

## 2019-03-02 DIAGNOSIS — Z79899 Other long term (current) drug therapy: Secondary | ICD-10-CM | POA: Diagnosis not present

## 2019-03-02 DIAGNOSIS — I2510 Atherosclerotic heart disease of native coronary artery without angina pectoris: Secondary | ICD-10-CM | POA: Insufficient documentation

## 2019-03-02 DIAGNOSIS — I130 Hypertensive heart and chronic kidney disease with heart failure and stage 1 through stage 4 chronic kidney disease, or unspecified chronic kidney disease: Secondary | ICD-10-CM | POA: Diagnosis not present

## 2019-03-02 DIAGNOSIS — M1990 Unspecified osteoarthritis, unspecified site: Secondary | ICD-10-CM | POA: Insufficient documentation

## 2019-03-02 DIAGNOSIS — K589 Irritable bowel syndrome without diarrhea: Secondary | ICD-10-CM | POA: Insufficient documentation

## 2019-03-02 DIAGNOSIS — E785 Hyperlipidemia, unspecified: Secondary | ICD-10-CM | POA: Diagnosis not present

## 2019-03-02 DIAGNOSIS — Z7984 Long term (current) use of oral hypoglycemic drugs: Secondary | ICD-10-CM | POA: Diagnosis not present

## 2019-03-02 DIAGNOSIS — E1122 Type 2 diabetes mellitus with diabetic chronic kidney disease: Secondary | ICD-10-CM | POA: Insufficient documentation

## 2019-03-02 DIAGNOSIS — I10 Essential (primary) hypertension: Secondary | ICD-10-CM

## 2019-03-02 DIAGNOSIS — K219 Gastro-esophageal reflux disease without esophagitis: Secondary | ICD-10-CM | POA: Insufficient documentation

## 2019-03-02 DIAGNOSIS — N183 Chronic kidney disease, stage 3 (moderate): Secondary | ICD-10-CM | POA: Insufficient documentation

## 2019-03-02 DIAGNOSIS — Z7982 Long term (current) use of aspirin: Secondary | ICD-10-CM | POA: Diagnosis not present

## 2019-03-02 DIAGNOSIS — I428 Other cardiomyopathies: Secondary | ICD-10-CM | POA: Insufficient documentation

## 2019-03-02 DIAGNOSIS — I5022 Chronic systolic (congestive) heart failure: Secondary | ICD-10-CM

## 2019-03-02 DIAGNOSIS — I5032 Chronic diastolic (congestive) heart failure: Secondary | ICD-10-CM | POA: Diagnosis not present

## 2019-03-02 LAB — BASIC METABOLIC PANEL
Anion gap: 11 (ref 5–15)
BUN: 15 mg/dL (ref 8–23)
CO2: 24 mmol/L (ref 22–32)
Calcium: 8.8 mg/dL — ABNORMAL LOW (ref 8.9–10.3)
Chloride: 105 mmol/L (ref 98–111)
Creatinine, Ser: 1.27 mg/dL — ABNORMAL HIGH (ref 0.44–1.00)
GFR calc Af Amer: 45 mL/min — ABNORMAL LOW (ref >60)
GFR calc non Af Amer: 38 mL/min — ABNORMAL LOW (ref >60)
Glucose, Bld: 86 mg/dL (ref 70–99)
Potassium: 3.4 mmol/L — ABNORMAL LOW (ref 3.5–5.1)
Sodium: 140 mmol/L (ref 135–145)

## 2019-03-02 MED ORDER — ATORVASTATIN CALCIUM 40 MG PO TABS: 40.0000 mg | ORAL_TABLET | Freq: Every morning | ORAL | 11 refills | Status: DC

## 2019-03-02 NOTE — Patient Instructions (Signed)
Increase Atorvastatin to 40 mg daily  Please wear compression hose daily, place them on as soon as you get up in the morning and remove before going to bed  Labs done today  Please call our office in May to schedule your follow up appointment for June 2021.

## 2019-03-02 NOTE — Addendum Note (Signed)
Encounter addended by: Scarlette Calico, RN on: 03/02/2019 3:55 PM  Actions taken: Order list changed, Diagnosis association updated, Clinical Note Signed

## 2019-03-03 ENCOUNTER — Telehealth (HOSPITAL_COMMUNITY): Payer: Self-pay

## 2019-03-03 NOTE — Telephone Encounter (Addendum)
Pt stated that she was not on potassium as prescribed. Asked pt to take as originally prescribed 40 meq with labs in 1 week. Pt agreeable and appt made. Made MD aware.

## 2019-03-03 NOTE — Telephone Encounter (Signed)
-----   Message from Jolaine Artist, MD sent at 03/03/2019  3:24 PM EDT ----- Potassium is low. Is she taking her 40 meq daily? If so, please have her take 40 meq extra now and increase to 60 daily. Repeat BMET 1 week.

## 2019-03-11 ENCOUNTER — Ambulatory Visit (HOSPITAL_COMMUNITY)
Admission: RE | Admit: 2019-03-11 | Discharge: 2019-03-11 | Disposition: A | Discharge status: Home / Self Care | Payer: PPO | Source: Ambulatory Visit | Attending: Cardiology | Admitting: Cardiology

## 2019-03-11 ENCOUNTER — Other Ambulatory Visit: Payer: Self-pay

## 2019-03-11 DIAGNOSIS — I5032 Chronic diastolic (congestive) heart failure: Secondary | ICD-10-CM | POA: Insufficient documentation

## 2019-03-11 LAB — BASIC METABOLIC PANEL
Anion gap: 13 (ref 5–15)
BUN: 28 mg/dL — ABNORMAL HIGH (ref 8–23)
CO2: 24 mmol/L (ref 22–32)
Calcium: 9.3 mg/dL (ref 8.9–10.3)
Chloride: 99 mmol/L (ref 98–111)
Creatinine, Ser: 1.73 mg/dL — ABNORMAL HIGH (ref 0.44–1.00)
GFR calc Af Amer: 31 mL/min — ABNORMAL LOW (ref >60)
GFR calc non Af Amer: 26 mL/min — ABNORMAL LOW (ref >60)
Glucose, Bld: 205 mg/dL — ABNORMAL HIGH (ref 70–99)
Potassium: 4.2 mmol/L (ref 3.5–5.1)
Sodium: 136 mmol/L (ref 135–145)

## 2019-03-15 ENCOUNTER — Other Ambulatory Visit (HOSPITAL_COMMUNITY): Payer: Self-pay | Admitting: Internal Medicine

## 2019-03-24 DIAGNOSIS — N183 Chronic kidney disease, stage 3 (moderate): Secondary | ICD-10-CM | POA: Diagnosis not present

## 2019-03-24 DIAGNOSIS — M810 Age-related osteoporosis without current pathological fracture: Secondary | ICD-10-CM | POA: Diagnosis not present

## 2019-03-24 DIAGNOSIS — E1142 Type 2 diabetes mellitus with diabetic polyneuropathy: Secondary | ICD-10-CM | POA: Diagnosis not present

## 2019-03-24 DIAGNOSIS — E1122 Type 2 diabetes mellitus with diabetic chronic kidney disease: Secondary | ICD-10-CM | POA: Diagnosis not present

## 2019-03-24 DIAGNOSIS — J440 Chronic obstructive pulmonary disease with acute lower respiratory infection: Secondary | ICD-10-CM | POA: Diagnosis not present

## 2019-03-24 DIAGNOSIS — E782 Mixed hyperlipidemia: Secondary | ICD-10-CM | POA: Diagnosis not present

## 2019-03-24 DIAGNOSIS — I129 Hypertensive chronic kidney disease with stage 1 through stage 4 chronic kidney disease, or unspecified chronic kidney disease: Secondary | ICD-10-CM | POA: Diagnosis not present

## 2019-03-24 DIAGNOSIS — D509 Iron deficiency anemia, unspecified: Secondary | ICD-10-CM | POA: Diagnosis not present

## 2019-03-29 ENCOUNTER — Other Ambulatory Visit (HOSPITAL_COMMUNITY): Payer: Self-pay

## 2019-03-29 MED ORDER — AMLODIPINE BESYLATE 2.5 MG PO TABS: 2.5000 mg | ORAL_TABLET | Freq: Every day | ORAL | 0 refills | Status: DC

## 2019-03-30 DIAGNOSIS — E782 Mixed hyperlipidemia: Secondary | ICD-10-CM | POA: Diagnosis not present

## 2019-03-30 DIAGNOSIS — J440 Chronic obstructive pulmonary disease with acute lower respiratory infection: Secondary | ICD-10-CM | POA: Diagnosis not present

## 2019-03-30 DIAGNOSIS — I129 Hypertensive chronic kidney disease with stage 1 through stage 4 chronic kidney disease, or unspecified chronic kidney disease: Secondary | ICD-10-CM | POA: Diagnosis not present

## 2019-03-30 DIAGNOSIS — E1122 Type 2 diabetes mellitus with diabetic chronic kidney disease: Secondary | ICD-10-CM | POA: Diagnosis not present

## 2019-03-30 DIAGNOSIS — I5032 Chronic diastolic (congestive) heart failure: Secondary | ICD-10-CM | POA: Diagnosis not present

## 2019-03-30 DIAGNOSIS — E1142 Type 2 diabetes mellitus with diabetic polyneuropathy: Secondary | ICD-10-CM | POA: Diagnosis not present

## 2019-03-30 DIAGNOSIS — N1831 Chronic kidney disease, stage 3a: Secondary | ICD-10-CM | POA: Diagnosis not present

## 2019-03-30 DIAGNOSIS — M810 Age-related osteoporosis without current pathological fracture: Secondary | ICD-10-CM | POA: Diagnosis not present

## 2019-03-30 DIAGNOSIS — D509 Iron deficiency anemia, unspecified: Secondary | ICD-10-CM | POA: Diagnosis not present

## 2019-04-05 ENCOUNTER — Other Ambulatory Visit: Payer: Self-pay | Admitting: Oncology

## 2019-04-05 DIAGNOSIS — Z1231 Encounter for screening mammogram for malignant neoplasm of breast: Secondary | ICD-10-CM

## 2019-04-09 ENCOUNTER — Other Ambulatory Visit (HOSPITAL_COMMUNITY): Payer: Self-pay | Admitting: Cardiology

## 2019-04-20 DIAGNOSIS — N1832 Chronic kidney disease, stage 3b: Secondary | ICD-10-CM | POA: Diagnosis not present

## 2019-04-20 DIAGNOSIS — E1122 Type 2 diabetes mellitus with diabetic chronic kidney disease: Secondary | ICD-10-CM | POA: Diagnosis not present

## 2019-04-20 DIAGNOSIS — Z79899 Other long term (current) drug therapy: Secondary | ICD-10-CM | POA: Diagnosis not present

## 2019-04-20 DIAGNOSIS — I5032 Chronic diastolic (congestive) heart failure: Secondary | ICD-10-CM | POA: Diagnosis not present

## 2019-04-20 DIAGNOSIS — I129 Hypertensive chronic kidney disease with stage 1 through stage 4 chronic kidney disease, or unspecified chronic kidney disease: Secondary | ICD-10-CM | POA: Diagnosis not present

## 2019-04-20 DIAGNOSIS — R519 Headache, unspecified: Secondary | ICD-10-CM | POA: Diagnosis not present

## 2019-04-20 DIAGNOSIS — Z23 Encounter for immunization: Secondary | ICD-10-CM | POA: Diagnosis not present

## 2019-04-20 DIAGNOSIS — E1142 Type 2 diabetes mellitus with diabetic polyneuropathy: Secondary | ICD-10-CM | POA: Diagnosis not present

## 2019-04-21 ENCOUNTER — Other Ambulatory Visit (HOSPITAL_COMMUNITY): Payer: Self-pay | Admitting: Cardiology

## 2019-04-21 DIAGNOSIS — L570 Actinic keratosis: Secondary | ICD-10-CM | POA: Diagnosis not present

## 2019-04-21 DIAGNOSIS — L219 Seborrheic dermatitis, unspecified: Secondary | ICD-10-CM | POA: Diagnosis not present

## 2019-04-21 DIAGNOSIS — Z85828 Personal history of other malignant neoplasm of skin: Secondary | ICD-10-CM | POA: Diagnosis not present

## 2019-04-21 DIAGNOSIS — Z23 Encounter for immunization: Secondary | ICD-10-CM | POA: Diagnosis not present

## 2019-04-21 DIAGNOSIS — L821 Other seborrheic keratosis: Secondary | ICD-10-CM | POA: Diagnosis not present

## 2019-04-21 DIAGNOSIS — L708 Other acne: Secondary | ICD-10-CM | POA: Diagnosis not present

## 2019-04-21 DIAGNOSIS — Z808 Family history of malignant neoplasm of other organs or systems: Secondary | ICD-10-CM | POA: Diagnosis not present

## 2019-05-07 DIAGNOSIS — M79672 Pain in left foot: Secondary | ICD-10-CM | POA: Diagnosis not present

## 2019-05-07 DIAGNOSIS — B351 Tinea unguium: Secondary | ICD-10-CM | POA: Diagnosis not present

## 2019-05-07 DIAGNOSIS — E119 Type 2 diabetes mellitus without complications: Secondary | ICD-10-CM | POA: Diagnosis not present

## 2019-05-07 DIAGNOSIS — L84 Corns and callosities: Secondary | ICD-10-CM | POA: Diagnosis not present

## 2019-05-07 DIAGNOSIS — M79671 Pain in right foot: Secondary | ICD-10-CM | POA: Diagnosis not present

## 2019-05-09 ENCOUNTER — Other Ambulatory Visit (HOSPITAL_COMMUNITY): Payer: Self-pay | Admitting: Internal Medicine

## 2019-05-09 DIAGNOSIS — I5022 Chronic systolic (congestive) heart failure: Secondary | ICD-10-CM

## 2019-05-10 ENCOUNTER — Other Ambulatory Visit: Payer: Self-pay | Admitting: *Deleted

## 2019-05-16 ENCOUNTER — Other Ambulatory Visit (HOSPITAL_COMMUNITY): Payer: Self-pay | Admitting: Internal Medicine

## 2019-05-17 ENCOUNTER — Other Ambulatory Visit: Payer: Self-pay

## 2019-05-17 ENCOUNTER — Inpatient Hospital Stay: Payer: PPO | Attending: Adult Health | Admitting: Adult Health

## 2019-05-17 ENCOUNTER — Encounter: Payer: Self-pay | Admitting: Adult Health

## 2019-05-17 DIAGNOSIS — Z853 Personal history of malignant neoplasm of breast: Secondary | ICD-10-CM | POA: Insufficient documentation

## 2019-05-17 DIAGNOSIS — N189 Chronic kidney disease, unspecified: Secondary | ICD-10-CM | POA: Diagnosis not present

## 2019-05-17 DIAGNOSIS — E1122 Type 2 diabetes mellitus with diabetic chronic kidney disease: Secondary | ICD-10-CM | POA: Insufficient documentation

## 2019-05-17 DIAGNOSIS — Z79899 Other long term (current) drug therapy: Secondary | ICD-10-CM | POA: Insufficient documentation

## 2019-05-17 DIAGNOSIS — I130 Hypertensive heart and chronic kidney disease with heart failure and stage 1 through stage 4 chronic kidney disease, or unspecified chronic kidney disease: Secondary | ICD-10-CM | POA: Insufficient documentation

## 2019-05-17 NOTE — Progress Notes (Signed)
CLINIC:  Survivorship   REASON FOR VISIT:  Routine follow-up for history of breast cancer.   BRIEF ONCOLOGIC HISTORY: Per Dr. Jana Hakim most recent note   85 y.o. [place] woman, status post right breast upper-outer quadrant biopsy 03/15/2013 for a clinical T1c N0, stage IA invasive ductal carcinoma, grade 1 or 2, estrogen receptor 100% positive, progesterone receptor 100% positive, with an MIB-1 of 30%, and HER-2 amplified by [facility], with a ratio of 3.51 and an average HER-2 copy number Rebecca Hall of 6.85  (1) biopsy of 2 suspicious areas in the left breast both showed no evidence of malignancy  (2) right lumpectomy and sentinel lymph node sampling 04/28/2013 showed a pT1b pN0, stage IA invasive ductal carcinoma, grade 2, with negative margins             (a) did not need radiation therapy  (3) trastuzumab started 05/31/2013, given every 3 weeks for total of 9 doses; held as of 07/16/2013 after 3 doses because of possible allergic dermatitis associated with the drug.   (4) anastrozole started 05/08/2013, and 5 years in November 2019             (a) dexa scan 06/10/2013 showed osteopenia with a T score of -2.1             (b) zolendronate started 09/13/2013, repeated 04/17/2015             (c) switched to denosumab/Prolia as of April 2017  (5) congestive heart failure in the setting of sepsis August 2016, with normal repeat echo December 2016    INTERVAL HISTORY:  Rebecca Hall presents to the [facility] today for routine follow-up for her history of breast cancer.  Overall, she reports feeling quite well.   She is walking for exercise, but was challenged with doing this outside due to allergies.  She sees her PCP regularly.  She also recently saw Dr. Delman Cheadle in dermatology.  She sees her regularly.    Rebecca Hall is overdue for mammogram and is having this done in January of this year.    REVIEW OF SYSTEMS:  Review of Systems  Constitutional: Negative for appetite change,  chills, fatigue, fever and unexpected weight change.  HENT:   Negative for hearing loss, lump/mass and trouble swallowing.   Eyes: Negative for eye problems and icterus.  Respiratory: Negative for chest tightness, cough and shortness of breath.   Cardiovascular: Negative for chest pain and palpitations.  Gastrointestinal: Negative for abdominal distention, abdominal pain, constipation, diarrhea, nausea and vomiting.  Endocrine: Negative for hot flashes.  Genitourinary: Negative for difficulty urinating.   Musculoskeletal: Negative for arthralgias.  Skin: Negative for itching and rash.  Neurological: Negative for dizziness, extremity weakness, headaches and numbness.  Hematological: Negative for adenopathy. Does not bruise/bleed easily.  Psychiatric/Behavioral: Negative for depression. The patient is not nervous/anxious.   Breast: Denies any new nodularity, masses, tenderness, nipple changes, or nipple discharge.    PAST MEDICAL/SURGICAL HISTORY:  Past Medical History:  Diagnosis Date  . Arthritis   . Breast cancer ([place]) 03/15/13   right, 12 o'clock  . Chronic kidney disease   . Diabetes mellitus without complication ([place])   . GERD (gastroesophageal reflux disease)   . History of lower GI bleeding   . Hyperkalemia 12/21/2015  . Hyperlipidemia   . Hypertension   . IBS (irritable bowel syndrome)   . Neuromuscular disorder ([place])    peripheral neuropathy feet  . Osteoporosis   . Personal history of chemotherapy  Past Surgical History:  Procedure Laterality Date  . ABDOMINAL HYSTERECTOMY  1975   No salpingo-oophorectomy  . BREAST BIOPSY Right 2014   malignant  . BREAST BIOPSY Left 04/12/2013   benign x2  . BREAST LUMPECTOMY Right 2014  . BREAST LUMPECTOMY WITH NEEDLE LOCALIZATION AND AXILLARY SENTINEL LYMPH NODE BX Right 04/28/2013   Procedure: BREAST LUMPECTOMY WITH NEEDLE LOCALIZATION AND AXILLARY SENTINEL LYMPH NODE BX;  Surgeon: Rolm Bookbinder, MD;  Location: [facility];  Service: General;  Laterality: Right;  . CARDIAC CATHETERIZATION N/A 01/26/2015   Procedure: Right/Left Heart Cath and Coronary Angiography;  Surgeon: Jolaine Artist, MD;  Location: [facility];  Service: Cardiovascular;  Laterality: N/A;  . CATARACT EXTRACTION  2009   both  . COLONOSCOPY    . SKIN CANCER EXCISION     head, face, hand ..multiple years  . TONSILLECTOMY       ALLERGIES:  Allergies  Allergen Reactions  . Levaquin [Levofloxacin In D5w] Anaphylaxis and Nausea Only    Shaking real bad on medicaton  . Codeine Nausea And Vomiting    Pass out  . Herceptin [Trastuzumab] Rash    Patient has developed extensive body skin rash following each Herceptin treatment of increased presence following each treatment  that cleared with oral steroids  . Sulfa Antibiotics Swelling  . Tape Rash    Blisters      CURRENT MEDICATIONS:  Outpatient Encounter Medications as of 05/17/2019  Medication Sig  . acetaminophen (TYLENOL) 650 MG CR tablet Take 650 mg by mouth every 8 (eight) hours as needed for pain.  Marland Kitchen allopurinol (ZYLOPRIM) 300 MG tablet Take 300 mg by mouth daily.  Marland Kitchen amLODipine (NORVASC) 2.5 MG tablet TAKE ONE TABLET BY MOUTH ONE TIME DAILY   . aspirin 81 MG tablet Take 81 mg by mouth at bedtime.   Marland Kitchen atorvastatin (LIPITOR) 40 MG tablet Take 1 tablet (40 mg total) by mouth every morning.  . calcium carbonate (OS-CAL) 600 MG TABS tablet Take 600 mg by mouth 2 (two) times daily with a meal.   . carvedilol (COREG) 3.125 MG tablet TAKE ONE TABLET BY MOUTH TWICE DAILY   . denosumab (PROLIA) 60 MG/ML SOLN injection Inject 60 mg into the skin every 6 (six) months. Administer in upper arm, thigh, or abdomen  . Dulaglutide (TRULICITY) 4.17 EY/8.1KG SOPN Inject 0.75 mg into the skin once a week.  . fenofibrate (TRICOR) 48 MG tablet Take 48 mg by mouth daily.  . ferrous sulfate 325 (65 FE) MG EC tablet Take 325 mg by mouth daily with breakfast.  . furosemide (LASIX)  20 MG tablet TAKE TWO TABLETS BY MOUTH IN THE MORNING AND ONE ADDITIONAL TABLET IN THE EVENING AS NEEDED FOR SWELLING  . gabapentin (NEURONTIN) 300 MG capsule TAKE ONE CAPSULE BY MOUTH AT BEDTIME   . glimepiride (AMARYL) 2 MG tablet Take 2 mg by mouth daily with breakfast.  . loratadine (CLARITIN) 10 MG tablet Take 10 mg by mouth every morning.   . Melatonin 5 MG TABS Take 5 mg by mouth at bedtime as needed (As needed for sleep).  . Multiple Vitamin (MULTIVITAMIN WITH MINERALS) TABS tablet Take 1 tablet by mouth 2 (two) times daily.   Marland Kitchen omeprazole (PRILOSEC) 20 MG capsule Take 20 mg by mouth daily.   . potassium chloride (K-DUR) 10 MEQ tablet TAKE TWO TABLETS BY MOUTH TWICE DAILY   . traZODone (DESYREL) 50 MG tablet Take 50 mg by mouth at bedtime as  needed.   . Vitamin D, Ergocalciferol, (DRISDOL) 50000 UNITS CAPS capsule Take 50,000 Units by mouth every 14 (fourteen) days.   . vitamin E 100 UNIT capsule Take 100 Units by mouth daily.  . [DISCONTINUED] Icosapent Ethyl (VASCEPA) 1 g CAPS Take by mouth.  . [DISCONTINUED] minoxidil (ROGAINE) 2 % external solution Apply 1 application topically 3 (three) times a week.   . [DISCONTINUED] traMADol (ULTRAM) 50 MG tablet Take 50 mg by mouth every 6 (six) hours as needed for moderate pain.    No facility-administered encounter medications on file as of 05/17/2019.      ONCOLOGIC FAMILY HISTORY:  Family History  Problem Relation Age of Onset  . Cancer Maternal Aunt 80       colon cancer  . Cancer Maternal Uncle 65       lung  . Cancer Maternal Grandmother        pancreas  . Cerebral aneurysm Father     GENETIC COUNSELING/TESTING: Not at this time  SOCIAL HISTORY:  Social History   Socioeconomic History  . Marital status: Married    Spouse name: Not on file  . Number of children: Not on file  . Years of education: Not on file  . Highest education level: Not on file  Occupational History  . Not on file  Social Needs  . Financial  resource strain: Not on file  . Food insecurity    Worry: Not on file    Inability: Not on file  . Transportation needs    Medical: Not on file    Non-medical: Not on file  Tobacco Use  . Smoking status: Never Smoker  . Smokeless tobacco: Never Used  Substance and Sexual Activity  . Alcohol use: No  . Drug use: No  . Sexual activity: Not on file    Comment: first live birth age 33, P 2, Estrogen   Lifestyle  . Physical activity    Days per week: Not on file    Minutes per session: Not on file  . Stress: Not on file  Relationships  . Social Herbalist on phone: Not on file    Gets together: Not on file    Attends religious service: Not on file    Active member of club or organization: Not on file    Attends meetings of clubs or organizations: Not on file    Relationship status: Not on file  . Intimate partner violence    Fear of current or ex partner: Not on file    Emotionally abused: Not on file    Physically abused: Not on file    Forced sexual activity: Not on file  Other Topics Concern  . Not on file  Social History Narrative  . Not on file      PHYSICAL EXAMINATION:  Vital Signs: Vitals:   05/17/19 1415  BP: (!) 146/60  Pulse: (!) 58  Resp: 17  Temp: 98.2 F (36.8 C)  SpO2: 93%   Filed Weights   05/17/19 1415  Weight: 159 lb 6.4 oz (72.3 kg)   General: Well-nourished, well-appearing female in no acute distress.  Unaccompanied today.   HEENT: Head is normocephalic.  Pupils equal and reactive to light. Conjunctivae clear without exudate.  Sclerae anicteric. Oral mucosa is pink, moist.  Oropharynx is pink without lesions or erythema.  Lymph: No cervical, supraclavicular, or infraclavicular lymphadenopathy noted on palpation.  Cardiovascular: Regular rate and rhythm.Marland Kitchen Respiratory: Clear to auscultation bilaterally. Chest expansion  symmetric; breathing non-labored.  Breast Exam:  -Left breast: No appreciable masses on palpation. No skin redness,  thickening, or peau d'orange appearance; no nipple retraction or nipple discharge;.  -Right breast: No appreciable masses on palpation. No skin redness, thickening, or peau d'orange appearance; no nipple retraction or nipple discharge; mild distortion in symmetry at previous lumpectomy site well healed scar without erythema or nodularity. -Axilla: No axillary adenopathy bilaterally.  GI: Abdomen soft and round; non-tender, non-distended. Bowel sounds normoactive. No hepatosplenomegaly.   GU: Deferred.  Neuro: No focal deficits. Steady gait.  Psych: Mood and affect normal and appropriate for situation.  MSK: No focal spinal tenderness to palpation, full range of motion in bilateral upper extremities Extremities: No edema. Skin: Warm and dry.  LABORATORY DATA:  None for this visit   DIAGNOSTIC IMAGING:  Most recent mammogram: scheduled in 06/2019    ASSESSMENT AND PLAN:  Ms.. Hall is a pleasant 85 y.o. female with history of Stage IA right breast invasive ductal carcinoma, ER+/PR+/HER2+, diagnosed in 02/2013, treated with lumpectomy, maintenance Trastuzumab, and anti-estrogen therapy with Anastrozole x 5 years completing therapy in 04/2018.  She [facility] for surveillance and routine follow-up.   1. History of breast cancer:  Rebecca Hall is currently clinically and radiographically without evidence of disease or recurrence of breast cancer. She is overdue for mammogram and has this scheudled in 06/2019.I encouraged her to call me with any questions or concerns before her next visit at the cancer center, and I would be happy to see her sooner, if needed.    2. Bone health:  Given Rebecca Hall age, history of breast cancer, and her previous anti-estrogen therapy with Anastrozole, she is at risk for bone demineralization.  She was given education on specific food and activities to promote bone health.  3. Cancer screening:  Due to Rebecca Hall's history and her age, she should  receive screening for skin cancers. She was encouraged to follow-up with her PCP for appropriate cancer screenings.   4. Health maintenance and wellness promotion: Rebecca Hall was encouraged to consume 5-7 servings of fruits and vegetables per day. She was also encouraged to engage in moderate to vigorous exercise for 30 minutes per day most days of the week. She was instructed to limit her alcohol consumption and continue to abstain from tobacco use.      Dispo:  -Return to cancer center in one year for LTS follow up -Mammogram in 06/2019   A total of (20) minutes of face-to-face time was spent with this patient with greater than 50% of that time in counseling and care-coordination.   Gardenia Phlegm, NP Survivorship Program [facility] [phone]   Note: PRIMARY CARE PROVIDER [name], [place] [phone]

## 2019-05-18 ENCOUNTER — Telehealth: Payer: Self-pay | Admitting: Adult Health

## 2019-05-18 DIAGNOSIS — I5032 Chronic diastolic (congestive) heart failure: Secondary | ICD-10-CM | POA: Diagnosis not present

## 2019-05-18 DIAGNOSIS — E782 Mixed hyperlipidemia: Secondary | ICD-10-CM | POA: Diagnosis not present

## 2019-05-18 DIAGNOSIS — E1122 Type 2 diabetes mellitus with diabetic chronic kidney disease: Secondary | ICD-10-CM | POA: Diagnosis not present

## 2019-05-18 DIAGNOSIS — N1831 Chronic kidney disease, stage 3a: Secondary | ICD-10-CM | POA: Diagnosis not present

## 2019-05-18 DIAGNOSIS — M810 Age-related osteoporosis without current pathological fracture: Secondary | ICD-10-CM | POA: Diagnosis not present

## 2019-05-18 DIAGNOSIS — E1142 Type 2 diabetes mellitus with diabetic polyneuropathy: Secondary | ICD-10-CM | POA: Diagnosis not present

## 2019-05-18 DIAGNOSIS — J440 Chronic obstructive pulmonary disease with acute lower respiratory infection: Secondary | ICD-10-CM | POA: Diagnosis not present

## 2019-05-18 DIAGNOSIS — I129 Hypertensive chronic kidney disease with stage 1 through stage 4 chronic kidney disease, or unspecified chronic kidney disease: Secondary | ICD-10-CM | POA: Diagnosis not present

## 2019-05-18 DIAGNOSIS — D509 Iron deficiency anemia, unspecified: Secondary | ICD-10-CM | POA: Diagnosis not present

## 2019-05-18 NOTE — Telephone Encounter (Signed)
I talk with patient regarding schedule  

## 2019-05-19 ENCOUNTER — Ambulatory Visit: Payer: PPO

## 2019-05-20 DIAGNOSIS — Z853 Personal history of malignant neoplasm of breast: Secondary | ICD-10-CM | POA: Insufficient documentation

## 2019-07-06 ENCOUNTER — Ambulatory Visit
Admission: RE | Admit: 2019-07-06 | Discharge: 2019-07-06 | Disposition: A | Discharge status: Home / Self Care | Payer: PPO | Source: Ambulatory Visit | Attending: Oncology | Admitting: Oncology

## 2019-07-06 ENCOUNTER — Other Ambulatory Visit: Payer: Self-pay

## 2019-07-06 DIAGNOSIS — Z1231 Encounter for screening mammogram for malignant neoplasm of breast: Secondary | ICD-10-CM

## 2019-07-16 DIAGNOSIS — B351 Tinea unguium: Secondary | ICD-10-CM | POA: Diagnosis not present

## 2019-07-16 DIAGNOSIS — M79672 Pain in left foot: Secondary | ICD-10-CM | POA: Diagnosis not present

## 2019-07-16 DIAGNOSIS — E119 Type 2 diabetes mellitus without complications: Secondary | ICD-10-CM | POA: Diagnosis not present

## 2019-07-16 DIAGNOSIS — L84 Corns and callosities: Secondary | ICD-10-CM | POA: Diagnosis not present

## 2019-07-16 DIAGNOSIS — M79671 Pain in right foot: Secondary | ICD-10-CM | POA: Diagnosis not present

## 2019-07-25 ENCOUNTER — Other Ambulatory Visit (HOSPITAL_COMMUNITY): Payer: Self-pay | Admitting: Internal Medicine

## 2019-07-27 DIAGNOSIS — I5032 Chronic diastolic (congestive) heart failure: Secondary | ICD-10-CM | POA: Diagnosis not present

## 2019-07-27 DIAGNOSIS — Z7984 Long term (current) use of oral hypoglycemic drugs: Secondary | ICD-10-CM | POA: Diagnosis not present

## 2019-07-27 DIAGNOSIS — E1142 Type 2 diabetes mellitus with diabetic polyneuropathy: Secondary | ICD-10-CM | POA: Diagnosis not present

## 2019-07-27 DIAGNOSIS — N183 Chronic kidney disease, stage 3 unspecified: Secondary | ICD-10-CM | POA: Diagnosis not present

## 2019-07-27 DIAGNOSIS — I129 Hypertensive chronic kidney disease with stage 1 through stage 4 chronic kidney disease, or unspecified chronic kidney disease: Secondary | ICD-10-CM | POA: Diagnosis not present

## 2019-07-27 DIAGNOSIS — J440 Chronic obstructive pulmonary disease with acute lower respiratory infection: Secondary | ICD-10-CM | POA: Diagnosis not present

## 2019-07-27 DIAGNOSIS — E1122 Type 2 diabetes mellitus with diabetic chronic kidney disease: Secondary | ICD-10-CM | POA: Diagnosis not present

## 2019-07-27 DIAGNOSIS — E782 Mixed hyperlipidemia: Secondary | ICD-10-CM | POA: Diagnosis not present

## 2019-07-27 DIAGNOSIS — M810 Age-related osteoporosis without current pathological fracture: Secondary | ICD-10-CM | POA: Diagnosis not present

## 2019-07-27 DIAGNOSIS — D509 Iron deficiency anemia, unspecified: Secondary | ICD-10-CM | POA: Diagnosis not present

## 2019-08-08 ENCOUNTER — Other Ambulatory Visit (HOSPITAL_COMMUNITY): Payer: Self-pay | Admitting: Internal Medicine

## 2019-08-08 DIAGNOSIS — I5022 Chronic systolic (congestive) heart failure: Secondary | ICD-10-CM

## 2019-09-07 DIAGNOSIS — E1122 Type 2 diabetes mellitus with diabetic chronic kidney disease: Secondary | ICD-10-CM | POA: Diagnosis not present

## 2019-09-07 DIAGNOSIS — E1142 Type 2 diabetes mellitus with diabetic polyneuropathy: Secondary | ICD-10-CM | POA: Diagnosis not present

## 2019-09-07 DIAGNOSIS — I129 Hypertensive chronic kidney disease with stage 1 through stage 4 chronic kidney disease, or unspecified chronic kidney disease: Secondary | ICD-10-CM | POA: Diagnosis not present

## 2019-09-07 DIAGNOSIS — M542 Cervicalgia: Secondary | ICD-10-CM | POA: Diagnosis not present

## 2019-09-07 DIAGNOSIS — J440 Chronic obstructive pulmonary disease with acute lower respiratory infection: Secondary | ICD-10-CM | POA: Diagnosis not present

## 2019-09-07 DIAGNOSIS — I5032 Chronic diastolic (congestive) heart failure: Secondary | ICD-10-CM | POA: Diagnosis not present

## 2019-09-07 DIAGNOSIS — Z79899 Other long term (current) drug therapy: Secondary | ICD-10-CM | POA: Diagnosis not present

## 2019-09-07 DIAGNOSIS — N1832 Chronic kidney disease, stage 3b: Secondary | ICD-10-CM | POA: Diagnosis not present

## 2019-09-16 DIAGNOSIS — M810 Age-related osteoporosis without current pathological fracture: Secondary | ICD-10-CM | POA: Diagnosis not present

## 2019-09-20 DIAGNOSIS — E782 Mixed hyperlipidemia: Secondary | ICD-10-CM | POA: Diagnosis not present

## 2019-09-20 DIAGNOSIS — M810 Age-related osteoporosis without current pathological fracture: Secondary | ICD-10-CM | POA: Diagnosis not present

## 2019-09-20 DIAGNOSIS — I5032 Chronic diastolic (congestive) heart failure: Secondary | ICD-10-CM | POA: Diagnosis not present

## 2019-09-20 DIAGNOSIS — E1142 Type 2 diabetes mellitus with diabetic polyneuropathy: Secondary | ICD-10-CM | POA: Diagnosis not present

## 2019-09-20 DIAGNOSIS — J440 Chronic obstructive pulmonary disease with acute lower respiratory infection: Secondary | ICD-10-CM | POA: Diagnosis not present

## 2019-09-20 DIAGNOSIS — I129 Hypertensive chronic kidney disease with stage 1 through stage 4 chronic kidney disease, or unspecified chronic kidney disease: Secondary | ICD-10-CM | POA: Diagnosis not present

## 2019-09-20 DIAGNOSIS — Z7984 Long term (current) use of oral hypoglycemic drugs: Secondary | ICD-10-CM | POA: Diagnosis not present

## 2019-09-20 DIAGNOSIS — E1122 Type 2 diabetes mellitus with diabetic chronic kidney disease: Secondary | ICD-10-CM | POA: Diagnosis not present

## 2019-09-20 DIAGNOSIS — N1832 Chronic kidney disease, stage 3b: Secondary | ICD-10-CM | POA: Diagnosis not present

## 2019-09-20 DIAGNOSIS — D509 Iron deficiency anemia, unspecified: Secondary | ICD-10-CM | POA: Diagnosis not present

## 2019-09-20 DIAGNOSIS — G4700 Insomnia, unspecified: Secondary | ICD-10-CM | POA: Diagnosis not present

## 2019-09-30 ENCOUNTER — Other Ambulatory Visit (HOSPITAL_COMMUNITY): Payer: Self-pay | Admitting: Cardiology

## 2019-10-01 DIAGNOSIS — M79672 Pain in left foot: Secondary | ICD-10-CM | POA: Diagnosis not present

## 2019-10-01 DIAGNOSIS — E119 Type 2 diabetes mellitus without complications: Secondary | ICD-10-CM | POA: Diagnosis not present

## 2019-10-01 DIAGNOSIS — L84 Corns and callosities: Secondary | ICD-10-CM | POA: Diagnosis not present

## 2019-10-01 DIAGNOSIS — B351 Tinea unguium: Secondary | ICD-10-CM | POA: Diagnosis not present

## 2019-10-01 DIAGNOSIS — M79671 Pain in right foot: Secondary | ICD-10-CM | POA: Diagnosis not present

## 2019-10-05 DIAGNOSIS — I5032 Chronic diastolic (congestive) heart failure: Secondary | ICD-10-CM | POA: Diagnosis not present

## 2019-10-05 DIAGNOSIS — N1832 Chronic kidney disease, stage 3b: Secondary | ICD-10-CM | POA: Diagnosis not present

## 2019-10-05 DIAGNOSIS — D509 Iron deficiency anemia, unspecified: Secondary | ICD-10-CM | POA: Diagnosis not present

## 2019-10-05 DIAGNOSIS — J440 Chronic obstructive pulmonary disease with acute lower respiratory infection: Secondary | ICD-10-CM | POA: Diagnosis not present

## 2019-10-05 DIAGNOSIS — E1142 Type 2 diabetes mellitus with diabetic polyneuropathy: Secondary | ICD-10-CM | POA: Diagnosis not present

## 2019-10-05 DIAGNOSIS — E782 Mixed hyperlipidemia: Secondary | ICD-10-CM | POA: Diagnosis not present

## 2019-10-05 DIAGNOSIS — E1122 Type 2 diabetes mellitus with diabetic chronic kidney disease: Secondary | ICD-10-CM | POA: Diagnosis not present

## 2019-10-05 DIAGNOSIS — I129 Hypertensive chronic kidney disease with stage 1 through stage 4 chronic kidney disease, or unspecified chronic kidney disease: Secondary | ICD-10-CM | POA: Diagnosis not present

## 2019-10-05 DIAGNOSIS — M810 Age-related osteoporosis without current pathological fracture: Secondary | ICD-10-CM | POA: Diagnosis not present

## 2019-10-05 DIAGNOSIS — G4700 Insomnia, unspecified: Secondary | ICD-10-CM | POA: Diagnosis not present

## 2019-10-11 DIAGNOSIS — I129 Hypertensive chronic kidney disease with stage 1 through stage 4 chronic kidney disease, or unspecified chronic kidney disease: Secondary | ICD-10-CM | POA: Diagnosis not present

## 2019-10-11 DIAGNOSIS — H539 Unspecified visual disturbance: Secondary | ICD-10-CM | POA: Diagnosis not present

## 2019-10-11 DIAGNOSIS — N1832 Chronic kidney disease, stage 3b: Secondary | ICD-10-CM | POA: Diagnosis not present

## 2019-10-11 DIAGNOSIS — E1122 Type 2 diabetes mellitus with diabetic chronic kidney disease: Secondary | ICD-10-CM | POA: Diagnosis not present

## 2019-10-11 DIAGNOSIS — J0100 Acute maxillary sinusitis, unspecified: Secondary | ICD-10-CM | POA: Diagnosis not present

## 2019-10-11 DIAGNOSIS — Z7984 Long term (current) use of oral hypoglycemic drugs: Secondary | ICD-10-CM | POA: Diagnosis not present

## 2019-10-13 DIAGNOSIS — H02401 Unspecified ptosis of right eyelid: Secondary | ICD-10-CM | POA: Diagnosis not present

## 2019-10-13 DIAGNOSIS — L03213 Periorbital cellulitis: Secondary | ICD-10-CM | POA: Diagnosis not present

## 2019-10-25 DIAGNOSIS — E1122 Type 2 diabetes mellitus with diabetic chronic kidney disease: Secondary | ICD-10-CM | POA: Diagnosis not present

## 2019-10-25 DIAGNOSIS — I5032 Chronic diastolic (congestive) heart failure: Secondary | ICD-10-CM | POA: Diagnosis not present

## 2019-10-25 DIAGNOSIS — G4700 Insomnia, unspecified: Secondary | ICD-10-CM | POA: Diagnosis not present

## 2019-10-25 DIAGNOSIS — I129 Hypertensive chronic kidney disease with stage 1 through stage 4 chronic kidney disease, or unspecified chronic kidney disease: Secondary | ICD-10-CM | POA: Diagnosis not present

## 2019-10-25 DIAGNOSIS — M810 Age-related osteoporosis without current pathological fracture: Secondary | ICD-10-CM | POA: Diagnosis not present

## 2019-10-25 DIAGNOSIS — N1832 Chronic kidney disease, stage 3b: Secondary | ICD-10-CM | POA: Diagnosis not present

## 2019-10-25 DIAGNOSIS — E782 Mixed hyperlipidemia: Secondary | ICD-10-CM | POA: Diagnosis not present

## 2019-10-25 DIAGNOSIS — D509 Iron deficiency anemia, unspecified: Secondary | ICD-10-CM | POA: Diagnosis not present

## 2019-10-25 DIAGNOSIS — E1142 Type 2 diabetes mellitus with diabetic polyneuropathy: Secondary | ICD-10-CM | POA: Diagnosis not present

## 2019-10-25 DIAGNOSIS — J440 Chronic obstructive pulmonary disease with acute lower respiratory infection: Secondary | ICD-10-CM | POA: Diagnosis not present

## 2019-10-28 DIAGNOSIS — L03213 Periorbital cellulitis: Secondary | ICD-10-CM | POA: Diagnosis not present

## 2019-11-07 ENCOUNTER — Other Ambulatory Visit (HOSPITAL_COMMUNITY): Payer: Self-pay | Admitting: Internal Medicine

## 2019-11-07 DIAGNOSIS — I5022 Chronic systolic (congestive) heart failure: Secondary | ICD-10-CM

## 2019-11-29 ENCOUNTER — Encounter (HOSPITAL_COMMUNITY): Payer: Self-pay | Admitting: Internal Medicine

## 2019-11-29 ENCOUNTER — Ambulatory Visit (HOSPITAL_COMMUNITY)
Admission: RE | Admit: 2019-11-29 | Discharge: 2019-11-29 | Disposition: A | Discharge status: Home / Self Care | Payer: PPO | Source: Ambulatory Visit | Attending: Internal Medicine | Admitting: Internal Medicine

## 2019-11-29 ENCOUNTER — Other Ambulatory Visit: Payer: Self-pay

## 2019-11-29 VITALS — BP 114/64 | HR 65 | Wt 154.8 lb

## 2019-11-29 DIAGNOSIS — Z7984 Long term (current) use of oral hypoglycemic drugs: Secondary | ICD-10-CM | POA: Insufficient documentation

## 2019-11-29 DIAGNOSIS — K589 Irritable bowel syndrome without diarrhea: Secondary | ICD-10-CM | POA: Diagnosis not present

## 2019-11-29 DIAGNOSIS — N183 Chronic kidney disease, stage 3 unspecified: Secondary | ICD-10-CM | POA: Diagnosis not present

## 2019-11-29 DIAGNOSIS — Z79899 Other long term (current) drug therapy: Secondary | ICD-10-CM | POA: Diagnosis not present

## 2019-11-29 DIAGNOSIS — I5022 Chronic systolic (congestive) heart failure: Secondary | ICD-10-CM

## 2019-11-29 DIAGNOSIS — I130 Hypertensive heart and chronic kidney disease with heart failure and stage 1 through stage 4 chronic kidney disease, or unspecified chronic kidney disease: Secondary | ICD-10-CM | POA: Diagnosis not present

## 2019-11-29 DIAGNOSIS — I428 Other cardiomyopathies: Secondary | ICD-10-CM | POA: Diagnosis not present

## 2019-11-29 DIAGNOSIS — Z853 Personal history of malignant neoplasm of breast: Secondary | ICD-10-CM | POA: Insufficient documentation

## 2019-11-29 DIAGNOSIS — Z7982 Long term (current) use of aspirin: Secondary | ICD-10-CM | POA: Diagnosis not present

## 2019-11-29 DIAGNOSIS — K219 Gastro-esophageal reflux disease without esophagitis: Secondary | ICD-10-CM | POA: Diagnosis not present

## 2019-11-29 DIAGNOSIS — E785 Hyperlipidemia, unspecified: Secondary | ICD-10-CM | POA: Insufficient documentation

## 2019-11-29 DIAGNOSIS — I10 Essential (primary) hypertension: Secondary | ICD-10-CM | POA: Diagnosis not present

## 2019-11-29 DIAGNOSIS — I5032 Chronic diastolic (congestive) heart failure: Secondary | ICD-10-CM | POA: Diagnosis not present

## 2019-11-29 DIAGNOSIS — E1122 Type 2 diabetes mellitus with diabetic chronic kidney disease: Secondary | ICD-10-CM | POA: Diagnosis not present

## 2019-11-29 DIAGNOSIS — I2510 Atherosclerotic heart disease of native coronary artery without angina pectoris: Secondary | ICD-10-CM | POA: Insufficient documentation

## 2019-11-29 DIAGNOSIS — M1990 Unspecified osteoarthritis, unspecified site: Secondary | ICD-10-CM | POA: Insufficient documentation

## 2019-11-29 DIAGNOSIS — E1142 Type 2 diabetes mellitus with diabetic polyneuropathy: Secondary | ICD-10-CM | POA: Insufficient documentation

## 2019-11-29 LAB — BASIC METABOLIC PANEL
Anion gap: 12 (ref 5–15)
BUN: 17 mg/dL (ref 8–23)
CO2: 24 mmol/L (ref 22–32)
Calcium: 9.2 mg/dL (ref 8.9–10.3)
Chloride: 101 mmol/L (ref 98–111)
Creatinine, Ser: 1.31 mg/dL — ABNORMAL HIGH (ref 0.44–1.00)
GFR calc Af Amer: 43 mL/min — ABNORMAL LOW (ref >60)
GFR calc non Af Amer: 37 mL/min — ABNORMAL LOW (ref >60)
Glucose, Bld: 157 mg/dL — ABNORMAL HIGH (ref 70–99)
Potassium: 3.7 mmol/L (ref 3.5–5.1)
Sodium: 137 mmol/L (ref 135–145)

## 2019-11-29 NOTE — Addendum Note (Signed)
Encounter addended by: Scarlette Calico, RN on: 11/29/2019 2:47 PM  Actions taken: Visit diagnoses modified, Order list changed, Diagnosis association updated, Clinical Note Signed, Charge Capture section accepted

## 2019-11-29 NOTE — Progress Notes (Signed)
Advanced Heart Failure Clinic Note   Patient ID: Rebecca Hall, female   DOB: [birth year], 86 y.o.   MRN: [id number] PCP: Dr Rebecca Hall.  Primary HF: Dr Rebecca Hall Oncologist: Dr Rebecca Hall  HPI: Rebecca Hall is a 86 y.o. female with history of Breast Cancer treated with 3 doses of herceptin in 2014, CKD, HTN, HL, GERD, Hx of GI Bleed, and DM.  Admitted to [facility] 8/16 with hypovolemic shock, AKI creatinine 4.7. After fluid resuscitation developed pulmonary edema. Found to have new onset LV dysfunction with EF 30-35% in 8/16.. Had cath as noted below with 1v CAD with 60% proximal LAD stenosis and NICM. Discharged on lasix, carvedilol, and Entresto. Discharge weight was 156 pounds. Echo 12/16 EF 55-60%,   Admitted 06/30/15 with weakness and dizziness and found to have recurrent AKI, hyperkalemia, and hypotension. (Creatinine 3.69 and K 7.4 on admit) Recovered with IVF.   Echo 7/18: EF 55-60%  Her husband passed away in 06-09-18 from [place] and pulmonary fibrosis. Now living at [facility].  Here for routine f/u. Says she is feeling pretty good. Able to get around more. COVID restrictions easing up. Able to eat in the cafeteria. Able to walk the halls without CP. Mild DOE when she walks too far. Minimal edema.   Cardiac studies:  [facility] 01/27/2015  RA = 11 RV = 42/8/15 PA = 38/8 (24) PCW = 19 Fick cardiac output/index = 5.2/3.0 Thermo CO/CI = 5.7/3.2 PVR = 1.0 WU FA sat = 93% PA sat = 51%, 51% Ao Pressure: 133/60 (85) LV Pressure: 133/9/17 No aortic stenosis  Assessment: 1) Moderate 1v CAD with 60% proximal LAD lesion 2) Non-ischemic CM with EF 30-35% by echo 3) Mildly elevated R-sided pressures with normal cardiac output   Review of systems complete and found to be negative unless listed in HPI.    SH:  Social History   Socioeconomic History  . Marital status: Married    Spouse name: Not on file  . Number of children: Not on file  . Years of education: Not on file  . Highest  education level: Not on file  Occupational History  . Not on file  Tobacco Use  . Smoking status: Never Smoker  . Smokeless tobacco: Never Used  Substance and Sexual Activity  . Alcohol use: No  . Drug use: No  . Sexual activity: Not on file    Comment: first live birth age 13, P 2, Estrogen   Other Topics Concern  . Not on file  Social History Narrative  . Not on file   Social Determinants of Health   Financial Resource Strain:   . Difficulty of Paying Living Expenses:   Food Insecurity:   . Worried About Charity fundraiser in the Last Year:   . Arboriculturist in the Last Year:   Transportation Needs:   . Film/video editor (Medical):   Marland Kitchen Lack of Transportation (Non-Medical):   Physical Activity:   . Days of Exercise per Week:   . Minutes of Exercise per Session:   Stress:   . Feeling of Stress :   Social Connections:   . Frequency of Communication with Friends and Family:   . Frequency of Social Gatherings with Friends and Family:   . Attends Religious Services:   . Active Member of Clubs or Organizations:   . Attends Archivist Meetings:   Marland Kitchen Marital Status:   Intimate Partner Violence:   . Fear of Current or  Ex-Partner:   . Emotionally Abused:   Marland Kitchen Physically Abused:   . Sexually Abused:     FH:  Family History  Problem Relation Age of Onset  . Cancer Maternal Aunt 80       colon cancer  . Cancer Maternal Uncle 65       lung  . Cancer Maternal Grandmother        pancreas  . Cerebral aneurysm Father     Past Medical History:  Diagnosis Date  . Arthritis   . Breast cancer ([place]) 03/15/13   right, 12 o'clock  . Chronic kidney disease   . Diabetes mellitus without complication ([place])   . GERD (gastroesophageal reflux disease)   . History of lower GI bleeding   . Hyperkalemia 12/21/2015  . Hyperlipidemia   . Hypertension   . IBS (irritable bowel syndrome)   . Neuromuscular disorder ([place])    peripheral neuropathy feet  . Osteoporosis     . Personal history of chemotherapy     Current Outpatient Medications  Medication Sig Dispense Refill  . acetaminophen (TYLENOL) 650 MG CR tablet Take 650 mg by mouth every 8 (eight) hours as needed for pain.    Marland Kitchen allopurinol (ZYLOPRIM) 300 MG tablet Take 300 mg by mouth daily.    Marland Kitchen amLODipine (NORVASC) 2.5 MG tablet TAKE ONE TABLET BY MOUTH ONE TIME DAILY  90 tablet 3  . aspirin 81 MG tablet Take 81 mg by mouth at bedtime.     Marland Kitchen atorvastatin (LIPITOR) 40 MG tablet Take 1 tablet (40 mg total) by mouth every morning. 30 tablet 11  . BIOTIN PO Take 500 mcg by mouth daily.    . calcium carbonate (OS-CAL) 600 MG TABS tablet Take 600 mg by mouth 2 (two) times daily with a meal.     . carvedilol (COREG) 3.125 MG tablet TAKE ONE TABLET BY MOUTH TWICE DAILY  180 tablet 3  . denosumab (PROLIA) 60 MG/ML SOLN injection Inject 60 mg into the skin every 6 (six) months. Administer in upper arm, thigh, or abdomen    . Dulaglutide (TRULICITY) 3.47 QQ/5.9DG SOPN Inject 0.75 mg into the skin once a week.    . fenofibrate (TRICOR) 145 MG tablet Take 145 mg by mouth daily.    . ferrous sulfate 325 (65 FE) MG EC tablet Take 325 mg by mouth daily with breakfast.    . furosemide (LASIX) 20 MG tablet TAKE TWO TABLETS BY MOUTH IN THE MORNING AND ONE ADDITIONAL TABLET IN THE EVENING AS NEEDED FOR SWELLING 270 tablet 0  . gabapentin (NEURONTIN) 300 MG capsule TAKE ONE CAPSULE BY MOUTH AT BEDTIME  90 capsule 0  . glimepiride (AMARYL) 2 MG tablet Take 2 mg by mouth daily with breakfast.    . loratadine (CLARITIN) 10 MG tablet Take 10 mg by mouth every morning.     . Melatonin 5 MG TABS Take 5 mg by mouth at bedtime as needed (As needed for sleep).    . metFORMIN (GLUCOPHAGE) 500 MG tablet Take 500 mg by mouth daily.    . Multiple Vitamin (MULTIVITAMIN WITH MINERALS) TABS tablet Take 1 tablet by mouth 2 (two) times daily.     Marland Kitchen omeprazole (PRILOSEC) 20 MG capsule Take 20 mg by mouth daily.     . potassium chloride  (K-DUR) 10 MEQ tablet TAKE TWO TABLETS BY MOUTH TWICE DAILY  120 tablet 0  . traZODone (DESYREL) 50 MG tablet Take 50 mg by mouth at bedtime as needed.     Marland Kitchen  Vitamin D, Ergocalciferol, (DRISDOL) 50000 UNITS CAPS capsule Take 50,000 Units by mouth every 14 (fourteen) days.     . vitamin E 100 UNIT capsule Take 100 Units by mouth daily.     No current facility-administered medications for this encounter.    Vitals:   11/29/19 1407  BP: 114/64  Pulse: 65  SpO2: 93%  Weight: 70.2 kg (154 lb 12.8 oz)    Wt Readings from Last 3 Encounters:  11/29/19 70.2 kg (154 lb 12.8 oz)  05/17/19 72.3 kg (159 lb 6.4 oz)  03/02/19 71.8 kg (158 lb 3.2 oz)    PHYSICAL EXAM:  General: Elderly . No resp difficulty HEENT: normal Neck: supple. no JVD. Carotids 2+ bilat; no bruits. No lymphadenopathy or thryomegaly appreciated. Cor: PMI nondisplaced. Regular rate & rhythm. No rubs, gallops or murmurs. Lungs: clear Abdomen: soft, nontender, nondistended. No hepatosplenomegaly. No bruits or masses. Good bowel sounds. Extremities: no cyanosis, clubbing, rash, edema Neuro: alert & orientedx3, cranial nerves grossly intact. moves all 4 extremities w/o difficulty. Affect pleasant    ASSESSMENT & PLAN: 1. Chronic diastolic CHF:  - She has history of nonischemic cardiomyopathy (EF 30-35% in 8/16). EF now recovered.  - Echo 12/2016 EF 55-60% Normal LV and RV, mild LVH.  - Stable NYHA II - Volume status looks good overall. Occasional peripheral edema likely due to venous insufficiency. Encouraged her to wear compression stockings.  - Continue lasix 20 mg daily - Continue carvedilol 3.125 mg BID - Off lisinopril.  Would not re-challenge given improvement in LV systolic function.  - Reinforced fluid restriction to < 2 L daily, sodium restriction to less than 2000 mg daily, and the importance of daily weights.   - BMET today - F/u 1 year with echo 2. CKD: Stage 3 - Baseline creatinine 1.5-1.8. Followed by Dr.  Felipa Hall - Check labs today 3. Hx of Breast Cancer: Only received 3 doses of Herceptin in 2014. Stopped due to possible allergy. 4. DM2 - stable - GFR < 30 so no SGLT2i 5. HTN:  -Blood pressure well controlled. Continue current regimen. -Goal SBP 125-140.  6. HLD:  - Continue lipitor 40 mg daily 7. Heavy snoring:  - Has refused sleep study.  8. CAD:  - Nonobstructive on prior cath. 8/19 LAD 60% mid - No s/s ischemia - Continue atorva 40  - ContinueASA 81 mg daily.   Rebecca Bickers, MD  2:26 PM

## 2019-11-29 NOTE — Patient Instructions (Signed)
Labs done today, your results will be available in MyChart, we will contact you for abnormal readings.  Please call our office June 2022 to schedule your follow up appointment and echocardiogram  If you have any questions or concerns before your next appointment please send [place] a message through [place] or call our office at [phone].    TO LEAVE A MESSAGE FOR THE NURSE SELECT OPTION 2, PLEASE LEAVE A MESSAGE INCLUDING: . YOUR NAME . DATE OF BIRTH . CALL BACK NUMBER . REASON FOR CALL**this is important as we prioritize the call backs  [place] AS LONG AS YOU CALL BEFORE 4:00 PM  At the [facility], you and your health needs are our priority. As part of our continuing mission to provide you with exceptional heart care, we have created designated Provider Care Teams. These Care Teams include your primary Cardiologist (physician) and Advanced Practice Providers (APPs- Physician Assistants and Nurse Practitioners) who all work together to provide you with the care you need, when you need it.   You may see any of the following providers on your designated Care Team at your next follow up: Marland Kitchen Dr Glori Bickers . Dr Loralie Champagne . Darrick Grinder, NP . Lyda Jester, PA . Audry Riles, PharmD   Please be sure to bring in all your medications bottles to every appointment.

## 2019-12-03 DIAGNOSIS — M79671 Pain in right foot: Secondary | ICD-10-CM | POA: Diagnosis not present

## 2019-12-03 DIAGNOSIS — M79672 Pain in left foot: Secondary | ICD-10-CM | POA: Diagnosis not present

## 2019-12-03 DIAGNOSIS — L84 Corns and callosities: Secondary | ICD-10-CM | POA: Diagnosis not present

## 2019-12-03 DIAGNOSIS — E119 Type 2 diabetes mellitus without complications: Secondary | ICD-10-CM | POA: Diagnosis not present

## 2019-12-03 DIAGNOSIS — B351 Tinea unguium: Secondary | ICD-10-CM | POA: Diagnosis not present

## 2019-12-19 DIAGNOSIS — I5032 Chronic diastolic (congestive) heart failure: Secondary | ICD-10-CM | POA: Diagnosis not present

## 2019-12-19 DIAGNOSIS — D509 Iron deficiency anemia, unspecified: Secondary | ICD-10-CM | POA: Diagnosis not present

## 2019-12-19 DIAGNOSIS — I129 Hypertensive chronic kidney disease with stage 1 through stage 4 chronic kidney disease, or unspecified chronic kidney disease: Secondary | ICD-10-CM | POA: Diagnosis not present

## 2019-12-19 DIAGNOSIS — J440 Chronic obstructive pulmonary disease with acute lower respiratory infection: Secondary | ICD-10-CM | POA: Diagnosis not present

## 2019-12-19 DIAGNOSIS — N1832 Chronic kidney disease, stage 3b: Secondary | ICD-10-CM | POA: Diagnosis not present

## 2019-12-19 DIAGNOSIS — E782 Mixed hyperlipidemia: Secondary | ICD-10-CM | POA: Diagnosis not present

## 2019-12-19 DIAGNOSIS — M810 Age-related osteoporosis without current pathological fracture: Secondary | ICD-10-CM | POA: Diagnosis not present

## 2019-12-19 DIAGNOSIS — G4700 Insomnia, unspecified: Secondary | ICD-10-CM | POA: Diagnosis not present

## 2019-12-19 DIAGNOSIS — E1142 Type 2 diabetes mellitus with diabetic polyneuropathy: Secondary | ICD-10-CM | POA: Diagnosis not present

## 2019-12-19 DIAGNOSIS — E1122 Type 2 diabetes mellitus with diabetic chronic kidney disease: Secondary | ICD-10-CM | POA: Diagnosis not present

## 2020-01-13 DIAGNOSIS — E119 Type 2 diabetes mellitus without complications: Secondary | ICD-10-CM | POA: Diagnosis not present

## 2020-01-13 DIAGNOSIS — Z961 Presence of intraocular lens: Secondary | ICD-10-CM | POA: Diagnosis not present

## 2020-01-13 DIAGNOSIS — H43813 Vitreous degeneration, bilateral: Secondary | ICD-10-CM | POA: Diagnosis not present

## 2020-01-13 DIAGNOSIS — H52203 Unspecified astigmatism, bilateral: Secondary | ICD-10-CM | POA: Diagnosis not present

## 2020-01-17 ENCOUNTER — Other Ambulatory Visit (HOSPITAL_COMMUNITY): Payer: Self-pay | Admitting: Internal Medicine

## 2020-01-17 DIAGNOSIS — I129 Hypertensive chronic kidney disease with stage 1 through stage 4 chronic kidney disease, or unspecified chronic kidney disease: Secondary | ICD-10-CM | POA: Diagnosis not present

## 2020-01-17 DIAGNOSIS — I5032 Chronic diastolic (congestive) heart failure: Secondary | ICD-10-CM | POA: Diagnosis not present

## 2020-01-17 DIAGNOSIS — N1832 Chronic kidney disease, stage 3b: Secondary | ICD-10-CM | POA: Diagnosis not present

## 2020-01-17 DIAGNOSIS — E1122 Type 2 diabetes mellitus with diabetic chronic kidney disease: Secondary | ICD-10-CM | POA: Diagnosis not present

## 2020-01-17 DIAGNOSIS — J440 Chronic obstructive pulmonary disease with acute lower respiratory infection: Secondary | ICD-10-CM | POA: Diagnosis not present

## 2020-01-17 DIAGNOSIS — E782 Mixed hyperlipidemia: Secondary | ICD-10-CM | POA: Diagnosis not present

## 2020-01-17 DIAGNOSIS — E1142 Type 2 diabetes mellitus with diabetic polyneuropathy: Secondary | ICD-10-CM | POA: Diagnosis not present

## 2020-01-17 DIAGNOSIS — G4700 Insomnia, unspecified: Secondary | ICD-10-CM | POA: Diagnosis not present

## 2020-01-17 DIAGNOSIS — M810 Age-related osteoporosis without current pathological fracture: Secondary | ICD-10-CM | POA: Diagnosis not present

## 2020-01-17 DIAGNOSIS — D509 Iron deficiency anemia, unspecified: Secondary | ICD-10-CM | POA: Diagnosis not present

## 2020-01-18 DIAGNOSIS — I5032 Chronic diastolic (congestive) heart failure: Secondary | ICD-10-CM | POA: Diagnosis not present

## 2020-01-18 DIAGNOSIS — E1142 Type 2 diabetes mellitus with diabetic polyneuropathy: Secondary | ICD-10-CM | POA: Diagnosis not present

## 2020-01-18 DIAGNOSIS — K909 Intestinal malabsorption, unspecified: Secondary | ICD-10-CM | POA: Diagnosis not present

## 2020-01-18 DIAGNOSIS — J440 Chronic obstructive pulmonary disease with acute lower respiratory infection: Secondary | ICD-10-CM | POA: Diagnosis not present

## 2020-01-18 DIAGNOSIS — D509 Iron deficiency anemia, unspecified: Secondary | ICD-10-CM | POA: Diagnosis not present

## 2020-01-18 DIAGNOSIS — Z1389 Encounter for screening for other disorder: Secondary | ICD-10-CM | POA: Diagnosis not present

## 2020-01-18 DIAGNOSIS — N1832 Chronic kidney disease, stage 3b: Secondary | ICD-10-CM | POA: Diagnosis not present

## 2020-01-18 DIAGNOSIS — E1122 Type 2 diabetes mellitus with diabetic chronic kidney disease: Secondary | ICD-10-CM | POA: Diagnosis not present

## 2020-01-18 DIAGNOSIS — I700 Atherosclerosis of aorta: Secondary | ICD-10-CM | POA: Diagnosis not present

## 2020-01-18 DIAGNOSIS — M810 Age-related osteoporosis without current pathological fracture: Secondary | ICD-10-CM | POA: Diagnosis not present

## 2020-01-18 DIAGNOSIS — Z0000 Encounter for general adult medical examination without abnormal findings: Secondary | ICD-10-CM | POA: Diagnosis not present

## 2020-01-18 DIAGNOSIS — Z79899 Other long term (current) drug therapy: Secondary | ICD-10-CM | POA: Diagnosis not present

## 2020-01-18 DIAGNOSIS — E782 Mixed hyperlipidemia: Secondary | ICD-10-CM | POA: Diagnosis not present

## 2020-01-19 ENCOUNTER — Other Ambulatory Visit (HOSPITAL_COMMUNITY): Payer: Self-pay | Admitting: Internal Medicine

## 2020-02-14 ENCOUNTER — Other Ambulatory Visit: Payer: Self-pay | Admitting: Geriatric Medicine

## 2020-02-14 DIAGNOSIS — R42 Dizziness and giddiness: Secondary | ICD-10-CM | POA: Diagnosis not present

## 2020-02-14 DIAGNOSIS — I129 Hypertensive chronic kidney disease with stage 1 through stage 4 chronic kidney disease, or unspecified chronic kidney disease: Secondary | ICD-10-CM | POA: Diagnosis not present

## 2020-02-14 DIAGNOSIS — R269 Unspecified abnormalities of gait and mobility: Secondary | ICD-10-CM | POA: Diagnosis not present

## 2020-02-14 DIAGNOSIS — N1832 Chronic kidney disease, stage 3b: Secondary | ICD-10-CM | POA: Diagnosis not present

## 2020-02-14 DIAGNOSIS — R519 Headache, unspecified: Secondary | ICD-10-CM | POA: Diagnosis not present

## 2020-02-15 ENCOUNTER — Other Ambulatory Visit: Payer: Self-pay | Admitting: Geriatric Medicine

## 2020-02-15 DIAGNOSIS — R42 Dizziness and giddiness: Secondary | ICD-10-CM

## 2020-02-16 DIAGNOSIS — I129 Hypertensive chronic kidney disease with stage 1 through stage 4 chronic kidney disease, or unspecified chronic kidney disease: Secondary | ICD-10-CM | POA: Diagnosis not present

## 2020-02-16 DIAGNOSIS — M810 Age-related osteoporosis without current pathological fracture: Secondary | ICD-10-CM | POA: Diagnosis not present

## 2020-02-16 DIAGNOSIS — D509 Iron deficiency anemia, unspecified: Secondary | ICD-10-CM | POA: Diagnosis not present

## 2020-02-16 DIAGNOSIS — J440 Chronic obstructive pulmonary disease with acute lower respiratory infection: Secondary | ICD-10-CM | POA: Diagnosis not present

## 2020-02-16 DIAGNOSIS — G4700 Insomnia, unspecified: Secondary | ICD-10-CM | POA: Diagnosis not present

## 2020-02-16 DIAGNOSIS — I5032 Chronic diastolic (congestive) heart failure: Secondary | ICD-10-CM | POA: Diagnosis not present

## 2020-02-16 DIAGNOSIS — E1122 Type 2 diabetes mellitus with diabetic chronic kidney disease: Secondary | ICD-10-CM | POA: Diagnosis not present

## 2020-02-16 DIAGNOSIS — E1142 Type 2 diabetes mellitus with diabetic polyneuropathy: Secondary | ICD-10-CM | POA: Diagnosis not present

## 2020-02-16 DIAGNOSIS — N1832 Chronic kidney disease, stage 3b: Secondary | ICD-10-CM | POA: Diagnosis not present

## 2020-02-16 DIAGNOSIS — E782 Mixed hyperlipidemia: Secondary | ICD-10-CM | POA: Diagnosis not present

## 2020-02-23 DIAGNOSIS — E1122 Type 2 diabetes mellitus with diabetic chronic kidney disease: Secondary | ICD-10-CM | POA: Diagnosis not present

## 2020-02-23 DIAGNOSIS — E1142 Type 2 diabetes mellitus with diabetic polyneuropathy: Secondary | ICD-10-CM | POA: Diagnosis not present

## 2020-02-23 DIAGNOSIS — Z79899 Other long term (current) drug therapy: Secondary | ICD-10-CM | POA: Diagnosis not present

## 2020-02-23 DIAGNOSIS — I5032 Chronic diastolic (congestive) heart failure: Secondary | ICD-10-CM | POA: Diagnosis not present

## 2020-02-23 DIAGNOSIS — Z7984 Long term (current) use of oral hypoglycemic drugs: Secondary | ICD-10-CM | POA: Diagnosis not present

## 2020-02-23 DIAGNOSIS — I129 Hypertensive chronic kidney disease with stage 1 through stage 4 chronic kidney disease, or unspecified chronic kidney disease: Secondary | ICD-10-CM | POA: Diagnosis not present

## 2020-02-23 DIAGNOSIS — N1832 Chronic kidney disease, stage 3b: Secondary | ICD-10-CM | POA: Diagnosis not present

## 2020-03-09 ENCOUNTER — Ambulatory Visit
Admission: RE | Admit: 2020-03-09 | Discharge: 2020-03-09 | Disposition: A | Discharge status: Home / Self Care | Payer: PPO | Source: Ambulatory Visit | Attending: Geriatric Medicine | Admitting: Geriatric Medicine

## 2020-03-09 ENCOUNTER — Other Ambulatory Visit: Payer: PPO

## 2020-03-09 ENCOUNTER — Other Ambulatory Visit: Payer: Self-pay

## 2020-03-09 DIAGNOSIS — J321 Chronic frontal sinusitis: Secondary | ICD-10-CM | POA: Diagnosis not present

## 2020-03-09 DIAGNOSIS — R42 Dizziness and giddiness: Secondary | ICD-10-CM | POA: Diagnosis not present

## 2020-03-09 DIAGNOSIS — G319 Degenerative disease of nervous system, unspecified: Secondary | ICD-10-CM | POA: Diagnosis not present

## 2020-03-09 DIAGNOSIS — J3489 Other specified disorders of nose and nasal sinuses: Secondary | ICD-10-CM | POA: Diagnosis not present

## 2020-03-09 MED ORDER — GADOBENATE DIMEGLUMINE 529 MG/ML IV SOLN
13.0000 mL | Freq: Once | INTRAVENOUS | Status: AC | PRN
  Administered 2020-03-09: 13 mL via INTRAVENOUS

## 2020-03-17 DIAGNOSIS — M79672 Pain in left foot: Secondary | ICD-10-CM | POA: Diagnosis not present

## 2020-03-17 DIAGNOSIS — M79671 Pain in right foot: Secondary | ICD-10-CM | POA: Diagnosis not present

## 2020-03-17 DIAGNOSIS — E119 Type 2 diabetes mellitus without complications: Secondary | ICD-10-CM | POA: Diagnosis not present

## 2020-03-17 DIAGNOSIS — B351 Tinea unguium: Secondary | ICD-10-CM | POA: Diagnosis not present

## 2020-03-17 DIAGNOSIS — L84 Corns and callosities: Secondary | ICD-10-CM | POA: Diagnosis not present

## 2020-03-29 DIAGNOSIS — M810 Age-related osteoporosis without current pathological fracture: Secondary | ICD-10-CM | POA: Diagnosis not present

## 2020-03-29 DIAGNOSIS — G4700 Insomnia, unspecified: Secondary | ICD-10-CM | POA: Diagnosis not present

## 2020-03-29 DIAGNOSIS — N1832 Chronic kidney disease, stage 3b: Secondary | ICD-10-CM | POA: Diagnosis not present

## 2020-03-29 DIAGNOSIS — D509 Iron deficiency anemia, unspecified: Secondary | ICD-10-CM | POA: Diagnosis not present

## 2020-03-29 DIAGNOSIS — J440 Chronic obstructive pulmonary disease with acute lower respiratory infection: Secondary | ICD-10-CM | POA: Diagnosis not present

## 2020-03-29 DIAGNOSIS — E1122 Type 2 diabetes mellitus with diabetic chronic kidney disease: Secondary | ICD-10-CM | POA: Diagnosis not present

## 2020-03-29 DIAGNOSIS — E782 Mixed hyperlipidemia: Secondary | ICD-10-CM | POA: Diagnosis not present

## 2020-03-29 DIAGNOSIS — I5032 Chronic diastolic (congestive) heart failure: Secondary | ICD-10-CM | POA: Diagnosis not present

## 2020-03-29 DIAGNOSIS — I129 Hypertensive chronic kidney disease with stage 1 through stage 4 chronic kidney disease, or unspecified chronic kidney disease: Secondary | ICD-10-CM | POA: Diagnosis not present

## 2020-03-29 DIAGNOSIS — E1142 Type 2 diabetes mellitus with diabetic polyneuropathy: Secondary | ICD-10-CM | POA: Diagnosis not present

## 2020-03-30 DIAGNOSIS — M810 Age-related osteoporosis without current pathological fracture: Secondary | ICD-10-CM | POA: Diagnosis not present

## 2020-04-09 ENCOUNTER — Other Ambulatory Visit (HOSPITAL_COMMUNITY): Payer: Self-pay | Admitting: Internal Medicine

## 2020-04-26 DIAGNOSIS — I129 Hypertensive chronic kidney disease with stage 1 through stage 4 chronic kidney disease, or unspecified chronic kidney disease: Secondary | ICD-10-CM | POA: Diagnosis not present

## 2020-04-26 DIAGNOSIS — Z7984 Long term (current) use of oral hypoglycemic drugs: Secondary | ICD-10-CM | POA: Diagnosis not present

## 2020-04-26 DIAGNOSIS — E1142 Type 2 diabetes mellitus with diabetic polyneuropathy: Secondary | ICD-10-CM | POA: Diagnosis not present

## 2020-04-26 DIAGNOSIS — S0001XA Abrasion of scalp, initial encounter: Secondary | ICD-10-CM | POA: Diagnosis not present

## 2020-04-26 DIAGNOSIS — N1832 Chronic kidney disease, stage 3b: Secondary | ICD-10-CM | POA: Diagnosis not present

## 2020-04-26 DIAGNOSIS — E1122 Type 2 diabetes mellitus with diabetic chronic kidney disease: Secondary | ICD-10-CM | POA: Diagnosis not present

## 2020-05-04 DIAGNOSIS — E1142 Type 2 diabetes mellitus with diabetic polyneuropathy: Secondary | ICD-10-CM | POA: Diagnosis not present

## 2020-05-04 DIAGNOSIS — M810 Age-related osteoporosis without current pathological fracture: Secondary | ICD-10-CM | POA: Diagnosis not present

## 2020-05-04 DIAGNOSIS — I5032 Chronic diastolic (congestive) heart failure: Secondary | ICD-10-CM | POA: Diagnosis not present

## 2020-05-04 DIAGNOSIS — G4700 Insomnia, unspecified: Secondary | ICD-10-CM | POA: Diagnosis not present

## 2020-05-04 DIAGNOSIS — D509 Iron deficiency anemia, unspecified: Secondary | ICD-10-CM | POA: Diagnosis not present

## 2020-05-04 DIAGNOSIS — K219 Gastro-esophageal reflux disease without esophagitis: Secondary | ICD-10-CM | POA: Diagnosis not present

## 2020-05-04 DIAGNOSIS — E782 Mixed hyperlipidemia: Secondary | ICD-10-CM | POA: Diagnosis not present

## 2020-05-04 DIAGNOSIS — J440 Chronic obstructive pulmonary disease with acute lower respiratory infection: Secondary | ICD-10-CM | POA: Diagnosis not present

## 2020-05-04 DIAGNOSIS — I129 Hypertensive chronic kidney disease with stage 1 through stage 4 chronic kidney disease, or unspecified chronic kidney disease: Secondary | ICD-10-CM | POA: Diagnosis not present

## 2020-05-04 DIAGNOSIS — N1832 Chronic kidney disease, stage 3b: Secondary | ICD-10-CM | POA: Diagnosis not present

## 2020-05-04 DIAGNOSIS — E1122 Type 2 diabetes mellitus with diabetic chronic kidney disease: Secondary | ICD-10-CM | POA: Diagnosis not present

## 2020-05-10 DIAGNOSIS — D485 Neoplasm of uncertain behavior of skin: Secondary | ICD-10-CM | POA: Diagnosis not present

## 2020-05-10 DIAGNOSIS — D225 Melanocytic nevi of trunk: Secondary | ICD-10-CM | POA: Diagnosis not present

## 2020-05-10 DIAGNOSIS — Z85828 Personal history of other malignant neoplasm of skin: Secondary | ICD-10-CM | POA: Diagnosis not present

## 2020-05-10 DIAGNOSIS — L658 Other specified nonscarring hair loss: Secondary | ICD-10-CM | POA: Diagnosis not present

## 2020-05-10 DIAGNOSIS — L570 Actinic keratosis: Secondary | ICD-10-CM | POA: Diagnosis not present

## 2020-05-10 DIAGNOSIS — L578 Other skin changes due to chronic exposure to nonionizing radiation: Secondary | ICD-10-CM | POA: Diagnosis not present

## 2020-05-10 DIAGNOSIS — Z808 Family history of malignant neoplasm of other organs or systems: Secondary | ICD-10-CM | POA: Diagnosis not present

## 2020-05-10 DIAGNOSIS — L821 Other seborrheic keratosis: Secondary | ICD-10-CM | POA: Diagnosis not present

## 2020-05-25 ENCOUNTER — Telehealth: Payer: Self-pay | Admitting: Adult Health

## 2020-05-25 NOTE — Telephone Encounter (Signed)
Rescheduled appts per provider request/template change. Left voicemail with cancellation details and new appt date and time.

## 2020-05-26 DIAGNOSIS — M79671 Pain in right foot: Secondary | ICD-10-CM | POA: Diagnosis not present

## 2020-05-26 DIAGNOSIS — E119 Type 2 diabetes mellitus without complications: Secondary | ICD-10-CM | POA: Diagnosis not present

## 2020-05-26 DIAGNOSIS — M79672 Pain in left foot: Secondary | ICD-10-CM | POA: Diagnosis not present

## 2020-05-26 DIAGNOSIS — B351 Tinea unguium: Secondary | ICD-10-CM | POA: Diagnosis not present

## 2020-05-26 DIAGNOSIS — L84 Corns and callosities: Secondary | ICD-10-CM | POA: Diagnosis not present

## 2020-06-01 ENCOUNTER — Encounter: Payer: PPO | Admitting: Adult Health

## 2020-06-01 DIAGNOSIS — N1832 Chronic kidney disease, stage 3b: Secondary | ICD-10-CM | POA: Diagnosis not present

## 2020-06-01 DIAGNOSIS — G4700 Insomnia, unspecified: Secondary | ICD-10-CM | POA: Diagnosis not present

## 2020-06-01 DIAGNOSIS — I129 Hypertensive chronic kidney disease with stage 1 through stage 4 chronic kidney disease, or unspecified chronic kidney disease: Secondary | ICD-10-CM | POA: Diagnosis not present

## 2020-06-01 DIAGNOSIS — D509 Iron deficiency anemia, unspecified: Secondary | ICD-10-CM | POA: Diagnosis not present

## 2020-06-01 DIAGNOSIS — M810 Age-related osteoporosis without current pathological fracture: Secondary | ICD-10-CM | POA: Diagnosis not present

## 2020-06-01 DIAGNOSIS — J440 Chronic obstructive pulmonary disease with acute lower respiratory infection: Secondary | ICD-10-CM | POA: Diagnosis not present

## 2020-06-01 DIAGNOSIS — E782 Mixed hyperlipidemia: Secondary | ICD-10-CM | POA: Diagnosis not present

## 2020-06-01 DIAGNOSIS — E1142 Type 2 diabetes mellitus with diabetic polyneuropathy: Secondary | ICD-10-CM | POA: Diagnosis not present

## 2020-06-01 DIAGNOSIS — I5032 Chronic diastolic (congestive) heart failure: Secondary | ICD-10-CM | POA: Diagnosis not present

## 2020-06-01 DIAGNOSIS — E1122 Type 2 diabetes mellitus with diabetic chronic kidney disease: Secondary | ICD-10-CM | POA: Diagnosis not present

## 2020-06-01 DIAGNOSIS — K219 Gastro-esophageal reflux disease without esophagitis: Secondary | ICD-10-CM | POA: Diagnosis not present

## 2020-06-08 ENCOUNTER — Encounter: Payer: PPO | Admitting: Adult Health

## 2020-06-19 ENCOUNTER — Telehealth: Payer: Self-pay | Admitting: Adult Health

## 2020-06-19 NOTE — Telephone Encounter (Signed)
Rescheduled appointment due to patient out of town per 12/27 schedule message. Patient is aware of changes.

## 2020-06-20 ENCOUNTER — Inpatient Hospital Stay: Payer: PPO | Admitting: Adult Health

## 2020-06-22 ENCOUNTER — Encounter: Payer: Self-pay | Admitting: Adult Health

## 2020-06-22 ENCOUNTER — Inpatient Hospital Stay: Payer: PPO | Attending: Adult Health | Admitting: Adult Health

## 2020-06-22 ENCOUNTER — Other Ambulatory Visit: Payer: Self-pay

## 2020-06-22 VITALS — BP 169/93 | HR 66 | Temp 97.4°F | Resp 17 | Ht 61.0 in | Wt 149.5 lb

## 2020-06-22 DIAGNOSIS — Z853 Personal history of malignant neoplasm of breast: Secondary | ICD-10-CM | POA: Insufficient documentation

## 2020-06-22 DIAGNOSIS — Z170 Estrogen receptor positive status [ER+]: Secondary | ICD-10-CM

## 2020-06-22 DIAGNOSIS — I130 Hypertensive heart and chronic kidney disease with heart failure and stage 1 through stage 4 chronic kidney disease, or unspecified chronic kidney disease: Secondary | ICD-10-CM | POA: Diagnosis not present

## 2020-06-22 DIAGNOSIS — C50411 Malignant neoplasm of upper-outer quadrant of right female breast: Secondary | ICD-10-CM

## 2020-06-22 DIAGNOSIS — Z9221 Personal history of antineoplastic chemotherapy: Secondary | ICD-10-CM | POA: Diagnosis not present

## 2020-06-22 DIAGNOSIS — Z79899 Other long term (current) drug therapy: Secondary | ICD-10-CM | POA: Insufficient documentation

## 2020-06-22 DIAGNOSIS — E1122 Type 2 diabetes mellitus with diabetic chronic kidney disease: Secondary | ICD-10-CM | POA: Insufficient documentation

## 2020-06-22 DIAGNOSIS — N189 Chronic kidney disease, unspecified: Secondary | ICD-10-CM | POA: Diagnosis not present

## 2020-06-22 NOTE — Progress Notes (Signed)
CLINIC:  Survivorship   REASON FOR VISIT:  Routine follow-up for history of breast cancer.   BRIEF ONCOLOGIC HISTORY: Per Dr. Jana Hakim most recent note   86 y.o. [place] woman, status post right breast upper-outer quadrant biopsy 03/15/2013 for a clinical T1c N0, stage IA invasive ductal carcinoma, grade 1 or 2, estrogen receptor 100% positive, progesterone receptor 100% positive, with an MIB-1 of 30%, and HER-2 amplified by [facility], with a ratio of 3.51 and an average HER-2 copy number Rebecca Hall of 6.85  (1) biopsy of 2 suspicious areas in the left breast both showed no evidence of malignancy  (2) right lumpectomy and sentinel lymph node sampling 04/28/2013 showed a pT1b pN0, stage IA invasive ductal carcinoma, grade 2, with negative margins             (a) did not need radiation therapy  (3) trastuzumab started 05/31/2013, given every 3 weeks for total of 9 doses; held as of 07/16/2013 after 3 doses because of possible allergic dermatitis associated with the drug.   (4) anastrozole started 05/08/2013, and 5 years in November 2019             (a) dexa scan 06/10/2013 showed osteopenia with a T score of -2.1             (b) zolendronate started 09/13/2013, repeated 04/17/2015             (c) switched to denosumab/Prolia as of April 2017  (5) congestive heart failure in the setting of sepsis August 2016, with normal repeat echo December 2016    INTERVAL HISTORY:  Rebecca Hall presents to the [facility] today for routine follow-up for her history of breast cancer.  Overall, she reports feeling quite well.   Her most recent mammogram was 07/06/2019, showed no evidence of malignancy and breast density category B.  She remains active and is seeing her PCP regularly.  She is up to date with her cancer screenings.     REVIEW OF SYSTEMS:  Review of Systems  Constitutional: Negative for appetite change, chills, fatigue, fever and unexpected weight change.  HENT:   Negative for  hearing loss, lump/mass and trouble swallowing.   Eyes: Negative for eye problems and icterus.  Respiratory: Negative for chest tightness, cough and shortness of breath.   Cardiovascular: Negative for chest pain and palpitations.  Gastrointestinal: Negative for abdominal distention, abdominal pain, constipation, diarrhea, nausea and vomiting.  Endocrine: Negative for hot flashes.  Genitourinary: Negative for difficulty urinating.   Musculoskeletal: Negative for arthralgias.  Skin: Negative for itching and rash.  Neurological: Negative for dizziness, extremity weakness, headaches and numbness.  Hematological: Negative for adenopathy. Does not bruise/bleed easily.  Psychiatric/Behavioral: Negative for depression. The patient is not nervous/anxious.   Breast: Denies any new nodularity, masses, tenderness, nipple changes, or nipple discharge.    PAST MEDICAL/SURGICAL HISTORY:  Past Medical History:  Diagnosis Date  . Arthritis   . Breast cancer ([place]) 03/15/13   right, 12 o'clock  . Chronic kidney disease   . Diabetes mellitus without complication ([place])   . GERD (gastroesophageal reflux disease)   . History of lower GI bleeding   . Hyperkalemia 12/21/2015  . Hyperlipidemia   . Hypertension   . IBS (irritable bowel syndrome)   . Neuromuscular disorder ([place])    peripheral neuropathy feet  . Osteoporosis   . Personal history of chemotherapy    Past Surgical History:  Procedure Laterality Date  . ABDOMINAL HYSTERECTOMY  1975   No  salpingo-oophorectomy  . BREAST BIOPSY Right 2014   malignant  . BREAST BIOPSY Left 04/12/2013   benign x2  . BREAST LUMPECTOMY Right 2014  . BREAST LUMPECTOMY WITH NEEDLE LOCALIZATION AND AXILLARY SENTINEL LYMPH NODE BX Right 04/28/2013   Procedure: BREAST LUMPECTOMY WITH NEEDLE LOCALIZATION AND AXILLARY SENTINEL LYMPH NODE BX;  Surgeon: Rolm Bookbinder, MD;  Location: [facility];  Service: General;  Laterality: Right;  . CARDIAC  CATHETERIZATION N/A 01/26/2015   Procedure: Right/Left Heart Cath and Coronary Angiography;  Surgeon: Jolaine Artist, MD;  Location: [facility];  Service: Cardiovascular;  Laterality: N/A;  . CATARACT EXTRACTION  2009   both  . COLONOSCOPY    . SKIN CANCER EXCISION     head, face, hand ..multiple years  . TONSILLECTOMY       ALLERGIES:  Allergies  Allergen Reactions  . Levaquin [Levofloxacin In D5w] Anaphylaxis and Nausea Only    Shaking real bad on medicaton  . Codeine Nausea And Vomiting    Pass out  . Herceptin [Trastuzumab] Rash    Patient has developed extensive body skin rash following each Herceptin treatment of increased presence following each treatment  that cleared with oral steroids  . Sulfa Antibiotics Swelling  . Tape Rash    Blisters   . Aldactone [Spironolactone] Other (See Comments)    hyperkalemia     CURRENT MEDICATIONS:  Outpatient Encounter Medications as of 06/22/2020  Medication Sig  . acetaminophen (TYLENOL) 650 MG CR tablet Take 650 mg by mouth every 8 (eight) hours as needed for pain.  Marland Kitchen allopurinol (ZYLOPRIM) 300 MG tablet Take 300 mg by mouth daily.  Marland Kitchen amLODipine (NORVASC) 2.5 MG tablet TAKE ONE TABLET BY MOUTH ONE TIME DAILY   . aspirin 81 MG tablet Take 81 mg by mouth at bedtime.   Marland Kitchen atorvastatin (LIPITOR) 40 MG tablet TAKE ONE TABLET BY MOUTH IN THE MORNING  . BIOTIN PO Take 500 mcg by mouth daily.  . calcium carbonate (OS-CAL) 600 MG TABS tablet Take 600 mg by mouth 2 (two) times daily with a meal.   . carvedilol (COREG) 3.125 MG tablet TAKE ONE TABLET BY MOUTH TWICE DAILY   . denosumab (PROLIA) 60 MG/ML SOLN injection Inject 60 mg into the skin every 6 (six) months. Administer in upper arm, thigh, or abdomen  . Dulaglutide (TRULICITY) 2.62 MB/5.5HR SOPN Inject 0.75 mg into the skin once a week.  . fenofibrate (TRICOR) 145 MG tablet Take 145 mg by mouth daily.  . ferrous sulfate 325 (65 FE) MG EC tablet Take 325 mg by mouth daily  with breakfast.  . furosemide (LASIX) 20 MG tablet TAKE TWO TABLETS BY MOUTH IN THE MORNING AND ONE ADDITIONAL TABLET IN THE EVENING AS NEEDED FOR SWELLING  . furosemide (LASIX) 20 MG tablet TAKE TWO TABLET BY MOUTH  IN THE MORNING AND ONE ADDITIONAL TABLET BY MOUTH IN EVENING AS NEEDED FOR SWELLING  . gabapentin (NEURONTIN) 300 MG capsule TAKE ONE CAPSULE BY MOUTH AT BEDTIME   . glimepiride (AMARYL) 2 MG tablet Take 2 mg by mouth daily with breakfast.  . loratadine (CLARITIN) 10 MG tablet Take 10 mg by mouth every morning.   . Melatonin 5 MG TABS Take 5 mg by mouth at bedtime as needed (As needed for sleep).  . metFORMIN (GLUCOPHAGE) 500 MG tablet Take 500 mg by mouth daily.  . Multiple Vitamin (MULTIVITAMIN WITH MINERALS) TABS tablet Take 1 tablet by mouth 2 (two) times daily.   Marland Kitchen  omeprazole (PRILOSEC) 20 MG capsule Take 20 mg by mouth daily.   . potassium chloride (K-DUR) 10 MEQ tablet TAKE TWO TABLETS BY MOUTH TWICE DAILY   . traZODone (DESYREL) 50 MG tablet Take 50 mg by mouth at bedtime as needed.   . Vitamin D, Ergocalciferol, (DRISDOL) 50000 UNITS CAPS capsule Take 50,000 Units by mouth every 14 (fourteen) days.   . vitamin E 100 UNIT capsule Take 100 Units by mouth daily.   No facility-administered encounter medications on file as of 06/22/2020.     ONCOLOGIC FAMILY HISTORY:  Family History  Problem Relation Age of Onset  . Cancer Maternal Aunt 80       colon cancer  . Cancer Maternal Uncle 65       lung  . Cancer Maternal Grandmother        pancreas  . Cerebral aneurysm Father     GENETIC COUNSELING/TESTING: Not at this time  SOCIAL HISTORY:  Social History   Socioeconomic History  . Marital status: Married    Spouse name: Not on file  . Number of children: Not on file  . Years of education: Not on file  . Highest education level: Not on file  Occupational History  . Not on file  Tobacco Use  . Smoking status: Never Smoker  . Smokeless tobacco: Never Used   Substance and Sexual Activity  . Alcohol use: No  . Drug use: No  . Sexual activity: Not on file    Comment: first live birth age 74, P 2, Estrogen   Other Topics Concern  . Not on file  Social History Narrative  . Not on file   Social Determinants of Health   Financial Resource Strain: Not on file  Food Insecurity: Not on file  Transportation Needs: Not on file  Physical Activity: Not on file  Stress: Not on file  Social Connections: Not on file  Intimate Partner Violence: Not on file      PHYSICAL EXAMINATION:  Vital Signs: Vitals:   06/22/20 1322  BP: (!) 169/93  Pulse: 66  Resp: 17  Temp: (!) 97.4 F (36.3 C)  SpO2: 97%   Filed Weights   06/22/20 1322  Weight: 149 lb 8 oz (67.8 kg)   General: Well-nourished, well-appearing female in no acute distress.  Unaccompanied today.   HEENT: Head is normocephalic.  Pupils equal and reactive to light. Conjunctivae clear without exudate.  Sclerae anicteric. Oral mucosa is pink, moist.  Oropharynx is pink without lesions or erythema.  Lymph: No cervical, supraclavicular, or infraclavicular lymphadenopathy noted on palpation.  Cardiovascular: Regular rate and rhythm.Marland Kitchen Respiratory: Clear to auscultation bilaterally. Chest expansion symmetric; breathing non-labored.  Breast Exam:  -Left breast: No appreciable masses on palpation. No skin redness, thickening, or peau d'orange appearance; no nipple retraction or nipple discharge;.  -Right breast: No appreciable masses on palpation. No skin redness, thickening, or peau d'orange appearance; no nipple retraction or nipple discharge; mild distortion in symmetry at previous lumpectomy site well healed scar without erythema or nodularity. -Axilla: No axillary adenopathy bilaterally.  GI: Abdomen soft and round; non-tender, non-distended. Bowel sounds normoactive. No hepatosplenomegaly.   GU: Deferred.  Neuro: No focal deficits. Steady gait.  Psych: Mood and affect normal and  appropriate for situation.  MSK: No focal spinal tenderness to palpation, full range of motion in bilateral upper extremities Extremities: No edema. Skin: Warm and dry.  LABORATORY DATA:  None for this visit   DIAGNOSTIC IMAGING:  Most recent mammogram: scheduled  in 06/2019    ASSESSMENT AND PLAN:  Ms.. Hall is a pleasant 86 y.o. female with history of Stage IA right breast invasive ductal carcinoma, ER+/PR+/HER2+, diagnosed in 02/2013, treated with lumpectomy, maintenance Trastuzumab, and anti-estrogen therapy with Anastrozole x 5 years completing therapy in 04/2018.  She [facility] for surveillance and routine follow-up.   1. History of breast cancer:  Rebecca Hall is currently clinically and radiographically without evidence of disease or recurrence of breast cancer. Her mammogram is due again in 02/2021.  We will see her in one year for continued f/u.  I encouraged her to call me with any questions or concerns before her next visit at the cancer center, and I would be happy to see her sooner, if needed.   2. Bone health:  Given Rebecca Hall's age, history of breast cancer, and her previous anti-estrogen therapy with Anastrozole, she is at risk for bone demineralization.  She was given education on specific food and activities to promote bone health.  3. Cancer screening:  Due to Rebecca Hall's history and her age, she should receive screening for skin cancers. She was encouraged to follow-up with her PCP for appropriate cancer screenings.   4. Health maintenance and wellness promotion: Rebecca Hall was encouraged to consume 5-7 servings of fruits and vegetables per day. She was also encouraged to engage in moderate to vigorous exercise for 30 minutes per day most days of the week. She was instructed to limit her alcohol consumption and continue to abstain from tobacco use.      Dispo:  -Return to cancer center in one year for LTS follow up -Mammogram due in 02/2021   Note:  [place] [name], [place] [phone]

## 2020-06-22 NOTE — Patient Instructions (Signed)

## 2020-06-26 ENCOUNTER — Telehealth: Payer: Self-pay | Admitting: Adult Health

## 2020-06-26 NOTE — Telephone Encounter (Signed)
Scheduled per 12/30 los. Called pt and left a msg

## 2020-06-27 ENCOUNTER — Other Ambulatory Visit: Payer: Self-pay | Admitting: Oncology

## 2020-06-27 DIAGNOSIS — Z1231 Encounter for screening mammogram for malignant neoplasm of breast: Secondary | ICD-10-CM

## 2020-07-10 DIAGNOSIS — I5032 Chronic diastolic (congestive) heart failure: Secondary | ICD-10-CM | POA: Diagnosis not present

## 2020-07-10 DIAGNOSIS — E782 Mixed hyperlipidemia: Secondary | ICD-10-CM | POA: Diagnosis not present

## 2020-07-10 DIAGNOSIS — N1832 Chronic kidney disease, stage 3b: Secondary | ICD-10-CM | POA: Diagnosis not present

## 2020-07-10 DIAGNOSIS — J440 Chronic obstructive pulmonary disease with acute lower respiratory infection: Secondary | ICD-10-CM | POA: Diagnosis not present

## 2020-07-10 DIAGNOSIS — I129 Hypertensive chronic kidney disease with stage 1 through stage 4 chronic kidney disease, or unspecified chronic kidney disease: Secondary | ICD-10-CM | POA: Diagnosis not present

## 2020-07-10 DIAGNOSIS — E1122 Type 2 diabetes mellitus with diabetic chronic kidney disease: Secondary | ICD-10-CM | POA: Diagnosis not present

## 2020-07-10 DIAGNOSIS — G4700 Insomnia, unspecified: Secondary | ICD-10-CM | POA: Diagnosis not present

## 2020-07-10 DIAGNOSIS — D509 Iron deficiency anemia, unspecified: Secondary | ICD-10-CM | POA: Diagnosis not present

## 2020-07-10 DIAGNOSIS — K219 Gastro-esophageal reflux disease without esophagitis: Secondary | ICD-10-CM | POA: Diagnosis not present

## 2020-07-10 DIAGNOSIS — M810 Age-related osteoporosis without current pathological fracture: Secondary | ICD-10-CM | POA: Diagnosis not present

## 2020-07-10 DIAGNOSIS — E1142 Type 2 diabetes mellitus with diabetic polyneuropathy: Secondary | ICD-10-CM | POA: Diagnosis not present

## 2020-07-17 ENCOUNTER — Ambulatory Visit
Admission: RE | Admit: 2020-07-17 | Discharge: 2020-07-17 | Disposition: A | Discharge status: Home / Self Care | Payer: PPO | Source: Ambulatory Visit | Attending: Geriatric Medicine | Admitting: Geriatric Medicine

## 2020-07-17 ENCOUNTER — Other Ambulatory Visit: Payer: Self-pay | Admitting: Geriatric Medicine

## 2020-07-17 DIAGNOSIS — N1832 Chronic kidney disease, stage 3b: Secondary | ICD-10-CM | POA: Diagnosis not present

## 2020-07-17 DIAGNOSIS — Z7984 Long term (current) use of oral hypoglycemic drugs: Secondary | ICD-10-CM | POA: Diagnosis not present

## 2020-07-17 DIAGNOSIS — Z8709 Personal history of other diseases of the respiratory system: Secondary | ICD-10-CM | POA: Diagnosis not present

## 2020-07-17 DIAGNOSIS — E1142 Type 2 diabetes mellitus with diabetic polyneuropathy: Secondary | ICD-10-CM | POA: Diagnosis not present

## 2020-07-17 DIAGNOSIS — E1122 Type 2 diabetes mellitus with diabetic chronic kidney disease: Secondary | ICD-10-CM | POA: Diagnosis not present

## 2020-07-17 DIAGNOSIS — I5032 Chronic diastolic (congestive) heart failure: Secondary | ICD-10-CM | POA: Diagnosis not present

## 2020-07-17 DIAGNOSIS — I10 Essential (primary) hypertension: Secondary | ICD-10-CM | POA: Diagnosis not present

## 2020-07-17 DIAGNOSIS — R0981 Nasal congestion: Secondary | ICD-10-CM

## 2020-07-17 DIAGNOSIS — E782 Mixed hyperlipidemia: Secondary | ICD-10-CM | POA: Diagnosis not present

## 2020-07-17 DIAGNOSIS — I129 Hypertensive chronic kidney disease with stage 1 through stage 4 chronic kidney disease, or unspecified chronic kidney disease: Secondary | ICD-10-CM | POA: Diagnosis not present

## 2020-07-17 DIAGNOSIS — I700 Atherosclerosis of aorta: Secondary | ICD-10-CM | POA: Diagnosis not present

## 2020-08-01 DIAGNOSIS — N1832 Chronic kidney disease, stage 3b: Secondary | ICD-10-CM | POA: Diagnosis not present

## 2020-08-01 DIAGNOSIS — E1122 Type 2 diabetes mellitus with diabetic chronic kidney disease: Secondary | ICD-10-CM | POA: Diagnosis not present

## 2020-08-04 ENCOUNTER — Other Ambulatory Visit: Payer: Self-pay

## 2020-08-04 ENCOUNTER — Ambulatory Visit
Admission: RE | Admit: 2020-08-04 | Discharge: 2020-08-04 | Disposition: A | Discharge status: Home / Self Care | Payer: PPO | Source: Ambulatory Visit | Attending: Oncology | Admitting: Oncology

## 2020-08-04 DIAGNOSIS — L84 Corns and callosities: Secondary | ICD-10-CM | POA: Diagnosis not present

## 2020-08-04 DIAGNOSIS — M79672 Pain in left foot: Secondary | ICD-10-CM | POA: Diagnosis not present

## 2020-08-04 DIAGNOSIS — E119 Type 2 diabetes mellitus without complications: Secondary | ICD-10-CM | POA: Diagnosis not present

## 2020-08-04 DIAGNOSIS — B351 Tinea unguium: Secondary | ICD-10-CM | POA: Diagnosis not present

## 2020-08-04 DIAGNOSIS — Z1231 Encounter for screening mammogram for malignant neoplasm of breast: Secondary | ICD-10-CM | POA: Diagnosis not present

## 2020-08-04 DIAGNOSIS — M79671 Pain in right foot: Secondary | ICD-10-CM | POA: Diagnosis not present

## 2020-08-14 DIAGNOSIS — R269 Unspecified abnormalities of gait and mobility: Secondary | ICD-10-CM | POA: Diagnosis not present

## 2020-08-14 DIAGNOSIS — I5032 Chronic diastolic (congestive) heart failure: Secondary | ICD-10-CM | POA: Diagnosis not present

## 2020-08-14 DIAGNOSIS — E782 Mixed hyperlipidemia: Secondary | ICD-10-CM | POA: Diagnosis not present

## 2020-08-14 DIAGNOSIS — G4700 Insomnia, unspecified: Secondary | ICD-10-CM | POA: Diagnosis not present

## 2020-08-14 DIAGNOSIS — E1142 Type 2 diabetes mellitus with diabetic polyneuropathy: Secondary | ICD-10-CM | POA: Diagnosis not present

## 2020-08-14 DIAGNOSIS — M810 Age-related osteoporosis without current pathological fracture: Secondary | ICD-10-CM | POA: Diagnosis not present

## 2020-08-14 DIAGNOSIS — F5101 Primary insomnia: Secondary | ICD-10-CM | POA: Diagnosis not present

## 2020-08-14 DIAGNOSIS — D509 Iron deficiency anemia, unspecified: Secondary | ICD-10-CM | POA: Diagnosis not present

## 2020-08-14 DIAGNOSIS — K219 Gastro-esophageal reflux disease without esophagitis: Secondary | ICD-10-CM | POA: Diagnosis not present

## 2020-08-14 DIAGNOSIS — N1832 Chronic kidney disease, stage 3b: Secondary | ICD-10-CM | POA: Diagnosis not present

## 2020-08-14 DIAGNOSIS — M19042 Primary osteoarthritis, left hand: Secondary | ICD-10-CM | POA: Diagnosis not present

## 2020-08-14 DIAGNOSIS — J440 Chronic obstructive pulmonary disease with acute lower respiratory infection: Secondary | ICD-10-CM | POA: Diagnosis not present

## 2020-08-14 DIAGNOSIS — I129 Hypertensive chronic kidney disease with stage 1 through stage 4 chronic kidney disease, or unspecified chronic kidney disease: Secondary | ICD-10-CM | POA: Diagnosis not present

## 2020-08-14 DIAGNOSIS — M19041 Primary osteoarthritis, right hand: Secondary | ICD-10-CM | POA: Diagnosis not present

## 2020-08-14 DIAGNOSIS — E1122 Type 2 diabetes mellitus with diabetic chronic kidney disease: Secondary | ICD-10-CM | POA: Diagnosis not present

## 2020-09-04 DIAGNOSIS — I509 Heart failure, unspecified: Secondary | ICD-10-CM | POA: Diagnosis not present

## 2020-09-04 DIAGNOSIS — E261 Secondary hyperaldosteronism: Secondary | ICD-10-CM | POA: Diagnosis not present

## 2020-09-04 DIAGNOSIS — Z79899 Other long term (current) drug therapy: Secondary | ICD-10-CM | POA: Diagnosis not present

## 2020-09-04 DIAGNOSIS — E1122 Type 2 diabetes mellitus with diabetic chronic kidney disease: Secondary | ICD-10-CM | POA: Diagnosis not present

## 2020-09-04 DIAGNOSIS — N183 Chronic kidney disease, stage 3 unspecified: Secondary | ICD-10-CM | POA: Diagnosis not present

## 2020-09-04 DIAGNOSIS — I130 Hypertensive heart and chronic kidney disease with heart failure and stage 1 through stage 4 chronic kidney disease, or unspecified chronic kidney disease: Secondary | ICD-10-CM | POA: Diagnosis not present

## 2020-09-04 DIAGNOSIS — Z7984 Long term (current) use of oral hypoglycemic drugs: Secondary | ICD-10-CM | POA: Diagnosis not present

## 2020-09-08 DIAGNOSIS — B351 Tinea unguium: Secondary | ICD-10-CM | POA: Diagnosis not present

## 2020-09-08 DIAGNOSIS — E119 Type 2 diabetes mellitus without complications: Secondary | ICD-10-CM | POA: Diagnosis not present

## 2020-09-08 DIAGNOSIS — L84 Corns and callosities: Secondary | ICD-10-CM | POA: Diagnosis not present

## 2020-09-08 DIAGNOSIS — M79671 Pain in right foot: Secondary | ICD-10-CM | POA: Diagnosis not present

## 2020-09-08 DIAGNOSIS — M79672 Pain in left foot: Secondary | ICD-10-CM | POA: Diagnosis not present

## 2020-09-11 DIAGNOSIS — I129 Hypertensive chronic kidney disease with stage 1 through stage 4 chronic kidney disease, or unspecified chronic kidney disease: Secondary | ICD-10-CM | POA: Diagnosis not present

## 2020-09-11 DIAGNOSIS — G4700 Insomnia, unspecified: Secondary | ICD-10-CM | POA: Diagnosis not present

## 2020-09-11 DIAGNOSIS — D509 Iron deficiency anemia, unspecified: Secondary | ICD-10-CM | POA: Diagnosis not present

## 2020-09-11 DIAGNOSIS — M810 Age-related osteoporosis without current pathological fracture: Secondary | ICD-10-CM | POA: Diagnosis not present

## 2020-09-11 DIAGNOSIS — N1832 Chronic kidney disease, stage 3b: Secondary | ICD-10-CM | POA: Diagnosis not present

## 2020-09-11 DIAGNOSIS — E1122 Type 2 diabetes mellitus with diabetic chronic kidney disease: Secondary | ICD-10-CM | POA: Diagnosis not present

## 2020-09-11 DIAGNOSIS — E1142 Type 2 diabetes mellitus with diabetic polyneuropathy: Secondary | ICD-10-CM | POA: Diagnosis not present

## 2020-09-11 DIAGNOSIS — E782 Mixed hyperlipidemia: Secondary | ICD-10-CM | POA: Diagnosis not present

## 2020-09-11 DIAGNOSIS — K219 Gastro-esophageal reflux disease without esophagitis: Secondary | ICD-10-CM | POA: Diagnosis not present

## 2020-09-11 DIAGNOSIS — I5032 Chronic diastolic (congestive) heart failure: Secondary | ICD-10-CM | POA: Diagnosis not present

## 2020-09-11 DIAGNOSIS — J440 Chronic obstructive pulmonary disease with acute lower respiratory infection: Secondary | ICD-10-CM | POA: Diagnosis not present

## 2020-10-02 ENCOUNTER — Other Ambulatory Visit (HOSPITAL_COMMUNITY): Payer: Self-pay | Admitting: Internal Medicine

## 2020-10-04 ENCOUNTER — Other Ambulatory Visit (HOSPITAL_COMMUNITY): Payer: Self-pay

## 2020-10-10 ENCOUNTER — Other Ambulatory Visit (HOSPITAL_COMMUNITY): Payer: Self-pay | Admitting: Internal Medicine

## 2020-10-11 DIAGNOSIS — I5032 Chronic diastolic (congestive) heart failure: Secondary | ICD-10-CM | POA: Diagnosis not present

## 2020-10-11 DIAGNOSIS — D509 Iron deficiency anemia, unspecified: Secondary | ICD-10-CM | POA: Diagnosis not present

## 2020-10-11 DIAGNOSIS — K219 Gastro-esophageal reflux disease without esophagitis: Secondary | ICD-10-CM | POA: Diagnosis not present

## 2020-10-11 DIAGNOSIS — E1142 Type 2 diabetes mellitus with diabetic polyneuropathy: Secondary | ICD-10-CM | POA: Diagnosis not present

## 2020-10-11 DIAGNOSIS — N1832 Chronic kidney disease, stage 3b: Secondary | ICD-10-CM | POA: Diagnosis not present

## 2020-10-11 DIAGNOSIS — E1122 Type 2 diabetes mellitus with diabetic chronic kidney disease: Secondary | ICD-10-CM | POA: Diagnosis not present

## 2020-10-11 DIAGNOSIS — I129 Hypertensive chronic kidney disease with stage 1 through stage 4 chronic kidney disease, or unspecified chronic kidney disease: Secondary | ICD-10-CM | POA: Diagnosis not present

## 2020-10-11 DIAGNOSIS — M810 Age-related osteoporosis without current pathological fracture: Secondary | ICD-10-CM | POA: Diagnosis not present

## 2020-10-11 DIAGNOSIS — E782 Mixed hyperlipidemia: Secondary | ICD-10-CM | POA: Diagnosis not present

## 2020-10-11 DIAGNOSIS — J440 Chronic obstructive pulmonary disease with acute lower respiratory infection: Secondary | ICD-10-CM | POA: Diagnosis not present

## 2020-10-11 DIAGNOSIS — G4700 Insomnia, unspecified: Secondary | ICD-10-CM | POA: Diagnosis not present

## 2020-11-05 ENCOUNTER — Other Ambulatory Visit (HOSPITAL_COMMUNITY): Payer: Self-pay | Admitting: Internal Medicine

## 2020-11-05 DIAGNOSIS — I5022 Chronic systolic (congestive) heart failure: Secondary | ICD-10-CM

## 2020-11-07 DIAGNOSIS — E1142 Type 2 diabetes mellitus with diabetic polyneuropathy: Secondary | ICD-10-CM | POA: Diagnosis not present

## 2020-11-07 DIAGNOSIS — J440 Chronic obstructive pulmonary disease with acute lower respiratory infection: Secondary | ICD-10-CM | POA: Diagnosis not present

## 2020-11-07 DIAGNOSIS — N1832 Chronic kidney disease, stage 3b: Secondary | ICD-10-CM | POA: Diagnosis not present

## 2020-11-07 DIAGNOSIS — D509 Iron deficiency anemia, unspecified: Secondary | ICD-10-CM | POA: Diagnosis not present

## 2020-11-07 DIAGNOSIS — I5032 Chronic diastolic (congestive) heart failure: Secondary | ICD-10-CM | POA: Diagnosis not present

## 2020-11-07 DIAGNOSIS — K219 Gastro-esophageal reflux disease without esophagitis: Secondary | ICD-10-CM | POA: Diagnosis not present

## 2020-11-07 DIAGNOSIS — M810 Age-related osteoporosis without current pathological fracture: Secondary | ICD-10-CM | POA: Diagnosis not present

## 2020-11-07 DIAGNOSIS — E782 Mixed hyperlipidemia: Secondary | ICD-10-CM | POA: Diagnosis not present

## 2020-11-07 DIAGNOSIS — G4700 Insomnia, unspecified: Secondary | ICD-10-CM | POA: Diagnosis not present

## 2020-11-07 DIAGNOSIS — E1122 Type 2 diabetes mellitus with diabetic chronic kidney disease: Secondary | ICD-10-CM | POA: Diagnosis not present

## 2020-11-10 ENCOUNTER — Other Ambulatory Visit (HOSPITAL_COMMUNITY): Payer: Self-pay | Admitting: *Deleted

## 2020-11-10 DIAGNOSIS — R490 Dysphonia: Secondary | ICD-10-CM | POA: Diagnosis not present

## 2020-11-10 DIAGNOSIS — I129 Hypertensive chronic kidney disease with stage 1 through stage 4 chronic kidney disease, or unspecified chronic kidney disease: Secondary | ICD-10-CM | POA: Diagnosis not present

## 2020-11-10 DIAGNOSIS — N1832 Chronic kidney disease, stage 3b: Secondary | ICD-10-CM | POA: Diagnosis not present

## 2020-11-10 DIAGNOSIS — I5022 Chronic systolic (congestive) heart failure: Secondary | ICD-10-CM

## 2020-11-10 DIAGNOSIS — I872 Venous insufficiency (chronic) (peripheral): Secondary | ICD-10-CM | POA: Diagnosis not present

## 2020-11-10 DIAGNOSIS — I5032 Chronic diastolic (congestive) heart failure: Secondary | ICD-10-CM | POA: Diagnosis not present

## 2020-11-10 DIAGNOSIS — R413 Other amnesia: Secondary | ICD-10-CM | POA: Diagnosis not present

## 2020-11-10 DIAGNOSIS — M810 Age-related osteoporosis without current pathological fracture: Secondary | ICD-10-CM | POA: Diagnosis not present

## 2020-11-10 MED ORDER — CARVEDILOL 3.125 MG PO TABS: 3.1250 mg | ORAL_TABLET | Freq: Two times a day (BID) | ORAL | 3 refills | Status: DC

## 2020-12-01 DIAGNOSIS — L84 Corns and callosities: Secondary | ICD-10-CM | POA: Diagnosis not present

## 2020-12-01 DIAGNOSIS — M79671 Pain in right foot: Secondary | ICD-10-CM | POA: Diagnosis not present

## 2020-12-01 DIAGNOSIS — E119 Type 2 diabetes mellitus without complications: Secondary | ICD-10-CM | POA: Diagnosis not present

## 2020-12-01 DIAGNOSIS — B351 Tinea unguium: Secondary | ICD-10-CM | POA: Diagnosis not present

## 2020-12-01 DIAGNOSIS — M79672 Pain in left foot: Secondary | ICD-10-CM | POA: Diagnosis not present

## 2020-12-08 DIAGNOSIS — E1142 Type 2 diabetes mellitus with diabetic polyneuropathy: Secondary | ICD-10-CM | POA: Diagnosis not present

## 2020-12-08 DIAGNOSIS — I5032 Chronic diastolic (congestive) heart failure: Secondary | ICD-10-CM | POA: Diagnosis not present

## 2020-12-08 DIAGNOSIS — M19042 Primary osteoarthritis, left hand: Secondary | ICD-10-CM | POA: Diagnosis not present

## 2020-12-08 DIAGNOSIS — D509 Iron deficiency anemia, unspecified: Secondary | ICD-10-CM | POA: Diagnosis not present

## 2020-12-08 DIAGNOSIS — E1122 Type 2 diabetes mellitus with diabetic chronic kidney disease: Secondary | ICD-10-CM | POA: Diagnosis not present

## 2020-12-08 DIAGNOSIS — I129 Hypertensive chronic kidney disease with stage 1 through stage 4 chronic kidney disease, or unspecified chronic kidney disease: Secondary | ICD-10-CM | POA: Diagnosis not present

## 2020-12-08 DIAGNOSIS — M810 Age-related osteoporosis without current pathological fracture: Secondary | ICD-10-CM | POA: Diagnosis not present

## 2020-12-08 DIAGNOSIS — M19041 Primary osteoarthritis, right hand: Secondary | ICD-10-CM | POA: Diagnosis not present

## 2020-12-08 DIAGNOSIS — K219 Gastro-esophageal reflux disease without esophagitis: Secondary | ICD-10-CM | POA: Diagnosis not present

## 2020-12-08 DIAGNOSIS — J440 Chronic obstructive pulmonary disease with acute lower respiratory infection: Secondary | ICD-10-CM | POA: Diagnosis not present

## 2020-12-08 DIAGNOSIS — E782 Mixed hyperlipidemia: Secondary | ICD-10-CM | POA: Diagnosis not present

## 2020-12-08 DIAGNOSIS — N1832 Chronic kidney disease, stage 3b: Secondary | ICD-10-CM | POA: Diagnosis not present

## 2020-12-12 DIAGNOSIS — K219 Gastro-esophageal reflux disease without esophagitis: Secondary | ICD-10-CM | POA: Diagnosis not present

## 2020-12-12 DIAGNOSIS — R490 Dysphonia: Secondary | ICD-10-CM | POA: Diagnosis not present

## 2020-12-30 ENCOUNTER — Other Ambulatory Visit (HOSPITAL_COMMUNITY): Payer: Self-pay | Admitting: Internal Medicine

## 2021-01-22 DIAGNOSIS — E119 Type 2 diabetes mellitus without complications: Secondary | ICD-10-CM | POA: Diagnosis not present

## 2021-01-22 DIAGNOSIS — Z961 Presence of intraocular lens: Secondary | ICD-10-CM | POA: Diagnosis not present

## 2021-01-22 DIAGNOSIS — H524 Presbyopia: Secondary | ICD-10-CM | POA: Diagnosis not present

## 2021-02-06 DIAGNOSIS — R490 Dysphonia: Secondary | ICD-10-CM | POA: Diagnosis not present

## 2021-02-06 DIAGNOSIS — D509 Iron deficiency anemia, unspecified: Secondary | ICD-10-CM | POA: Diagnosis not present

## 2021-02-06 DIAGNOSIS — Z79899 Other long term (current) drug therapy: Secondary | ICD-10-CM | POA: Diagnosis not present

## 2021-02-06 DIAGNOSIS — Z1389 Encounter for screening for other disorder: Secondary | ICD-10-CM | POA: Diagnosis not present

## 2021-02-06 DIAGNOSIS — I129 Hypertensive chronic kidney disease with stage 1 through stage 4 chronic kidney disease, or unspecified chronic kidney disease: Secondary | ICD-10-CM | POA: Diagnosis not present

## 2021-02-06 DIAGNOSIS — J440 Chronic obstructive pulmonary disease with acute lower respiratory infection: Secondary | ICD-10-CM | POA: Diagnosis not present

## 2021-02-06 DIAGNOSIS — E1122 Type 2 diabetes mellitus with diabetic chronic kidney disease: Secondary | ICD-10-CM | POA: Diagnosis not present

## 2021-02-06 DIAGNOSIS — Z0000 Encounter for general adult medical examination without abnormal findings: Secondary | ICD-10-CM | POA: Diagnosis not present

## 2021-02-06 DIAGNOSIS — B372 Candidiasis of skin and nail: Secondary | ICD-10-CM | POA: Diagnosis not present

## 2021-02-06 DIAGNOSIS — N183 Chronic kidney disease, stage 3 unspecified: Secondary | ICD-10-CM | POA: Diagnosis not present

## 2021-02-06 DIAGNOSIS — K219 Gastro-esophageal reflux disease without esophagitis: Secondary | ICD-10-CM | POA: Diagnosis not present

## 2021-02-06 DIAGNOSIS — N1832 Chronic kidney disease, stage 3b: Secondary | ICD-10-CM | POA: Diagnosis not present

## 2021-02-06 DIAGNOSIS — I700 Atherosclerosis of aorta: Secondary | ICD-10-CM | POA: Diagnosis not present

## 2021-02-06 DIAGNOSIS — E1142 Type 2 diabetes mellitus with diabetic polyneuropathy: Secondary | ICD-10-CM | POA: Diagnosis not present

## 2021-02-06 DIAGNOSIS — I5032 Chronic diastolic (congestive) heart failure: Secondary | ICD-10-CM | POA: Diagnosis not present

## 2021-02-06 DIAGNOSIS — E782 Mixed hyperlipidemia: Secondary | ICD-10-CM | POA: Diagnosis not present

## 2021-02-16 DIAGNOSIS — J440 Chronic obstructive pulmonary disease with acute lower respiratory infection: Secondary | ICD-10-CM | POA: Diagnosis not present

## 2021-02-16 DIAGNOSIS — E782 Mixed hyperlipidemia: Secondary | ICD-10-CM | POA: Diagnosis not present

## 2021-02-16 DIAGNOSIS — N1832 Chronic kidney disease, stage 3b: Secondary | ICD-10-CM | POA: Diagnosis not present

## 2021-02-16 DIAGNOSIS — I5032 Chronic diastolic (congestive) heart failure: Secondary | ICD-10-CM | POA: Diagnosis not present

## 2021-02-16 DIAGNOSIS — D509 Iron deficiency anemia, unspecified: Secondary | ICD-10-CM | POA: Diagnosis not present

## 2021-02-16 DIAGNOSIS — K219 Gastro-esophageal reflux disease without esophagitis: Secondary | ICD-10-CM | POA: Diagnosis not present

## 2021-02-16 DIAGNOSIS — I129 Hypertensive chronic kidney disease with stage 1 through stage 4 chronic kidney disease, or unspecified chronic kidney disease: Secondary | ICD-10-CM | POA: Diagnosis not present

## 2021-02-16 DIAGNOSIS — E1122 Type 2 diabetes mellitus with diabetic chronic kidney disease: Secondary | ICD-10-CM | POA: Diagnosis not present

## 2021-02-16 DIAGNOSIS — E1142 Type 2 diabetes mellitus with diabetic polyneuropathy: Secondary | ICD-10-CM | POA: Diagnosis not present

## 2021-02-16 DIAGNOSIS — M810 Age-related osteoporosis without current pathological fracture: Secondary | ICD-10-CM | POA: Diagnosis not present

## 2021-02-23 ENCOUNTER — Encounter (HOSPITAL_COMMUNITY): Payer: Self-pay | Admitting: Internal Medicine

## 2021-02-23 ENCOUNTER — Other Ambulatory Visit: Payer: Self-pay

## 2021-02-23 ENCOUNTER — Other Ambulatory Visit (HOSPITAL_COMMUNITY): Payer: Self-pay | Admitting: *Deleted

## 2021-02-23 ENCOUNTER — Ambulatory Visit (HOSPITAL_BASED_OUTPATIENT_CLINIC_OR_DEPARTMENT_OTHER)
Admission: RE | Admit: 2021-02-23 | Discharge: 2021-02-23 | Disposition: A | Discharge status: Home / Self Care | Payer: PPO | Source: Ambulatory Visit | Attending: Internal Medicine | Admitting: Internal Medicine

## 2021-02-23 ENCOUNTER — Ambulatory Visit (HOSPITAL_COMMUNITY)
Admission: RE | Admit: 2021-02-23 | Discharge: 2021-02-23 | Disposition: A | Discharge status: Home / Self Care | Payer: PPO | Source: Ambulatory Visit | Attending: Internal Medicine | Admitting: Internal Medicine

## 2021-02-23 VITALS — BP 150/70 | HR 63 | Wt 155.8 lb

## 2021-02-23 DIAGNOSIS — I358 Other nonrheumatic aortic valve disorders: Secondary | ICD-10-CM | POA: Diagnosis not present

## 2021-02-23 DIAGNOSIS — I428 Other cardiomyopathies: Secondary | ICD-10-CM | POA: Diagnosis not present

## 2021-02-23 DIAGNOSIS — K219 Gastro-esophageal reflux disease without esophagitis: Secondary | ICD-10-CM | POA: Insufficient documentation

## 2021-02-23 DIAGNOSIS — I5032 Chronic diastolic (congestive) heart failure: Secondary | ICD-10-CM

## 2021-02-23 DIAGNOSIS — E785 Hyperlipidemia, unspecified: Secondary | ICD-10-CM | POA: Diagnosis not present

## 2021-02-23 DIAGNOSIS — E1142 Type 2 diabetes mellitus with diabetic polyneuropathy: Secondary | ICD-10-CM | POA: Diagnosis not present

## 2021-02-23 DIAGNOSIS — I2510 Atherosclerotic heart disease of native coronary artery without angina pectoris: Secondary | ICD-10-CM | POA: Diagnosis not present

## 2021-02-23 DIAGNOSIS — E1122 Type 2 diabetes mellitus with diabetic chronic kidney disease: Secondary | ICD-10-CM | POA: Diagnosis not present

## 2021-02-23 DIAGNOSIS — Z8719 Personal history of other diseases of the digestive system: Secondary | ICD-10-CM | POA: Diagnosis not present

## 2021-02-23 DIAGNOSIS — N1832 Chronic kidney disease, stage 3b: Secondary | ICD-10-CM | POA: Diagnosis not present

## 2021-02-23 DIAGNOSIS — I130 Hypertensive heart and chronic kidney disease with heart failure and stage 1 through stage 4 chronic kidney disease, or unspecified chronic kidney disease: Secondary | ICD-10-CM | POA: Diagnosis not present

## 2021-02-23 DIAGNOSIS — Z7982 Long term (current) use of aspirin: Secondary | ICD-10-CM | POA: Insufficient documentation

## 2021-02-23 DIAGNOSIS — Z853 Personal history of malignant neoplasm of breast: Secondary | ICD-10-CM | POA: Insufficient documentation

## 2021-02-23 DIAGNOSIS — I10 Essential (primary) hypertension: Secondary | ICD-10-CM | POA: Diagnosis not present

## 2021-02-23 DIAGNOSIS — Z7984 Long term (current) use of oral hypoglycemic drugs: Secondary | ICD-10-CM | POA: Insufficient documentation

## 2021-02-23 DIAGNOSIS — Z79899 Other long term (current) drug therapy: Secondary | ICD-10-CM | POA: Diagnosis not present

## 2021-02-23 LAB — ECHOCARDIOGRAM COMPLETE
Area-P 1/2: 2.24 cm2
S' Lateral: 2.8 cm

## 2021-02-23 MED ORDER — FUROSEMIDE 20 MG PO TABS: ORAL_TABLET | ORAL | 11 refills | Status: DC

## 2021-02-23 NOTE — Patient Instructions (Addendum)
Change Furosemide to 20 mg every other day ALTERNATING with 40 mg every other day   Your physician recommends that you return for lab work in: 2 weeks  Please check BP every Tuesday  Please wear your compression hose daily, place them on as soon as you get up in the morning and remove before you go to bed at night.  Please call our office in July 2022 to schedule a follow up appointment  If you have any questions or concerns before your next appointment please send [place] a message through [place] or call our office at [phone].    TO LEAVE A MESSAGE FOR THE NURSE SELECT OPTION 2, PLEASE LEAVE A MESSAGE INCLUDING: YOUR NAME DATE OF BIRTH CALL BACK NUMBER REASON FOR CALL**this is important as we prioritize the call backs  YOU WILL RECEIVE A CALL BACK THE SAME DAY AS LONG AS YOU CALL BEFORE 4:00 PM  At the [facility], you and your health needs are our priority. As part of our continuing mission to provide you with exceptional heart care, we have created designated Provider Care Teams. These Care Teams include your primary Cardiologist (physician) and Advanced Practice Providers (APPs- Physician Assistants and Nurse Practitioners) who all work together to provide you with the care you need, when you need it.   You may see any of the following providers on your designated Care Team at your next follow up: Dr Glori Bickers Dr Loralie Champagne Dr Patrice Paradise, NP Lyda Jester, [place] Ginnie Smart Audry Riles, PharmD   Please be sure to bring in all your medications bottles to every appointment.

## 2021-02-23 NOTE — Progress Notes (Signed)
ReDS Vest / Clip - 02/23/21 1600       ReDS Vest / Clip   Station Marker A    Ruler Value 26    ReDS Value Range High volume overload    ReDS Actual Value 43

## 2021-02-23 NOTE — Progress Notes (Signed)
Advanced Heart Failure Clinic Note   Patient ID: Rebecca Hall, female   DOB: 1931-09-30, 87 y.o.   MRN: [id number] PCP: Dr Felipa Eth.  Primary HF: Dr Haroldine Laws Oncologist: Dr Jana Hakim  HPI: Rebecca Hall is a 87 y.o. female with history of Breast Cancer treated with 3 doses of herceptin in 2014, CKD, HTN, HL, GERD, Hx of GI Bleed, and DM.  Admitted to [facility] 8/16 with hypovolemic shock, AKI creatinine 4.7. After fluid resuscitation developed pulmonary edema. Found to have new onset LV dysfunction with EF 30-35% in 8/16.. Had cath as noted below with 1v CAD with 60% proximal LAD stenosis and NICM. Discharged on lasix, carvedilol, and Entresto. Discharge weight was 156 pounds. Echo 12/16 EF 55-60%,   Admitted 06/30/15 with weakness and dizziness and found to have recurrent AKI, hyperkalemia, and hypotension. (Creatinine 3.69 and K 7.4 on admit) Recovered with IVF.   Echo 7/18: EF 55-60%  Her husband passed away in 2018-06-13 from [place] and pulmonary fibrosis. Now living at [facility].  Here for routine f/u. Still at [facility]. Says she isn't doing as much as he used to. Feels like she has lost some of her stamina. Not participating in Rehab because "I'm too lazy." No DOE, orthopnea or PND. Has a lot of pedal edema.    Echo today 02/23/21 EF 65-70% Normal RV G1DD  Cardiac studies:  [facility] 01/27/2015  RA = 11 RV = 42/8/15 PA = 38/8 (24) PCW = 19 Fick cardiac output/index = 5.2/3.0 Thermo CO/CI = 5.7/3.2 PVR = 1.0 WU FA sat = 93% PA sat = 51%, 51% Ao Pressure: 133/60 (85) LV Pressure:  133/9/17 No aortic stenosis  Assessment: 1) Moderate 1v CAD with 60% proximal LAD lesion 2) Non-ischemic CM with EF 30-35% by echo 3) Mildly elevated R-sided pressures with normal cardiac output   Review of systems complete and found to be negative unless listed in HPI.    SH:  Social History   Socioeconomic History   Marital status: Widowed    Spouse name: Not on file   Number of  children: Not on file   Years of education: Not on file   Highest education level: Not on file  Occupational History   Not on file  Tobacco Use   Smoking status: Never   Smokeless tobacco: Never  Substance and Sexual Activity   Alcohol use: No   Drug use: No   Sexual activity: Not on file    Comment: first live birth age 37, P 2, Estrogen   Other Topics Concern   Not on file  Social History Narrative   Not on file   Social Determinants of Health   Financial Resource Strain: Not on file  Food Insecurity: Not on file  Transportation Needs: Not on file  Physical Activity: Not on file  Stress: Not on file  Social Connections: Not on file  Intimate Partner Violence: Not on file    FH:  Family History  Problem Relation Age of Onset   Cancer Maternal Aunt 80       colon cancer   Cancer Maternal Uncle 65       lung   Cancer Maternal Grandmother        pancreas   Cerebral aneurysm Father     Past Medical History:  Diagnosis Date   Arthritis    Breast cancer ([place]) 03/15/13   right, 12 o'clock   Chronic kidney disease    Diabetes mellitus without complication ([place])  GERD (gastroesophageal reflux disease)    History of lower GI bleeding    Hyperkalemia 12/21/2015   Hyperlipidemia    Hypertension    IBS (irritable bowel syndrome)    Neuromuscular disorder ([facility])    peripheral neuropathy feet   Osteoporosis    Personal history of chemotherapy     Current Outpatient Medications  Medication Sig Dispense Refill   acetaminophen (TYLENOL) 650 MG CR tablet Take 650 mg by mouth every 8 (eight) hours as needed for pain.     allopurinol (ZYLOPRIM) 100 MG tablet Take 100 mg by mouth daily.     allopurinol (ZYLOPRIM) 300 MG tablet Take 300 mg by mouth daily.     amLODipine (NORVASC) 2.5 MG tablet TAKE ONE TABLET BY MOUTH ONE TIME DAILY 90 tablet 0   aspirin 81 MG tablet Take 81 mg by mouth at bedtime.      atorvastatin (LIPITOR) 40 MG tablet TAKE ONE TABLET BY MOUTH IN THE  MORNING 30 tablet 10   calcium carbonate (OS-CAL) 600 MG TABS tablet Take 600 mg by mouth 2 (two) times daily with a meal.      carvedilol (COREG) 3.125 MG tablet Take 1 tablet (3.125 mg total) by mouth 2 (two) times daily. 180 tablet 3   denosumab (PROLIA) 60 MG/ML SOLN injection Inject 60 mg into the skin every 6 (six) months. Administer in upper arm, thigh, or abdomen     Dulaglutide 0.75 MG/0.5ML SOPN Inject 0.75 mg into the skin once a week.     fenofibrate (TRICOR) 145 MG tablet Take 145 mg by mouth daily.     gabapentin (NEURONTIN) 300 MG capsule TAKE ONE CAPSULE BY MOUTH AT BEDTIME  90 capsule 0   glimepiride (AMARYL) 2 MG tablet Take 2 mg by mouth daily with breakfast.     loratadine (CLARITIN) 10 MG tablet Take 10 mg by mouth every morning.      Melatonin-Pyridoxine (MELATIN PO) Take 5 mg by mouth at bedtime.     metFORMIN (GLUCOPHAGE) 500 MG tablet Take 500 mg by mouth daily.     Multiple Vitamin (MULTIVITAMIN WITH MINERALS) TABS tablet Take 1 tablet by mouth 2 (two) times daily.      omeprazole (PRILOSEC) 20 MG capsule Take 20 mg by mouth daily.      No current facility-administered medications for this encounter.    Vitals:   02/23/21 1435  BP: (!) 150/70  Pulse: 63  SpO2: 94%  Weight: 70.7 kg (155 lb 12.8 oz)    Wt Readings from Last 3 Encounters:  02/23/21 70.7 kg (155 lb 12.8 oz)  06/22/20 67.8 kg (149 lb 8 oz)  11/29/19 70.2 kg (154 lb 12.8 oz)    PHYSICAL EXAM: General:  Elderly  No resp difficulty HEENT: normal Neck: supple. JVP 6. Carotids 2+ bilat; no bruits. No lymphadenopathy or thryomegaly appreciated. Cor: PMI nondisplaced. Regular rate & rhythm. No rubs, gallops or murmurs. Lungs: clear Abdomen: soft, nontender, nondistended. No hepatosplenomegaly. No bruits or masses. Good bowel sounds. Extremities: no cyanosis, clubbing, rash, 2+ edema + varicose veins Neuro: alert & orientedx3, cranial nerves grossly intact. moves all 4 extremities w/o difficulty.  Affect pleasant  ASSESSMENT & PLAN:  1. Chronic diastolic CHF:  - She has history of nonischemic cardiomyopathy (EF 30-35% in 8/16). EF now recovered.  - Echo 12/2016 EF 55-60% Normal LV and RV, mild LVH.  - Echo today 02/23/21 EF 65-70% Normal RV G1DD - Very sedentary. Encouraged her to be more active. NYHA  II - Volume status worse today. Suspect mostly venous insufficiency but ReDS lung reading is elevated at 42%. Encouraged to wear compression hose.  - Will increase lasix from 20 daily to 20mg  daily alternating with 40mg  daily - Continue carvedilol 3.125 mg BID - Off lisinopril.  Would not re-challenge given improvement in LV systolic function.  - Considered SGLT2i but with age would like to avoid overdiuresis and risk of UTI 2. CKD: Stage 3b - Baseline creatinine 1.5-1.8. Followed by Dr. Felipa Eth 3. Hx of Breast Cancer: Only received 3 doses of Herceptin in 2014. Stopped due to possible allergy. 4. DM2 - stable 5. HTN:  - Goal SBP 120-150 - She is mildly elevated. I have asked her to follow BP with BP checks every Tuesday at [facility] 6. HLD:  - Continue lipitor 40 mg daily 7. Heavy snoring:  - Has refused sleep study.  8. CAD:  - Nonobstructive on prior cath. 8/19 LAD 60% mid - No s/s angina - Continue atorva 40  - ContinueASA 81 mg daily.   Glori Bickers, MD  3:07 PM

## 2021-03-02 DIAGNOSIS — M79671 Pain in right foot: Secondary | ICD-10-CM | POA: Diagnosis not present

## 2021-03-02 DIAGNOSIS — M79672 Pain in left foot: Secondary | ICD-10-CM | POA: Diagnosis not present

## 2021-03-02 DIAGNOSIS — L84 Corns and callosities: Secondary | ICD-10-CM | POA: Diagnosis not present

## 2021-03-02 DIAGNOSIS — B351 Tinea unguium: Secondary | ICD-10-CM | POA: Diagnosis not present

## 2021-03-02 DIAGNOSIS — E119 Type 2 diabetes mellitus without complications: Secondary | ICD-10-CM | POA: Diagnosis not present

## 2021-03-12 ENCOUNTER — Other Ambulatory Visit: Payer: Self-pay

## 2021-03-12 ENCOUNTER — Ambulatory Visit (HOSPITAL_COMMUNITY)
Admission: RE | Admit: 2021-03-12 | Discharge: 2021-03-12 | Disposition: A | Discharge status: Home / Self Care | Payer: PPO | Source: Ambulatory Visit | Attending: Internal Medicine | Admitting: Internal Medicine

## 2021-03-12 DIAGNOSIS — I5032 Chronic diastolic (congestive) heart failure: Secondary | ICD-10-CM | POA: Diagnosis not present

## 2021-03-12 LAB — BASIC METABOLIC PANEL
Anion gap: 12 (ref 5–15)
BUN: 23 mg/dL (ref 8–23)
CO2: 22 mmol/L (ref 22–32)
Calcium: 8.9 mg/dL (ref 8.9–10.3)
Chloride: 103 mmol/L (ref 98–111)
Creatinine, Ser: 1.29 mg/dL — ABNORMAL HIGH (ref 0.44–1.00)
GFR, Estimated: 40 mL/min — ABNORMAL LOW (ref >60)
Glucose, Bld: 226 mg/dL — ABNORMAL HIGH (ref 70–99)
Potassium: 3.5 mmol/L (ref 3.5–5.1)
Sodium: 137 mmol/L (ref 135–145)

## 2021-03-14 ENCOUNTER — Telehealth (HOSPITAL_COMMUNITY): Payer: Self-pay | Admitting: *Deleted

## 2021-03-14 NOTE — Telephone Encounter (Signed)
Pt called to report her bp reading as requested by Dr.Bensimhon.  9/6- 137/70 heart rate 72  9/13- 139/65 heart rate 62  9/20- 113/92 heart rate 73  Routed to Dr.Bensimhon

## 2021-03-26 DIAGNOSIS — E119 Type 2 diabetes mellitus without complications: Secondary | ICD-10-CM | POA: Diagnosis not present

## 2021-03-26 DIAGNOSIS — I10 Essential (primary) hypertension: Secondary | ICD-10-CM | POA: Diagnosis not present

## 2021-03-26 DIAGNOSIS — Z7985 Long-term (current) use of injectable non-insulin antidiabetic drugs: Secondary | ICD-10-CM | POA: Diagnosis not present

## 2021-03-26 DIAGNOSIS — Z9011 Acquired absence of right breast and nipple: Secondary | ICD-10-CM | POA: Diagnosis not present

## 2021-03-26 DIAGNOSIS — Z853 Personal history of malignant neoplasm of breast: Secondary | ICD-10-CM | POA: Diagnosis not present

## 2021-03-26 DIAGNOSIS — Z7984 Long term (current) use of oral hypoglycemic drugs: Secondary | ICD-10-CM | POA: Diagnosis not present

## 2021-04-09 ENCOUNTER — Other Ambulatory Visit (HOSPITAL_COMMUNITY): Payer: Self-pay | Admitting: Internal Medicine

## 2021-04-09 DIAGNOSIS — K219 Gastro-esophageal reflux disease without esophagitis: Secondary | ICD-10-CM | POA: Diagnosis not present

## 2021-04-09 DIAGNOSIS — R490 Dysphonia: Secondary | ICD-10-CM | POA: Diagnosis not present

## 2021-04-17 DIAGNOSIS — I129 Hypertensive chronic kidney disease with stage 1 through stage 4 chronic kidney disease, or unspecified chronic kidney disease: Secondary | ICD-10-CM | POA: Diagnosis not present

## 2021-04-17 DIAGNOSIS — M810 Age-related osteoporosis without current pathological fracture: Secondary | ICD-10-CM | POA: Diagnosis not present

## 2021-04-17 DIAGNOSIS — E782 Mixed hyperlipidemia: Secondary | ICD-10-CM | POA: Diagnosis not present

## 2021-04-17 DIAGNOSIS — E1122 Type 2 diabetes mellitus with diabetic chronic kidney disease: Secondary | ICD-10-CM | POA: Diagnosis not present

## 2021-04-17 DIAGNOSIS — E1142 Type 2 diabetes mellitus with diabetic polyneuropathy: Secondary | ICD-10-CM | POA: Diagnosis not present

## 2021-04-17 DIAGNOSIS — N1832 Chronic kidney disease, stage 3b: Secondary | ICD-10-CM | POA: Diagnosis not present

## 2021-04-17 DIAGNOSIS — J440 Chronic obstructive pulmonary disease with acute lower respiratory infection: Secondary | ICD-10-CM | POA: Diagnosis not present

## 2021-04-17 DIAGNOSIS — K219 Gastro-esophageal reflux disease without esophagitis: Secondary | ICD-10-CM | POA: Diagnosis not present

## 2021-04-17 DIAGNOSIS — I5032 Chronic diastolic (congestive) heart failure: Secondary | ICD-10-CM | POA: Diagnosis not present

## 2021-04-17 DIAGNOSIS — D509 Iron deficiency anemia, unspecified: Secondary | ICD-10-CM | POA: Diagnosis not present

## 2021-05-02 DIAGNOSIS — I5032 Chronic diastolic (congestive) heart failure: Secondary | ICD-10-CM | POA: Diagnosis not present

## 2021-05-02 DIAGNOSIS — E1142 Type 2 diabetes mellitus with diabetic polyneuropathy: Secondary | ICD-10-CM | POA: Diagnosis not present

## 2021-05-02 DIAGNOSIS — E1122 Type 2 diabetes mellitus with diabetic chronic kidney disease: Secondary | ICD-10-CM | POA: Diagnosis not present

## 2021-05-02 DIAGNOSIS — J440 Chronic obstructive pulmonary disease with acute lower respiratory infection: Secondary | ICD-10-CM | POA: Diagnosis not present

## 2021-05-02 DIAGNOSIS — D509 Iron deficiency anemia, unspecified: Secondary | ICD-10-CM | POA: Diagnosis not present

## 2021-05-02 DIAGNOSIS — M810 Age-related osteoporosis without current pathological fracture: Secondary | ICD-10-CM | POA: Diagnosis not present

## 2021-05-02 DIAGNOSIS — K219 Gastro-esophageal reflux disease without esophagitis: Secondary | ICD-10-CM | POA: Diagnosis not present

## 2021-05-02 DIAGNOSIS — I129 Hypertensive chronic kidney disease with stage 1 through stage 4 chronic kidney disease, or unspecified chronic kidney disease: Secondary | ICD-10-CM | POA: Diagnosis not present

## 2021-05-02 DIAGNOSIS — E782 Mixed hyperlipidemia: Secondary | ICD-10-CM | POA: Diagnosis not present

## 2021-05-02 DIAGNOSIS — N1832 Chronic kidney disease, stage 3b: Secondary | ICD-10-CM | POA: Diagnosis not present

## 2021-05-10 DIAGNOSIS — M810 Age-related osteoporosis without current pathological fracture: Secondary | ICD-10-CM | POA: Diagnosis not present

## 2021-05-11 DIAGNOSIS — M79671 Pain in right foot: Secondary | ICD-10-CM | POA: Diagnosis not present

## 2021-05-11 DIAGNOSIS — E119 Type 2 diabetes mellitus without complications: Secondary | ICD-10-CM | POA: Diagnosis not present

## 2021-05-11 DIAGNOSIS — B351 Tinea unguium: Secondary | ICD-10-CM | POA: Diagnosis not present

## 2021-05-11 DIAGNOSIS — M79672 Pain in left foot: Secondary | ICD-10-CM | POA: Diagnosis not present

## 2021-05-11 DIAGNOSIS — L84 Corns and callosities: Secondary | ICD-10-CM | POA: Diagnosis not present

## 2021-05-23 DIAGNOSIS — Z808 Family history of malignant neoplasm of other organs or systems: Secondary | ICD-10-CM | POA: Diagnosis not present

## 2021-05-23 DIAGNOSIS — L821 Other seborrheic keratosis: Secondary | ICD-10-CM | POA: Diagnosis not present

## 2021-05-23 DIAGNOSIS — Z85828 Personal history of other malignant neoplasm of skin: Secondary | ICD-10-CM | POA: Diagnosis not present

## 2021-05-23 DIAGNOSIS — D225 Melanocytic nevi of trunk: Secondary | ICD-10-CM | POA: Diagnosis not present

## 2021-05-23 DIAGNOSIS — Z23 Encounter for immunization: Secondary | ICD-10-CM | POA: Diagnosis not present

## 2021-05-23 DIAGNOSIS — L570 Actinic keratosis: Secondary | ICD-10-CM | POA: Diagnosis not present

## 2021-05-23 DIAGNOSIS — L578 Other skin changes due to chronic exposure to nonionizing radiation: Secondary | ICD-10-CM | POA: Diagnosis not present

## 2021-06-20 DIAGNOSIS — Z66 Do not resuscitate: Secondary | ICD-10-CM | POA: Diagnosis not present

## 2021-06-20 DIAGNOSIS — E785 Hyperlipidemia, unspecified: Secondary | ICD-10-CM | POA: Diagnosis not present

## 2021-06-20 DIAGNOSIS — Z7982 Long term (current) use of aspirin: Secondary | ICD-10-CM | POA: Diagnosis not present

## 2021-06-20 DIAGNOSIS — I700 Atherosclerosis of aorta: Secondary | ICD-10-CM | POA: Diagnosis not present

## 2021-06-20 DIAGNOSIS — M109 Gout, unspecified: Secondary | ICD-10-CM | POA: Diagnosis not present

## 2021-06-22 ENCOUNTER — Inpatient Hospital Stay: Payer: PPO | Attending: Adult Health | Admitting: Adult Health

## 2021-06-22 NOTE — Progress Notes (Deleted)
CLINIC:  Survivorship   REASON FOR VISIT:  Routine follow-up for history of breast cancer.   BRIEF ONCOLOGIC HISTORY: Per Dr. Jana Hakim most recent note   87 y.o. [place] woman, status post right breast upper-outer quadrant biopsy 03/15/2013 for a clinical T1c N0, stage IA invasive ductal carcinoma, grade 1 or 2, estrogen receptor 100% positive, progesterone receptor 100% positive, with an MIB-1 of 30%, and HER-2 amplified by CISH, with a ratio of 3.51 and an average HER-2 copy number Rebecca Hall of 6.85   (1) biopsy of 2 suspicious areas in the left breast both showed no evidence of malignancy   (2) right lumpectomy and sentinel lymph node sampling 04/28/2013 showed a pT1b pN0, stage IA invasive ductal carcinoma, grade 2, with negative margins             (a) did not need radiation therapy   (3) trastuzumab started 05/31/2013, given every 3 weeks for total of 9 doses; held as of 07/16/2013 after 3 doses because of possible allergic dermatitis associated with the drug.    (4) anastrozole started 05/08/2013, and 5 years in November 2019             (a) dexa scan 06/10/2013 showed osteopenia with a T score of -2.1             (b) zolendronate started 09/13/2013, repeated 04/17/2015             (c) switched to denosumab/Prolia as of April 2017   (5) congestive heart failure in the setting of sepsis August 2016, with normal repeat echo December 2016     INTERVAL HISTORY:  Rebecca Hall presents to the [facility] today for routine follow-up for her history of breast cancer.  Overall, she reports feeling quite well.    Her most recent mammogram was completed on August 09, 2020 and showed no evidence of malignancy in breast density category B.   REVIEW OF SYSTEMS:  Review of Systems  Constitutional:  Negative for appetite change, chills, fatigue, fever and unexpected weight change.  HENT:   Negative for hearing loss, lump/mass and trouble swallowing.   Eyes:  Negative for eye  problems and icterus.  Respiratory:  Negative for chest tightness, cough and shortness of breath.   Cardiovascular:  Negative for chest pain and palpitations.  Gastrointestinal:  Negative for abdominal distention, abdominal pain, constipation, diarrhea, nausea and vomiting.  Endocrine: Negative for hot flashes.  Genitourinary:  Negative for difficulty urinating.   Musculoskeletal:  Negative for arthralgias.  Skin:  Negative for itching and rash.  Neurological:  Negative for dizziness, extremity weakness, headaches and numbness.  Hematological:  Negative for adenopathy. Does not bruise/bleed easily.  Psychiatric/Behavioral:  Negative for depression. The patient is not nervous/anxious.  Breast: Denies any new nodularity, masses, tenderness, nipple changes, or nipple discharge.    PAST MEDICAL/SURGICAL HISTORY:  Past Medical History:  Diagnosis Date   Arthritis    Breast cancer ([place]) 03/15/13   right, 12 o'clock   Chronic kidney disease    Diabetes mellitus without complication (HCC)    GERD (gastroesophageal reflux disease)    History of lower GI bleeding    Hyperkalemia 12/21/2015   Hyperlipidemia    Hypertension    IBS (irritable bowel syndrome)    Neuromuscular disorder (HCC)    peripheral neuropathy feet   Osteoporosis    Personal history of chemotherapy    Past Surgical History:  Procedure Laterality Date   ABDOMINAL HYSTERECTOMY  1975   No  salpingo-oophorectomy   BREAST BIOPSY Right 2014   malignant   BREAST BIOPSY Left 04/12/2013   benign x2   BREAST LUMPECTOMY Right 2014   BREAST LUMPECTOMY WITH NEEDLE LOCALIZATION AND AXILLARY SENTINEL LYMPH NODE BX Right 04/28/2013   Procedure: BREAST LUMPECTOMY WITH NEEDLE LOCALIZATION AND AXILLARY SENTINEL LYMPH NODE BX;  Surgeon: Rolm Bookbinder, MD;  Location: [facility];  Service: General;  Laterality: Right;   CARDIAC CATHETERIZATION N/A 01/26/2015   Procedure: Right/Left Heart Cath and Coronary Angiography;   Surgeon: Jolaine Artist, MD;  Location: [facility];  Service: Cardiovascular;  Laterality: N/A;   CATARACT EXTRACTION  2009   both   COLONOSCOPY     SKIN CANCER EXCISION     head, face, hand ..multiple years   TONSILLECTOMY       ALLERGIES:  Allergies  Allergen Reactions   Levaquin [Levofloxacin In D5w] Anaphylaxis and Nausea Only    Shaking real bad on medicaton   Codeine Nausea And Vomiting    Pass out   Herceptin [Trastuzumab] Rash    Patient has developed extensive body skin rash following each Herceptin treatment of increased presence following each treatment  that cleared with oral steroids   Sulfa Antibiotics Swelling   Tape Rash    Blisters    Aldactone [Spironolactone] Other (See Comments)    hyperkalemia     CURRENT MEDICATIONS:  Outpatient Encounter Medications as of 06/22/2021  Medication Sig   acetaminophen (TYLENOL) 650 MG CR tablet Take 650 mg by mouth every 8 (eight) hours as needed for pain.   allopurinol (ZYLOPRIM) 100 MG tablet Take 100 mg by mouth daily.   allopurinol (ZYLOPRIM) 300 MG tablet Take 300 mg by mouth daily.   amLODipine (NORVASC) 2.5 MG tablet TAKE ONE TABLET BY MOUTH ONE TIME DAILY   aspirin 81 MG tablet Take 81 mg by mouth at bedtime.    atorvastatin (LIPITOR) 40 MG tablet TAKE ONE TABLET BY MOUTH IN THE MORNING   calcium carbonate (OS-CAL) 600 MG TABS tablet Take 600 mg by mouth 2 (two) times daily with a meal.    carvedilol (COREG) 3.125 MG tablet Take 1 tablet (3.125 mg total) by mouth 2 (two) times daily.   denosumab (PROLIA) 60 MG/ML SOLN injection Inject 60 mg into the skin every 6 (six) months. Administer in upper arm, thigh, or abdomen   Dulaglutide 0.75 MG/0.5ML SOPN Inject 0.75 mg into the skin once a week.   fenofibrate (TRICOR) 145 MG tablet Take 145 mg by mouth daily.   furosemide (LASIX) 20 MG tablet Take 1 tab Daily every other day ALTERNATING with 2 tabs Daily every other day   gabapentin (NEURONTIN) 300 MG  capsule TAKE ONE CAPSULE BY MOUTH AT BEDTIME    glimepiride (AMARYL) 2 MG tablet Take 2 mg by mouth daily with breakfast.   loratadine (CLARITIN) 10 MG tablet Take 10 mg by mouth every morning.    Melatonin-Pyridoxine (MELATIN PO) Take 5 mg by mouth at bedtime.   metFORMIN (GLUCOPHAGE) 500 MG tablet Take 500 mg by mouth daily.   Multiple Vitamin (MULTIVITAMIN WITH MINERALS) TABS tablet Take 1 tablet by mouth 2 (two) times daily.    omeprazole (PRILOSEC) 20 MG capsule Take 20 mg by mouth daily.    No facility-administered encounter medications on file as of 06/22/2021.     ONCOLOGIC FAMILY HISTORY:  Family History  Problem Relation Age of Onset   Cancer Maternal Aunt 33  colon cancer   Cancer Maternal Uncle 65       lung   Cancer Maternal Grandmother        pancreas   Cerebral aneurysm Father     GENETIC COUNSELING/TESTING: Not at this time  SOCIAL HISTORY:  Social History   Socioeconomic History   Marital status: Widowed    Spouse name: Not on file   Number of children: Not on file   Years of education: Not on file   Highest education level: Not on file  Occupational History   Not on file  Tobacco Use   Smoking status: Never   Smokeless tobacco: Never  Substance and Sexual Activity   Alcohol use: No   Drug use: No   Sexual activity: Not on file    Comment: first live birth age 24, P 2, Estrogen   Other Topics Concern   Not on file  Social History Narrative   Not on file   Social Determinants of Health   Financial Resource Strain: Not on file  Food Insecurity: Not on file  Transportation Needs: Not on file  Physical Activity: Not on file  Stress: Not on file  Social Connections: Not on file  Intimate Partner Violence: Not on file      PHYSICAL EXAMINATION:  Vital Signs: There were no vitals filed for this visit.  There were no vitals filed for this visit.  General: Well-nourished, well-appearing female in no acute distress.  Unaccompanied  today.   HEENT: Head is normocephalic.  Pupils equal and reactive to light. Conjunctivae clear without exudate.  Sclerae anicteric. Oral mucosa is pink, moist.  Oropharynx is pink without lesions or erythema.  Lymph: No cervical, supraclavicular, or infraclavicular lymphadenopathy noted on palpation.  Cardiovascular: Regular rate and rhythm.Marland Kitchen Respiratory: Clear to auscultation bilaterally. Chest expansion symmetric; breathing non-labored.  Breast Exam:  -Left breast: No appreciable masses on palpation. No skin redness, thickening, or peau d'orange appearance; no nipple retraction or nipple discharge;.  -Right breast: No appreciable masses on palpation. No skin redness, thickening, or peau d'orange appearance; no nipple retraction or nipple discharge; mild distortion in symmetry at previous lumpectomy site well healed scar without erythema or nodularity. -Axilla: No axillary adenopathy bilaterally.  GI: Abdomen soft and round; non-tender, non-distended. Bowel sounds normoactive. No hepatosplenomegaly.   GU: Deferred.  Neuro: No focal deficits. Steady gait.  Psych: Mood and affect normal and appropriate for situation.  MSK: No focal spinal tenderness to palpation, full range of motion in bilateral upper extremities Extremities: No edema. Skin: Warm and dry.  LABORATORY DATA:  None for this visit   DIAGNOSTIC IMAGING:  Most recent mammogram: scheduled in 06/2019    ASSESSMENT AND PLAN:  Rebecca Hall is a pleasant 87 y.o. female with history of Stage IA right breast invasive ductal carcinoma, ER+/PR+/HER2+, diagnosed in 02/2013, treated with lumpectomy, maintenance Trastuzumab, and anti-estrogen therapy with Anastrozole x 5 years completing therapy in 04/2018.  [facility] and routine follow-up.   1. History of breast cancer:  Rebecca Hall is currently clinically and radiographically without evidence of disease or recurrence of breast cancer. Her  mammogram is due again in 02/2021.  We will see her in one year for continued f/u.  I encouraged her to call me with any questions or concerns before her next visit at the cancer center, and I would be happy to see her sooner, if needed.   2. Bone health:  Given Rebecca Hall's age, history  of breast cancer, and her previous anti-estrogen therapy with Anastrozole, she is at risk for bone demineralization.  She was given education on specific food and activities to promote bone health.  3. Cancer screening:  Due to Rebecca Hall's history and her age, she should receive screening for skin cancers. She was encouraged to follow-up with her PCP for appropriate cancer screenings.   4. Health maintenance and wellness promotion: Rebecca Hall was encouraged to consume 5-7 servings of fruits and vegetables per day. She was also encouraged to engage in moderate to vigorous exercise for 30 minutes per day most days of the week. She was instructed to limit her alcohol consumption and continue to abstain from tobacco use.      Dispo:  -Return to cancer center in one year for LTS follow up -Mammogram due in 02/2021   Note: [place] [name], [place] [phone]

## 2021-07-03 DIAGNOSIS — E1142 Type 2 diabetes mellitus with diabetic polyneuropathy: Secondary | ICD-10-CM | POA: Diagnosis not present

## 2021-07-03 DIAGNOSIS — J440 Chronic obstructive pulmonary disease with acute lower respiratory infection: Secondary | ICD-10-CM | POA: Diagnosis not present

## 2021-07-03 DIAGNOSIS — K219 Gastro-esophageal reflux disease without esophagitis: Secondary | ICD-10-CM | POA: Diagnosis not present

## 2021-07-03 DIAGNOSIS — N1832 Chronic kidney disease, stage 3b: Secondary | ICD-10-CM | POA: Diagnosis not present

## 2021-07-03 DIAGNOSIS — I129 Hypertensive chronic kidney disease with stage 1 through stage 4 chronic kidney disease, or unspecified chronic kidney disease: Secondary | ICD-10-CM | POA: Diagnosis not present

## 2021-07-03 DIAGNOSIS — E1122 Type 2 diabetes mellitus with diabetic chronic kidney disease: Secondary | ICD-10-CM | POA: Diagnosis not present

## 2021-07-03 DIAGNOSIS — I5032 Chronic diastolic (congestive) heart failure: Secondary | ICD-10-CM | POA: Diagnosis not present

## 2021-07-03 DIAGNOSIS — M810 Age-related osteoporosis without current pathological fracture: Secondary | ICD-10-CM | POA: Diagnosis not present

## 2021-07-03 DIAGNOSIS — E782 Mixed hyperlipidemia: Secondary | ICD-10-CM | POA: Diagnosis not present

## 2021-07-16 ENCOUNTER — Other Ambulatory Visit: Payer: Self-pay | Admitting: Geriatric Medicine

## 2021-07-16 DIAGNOSIS — Z1231 Encounter for screening mammogram for malignant neoplasm of breast: Secondary | ICD-10-CM

## 2021-08-02 ENCOUNTER — Encounter: Payer: Self-pay | Admitting: Adult Health

## 2021-08-02 ENCOUNTER — Other Ambulatory Visit: Payer: Self-pay

## 2021-08-02 ENCOUNTER — Inpatient Hospital Stay: Payer: PPO | Attending: Adult Health | Admitting: Adult Health

## 2021-08-02 VITALS — BP 123/53 | HR 73 | Temp 97.2°F | Resp 18 | Ht 61.0 in | Wt 153.2 lb

## 2021-08-02 DIAGNOSIS — Z85828 Personal history of other malignant neoplasm of skin: Secondary | ICD-10-CM | POA: Diagnosis not present

## 2021-08-02 DIAGNOSIS — Z170 Estrogen receptor positive status [ER+]: Secondary | ICD-10-CM | POA: Diagnosis not present

## 2021-08-02 DIAGNOSIS — M8580 Other specified disorders of bone density and structure, unspecified site: Secondary | ICD-10-CM | POA: Diagnosis not present

## 2021-08-02 DIAGNOSIS — Z87891 Personal history of nicotine dependence: Secondary | ICD-10-CM | POA: Diagnosis not present

## 2021-08-02 DIAGNOSIS — Z79899 Other long term (current) drug therapy: Secondary | ICD-10-CM | POA: Diagnosis not present

## 2021-08-02 DIAGNOSIS — C50411 Malignant neoplasm of upper-outer quadrant of right female breast: Secondary | ICD-10-CM | POA: Diagnosis not present

## 2021-08-02 DIAGNOSIS — Z853 Personal history of malignant neoplasm of breast: Secondary | ICD-10-CM | POA: Diagnosis not present

## 2021-08-02 NOTE — Progress Notes (Signed)
CLINIC:  Survivorship   REASON FOR VISIT:  Routine follow-up for history of breast cancer.   BRIEF ONCOLOGIC HISTORY: Per Dr. Jana Hakim most recent note   88 y.o. [place] woman, status post right breast upper-outer quadrant biopsy 03/15/2013 for a clinical T1c N0, stage IA invasive ductal carcinoma, grade 1 or 2, estrogen receptor 100% positive, progesterone receptor 100% positive, with an MIB-1 of 30%, and HER-2 amplified by CISH, with a ratio of 3.51 and an average HER-2 copy number Damita Lack of 6.85   (1) biopsy of 2 suspicious areas in the left breast both showed no evidence of malignancy   (2) right lumpectomy and sentinel lymph node sampling 04/28/2013 showed a pT1b pN0, stage IA invasive ductal carcinoma, grade 2, with negative margins             (a) did not need radiation therapy   (3) trastuzumab started 05/31/2013, given every 3 weeks for total of 9 doses; held as of 07/16/2013 after 3 doses because of possible allergic dermatitis associated with the drug.    (4) anastrozole started 05/08/2013, and 5 years in November 2019             (a) dexa scan 06/10/2013 showed osteopenia with a T score of -2.1             (b) zolendronate started 09/13/2013, repeated 04/17/2015             (c) switched to denosumab/Prolia as of April 2017   (5) congestive heart failure in the setting of sepsis August 2016, with normal repeat echo December 2016     INTERVAL HISTORY:  Ms. Fuentes presents to the [facility] today for routine follow-up for her history of breast cancer.  Overall, she reports feeling quite well.   Her mammogram is scheduled on August 07, 2021 at the breast center.  She reports that she is doing well and has no breast concerns today.  She continues to see Dr. Felipa Eth who is her primary care provider.  She has no significant health changes this past year other than she notes that she has been working on improving her blood sugar control.  She let me know that  she remains active by walking around her apartment.  However she does not intentionally exercise.   REVIEW OF SYSTEMS:  Review of Systems  Constitutional:  Negative for appetite change, chills, fatigue, fever and unexpected weight change.  HENT:   Negative for hearing loss, lump/mass and trouble swallowing.   Eyes:  Negative for eye problems and icterus.  Respiratory:  Negative for chest tightness, cough and shortness of breath.   Cardiovascular:  Negative for chest pain and palpitations.  Gastrointestinal:  Negative for abdominal distention, abdominal pain, constipation, diarrhea, nausea and vomiting.  Endocrine: Negative for hot flashes.  Genitourinary:  Negative for difficulty urinating.   Musculoskeletal:  Negative for arthralgias.  Skin:  Negative for itching and rash.  Neurological:  Negative for dizziness, extremity weakness, headaches and numbness.  Hematological:  Negative for adenopathy. Does not bruise/bleed easily.  Psychiatric/Behavioral:  Negative for depression. The patient is not nervous/anxious.  Breast: Denies any new nodularity, masses, tenderness, nipple changes, or nipple discharge.    PAST MEDICAL/SURGICAL HISTORY:  Past Medical History:  Diagnosis Date   Arthritis    Breast cancer ([place]) 03/15/13   right, 12 o'clock   Chronic kidney disease    Diabetes mellitus without complication (HCC)    GERD (gastroesophageal reflux disease)    History of  lower GI bleeding    Hyperkalemia 12/21/2015   Hyperlipidemia    Hypertension    IBS (irritable bowel syndrome)    Neuromuscular disorder ([place])    peripheral neuropathy feet   Osteoporosis    Personal history of chemotherapy    Past Surgical History:  Procedure Laterality Date   ABDOMINAL HYSTERECTOMY  1975   No salpingo-oophorectomy   BREAST BIOPSY Right 2014   malignant   BREAST BIOPSY Left 04/12/2013   benign x2   BREAST LUMPECTOMY Right 2014   BREAST LUMPECTOMY WITH NEEDLE LOCALIZATION AND AXILLARY  SENTINEL LYMPH NODE BX Right 04/28/2013   Procedure: BREAST LUMPECTOMY WITH NEEDLE LOCALIZATION AND AXILLARY SENTINEL LYMPH NODE BX;  Surgeon: Rolm Bookbinder, MD;  Location: [facility];  Service: General;  Laterality: Right;   CARDIAC CATHETERIZATION N/A 01/26/2015   Procedure: Right/Left Heart Cath and Coronary Angiography;  Surgeon: Jolaine Artist, MD;  Location: [facility];  Service: Cardiovascular;  Laterality: N/A;   CATARACT EXTRACTION  2009   both   COLONOSCOPY     SKIN CANCER EXCISION     head, face, hand ..multiple years   TONSILLECTOMY       ALLERGIES:  Allergies  Allergen Reactions   Levaquin [Levofloxacin In D5w] Anaphylaxis and Nausea Only    Shaking real bad on medicaton   Codeine Nausea And Vomiting    Pass out   Herceptin [Trastuzumab] Rash    Patient has developed extensive body skin rash following each Herceptin treatment of increased presence following each treatment  that cleared with oral steroids   Sulfa Antibiotics Swelling   Tape Rash    Blisters    Aldactone [Spironolactone] Other (See Comments)    hyperkalemia   Sulfamethoxazole-Trimethoprim Other (See Comments)     CURRENT MEDICATIONS:  Outpatient Encounter Medications as of 08/02/2021  Medication Sig   acetaminophen (TYLENOL) 650 MG CR tablet Take 650 mg by mouth every 8 (eight) hours as needed for pain.   allopurinol (ZYLOPRIM) 100 MG tablet Take 100 mg by mouth daily.   allopurinol (ZYLOPRIM) 300 MG tablet Take 300 mg by mouth daily.   amLODipine (NORVASC) 2.5 MG tablet TAKE ONE TABLET BY MOUTH ONE TIME DAILY   aspirin 81 MG tablet Take 81 mg by mouth at bedtime.    atorvastatin (LIPITOR) 40 MG tablet TAKE ONE TABLET BY MOUTH IN THE MORNING   calcium carbonate (OS-CAL) 600 MG TABS tablet Take 600 mg by mouth 2 (two) times daily with a meal.    carvedilol (COREG) 3.125 MG tablet Take 1 tablet (3.125 mg total) by mouth 2 (two) times daily.   denosumab (PROLIA) 60 MG/ML  SOLN injection Inject 60 mg into the skin every 6 (six) months. Administer in upper arm, thigh, or abdomen   Dulaglutide 0.75 MG/0.5ML SOPN Inject 0.75 mg into the skin once a week.   fenofibrate (TRICOR) 145 MG tablet Take 145 mg by mouth daily.   furosemide (LASIX) 20 MG tablet Take 1 tab Daily every other day ALTERNATING with 2 tabs Daily every other day   gabapentin (NEURONTIN) 300 MG capsule TAKE ONE CAPSULE BY MOUTH AT BEDTIME    glimepiride (AMARYL) 2 MG tablet Take 2 mg by mouth daily with breakfast.   loratadine (CLARITIN) 10 MG tablet Take 10 mg by mouth every morning.    Melatonin-Pyridoxine (MELATIN PO) Take 5 mg by mouth at bedtime.   metFORMIN (GLUCOPHAGE) 500 MG tablet Take 500 mg by mouth daily.   Multiple  Vitamin (MULTIVITAMIN WITH MINERALS) TABS tablet Take 1 tablet by mouth 2 (two) times daily.    omeprazole (PRILOSEC) 20 MG capsule Take 20 mg by mouth daily.    No facility-administered encounter medications on file as of 08/02/2021.     ONCOLOGIC FAMILY HISTORY:  Family History  Problem Relation Age of Onset   Cancer Maternal Aunt 80       colon cancer   Cancer Maternal Uncle 83       lung   Cancer Maternal Grandmother        pancreas   Cerebral aneurysm Father     GENETIC COUNSELING/TESTING: Not at this time  SOCIAL HISTORY:  Social History   Socioeconomic History   Marital status: Widowed    Spouse name: Not on file   Number of children: Not on file   Years of education: Not on file   Highest education level: Not on file  Occupational History   Not on file  Tobacco Use   Smoking status: Never   Smokeless tobacco: Never  Substance and Sexual Activity   Alcohol use: No   Drug use: No   Sexual activity: Not on file    Comment: first live birth age 62, P 2, Estrogen   Other Topics Concern   Not on file  Social History Narrative   Not on file   Social Determinants of Health   Financial Resource Strain: Not on file  Food Insecurity: Not on file   Transportation Needs: Not on file  Physical Activity: Not on file  Stress: Not on file  Social Connections: Not on file  Intimate Partner Violence: Not on file      PHYSICAL EXAMINATION:  Vital Signs: Vitals:   08/02/21 1354  BP: (!) 123/53  Pulse: 73  Resp: 18  Temp: (!) 97.2 F (36.2 C)  SpO2: 95%   Filed Weights   08/02/21 1354  Weight: 153 lb 3.2 oz (69.5 kg)   General: Well-nourished, well-appearing female in no acute distress.  Unaccompanied today.   HEENT: Head is normocephalic.  Pupils equal and reactive to light. Conjunctivae clear without exudate.  Sclerae anicteric. Oral mucosa is pink, moist.  Oropharynx is pink without lesions or erythema.  Lymph: No cervical, supraclavicular, or infraclavicular lymphadenopathy noted on palpation.  Cardiovascular: Regular rate and rhythm.Marland Kitchen Respiratory: Clear to auscultation bilaterally. Chest expansion symmetric; breathing non-labored.  Breast Exam:  -Left breast: No appreciable masses on palpation. No skin redness, thickening, or peau d'orange appearance; no nipple retraction or nipple discharge;.  -Right breast: No appreciable masses on palpation. No skin redness, thickening, or peau d'orange appearance; no nipple retraction or nipple discharge; mild distortion in symmetry at previous lumpectomy site well healed scar without erythema or nodularity. -Axilla: No axillary adenopathy bilaterally.  GI: Abdomen soft and round; non-tender, non-distended. Bowel sounds normoactive. No hepatosplenomegaly.   GU: Deferred.  Neuro: No focal deficits. Steady gait.  Psych: Mood and affect normal and appropriate for situation.  MSK: No focal spinal tenderness to palpation, full range of motion in bilateral upper extremities Extremities: No edema. Skin: Warm and dry.  LABORATORY DATA:  None for this visit   DIAGNOSTIC IMAGING:  Most recent mammogram: scheduled in 08/07/2021    ASSESSMENT AND PLAN:  Ms.. Bouza is a pleasant 88 y.o.  female with history of Stage IA right breast invasive ductal carcinoma, ER+/PR+/HER2+, diagnosed in 02/2013, treated with lumpectomy, maintenance Trastuzumab, and anti-estrogen therapy with Anastrozole x 5 years completing therapy in 04/2018.  She  [facility] for surveillance and routine follow-up.   1. History of breast cancer:  Ms. Snodgrass is currently clinically and radiographically without evidence of disease or recurrence of breast cancer. Her mammogram is scheduled for next week.  She will return in one year for continued surveillance and monitoring.  I encouraged her to call me with any questions or concerns before her next visit at the cancer center, and I would be happy to see her sooner, if needed.   2. Bone health:  Given Ms. Steckman's age, history of breast cancer, and her previous anti-estrogen therapy with Anastrozole, she is at risk for bone demineralization.  She was given education on specific food and activities to promote bone health.  WE have reached out to her PCP to obtain bone density testing results if they have been completed.   3. Cancer screening:  Due to Ms. Carmical's history and her age, she should receive screening for skin cancers. She was encouraged to follow-up with her PCP for appropriate cancer screenings.   4. Health maintenance and wellness promotion: Ms. Totzke was encouraged to consume 5-7 servings of fruits and vegetables per day. She was also encouraged to engage in moderate to vigorous exercise for 30 minutes per day most days of the week. She was instructed to limit her alcohol consumption and continue to abstain from tobacco use.      Dispo:  -Return to cancer center in one year for LTS follow up -Mammogram due in 08/07/2021  Total encounter time: 20 minutes in face to face visit time, chart review, care coordination, and documentation of the encounter.   Wilber Bihari, NP 08/02/21 10:54 PM Medical Oncology and Hematology [facility] [place], [place] [address] Tel. [phone]    Fax. [phone]  *Total Encounter Time as defined by the Centers for Medicare and Medicaid Services includes, in addition to the face-to-face time of a patient visit (documented in the note above) non-face-to-face time: obtaining and reviewing outside history, ordering and reviewing medications, tests or procedures, care coordination (communications with other health care professionals or caregivers) and documentation in the medical record.    Note: PRIMARY CARE PROVIDER [name], [place] [phone]

## 2021-08-06 ENCOUNTER — Ambulatory Visit: Payer: PPO

## 2021-08-06 DIAGNOSIS — I5032 Chronic diastolic (congestive) heart failure: Secondary | ICD-10-CM | POA: Diagnosis not present

## 2021-08-06 DIAGNOSIS — E1122 Type 2 diabetes mellitus with diabetic chronic kidney disease: Secondary | ICD-10-CM | POA: Diagnosis not present

## 2021-08-06 DIAGNOSIS — E782 Mixed hyperlipidemia: Secondary | ICD-10-CM | POA: Diagnosis not present

## 2021-08-06 DIAGNOSIS — I129 Hypertensive chronic kidney disease with stage 1 through stage 4 chronic kidney disease, or unspecified chronic kidney disease: Secondary | ICD-10-CM | POA: Diagnosis not present

## 2021-08-07 ENCOUNTER — Ambulatory Visit
Admission: RE | Admit: 2021-08-07 | Discharge: 2021-08-07 | Disposition: A | Discharge status: Home / Self Care | Payer: PPO | Source: Ambulatory Visit | Attending: Geriatric Medicine | Admitting: Geriatric Medicine

## 2021-08-07 ENCOUNTER — Other Ambulatory Visit: Payer: Self-pay

## 2021-08-07 DIAGNOSIS — Z1231 Encounter for screening mammogram for malignant neoplasm of breast: Secondary | ICD-10-CM

## 2021-08-15 DIAGNOSIS — E1142 Type 2 diabetes mellitus with diabetic polyneuropathy: Secondary | ICD-10-CM | POA: Diagnosis not present

## 2021-08-15 DIAGNOSIS — J440 Chronic obstructive pulmonary disease with acute lower respiratory infection: Secondary | ICD-10-CM | POA: Diagnosis not present

## 2021-08-15 DIAGNOSIS — I129 Hypertensive chronic kidney disease with stage 1 through stage 4 chronic kidney disease, or unspecified chronic kidney disease: Secondary | ICD-10-CM | POA: Diagnosis not present

## 2021-08-15 DIAGNOSIS — M25551 Pain in right hip: Secondary | ICD-10-CM | POA: Diagnosis not present

## 2021-08-15 DIAGNOSIS — H903 Sensorineural hearing loss, bilateral: Secondary | ICD-10-CM | POA: Diagnosis not present

## 2021-08-15 DIAGNOSIS — N1832 Chronic kidney disease, stage 3b: Secondary | ICD-10-CM | POA: Diagnosis not present

## 2021-08-15 DIAGNOSIS — E1122 Type 2 diabetes mellitus with diabetic chronic kidney disease: Secondary | ICD-10-CM | POA: Diagnosis not present

## 2021-08-15 DIAGNOSIS — M25552 Pain in left hip: Secondary | ICD-10-CM | POA: Diagnosis not present

## 2021-08-31 DIAGNOSIS — E119 Type 2 diabetes mellitus without complications: Secondary | ICD-10-CM | POA: Diagnosis not present

## 2021-08-31 DIAGNOSIS — B351 Tinea unguium: Secondary | ICD-10-CM | POA: Diagnosis not present

## 2021-08-31 DIAGNOSIS — M79671 Pain in right foot: Secondary | ICD-10-CM | POA: Diagnosis not present

## 2021-08-31 DIAGNOSIS — L84 Corns and callosities: Secondary | ICD-10-CM | POA: Diagnosis not present

## 2021-08-31 DIAGNOSIS — M79672 Pain in left foot: Secondary | ICD-10-CM | POA: Diagnosis not present

## 2021-09-10 ENCOUNTER — Ambulatory Visit
Admission: RE | Admit: 2021-09-10 | Discharge: 2021-09-10 | Disposition: A | Discharge status: Home / Self Care | Payer: PPO | Source: Ambulatory Visit | Attending: Internal Medicine | Admitting: Internal Medicine

## 2021-09-10 ENCOUNTER — Other Ambulatory Visit: Payer: Self-pay | Admitting: Internal Medicine

## 2021-09-10 DIAGNOSIS — M7731 Calcaneal spur, right foot: Secondary | ICD-10-CM | POA: Diagnosis not present

## 2021-09-10 DIAGNOSIS — M79671 Pain in right foot: Secondary | ICD-10-CM

## 2021-09-10 DIAGNOSIS — M19071 Primary osteoarthritis, right ankle and foot: Secondary | ICD-10-CM | POA: Diagnosis not present

## 2021-09-10 DIAGNOSIS — E1142 Type 2 diabetes mellitus with diabetic polyneuropathy: Secondary | ICD-10-CM | POA: Diagnosis not present

## 2021-09-10 DIAGNOSIS — I5032 Chronic diastolic (congestive) heart failure: Secondary | ICD-10-CM | POA: Diagnosis not present

## 2021-09-18 DIAGNOSIS — M6528 Calcific tendinitis, other site: Secondary | ICD-10-CM | POA: Diagnosis not present

## 2021-09-20 DIAGNOSIS — M5459 Other low back pain: Secondary | ICD-10-CM | POA: Diagnosis not present

## 2021-09-20 DIAGNOSIS — R2681 Unsteadiness on feet: Secondary | ICD-10-CM | POA: Diagnosis not present

## 2021-09-20 DIAGNOSIS — M79671 Pain in right foot: Secondary | ICD-10-CM | POA: Diagnosis not present

## 2021-09-20 DIAGNOSIS — R2689 Other abnormalities of gait and mobility: Secondary | ICD-10-CM | POA: Diagnosis not present

## 2021-09-20 DIAGNOSIS — M6281 Muscle weakness (generalized): Secondary | ICD-10-CM | POA: Diagnosis not present

## 2021-09-20 DIAGNOSIS — E1122 Type 2 diabetes mellitus with diabetic chronic kidney disease: Secondary | ICD-10-CM | POA: Diagnosis not present

## 2021-09-20 DIAGNOSIS — M1380 Other specified arthritis, unspecified site: Secondary | ICD-10-CM | POA: Diagnosis not present

## 2021-09-21 DIAGNOSIS — Z6830 Body mass index (BMI) 30.0-30.9, adult: Secondary | ICD-10-CM | POA: Diagnosis not present

## 2021-09-21 DIAGNOSIS — E1122 Type 2 diabetes mellitus with diabetic chronic kidney disease: Secondary | ICD-10-CM | POA: Diagnosis not present

## 2021-09-21 DIAGNOSIS — I509 Heart failure, unspecified: Secondary | ICD-10-CM | POA: Diagnosis not present

## 2021-09-21 DIAGNOSIS — N183 Chronic kidney disease, stage 3 unspecified: Secondary | ICD-10-CM | POA: Diagnosis not present

## 2021-09-21 DIAGNOSIS — Z7984 Long term (current) use of oral hypoglycemic drugs: Secondary | ICD-10-CM | POA: Diagnosis not present

## 2021-09-21 DIAGNOSIS — Z7985 Long-term (current) use of injectable non-insulin antidiabetic drugs: Secondary | ICD-10-CM | POA: Diagnosis not present

## 2021-09-24 DIAGNOSIS — M6281 Muscle weakness (generalized): Secondary | ICD-10-CM | POA: Diagnosis not present

## 2021-09-24 DIAGNOSIS — R2681 Unsteadiness on feet: Secondary | ICD-10-CM | POA: Diagnosis not present

## 2021-09-24 DIAGNOSIS — R2689 Other abnormalities of gait and mobility: Secondary | ICD-10-CM | POA: Diagnosis not present

## 2021-09-24 DIAGNOSIS — M1380 Other specified arthritis, unspecified site: Secondary | ICD-10-CM | POA: Diagnosis not present

## 2021-09-24 DIAGNOSIS — E1122 Type 2 diabetes mellitus with diabetic chronic kidney disease: Secondary | ICD-10-CM | POA: Diagnosis not present

## 2021-09-24 DIAGNOSIS — M5459 Other low back pain: Secondary | ICD-10-CM | POA: Diagnosis not present

## 2021-09-24 DIAGNOSIS — M79671 Pain in right foot: Secondary | ICD-10-CM | POA: Diagnosis not present

## 2021-09-26 DIAGNOSIS — E1122 Type 2 diabetes mellitus with diabetic chronic kidney disease: Secondary | ICD-10-CM | POA: Diagnosis not present

## 2021-09-26 DIAGNOSIS — R2689 Other abnormalities of gait and mobility: Secondary | ICD-10-CM | POA: Diagnosis not present

## 2021-09-26 DIAGNOSIS — M1380 Other specified arthritis, unspecified site: Secondary | ICD-10-CM | POA: Diagnosis not present

## 2021-09-26 DIAGNOSIS — M5459 Other low back pain: Secondary | ICD-10-CM | POA: Diagnosis not present

## 2021-09-26 DIAGNOSIS — R2681 Unsteadiness on feet: Secondary | ICD-10-CM | POA: Diagnosis not present

## 2021-09-26 DIAGNOSIS — M6281 Muscle weakness (generalized): Secondary | ICD-10-CM | POA: Diagnosis not present

## 2021-09-26 DIAGNOSIS — M79671 Pain in right foot: Secondary | ICD-10-CM | POA: Diagnosis not present

## 2021-09-28 DIAGNOSIS — R2681 Unsteadiness on feet: Secondary | ICD-10-CM | POA: Diagnosis not present

## 2021-09-28 DIAGNOSIS — E1122 Type 2 diabetes mellitus with diabetic chronic kidney disease: Secondary | ICD-10-CM | POA: Diagnosis not present

## 2021-09-28 DIAGNOSIS — R2689 Other abnormalities of gait and mobility: Secondary | ICD-10-CM | POA: Diagnosis not present

## 2021-09-28 DIAGNOSIS — M5459 Other low back pain: Secondary | ICD-10-CM | POA: Diagnosis not present

## 2021-09-28 DIAGNOSIS — M1380 Other specified arthritis, unspecified site: Secondary | ICD-10-CM | POA: Diagnosis not present

## 2021-09-28 DIAGNOSIS — M6281 Muscle weakness (generalized): Secondary | ICD-10-CM | POA: Diagnosis not present

## 2021-09-28 DIAGNOSIS — M79671 Pain in right foot: Secondary | ICD-10-CM | POA: Diagnosis not present

## 2021-10-01 DIAGNOSIS — M5459 Other low back pain: Secondary | ICD-10-CM | POA: Diagnosis not present

## 2021-10-01 DIAGNOSIS — E1122 Type 2 diabetes mellitus with diabetic chronic kidney disease: Secondary | ICD-10-CM | POA: Diagnosis not present

## 2021-10-01 DIAGNOSIS — M6281 Muscle weakness (generalized): Secondary | ICD-10-CM | POA: Diagnosis not present

## 2021-10-01 DIAGNOSIS — R2681 Unsteadiness on feet: Secondary | ICD-10-CM | POA: Diagnosis not present

## 2021-10-01 DIAGNOSIS — M1380 Other specified arthritis, unspecified site: Secondary | ICD-10-CM | POA: Diagnosis not present

## 2021-10-01 DIAGNOSIS — M79671 Pain in right foot: Secondary | ICD-10-CM | POA: Diagnosis not present

## 2021-10-01 DIAGNOSIS — R2689 Other abnormalities of gait and mobility: Secondary | ICD-10-CM | POA: Diagnosis not present

## 2021-10-03 DIAGNOSIS — M1380 Other specified arthritis, unspecified site: Secondary | ICD-10-CM | POA: Diagnosis not present

## 2021-10-03 DIAGNOSIS — E1122 Type 2 diabetes mellitus with diabetic chronic kidney disease: Secondary | ICD-10-CM | POA: Diagnosis not present

## 2021-10-03 DIAGNOSIS — R2681 Unsteadiness on feet: Secondary | ICD-10-CM | POA: Diagnosis not present

## 2021-10-03 DIAGNOSIS — M79671 Pain in right foot: Secondary | ICD-10-CM | POA: Diagnosis not present

## 2021-10-03 DIAGNOSIS — M5459 Other low back pain: Secondary | ICD-10-CM | POA: Diagnosis not present

## 2021-10-03 DIAGNOSIS — M6281 Muscle weakness (generalized): Secondary | ICD-10-CM | POA: Diagnosis not present

## 2021-10-03 DIAGNOSIS — R2689 Other abnormalities of gait and mobility: Secondary | ICD-10-CM | POA: Diagnosis not present

## 2021-10-08 DIAGNOSIS — M6281 Muscle weakness (generalized): Secondary | ICD-10-CM | POA: Diagnosis not present

## 2021-10-08 DIAGNOSIS — K219 Gastro-esophageal reflux disease without esophagitis: Secondary | ICD-10-CM | POA: Diagnosis not present

## 2021-10-08 DIAGNOSIS — R2689 Other abnormalities of gait and mobility: Secondary | ICD-10-CM | POA: Diagnosis not present

## 2021-10-08 DIAGNOSIS — R490 Dysphonia: Secondary | ICD-10-CM | POA: Diagnosis not present

## 2021-10-08 DIAGNOSIS — R2681 Unsteadiness on feet: Secondary | ICD-10-CM | POA: Diagnosis not present

## 2021-10-08 DIAGNOSIS — E1122 Type 2 diabetes mellitus with diabetic chronic kidney disease: Secondary | ICD-10-CM | POA: Diagnosis not present

## 2021-10-08 DIAGNOSIS — M1380 Other specified arthritis, unspecified site: Secondary | ICD-10-CM | POA: Diagnosis not present

## 2021-10-08 DIAGNOSIS — M5459 Other low back pain: Secondary | ICD-10-CM | POA: Diagnosis not present

## 2021-10-08 DIAGNOSIS — M79671 Pain in right foot: Secondary | ICD-10-CM | POA: Diagnosis not present

## 2021-10-10 DIAGNOSIS — M79671 Pain in right foot: Secondary | ICD-10-CM | POA: Diagnosis not present

## 2021-10-10 DIAGNOSIS — M5459 Other low back pain: Secondary | ICD-10-CM | POA: Diagnosis not present

## 2021-10-10 DIAGNOSIS — M6281 Muscle weakness (generalized): Secondary | ICD-10-CM | POA: Diagnosis not present

## 2021-10-10 DIAGNOSIS — R2681 Unsteadiness on feet: Secondary | ICD-10-CM | POA: Diagnosis not present

## 2021-10-10 DIAGNOSIS — R2689 Other abnormalities of gait and mobility: Secondary | ICD-10-CM | POA: Diagnosis not present

## 2021-10-10 DIAGNOSIS — E1122 Type 2 diabetes mellitus with diabetic chronic kidney disease: Secondary | ICD-10-CM | POA: Diagnosis not present

## 2021-10-10 DIAGNOSIS — M1380 Other specified arthritis, unspecified site: Secondary | ICD-10-CM | POA: Diagnosis not present

## 2021-10-12 DIAGNOSIS — R2681 Unsteadiness on feet: Secondary | ICD-10-CM | POA: Diagnosis not present

## 2021-10-12 DIAGNOSIS — M6281 Muscle weakness (generalized): Secondary | ICD-10-CM | POA: Diagnosis not present

## 2021-10-12 DIAGNOSIS — R2689 Other abnormalities of gait and mobility: Secondary | ICD-10-CM | POA: Diagnosis not present

## 2021-10-12 DIAGNOSIS — M79671 Pain in right foot: Secondary | ICD-10-CM | POA: Diagnosis not present

## 2021-10-12 DIAGNOSIS — M1380 Other specified arthritis, unspecified site: Secondary | ICD-10-CM | POA: Diagnosis not present

## 2021-10-12 DIAGNOSIS — E1122 Type 2 diabetes mellitus with diabetic chronic kidney disease: Secondary | ICD-10-CM | POA: Diagnosis not present

## 2021-10-12 DIAGNOSIS — M5459 Other low back pain: Secondary | ICD-10-CM | POA: Diagnosis not present

## 2021-10-15 DIAGNOSIS — E1122 Type 2 diabetes mellitus with diabetic chronic kidney disease: Secondary | ICD-10-CM | POA: Diagnosis not present

## 2021-10-15 DIAGNOSIS — R2681 Unsteadiness on feet: Secondary | ICD-10-CM | POA: Diagnosis not present

## 2021-10-15 DIAGNOSIS — M6281 Muscle weakness (generalized): Secondary | ICD-10-CM | POA: Diagnosis not present

## 2021-10-15 DIAGNOSIS — M5459 Other low back pain: Secondary | ICD-10-CM | POA: Diagnosis not present

## 2021-10-15 DIAGNOSIS — M79671 Pain in right foot: Secondary | ICD-10-CM | POA: Diagnosis not present

## 2021-10-15 DIAGNOSIS — M1380 Other specified arthritis, unspecified site: Secondary | ICD-10-CM | POA: Diagnosis not present

## 2021-10-15 DIAGNOSIS — R2689 Other abnormalities of gait and mobility: Secondary | ICD-10-CM | POA: Diagnosis not present

## 2021-10-18 DIAGNOSIS — M5459 Other low back pain: Secondary | ICD-10-CM | POA: Diagnosis not present

## 2021-10-18 DIAGNOSIS — M79671 Pain in right foot: Secondary | ICD-10-CM | POA: Diagnosis not present

## 2021-10-18 DIAGNOSIS — M6281 Muscle weakness (generalized): Secondary | ICD-10-CM | POA: Diagnosis not present

## 2021-10-18 DIAGNOSIS — R2681 Unsteadiness on feet: Secondary | ICD-10-CM | POA: Diagnosis not present

## 2021-10-18 DIAGNOSIS — M1380 Other specified arthritis, unspecified site: Secondary | ICD-10-CM | POA: Diagnosis not present

## 2021-10-18 DIAGNOSIS — E1122 Type 2 diabetes mellitus with diabetic chronic kidney disease: Secondary | ICD-10-CM | POA: Diagnosis not present

## 2021-10-18 DIAGNOSIS — R2689 Other abnormalities of gait and mobility: Secondary | ICD-10-CM | POA: Diagnosis not present

## 2021-10-24 DIAGNOSIS — R2689 Other abnormalities of gait and mobility: Secondary | ICD-10-CM | POA: Diagnosis not present

## 2021-10-24 DIAGNOSIS — R2681 Unsteadiness on feet: Secondary | ICD-10-CM | POA: Diagnosis not present

## 2021-10-24 DIAGNOSIS — M6281 Muscle weakness (generalized): Secondary | ICD-10-CM | POA: Diagnosis not present

## 2021-10-24 DIAGNOSIS — M79671 Pain in right foot: Secondary | ICD-10-CM | POA: Diagnosis not present

## 2021-10-24 DIAGNOSIS — M5459 Other low back pain: Secondary | ICD-10-CM | POA: Diagnosis not present

## 2021-10-24 DIAGNOSIS — M1380 Other specified arthritis, unspecified site: Secondary | ICD-10-CM | POA: Diagnosis not present

## 2021-10-24 DIAGNOSIS — E1122 Type 2 diabetes mellitus with diabetic chronic kidney disease: Secondary | ICD-10-CM | POA: Diagnosis not present

## 2021-10-31 DIAGNOSIS — M6528 Calcific tendinitis, other site: Secondary | ICD-10-CM | POA: Diagnosis not present

## 2021-10-31 DIAGNOSIS — R262 Difficulty in walking, not elsewhere classified: Secondary | ICD-10-CM | POA: Diagnosis not present

## 2021-11-02 DIAGNOSIS — I129 Hypertensive chronic kidney disease with stage 1 through stage 4 chronic kidney disease, or unspecified chronic kidney disease: Secondary | ICD-10-CM | POA: Diagnosis not present

## 2021-11-02 DIAGNOSIS — E1122 Type 2 diabetes mellitus with diabetic chronic kidney disease: Secondary | ICD-10-CM | POA: Diagnosis not present

## 2021-11-02 DIAGNOSIS — J440 Chronic obstructive pulmonary disease with acute lower respiratory infection: Secondary | ICD-10-CM | POA: Diagnosis not present

## 2021-11-02 DIAGNOSIS — E782 Mixed hyperlipidemia: Secondary | ICD-10-CM | POA: Diagnosis not present

## 2021-11-02 DIAGNOSIS — K219 Gastro-esophageal reflux disease without esophagitis: Secondary | ICD-10-CM | POA: Diagnosis not present

## 2021-11-02 DIAGNOSIS — N1832 Chronic kidney disease, stage 3b: Secondary | ICD-10-CM | POA: Diagnosis not present

## 2021-11-02 DIAGNOSIS — M810 Age-related osteoporosis without current pathological fracture: Secondary | ICD-10-CM | POA: Diagnosis not present

## 2021-11-02 DIAGNOSIS — I5032 Chronic diastolic (congestive) heart failure: Secondary | ICD-10-CM | POA: Diagnosis not present

## 2021-11-09 DIAGNOSIS — M79671 Pain in right foot: Secondary | ICD-10-CM | POA: Diagnosis not present

## 2021-11-09 DIAGNOSIS — E119 Type 2 diabetes mellitus without complications: Secondary | ICD-10-CM | POA: Diagnosis not present

## 2021-11-09 DIAGNOSIS — L84 Corns and callosities: Secondary | ICD-10-CM | POA: Diagnosis not present

## 2021-11-09 DIAGNOSIS — B351 Tinea unguium: Secondary | ICD-10-CM | POA: Diagnosis not present

## 2021-11-09 DIAGNOSIS — M79672 Pain in left foot: Secondary | ICD-10-CM | POA: Diagnosis not present

## 2021-11-16 DIAGNOSIS — M810 Age-related osteoporosis without current pathological fracture: Secondary | ICD-10-CM | POA: Diagnosis not present

## 2021-11-16 DIAGNOSIS — E1122 Type 2 diabetes mellitus with diabetic chronic kidney disease: Secondary | ICD-10-CM | POA: Diagnosis not present

## 2021-11-16 DIAGNOSIS — Z79899 Other long term (current) drug therapy: Secondary | ICD-10-CM | POA: Diagnosis not present

## 2021-11-25 ENCOUNTER — Other Ambulatory Visit (HOSPITAL_COMMUNITY): Payer: Self-pay | Admitting: Internal Medicine

## 2021-11-25 DIAGNOSIS — I5022 Chronic systolic (congestive) heart failure: Secondary | ICD-10-CM

## 2021-11-27 DIAGNOSIS — R2689 Other abnormalities of gait and mobility: Secondary | ICD-10-CM | POA: Diagnosis not present

## 2021-11-27 DIAGNOSIS — R2681 Unsteadiness on feet: Secondary | ICD-10-CM | POA: Diagnosis not present

## 2021-11-27 DIAGNOSIS — M1380 Other specified arthritis, unspecified site: Secondary | ICD-10-CM | POA: Diagnosis not present

## 2021-11-27 DIAGNOSIS — E1122 Type 2 diabetes mellitus with diabetic chronic kidney disease: Secondary | ICD-10-CM | POA: Diagnosis not present

## 2021-11-27 DIAGNOSIS — M79671 Pain in right foot: Secondary | ICD-10-CM | POA: Diagnosis not present

## 2021-11-27 DIAGNOSIS — M6281 Muscle weakness (generalized): Secondary | ICD-10-CM | POA: Diagnosis not present

## 2021-11-27 DIAGNOSIS — M5459 Other low back pain: Secondary | ICD-10-CM | POA: Diagnosis not present

## 2021-12-03 DIAGNOSIS — Z79899 Other long term (current) drug therapy: Secondary | ICD-10-CM | POA: Diagnosis not present

## 2021-12-05 DIAGNOSIS — N184 Chronic kidney disease, stage 4 (severe): Secondary | ICD-10-CM | POA: Diagnosis not present

## 2021-12-10 DIAGNOSIS — N1832 Chronic kidney disease, stage 3b: Secondary | ICD-10-CM | POA: Diagnosis not present

## 2021-12-10 DIAGNOSIS — M7989 Other specified soft tissue disorders: Secondary | ICD-10-CM | POA: Diagnosis not present

## 2021-12-10 DIAGNOSIS — K219 Gastro-esophageal reflux disease without esophagitis: Secondary | ICD-10-CM | POA: Diagnosis not present

## 2021-12-17 DIAGNOSIS — I130 Hypertensive heart and chronic kidney disease with heart failure and stage 1 through stage 4 chronic kidney disease, or unspecified chronic kidney disease: Secondary | ICD-10-CM | POA: Diagnosis not present

## 2021-12-17 DIAGNOSIS — N1832 Chronic kidney disease, stage 3b: Secondary | ICD-10-CM | POA: Diagnosis not present

## 2021-12-17 DIAGNOSIS — Z7985 Long-term (current) use of injectable non-insulin antidiabetic drugs: Secondary | ICD-10-CM | POA: Diagnosis not present

## 2021-12-17 DIAGNOSIS — E1122 Type 2 diabetes mellitus with diabetic chronic kidney disease: Secondary | ICD-10-CM | POA: Diagnosis not present

## 2021-12-17 DIAGNOSIS — Z7984 Long term (current) use of oral hypoglycemic drugs: Secondary | ICD-10-CM | POA: Diagnosis not present

## 2021-12-23 ENCOUNTER — Other Ambulatory Visit (HOSPITAL_COMMUNITY): Payer: Self-pay | Admitting: Internal Medicine

## 2021-12-23 DIAGNOSIS — I5022 Chronic systolic (congestive) heart failure: Secondary | ICD-10-CM

## 2021-12-31 DIAGNOSIS — N1832 Chronic kidney disease, stage 3b: Secondary | ICD-10-CM | POA: Diagnosis not present

## 2022-01-14 ENCOUNTER — Other Ambulatory Visit (HOSPITAL_COMMUNITY): Payer: Self-pay | Admitting: Internal Medicine

## 2022-01-14 DIAGNOSIS — I5022 Chronic systolic (congestive) heart failure: Secondary | ICD-10-CM

## 2022-01-25 DIAGNOSIS — H52203 Unspecified astigmatism, bilateral: Secondary | ICD-10-CM | POA: Diagnosis not present

## 2022-01-25 DIAGNOSIS — Z961 Presence of intraocular lens: Secondary | ICD-10-CM | POA: Diagnosis not present

## 2022-01-25 DIAGNOSIS — E119 Type 2 diabetes mellitus without complications: Secondary | ICD-10-CM | POA: Diagnosis not present

## 2022-02-15 DIAGNOSIS — E1142 Type 2 diabetes mellitus with diabetic polyneuropathy: Secondary | ICD-10-CM | POA: Diagnosis not present

## 2022-02-15 DIAGNOSIS — I700 Atherosclerosis of aorta: Secondary | ICD-10-CM | POA: Diagnosis not present

## 2022-02-15 DIAGNOSIS — E782 Mixed hyperlipidemia: Secondary | ICD-10-CM | POA: Diagnosis not present

## 2022-02-15 DIAGNOSIS — Z0000 Encounter for general adult medical examination without abnormal findings: Secondary | ICD-10-CM | POA: Diagnosis not present

## 2022-02-15 DIAGNOSIS — E1122 Type 2 diabetes mellitus with diabetic chronic kidney disease: Secondary | ICD-10-CM | POA: Diagnosis not present

## 2022-02-15 DIAGNOSIS — N184 Chronic kidney disease, stage 4 (severe): Secondary | ICD-10-CM | POA: Diagnosis not present

## 2022-02-15 DIAGNOSIS — Z79899 Other long term (current) drug therapy: Secondary | ICD-10-CM | POA: Diagnosis not present

## 2022-02-15 DIAGNOSIS — J440 Chronic obstructive pulmonary disease with acute lower respiratory infection: Secondary | ICD-10-CM | POA: Diagnosis not present

## 2022-02-15 DIAGNOSIS — D539 Nutritional anemia, unspecified: Secondary | ICD-10-CM | POA: Diagnosis not present

## 2022-02-15 DIAGNOSIS — I5032 Chronic diastolic (congestive) heart failure: Secondary | ICD-10-CM | POA: Diagnosis not present

## 2022-02-15 DIAGNOSIS — I129 Hypertensive chronic kidney disease with stage 1 through stage 4 chronic kidney disease, or unspecified chronic kidney disease: Secondary | ICD-10-CM | POA: Diagnosis not present

## 2022-02-15 DIAGNOSIS — Z1331 Encounter for screening for depression: Secondary | ICD-10-CM | POA: Diagnosis not present

## 2022-02-15 DIAGNOSIS — R269 Unspecified abnormalities of gait and mobility: Secondary | ICD-10-CM | POA: Diagnosis not present

## 2022-02-22 DIAGNOSIS — B351 Tinea unguium: Secondary | ICD-10-CM | POA: Diagnosis not present

## 2022-02-22 DIAGNOSIS — M79672 Pain in left foot: Secondary | ICD-10-CM | POA: Diagnosis not present

## 2022-02-22 DIAGNOSIS — M79671 Pain in right foot: Secondary | ICD-10-CM | POA: Diagnosis not present

## 2022-02-22 DIAGNOSIS — E119 Type 2 diabetes mellitus without complications: Secondary | ICD-10-CM | POA: Diagnosis not present

## 2022-02-22 DIAGNOSIS — L84 Corns and callosities: Secondary | ICD-10-CM | POA: Diagnosis not present

## 2022-02-27 DIAGNOSIS — M6281 Muscle weakness (generalized): Secondary | ICD-10-CM | POA: Diagnosis not present

## 2022-02-27 DIAGNOSIS — R2681 Unsteadiness on feet: Secondary | ICD-10-CM | POA: Diagnosis not present

## 2022-02-27 DIAGNOSIS — R2689 Other abnormalities of gait and mobility: Secondary | ICD-10-CM | POA: Diagnosis not present

## 2022-02-27 DIAGNOSIS — R269 Unspecified abnormalities of gait and mobility: Secondary | ICD-10-CM | POA: Diagnosis not present

## 2022-03-29 DIAGNOSIS — H90A21 Sensorineural hearing loss, unilateral, right ear, with restricted hearing on the contralateral side: Secondary | ICD-10-CM | POA: Diagnosis not present

## 2022-03-29 DIAGNOSIS — H90A32 Mixed conductive and sensorineural hearing loss, unilateral, left ear with restricted hearing on the contralateral side: Secondary | ICD-10-CM | POA: Diagnosis not present

## 2022-04-03 DIAGNOSIS — M6281 Muscle weakness (generalized): Secondary | ICD-10-CM | POA: Diagnosis not present

## 2022-04-03 DIAGNOSIS — R269 Unspecified abnormalities of gait and mobility: Secondary | ICD-10-CM | POA: Diagnosis not present

## 2022-04-03 DIAGNOSIS — R2689 Other abnormalities of gait and mobility: Secondary | ICD-10-CM | POA: Diagnosis not present

## 2022-04-03 DIAGNOSIS — R2681 Unsteadiness on feet: Secondary | ICD-10-CM | POA: Diagnosis not present

## 2022-04-08 DIAGNOSIS — H9042 Sensorineural hearing loss, unilateral, left ear, with unrestricted hearing on the contralateral side: Secondary | ICD-10-CM | POA: Diagnosis not present

## 2022-04-08 DIAGNOSIS — H9312 Tinnitus, left ear: Secondary | ICD-10-CM | POA: Diagnosis not present

## 2022-04-08 DIAGNOSIS — K219 Gastro-esophageal reflux disease without esophagitis: Secondary | ICD-10-CM | POA: Diagnosis not present

## 2022-04-08 DIAGNOSIS — R490 Dysphonia: Secondary | ICD-10-CM | POA: Diagnosis not present

## 2022-04-19 DIAGNOSIS — I129 Hypertensive chronic kidney disease with stage 1 through stage 4 chronic kidney disease, or unspecified chronic kidney disease: Secondary | ICD-10-CM | POA: Diagnosis not present

## 2022-04-19 DIAGNOSIS — E1122 Type 2 diabetes mellitus with diabetic chronic kidney disease: Secondary | ICD-10-CM | POA: Diagnosis not present

## 2022-04-22 DIAGNOSIS — E1122 Type 2 diabetes mellitus with diabetic chronic kidney disease: Secondary | ICD-10-CM | POA: Diagnosis not present

## 2022-04-22 DIAGNOSIS — E782 Mixed hyperlipidemia: Secondary | ICD-10-CM | POA: Diagnosis not present

## 2022-04-22 DIAGNOSIS — K219 Gastro-esophageal reflux disease without esophagitis: Secondary | ICD-10-CM | POA: Diagnosis not present

## 2022-04-22 DIAGNOSIS — I129 Hypertensive chronic kidney disease with stage 1 through stage 4 chronic kidney disease, or unspecified chronic kidney disease: Secondary | ICD-10-CM | POA: Diagnosis not present

## 2022-04-22 DIAGNOSIS — J440 Chronic obstructive pulmonary disease with acute lower respiratory infection: Secondary | ICD-10-CM | POA: Diagnosis not present

## 2022-04-22 DIAGNOSIS — N184 Chronic kidney disease, stage 4 (severe): Secondary | ICD-10-CM | POA: Diagnosis not present

## 2022-04-22 DIAGNOSIS — D539 Nutritional anemia, unspecified: Secondary | ICD-10-CM | POA: Diagnosis not present

## 2022-04-22 DIAGNOSIS — M810 Age-related osteoporosis without current pathological fracture: Secondary | ICD-10-CM | POA: Diagnosis not present

## 2022-04-25 DIAGNOSIS — M6281 Muscle weakness (generalized): Secondary | ICD-10-CM | POA: Diagnosis not present

## 2022-04-25 DIAGNOSIS — R2689 Other abnormalities of gait and mobility: Secondary | ICD-10-CM | POA: Diagnosis not present

## 2022-04-25 DIAGNOSIS — R2681 Unsteadiness on feet: Secondary | ICD-10-CM | POA: Diagnosis not present

## 2022-04-25 DIAGNOSIS — R269 Unspecified abnormalities of gait and mobility: Secondary | ICD-10-CM | POA: Diagnosis not present

## 2022-05-05 ENCOUNTER — Other Ambulatory Visit (HOSPITAL_COMMUNITY): Payer: Self-pay | Admitting: Internal Medicine

## 2022-05-05 DIAGNOSIS — I5022 Chronic systolic (congestive) heart failure: Secondary | ICD-10-CM

## 2022-05-27 ENCOUNTER — Ambulatory Visit
Admission: RE | Admit: 2022-05-27 | Discharge: 2022-05-27 | Disposition: A | Discharge status: Home / Self Care | Payer: PPO | Source: Ambulatory Visit | Attending: Physician Assistant | Admitting: Physician Assistant

## 2022-05-27 ENCOUNTER — Other Ambulatory Visit: Payer: Self-pay | Admitting: Physician Assistant

## 2022-05-27 DIAGNOSIS — R051 Acute cough: Secondary | ICD-10-CM

## 2022-05-27 DIAGNOSIS — R0602 Shortness of breath: Secondary | ICD-10-CM | POA: Diagnosis not present

## 2022-05-27 DIAGNOSIS — J441 Chronic obstructive pulmonary disease with (acute) exacerbation: Secondary | ICD-10-CM | POA: Diagnosis not present

## 2022-05-27 DIAGNOSIS — Z03818 Encounter for observation for suspected exposure to other biological agents ruled out: Secondary | ICD-10-CM | POA: Diagnosis not present

## 2022-05-27 DIAGNOSIS — R059 Cough, unspecified: Secondary | ICD-10-CM | POA: Diagnosis not present

## 2022-06-02 ENCOUNTER — Other Ambulatory Visit (HOSPITAL_COMMUNITY): Payer: Self-pay | Admitting: Internal Medicine

## 2022-06-02 DIAGNOSIS — I5022 Chronic systolic (congestive) heart failure: Secondary | ICD-10-CM

## 2022-06-07 DIAGNOSIS — E1122 Type 2 diabetes mellitus with diabetic chronic kidney disease: Secondary | ICD-10-CM | POA: Diagnosis not present

## 2022-06-07 DIAGNOSIS — R059 Cough, unspecified: Secondary | ICD-10-CM | POA: Diagnosis not present

## 2022-06-07 DIAGNOSIS — I129 Hypertensive chronic kidney disease with stage 1 through stage 4 chronic kidney disease, or unspecified chronic kidney disease: Secondary | ICD-10-CM | POA: Diagnosis not present

## 2022-06-07 DIAGNOSIS — M810 Age-related osteoporosis without current pathological fracture: Secondary | ICD-10-CM | POA: Diagnosis not present

## 2022-06-19 DIAGNOSIS — J440 Chronic obstructive pulmonary disease with acute lower respiratory infection: Secondary | ICD-10-CM | POA: Diagnosis not present

## 2022-06-19 DIAGNOSIS — I129 Hypertensive chronic kidney disease with stage 1 through stage 4 chronic kidney disease, or unspecified chronic kidney disease: Secondary | ICD-10-CM | POA: Diagnosis not present

## 2022-06-19 DIAGNOSIS — K219 Gastro-esophageal reflux disease without esophagitis: Secondary | ICD-10-CM | POA: Diagnosis not present

## 2022-06-19 DIAGNOSIS — E1122 Type 2 diabetes mellitus with diabetic chronic kidney disease: Secondary | ICD-10-CM | POA: Diagnosis not present

## 2022-06-19 DIAGNOSIS — D539 Nutritional anemia, unspecified: Secondary | ICD-10-CM | POA: Diagnosis not present

## 2022-06-19 DIAGNOSIS — N184 Chronic kidney disease, stage 4 (severe): Secondary | ICD-10-CM | POA: Diagnosis not present

## 2022-06-19 DIAGNOSIS — E782 Mixed hyperlipidemia: Secondary | ICD-10-CM | POA: Diagnosis not present

## 2022-06-19 DIAGNOSIS — M810 Age-related osteoporosis without current pathological fracture: Secondary | ICD-10-CM | POA: Diagnosis not present

## 2022-07-15 ENCOUNTER — Other Ambulatory Visit: Payer: Self-pay | Admitting: Internal Medicine

## 2022-07-15 DIAGNOSIS — Z1231 Encounter for screening mammogram for malignant neoplasm of breast: Secondary | ICD-10-CM

## 2022-07-17 DIAGNOSIS — R2689 Other abnormalities of gait and mobility: Secondary | ICD-10-CM | POA: Diagnosis not present

## 2022-07-17 DIAGNOSIS — R2681 Unsteadiness on feet: Secondary | ICD-10-CM | POA: Diagnosis not present

## 2022-07-17 DIAGNOSIS — M6281 Muscle weakness (generalized): Secondary | ICD-10-CM | POA: Diagnosis not present

## 2022-07-17 DIAGNOSIS — R278 Other lack of coordination: Secondary | ICD-10-CM | POA: Diagnosis not present

## 2022-08-01 ENCOUNTER — Other Ambulatory Visit: Payer: Self-pay

## 2022-08-01 ENCOUNTER — Encounter: Payer: Self-pay | Admitting: Adult Health

## 2022-08-01 ENCOUNTER — Inpatient Hospital Stay: Payer: PPO | Attending: Adult Health | Admitting: Adult Health

## 2022-08-01 VITALS — BP 145/67 | HR 59 | Temp 97.0°F | Resp 17 | Wt 155.2 lb

## 2022-08-01 DIAGNOSIS — C50411 Malignant neoplasm of upper-outer quadrant of right female breast: Secondary | ICD-10-CM | POA: Diagnosis not present

## 2022-08-01 DIAGNOSIS — Z853 Personal history of malignant neoplasm of breast: Secondary | ICD-10-CM | POA: Insufficient documentation

## 2022-08-01 DIAGNOSIS — Z170 Estrogen receptor positive status [ER+]: Secondary | ICD-10-CM | POA: Diagnosis not present

## 2022-08-01 DIAGNOSIS — Z9221 Personal history of antineoplastic chemotherapy: Secondary | ICD-10-CM | POA: Diagnosis not present

## 2022-08-01 NOTE — Assessment & Plan Note (Addendum)
Rebecca Hall is a 89 year old woman with history of stage Ia ER positive, PR positive and HER2 positive breast cancer diagnosed in September 2014 status postlumpectomy followed by maintenance trastuzumab and adjuvant anastrozole that she finished in November 2019.  She continues on observation alone and has no clinical or radiographic signs of breast cancer recurrence.  Proceed with the mammogram that is scheduled in March of this year.  She continues to do well and her health maintenance is up-to-date.  I recommended that she continue to follow-up with her primary care provider and she would refer to see [place] back annually which we are happy to accommodate.  She will return in 1 year for continued long-term surveillance.

## 2022-08-01 NOTE — Progress Notes (Signed)
[name]:    [name], MD [address] [name] [address]   DIAGNOSIS:  Cancer Staging  Malignant neoplasm of upper-outer quadrant of right breast in female, estrogen receptor positive ([place]) Staging form: Breast, AJCC 7th Edition - Clinical: Stage IA (T1c, N0, M0) - Signed by Chauncey Cruel, MD on 06/13/2015   SUMMARY OF ONCOLOGIC HISTORY: Per Dr. Jana Hakim most recent note   89 y.o. [place] woman, status post right breast upper-outer quadrant biopsy 03/15/2013 for a clinical T1c N0, stage IA invasive ductal carcinoma, grade 1 or 2, estrogen receptor 100% positive, progesterone receptor 100% positive, with an MIB-1 of 30%, and HER-2 amplified by CISH, with a ratio of 3.51 and an average HER-2 copy number Damita Lack of 6.85   (1) biopsy of 2 suspicious areas in the left breast both showed no evidence of malignancy   (2) right lumpectomy and sentinel lymph node sampling 04/28/2013 showed a pT1b pN0, stage IA invasive ductal carcinoma, grade 2, with negative margins             (a) did not need radiation therapy   (3) trastuzumab started 05/31/2013, given every 3 weeks for total of 9 doses; held as of 07/16/2013 after 3 doses because of possible allergic dermatitis associated with the drug.    (4) anastrozole started 05/08/2013, and 5 years in November 2019             (a) dexa scan 06/10/2013 showed osteopenia with a T score of -2.1             (b) zolendronate started 09/13/2013, repeated 04/17/2015             (c) switched to denosumab/Prolia as of April 2017   (5) congestive heart failure in the setting of sepsis August 2016, with normal repeat echo December 2016  CURRENT THERAPY: Observation  INTERVAL HISTORY: Rebecca Hall 89 y.o. female returns for follow-up of her right breast cancer.  She is doing well today.  Her next mammogram is scheduled for March 11th 2024.  Carol tells me that she is doing well today.  She  has no breast concerns.  She continues to see her primary care provider regularly and is up-to-date with her health maintenance.    Patient Active Problem List   Diagnosis Date Noted   History of breast cancer 05/20/2019   Chronic diastolic CHF (congestive heart failure) ([place]) 01/01/2016   CKD (chronic kidney disease) stage 4, GFR 15-29 ml/min ([place]) 12/20/2015   AKI (acute kidney injury) ([place]) 06/30/2015   CKD (chronic kidney disease) stage 3, GFR 30-59 ml/min ([place]) 03/09/2015   CAD in native artery 03/09/2015   Hypotension (arterial) 08/65/7846   Chronic systolic heart failure ([place]) 96/29/5284   Acute systolic CHF (congestive heart failure) ([place]) 01/27/2015   Acute renal failure superimposed on stage 3 chronic kidney disease ([place]) 01/26/2015   DM (diabetes mellitus), type 2 with renal complications ([place]) 13/24/4010   Shock ([place])    Nausea vomiting and diarrhea 01/22/2015   Hypomagnesemia 01/22/2015   Renal failure (ARF), acute on chronic: Stage 2 01/22/2015   Encounter for central line placement    Respiratory failure (HCC)    Acidosis, metabolic    Hyperkalemia    Acute renal failure syndrome ([place])    Hypovolemic shock ([place]) 01/19/2015   Osteoporosis 08/23/2013    Class: Acute   Dermatitis due to drug reaction 07/16/2013   Insomnia 06/21/2013  Generalized headaches 06/21/2013   Hypokalemia 06/21/2013   Osteopenia 06/21/2013   Malignant neoplasm of upper-outer quadrant of right breast in female, estrogen receptor positive ([place]) 04/23/2013    is allergic to levaquin [levofloxacin in d5w], codeine, herceptin [trastuzumab], sulfa antibiotics, tape, aldactone [spironolactone], and sulfamethoxazole-trimethoprim.  MEDICAL HISTORY: Past Medical History:  Diagnosis Date   Arthritis    Breast cancer ([place]) 03/15/2013   right, 12 o'clock   Chronic kidney disease    Diabetes mellitus without complication (HCC)    GERD (gastroesophageal reflux disease)    History of lower GI  bleeding    Hot flashes related to aromatase inhibitor therapy 06/21/2013   Hyperkalemia 12/21/2015   Hyperlipidemia    Hypertension    IBS (irritable bowel syndrome)    Neuromuscular disorder (HCC)    peripheral neuropathy feet   Osteoporosis    Personal history of chemotherapy     SURGICAL HISTORY: Past Surgical History:  Procedure Laterality Date   ABDOMINAL HYSTERECTOMY  1975   No salpingo-oophorectomy   BREAST BIOPSY Right 2014   malignant   BREAST BIOPSY Left 04/12/2013   benign x2   BREAST LUMPECTOMY Right 2014   BREAST LUMPECTOMY WITH NEEDLE LOCALIZATION AND AXILLARY SENTINEL LYMPH NODE BX Right 04/28/2013   Procedure: BREAST LUMPECTOMY WITH NEEDLE LOCALIZATION AND AXILLARY SENTINEL LYMPH NODE BX;  Surgeon: Rolm Bookbinder, MD;  Location: [facility];  Service: General;  Laterality: Right;   CARDIAC CATHETERIZATION N/A 01/26/2015   Procedure: Right/Left Heart Cath and Coronary Angiography;  Surgeon: Jolaine Artist, MD;  Location: [facility];  Service: Cardiovascular;  Laterality: N/A;   CATARACT EXTRACTION  2009   both   COLONOSCOPY     SKIN CANCER EXCISION     head, face, hand ..multiple years   TONSILLECTOMY      SOCIAL HISTORY: Social History   Socioeconomic History   Marital status: Widowed    Spouse name: Not on file   Number of children: Not on file   Years of education: Not on file   Highest education level: Not on file  Occupational History   Not on file  Tobacco Use   Smoking status: Never   Smokeless tobacco: Never  Substance and Sexual Activity   Alcohol use: No   Drug use: No   Sexual activity: Not on file    Comment: first live birth age 77, P 2, Estrogen   Other Topics Concern   Not on file  Social History Narrative   Not on file   Social Determinants of Health   Financial Resource Strain: Not on file  Food Insecurity: Not on file  Transportation Needs: Not on file  Physical Activity: Not on file  Stress:  Not on file  Social Connections: Not on file  Intimate Partner Violence: Not on file    FAMILY HISTORY: Family History  Problem Relation Age of Onset   Cerebral aneurysm Father    Cancer Maternal Aunt 80       colon cancer   Cancer Maternal Uncle 65       lung   Cancer Maternal Grandmother        pancreas   Breast cancer Neg Hx     Review of Systems  Constitutional:  Negative for appetite change, chills, fatigue, fever and unexpected weight change.  HENT:   Negative for hearing loss, lump/mass and trouble swallowing.   Eyes:  Negative for eye problems and icterus.  Respiratory:  Negative for chest tightness,  cough and shortness of breath.   Cardiovascular:  Negative for chest pain, leg swelling and palpitations.  Gastrointestinal:  Negative for abdominal distention, abdominal pain, constipation, diarrhea, nausea and vomiting.  Endocrine: Negative for hot flashes.  Genitourinary:  Negative for difficulty urinating.   Musculoskeletal:  Negative for arthralgias.  Skin:  Negative for itching and rash.  Neurological:  Negative for dizziness, extremity weakness, headaches and numbness.  Hematological:  Negative for adenopathy. Does not bruise/bleed easily.  Psychiatric/Behavioral:  Negative for depression. The patient is not nervous/anxious.       PHYSICAL EXAMINATION  ECOG PERFORMANCE STATUS: 1 - Symptomatic but completely ambulatory  Vitals:   08/01/22 1339  BP: (!) 145/67  Pulse: (!) 59  Resp: 17  Temp: (!) 97 F (36.1 C)  SpO2: 96%    Physical Exam Constitutional:      General: She is not in acute distress.    Appearance: Normal appearance. She is not toxic-appearing.  HENT:     Head: Normocephalic and atraumatic.  Eyes:     General: No scleral icterus. Cardiovascular:     Rate and Rhythm: Normal rate and regular rhythm.     Pulses: Normal pulses.     Heart sounds: Normal heart sounds.  Pulmonary:     Effort: Pulmonary effort is normal.     Breath sounds:  Normal breath sounds.  Chest:     Comments: Right breast status postlumpectomy--no sign of local recurrence left breast is benign. Abdominal:     General: Abdomen is flat. Bowel sounds are normal. There is no distension.     Palpations: Abdomen is soft.     Tenderness: There is no abdominal tenderness.  Musculoskeletal:        General: No swelling.     Cervical back: Neck supple.  Lymphadenopathy:     Cervical: No cervical adenopathy.  Skin:    General: Skin is warm and dry.     Findings: No rash.  Neurological:     General: No focal deficit present.     Mental Status: She is alert.  Psychiatric:        Mood and Affect: Mood normal.        Behavior: Behavior normal.     LABORATORY DATA:  CBC    Component Value Date/Time   WBC 2.7 (L) 05/11/2018 1311   WBC 3.9 11/10/2017 1321   RBC 3.52 (L) 05/11/2018 1311   HGB 11.6 (L) 05/11/2018 1311   HGB 11.9 05/12/2017 1350   HCT 35.2 (L) 05/11/2018 1311   HCT 35.5 05/12/2017 1350   PLT 165 05/11/2018 1311   PLT 167 05/12/2017 1350   MCV 100.0 05/11/2018 1311   MCV 99.1 05/12/2017 1350   MCH 33.0 05/11/2018 1311   MCHC 33.0 05/11/2018 1311   RDW 13.1 05/11/2018 1311   RDW 13.4 05/12/2017 1350   LYMPHSABS 1.3 05/11/2018 1311   LYMPHSABS 1.3 05/12/2017 1350   MONOABS 0.2 05/11/2018 1311   MONOABS 0.4 05/12/2017 1350   EOSABS 0.1 05/11/2018 1311   EOSABS 0.1 05/12/2017 1350   BASOSABS 0.0 05/11/2018 1311   BASOSABS 0.0 05/12/2017 1350    CMP     Component Value Date/Time   NA 137 03/12/2021 1331   NA 136 05/12/2017 1350   K 3.5 03/12/2021 1331   K 3.9 05/12/2017 1350   CL 103 03/12/2021 1331   CO2 22 03/12/2021 1331   CO2 27 05/12/2017 1350   GLUCOSE 226 (H) 03/12/2021 1331   GLUCOSE 167 (  H) 05/12/2017 1350   BUN 23 03/12/2021 1331   BUN 29.2 (H) 05/12/2017 1350   CREATININE 1.29 (H) 03/12/2021 1331   CREATININE 1.79 (H) 05/11/2018 1311   CREATININE 1.7 (H) 05/12/2017 1350   CALCIUM 8.9 03/12/2021 1331    CALCIUM 10.2 05/12/2017 1350   PROT 6.8 05/11/2018 1311   PROT 7.3 05/12/2017 1350   ALBUMIN 3.8 05/11/2018 1311   ALBUMIN 4.0 05/12/2017 1350   AST 25 05/11/2018 1311   AST 28 05/12/2017 1350   ALT 25 05/11/2018 1311   ALT 37 05/12/2017 1350   ALKPHOS 39 05/11/2018 1311   ALKPHOS 48 05/12/2017 1350   BILITOT 0.5 05/11/2018 1311   BILITOT 0.46 05/12/2017 1350   GFRNONAA 40 (L) 03/12/2021 1331   GFRNONAA 25 (L) 05/11/2018 1311   GFRAA 43 (L) 11/29/2019 1443   GFRAA 29 (L) 05/11/2018 1311        ASSESSMENT and THERAPY PLAN:   Malignant neoplasm of upper-outer quadrant of right breast in female, estrogen receptor positive (HCC) Rebecca Hall is a 89 year old woman with history of stage Ia ER positive, PR positive and HER2 positive breast cancer diagnosed in September 2014 status postlumpectomy followed by maintenance trastuzumab and adjuvant anastrozole that she finished in November 2019.  She continues on observation alone and has no clinical or radiographic signs of breast cancer recurrence.  Proceed with the mammogram that is scheduled in March of this year.  She continues to do well and her health maintenance is up-to-date.  I recommended that she continue to follow-up with her primary care provider and she would refer to see [place] back annually which we are happy to accommodate.  She will return in 1 year for continued long-term surveillance.    All questions were answered. The patient knows to call the clinic with any problems, questions or concerns. We can certainly see the patient much sooner if necessary.  Total encounter time:20 minutes*in face-to-face visit time, chart review, lab review, care coordination, order entry, and documentation of the encounter time.    Wilber Bihari, NP 08/01/22 2:30 PM Medical Oncology and Hematology [facility] [place], [place] [address] Tel. [phone]    Fax. [phone]  *Total Encounter Time as defined  by the Centers for Medicare and Medicaid Services includes, in addition to the face-to-face time of a patient visit (documented in the note above) non-face-to-face time: obtaining and reviewing outside history, ordering and reviewing medications, tests or procedures, care coordination (communications with other health care professionals or caregivers) and documentation in the medical record.

## 2022-09-02 ENCOUNTER — Ambulatory Visit
Admission: RE | Admit: 2022-09-02 | Discharge: 2022-09-02 | Disposition: A | Discharge status: Home / Self Care | Payer: PPO | Source: Ambulatory Visit | Attending: Internal Medicine | Admitting: Internal Medicine

## 2022-09-02 DIAGNOSIS — Z1231 Encounter for screening mammogram for malignant neoplasm of breast: Secondary | ICD-10-CM

## 2022-09-09 ENCOUNTER — Other Ambulatory Visit (HOSPITAL_COMMUNITY): Payer: Self-pay | Admitting: Internal Medicine

## 2022-09-09 DIAGNOSIS — I5022 Chronic systolic (congestive) heart failure: Secondary | ICD-10-CM

## 2022-10-22 ENCOUNTER — Other Ambulatory Visit (HOSPITAL_COMMUNITY): Payer: Self-pay

## 2022-10-23 ENCOUNTER — Other Ambulatory Visit (HOSPITAL_COMMUNITY): Payer: Self-pay

## 2022-10-23 MED ORDER — TRULICITY 0.75 MG/0.5ML ~~LOC~~ SOAJ
0.7500 mg | SUBCUTANEOUS | 4 refills | Status: DC
  Filled 2022-10-23: qty 2, 28d supply, fill #0

## 2022-10-24 ENCOUNTER — Other Ambulatory Visit (HOSPITAL_COMMUNITY): Payer: Self-pay

## 2022-11-04 ENCOUNTER — Other Ambulatory Visit (HOSPITAL_COMMUNITY): Payer: Self-pay | Admitting: Internal Medicine

## 2022-11-04 DIAGNOSIS — I5022 Chronic systolic (congestive) heart failure: Secondary | ICD-10-CM

## 2022-11-18 IMAGING — MG MM DIGITAL SCREENING BILAT W/ TOMO AND CAD
8 series · 9 of 24 positions shown · non-contrast
Comparison: Previous exam(s).

CLINICAL DATA: Screening.

EXAM:
DIGITAL SCREENING BILATERAL MAMMOGRAM WITH TOMOSYNTHESIS AND CAD
TECHNIQUE: Bilateral screening digital craniocaudal and mediolateral oblique
mammograms were obtained. Bilateral screening digital breast
tomosynthesis was performed. The images were evaluated with
computer-aided detection.

[L CC synth-2D]
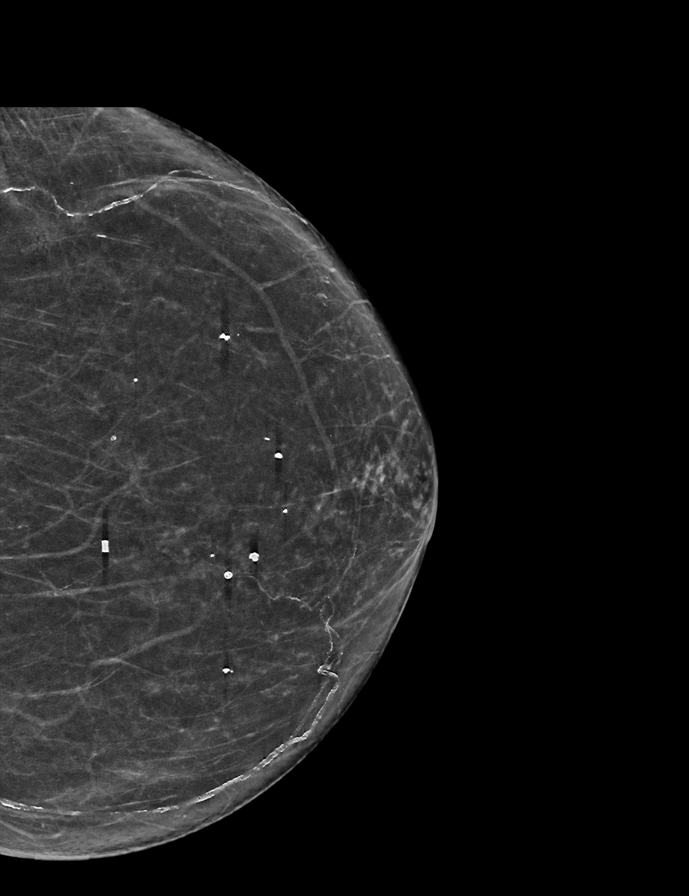

[L MLO synth-2D]
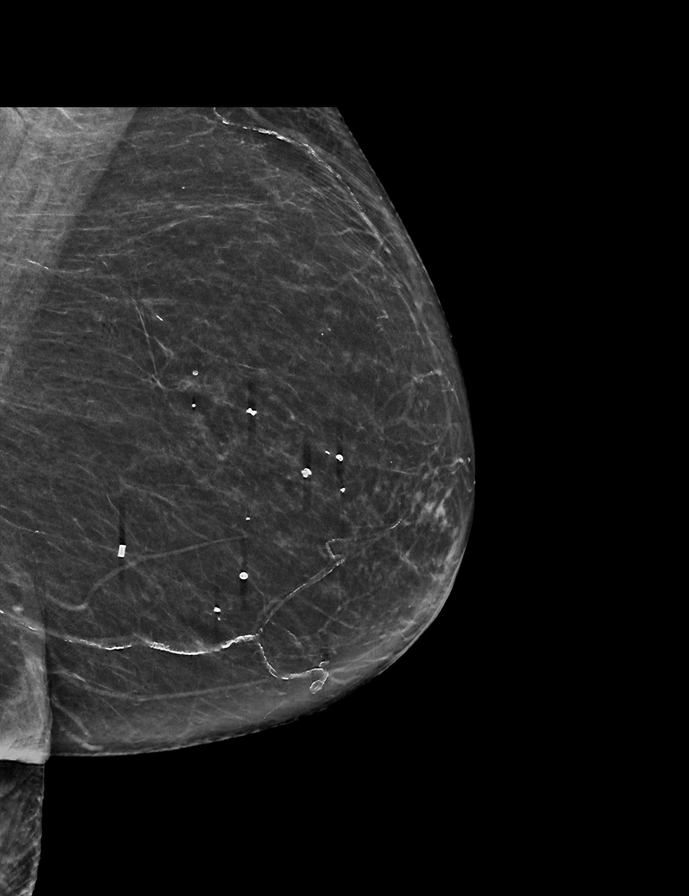

[R CC synth-2D]
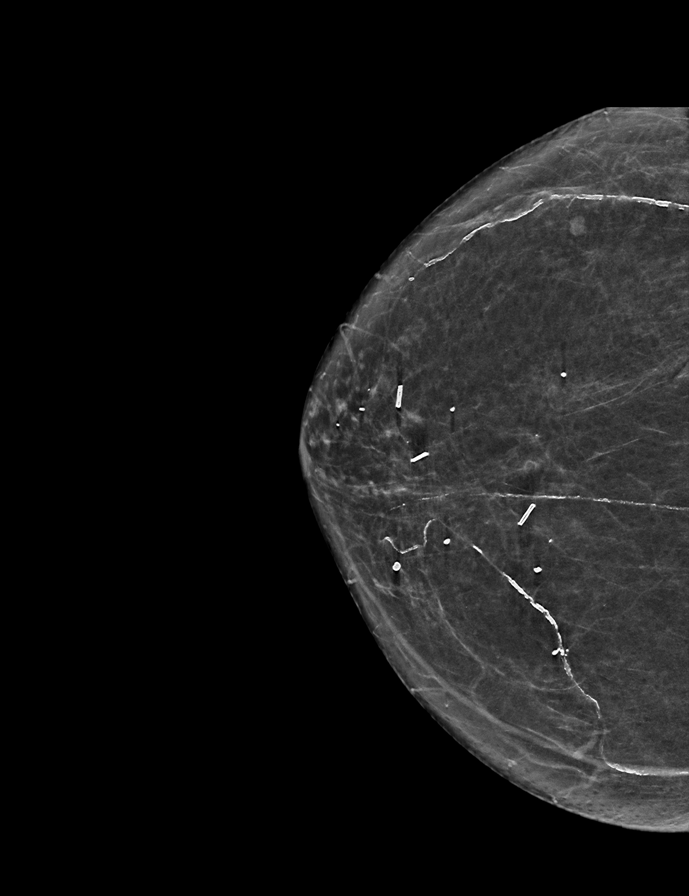

[R MLO synth-2D]
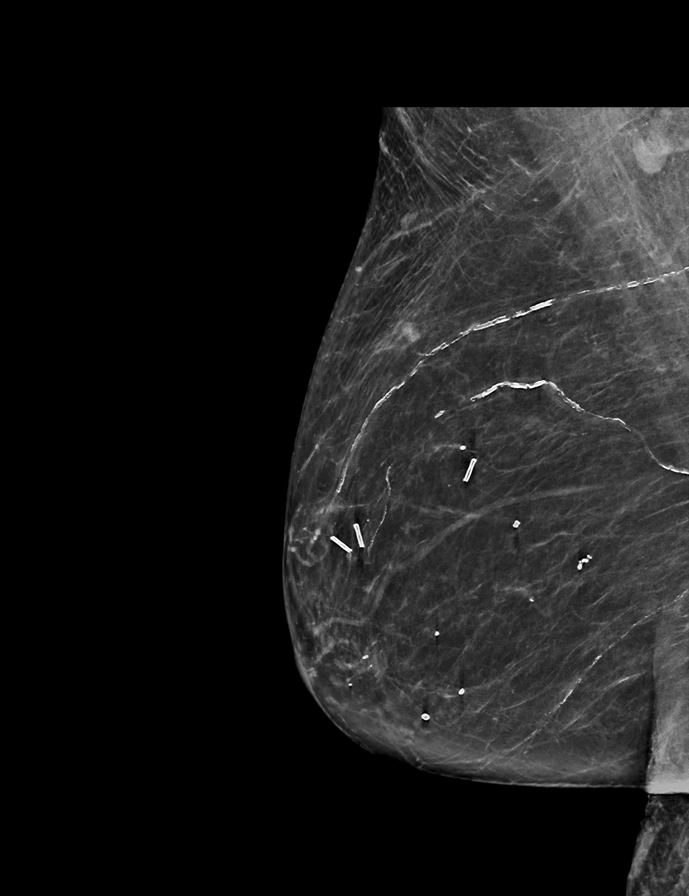

[L CC tomo · 2 of 57 frames shown]
[frame 19/57]
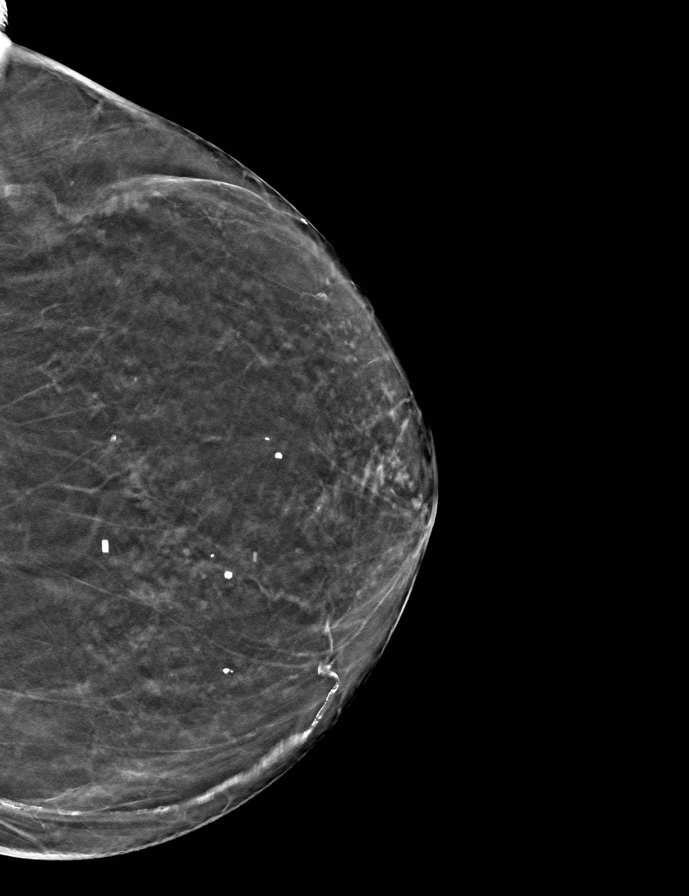
[frame 29/57]
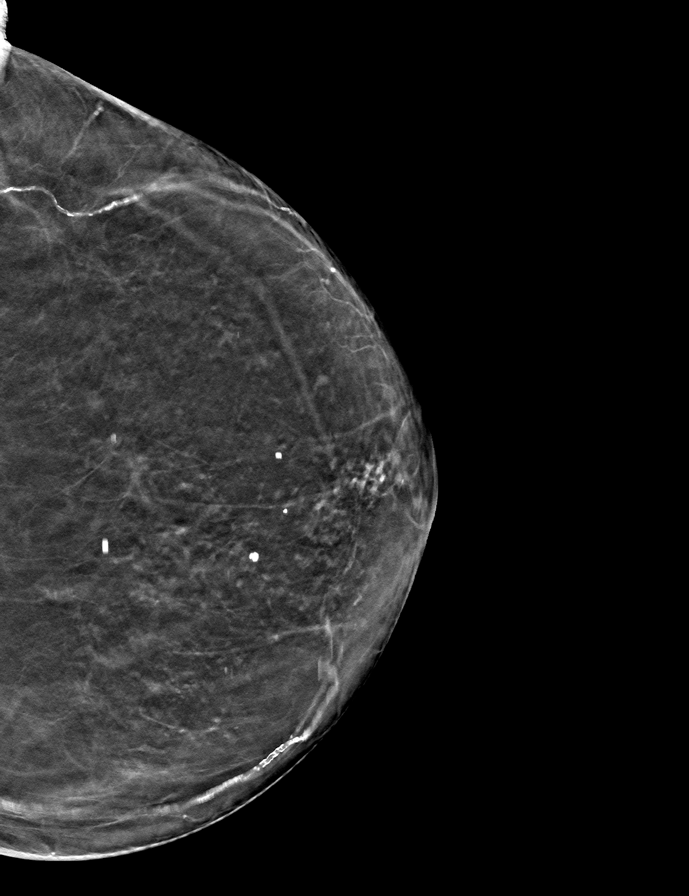

[L MLO tomo · tomo slice 31/61.0]
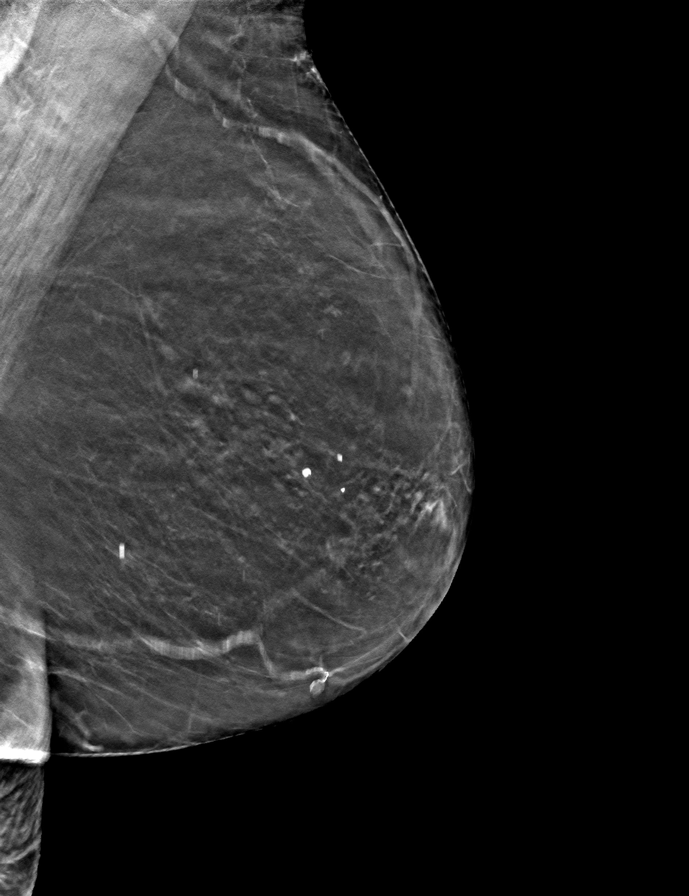

[R MLO tomo · tomo slice 33/64.0]
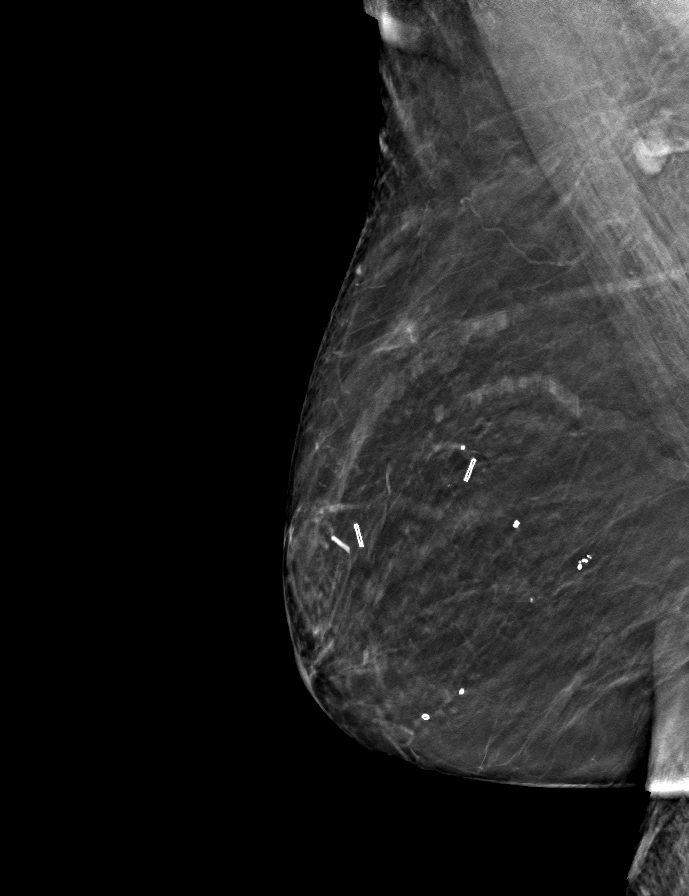

[R CC tomo · tomo slice 28/55.0]
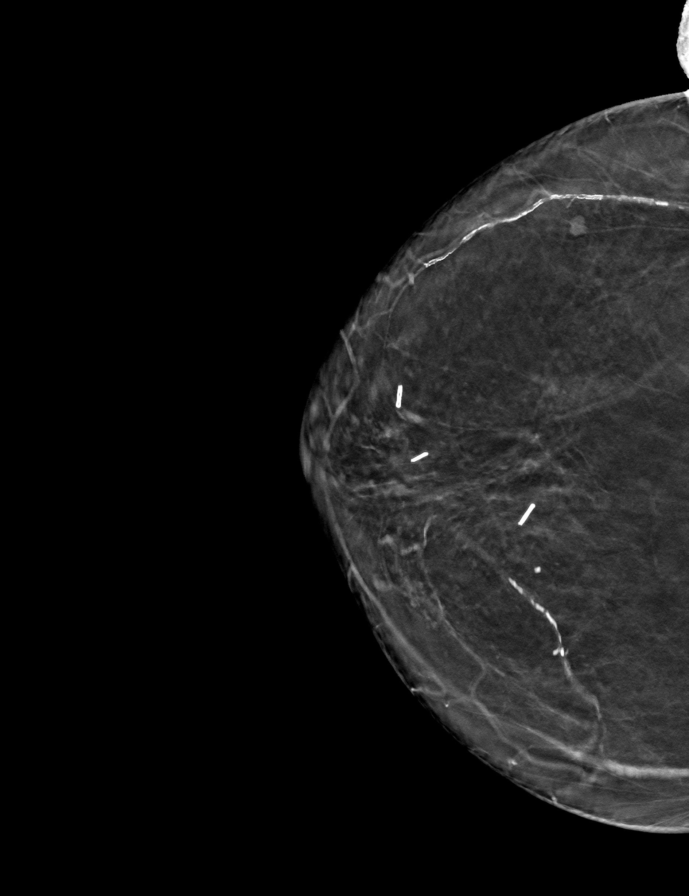

[9 of 24 positions shown; findings below may reference images not displayed]

ACR Breast Density Category b: There are scattered areas of
fibroglandular density.
FINDINGS: There are no findings suspicious for malignancy.
IMPRESSION: No mammographic evidence of malignancy. A result letter of this
screening mammogram will be mailed directly to the patient.

RECOMMENDATION:
Screening mammogram in one year. (Code:51-O-LD2)

BI-RADS CATEGORY  1: Negative.

## 2023-01-05 ENCOUNTER — Other Ambulatory Visit (HOSPITAL_COMMUNITY): Payer: Self-pay | Admitting: Internal Medicine

## 2023-01-05 DIAGNOSIS — I5022 Chronic systolic (congestive) heart failure: Secondary | ICD-10-CM

## 2023-01-13 ENCOUNTER — Other Ambulatory Visit (HOSPITAL_COMMUNITY): Payer: Self-pay | Admitting: Internal Medicine

## 2023-01-13 DIAGNOSIS — I5022 Chronic systolic (congestive) heart failure: Secondary | ICD-10-CM

## 2023-03-04 ENCOUNTER — Emergency Department (HOSPITAL_COMMUNITY)
Admission: EM | Admit: 2023-03-04 | Discharge: 2023-03-04 | Disposition: A | Discharge status: Home / Self Care | Payer: PPO | Attending: Emergency Medicine | Admitting: Emergency Medicine

## 2023-03-04 ENCOUNTER — Emergency Department (HOSPITAL_COMMUNITY): Payer: PPO

## 2023-03-04 ENCOUNTER — Encounter (HOSPITAL_COMMUNITY): Payer: Self-pay

## 2023-03-04 ENCOUNTER — Other Ambulatory Visit: Payer: Self-pay

## 2023-03-04 DIAGNOSIS — S022XXA Fracture of nasal bones, initial encounter for closed fracture: Secondary | ICD-10-CM | POA: Diagnosis not present

## 2023-03-04 DIAGNOSIS — R040 Epistaxis: Secondary | ICD-10-CM | POA: Insufficient documentation

## 2023-03-04 DIAGNOSIS — W19XXXA Unspecified fall, initial encounter: Secondary | ICD-10-CM

## 2023-03-04 DIAGNOSIS — S0990XA Unspecified injury of head, initial encounter: Secondary | ICD-10-CM | POA: Diagnosis not present

## 2023-03-04 DIAGNOSIS — Z7982 Long term (current) use of aspirin: Secondary | ICD-10-CM | POA: Insufficient documentation

## 2023-03-04 DIAGNOSIS — M79621 Pain in right upper arm: Secondary | ICD-10-CM | POA: Insufficient documentation

## 2023-03-04 DIAGNOSIS — Y92481 Parking lot as the place of occurrence of the external cause: Secondary | ICD-10-CM | POA: Insufficient documentation

## 2023-03-04 DIAGNOSIS — Z794 Long term (current) use of insulin: Secondary | ICD-10-CM | POA: Diagnosis not present

## 2023-03-04 DIAGNOSIS — S00511A Abrasion of lip, initial encounter: Secondary | ICD-10-CM | POA: Insufficient documentation

## 2023-03-04 DIAGNOSIS — S0081XA Abrasion of other part of head, initial encounter: Secondary | ICD-10-CM | POA: Insufficient documentation

## 2023-03-04 DIAGNOSIS — W01198A Fall on same level from slipping, tripping and stumbling with subsequent striking against other object, initial encounter: Secondary | ICD-10-CM | POA: Diagnosis not present

## 2023-03-04 DIAGNOSIS — S0992XA Unspecified injury of nose, initial encounter: Secondary | ICD-10-CM | POA: Diagnosis present

## 2023-03-04 LAB — CBC WITH DIFFERENTIAL/PLATELET
Abs Immature Granulocytes: 0.02 10*3/uL (ref 0.00–0.07)
Basophils Absolute: 0 10*3/uL (ref 0.0–0.1)
Basophils Relative: 0 %
Eosinophils Absolute: 0.1 10*3/uL (ref 0.0–0.5)
Eosinophils Relative: 1 %
HCT: 37.5 % (ref 36.0–46.0)
Hemoglobin: 12.1 g/dL (ref 12.0–15.0)
Immature Granulocytes: 0 %
Lymphocytes Relative: 17 %
Lymphs Abs: 1.3 10*3/uL (ref 0.7–4.0)
MCH: 31.8 pg (ref 26.0–34.0)
MCHC: 32.3 g/dL (ref 30.0–36.0)
MCV: 98.4 fL (ref 80.0–100.0)
Monocytes Absolute: 0.4 10*3/uL (ref 0.1–1.0)
Monocytes Relative: 6 %
Neutro Abs: 5.8 10*3/uL (ref 1.7–7.7)
Neutrophils Relative %: 76 %
Platelets: 109 10*3/uL — ABNORMAL LOW (ref 150–400)
RBC: 3.81 MIL/uL — ABNORMAL LOW (ref 3.87–5.11)
RDW: 15.7 % — ABNORMAL HIGH (ref 11.5–15.5)
WBC: 7.6 10*3/uL (ref 4.0–10.5)
nRBC: 0 % (ref 0.0–0.2)

## 2023-03-04 LAB — BASIC METABOLIC PANEL
Anion gap: 13 (ref 5–15)
BUN: 27 mg/dL — ABNORMAL HIGH (ref 8–23)
CO2: 24 mmol/L (ref 22–32)
Calcium: 9 mg/dL (ref 8.9–10.3)
Chloride: 100 mmol/L (ref 98–111)
Creatinine, Ser: 1.18 mg/dL — ABNORMAL HIGH (ref 0.44–1.00)
GFR, Estimated: 44 mL/min — ABNORMAL LOW (ref >60)
Glucose, Bld: 155 mg/dL — ABNORMAL HIGH (ref 70–99)
Potassium: 3.7 mmol/L (ref 3.5–5.1)
Sodium: 137 mmol/L (ref 135–145)

## 2023-03-04 MED ORDER — OXYMETAZOLINE HCL 0.05 % NA SOLN
1.0000 | Freq: Once | NASAL | Status: AC
  Administered 2023-03-04: 1 via NASAL
  Filled 2023-03-04: qty 30

## 2023-03-04 MED ORDER — ACETAMINOPHEN 325 MG PO TABS: 650.0000 mg | ORAL_TABLET | Freq: Four times a day (QID) | ORAL | Status: DC | PRN

## 2023-03-04 MED ORDER — TRANEXAMIC ACID FOR EPISTAXIS
500.0000 mg | Freq: Once | TOPICAL | Status: AC
  Administered 2023-03-04: 500 mg via TOPICAL
  Filled 2023-03-04: qty 10

## 2023-03-04 MED ORDER — LIDOCAINE-EPINEPHRINE (PF) 2 %-1:200000 IJ SOLN
20.0000 mL | Freq: Once | INTRAMUSCULAR | Status: AC
  Administered 2023-03-04: 20 mL
  Filled 2023-03-04: qty 20

## 2023-03-04 NOTE — ED Provider Notes (Signed)
[facility] EMERGENCY DEPARTMENT AT [facility] Provider Note   CSN: [id number] Arrival date & time: 03/04/23  1744     History  Chief Complaint  Patient presents with   Marletta Lor    Rebecca Hall is a 89 y.o. female.   Fall     89 year old female presenting to the emergency department as a nonlevel trauma after a fall.  The patient states that she lost her balance and fell forward onto her face in the parking lot.  She denies loss of consciousness.  She is not on anticoagulation.  She sustained a swollen nose, abrasions to her face and states that she has had a persistent nosebleed ever since the fall.  She thinks she may have cracked her teeth.  Complains of pain in her right upper arm.  Home Medications Prior to Admission medications   Medication Sig Start Date End Date Taking? Authorizing Provider  acetaminophen (TYLENOL) 650 MG CR tablet Take 650 mg by mouth every 8 (eight) hours as needed for pain.    [provider]  allopurinol (ZYLOPRIM) 100 MG tablet Take 100 mg by mouth daily.    [provider]  allopurinol (ZYLOPRIM) 300 MG tablet Take 300 mg by mouth daily.    [provider]  amLODipine (NORVASC) 2.5 MG tablet TAKE ONE TABLET BY MOUTH ONE TIME DAILY 04/12/21   Bensimhon, Bevelyn Buckles, MD  aspirin 81 MG tablet Take 81 mg by mouth at bedtime.     [provider]  atorvastatin (LIPITOR) 40 MG tablet TAKE ONE TABLET BY MOUTH IN THE MORNING 04/11/20   Bensimhon, Bevelyn Buckles, MD  calcium carbonate (OS-CAL) 600 MG TABS tablet Take 600 mg by mouth 2 (two) times daily with a meal.     [provider]  carvedilol (COREG) 3.125 MG tablet TAKE ONE TABLET BY MOUTH TWICE DAILY 01/14/23   Bensimhon, Bevelyn Buckles, MD  denosumab (PROLIA) 60 MG/ML SOLN injection Inject 60 mg into the skin every 6 (six) months. Administer in upper arm, thigh, or abdomen    [provider]  Dulaglutide (TRULICITY) 0.75 MG/0.5ML SOPN Inject 0.75 mg into the  skin. 11/20/21   Stoneking, Ann Maki, MD  Dulaglutide 0.75 MG/0.5ML SOPN Inject 0.75 mg into the skin once a week.    [provider]  fenofibrate (TRICOR) 145 MG tablet Take 145 mg by mouth daily. 10/02/19   [provider]  furosemide (LASIX) 20 MG tablet Take 1 tab Daily every other day ALTERNATING with 2 tabs Daily every other day 02/23/21   Bensimhon, Bevelyn Buckles, MD  gabapentin (NEURONTIN) 300 MG capsule TAKE ONE CAPSULE BY MOUTH AT BEDTIME  03/30/18   Magrinat, Valentino Hue, MD  glimepiride (AMARYL) 2 MG tablet Take 2 mg by mouth daily with breakfast.    [provider]  loratadine (CLARITIN) 10 MG tablet Take 10 mg by mouth every morning.     [provider]  Melatonin-Pyridoxine (MELATIN PO) Take 5 mg by mouth at bedtime.    [provider]  Multiple Vitamin (MULTIVITAMIN WITH MINERALS) TABS tablet Take 1 tablet by mouth 2 (two) times daily.     [provider]  omeprazole (PRILOSEC) 20 MG capsule Take 20 mg by mouth daily.  03/08/13   [provider]      Allergies    Levaquin [levofloxacin in d5w], Codeine, Herceptin [trastuzumab], Sulfa antibiotics, Tape, Aldactone [spironolactone], and Sulfamethoxazole-trimethoprim    Review of Systems   Review of Systems  Physical  Exam Updated Vital Signs BP (!) 160/82   Pulse 65   Resp 17   Ht 5\' 1"  (1.549 m)   Wt 67.1 kg   SpO2 97%   BMI 27.96 kg/m  Physical Exam  ED Results / Procedures / Treatments   Labs (all labs ordered are listed, but only abnormal results are displayed) Labs Reviewed - No data to display  EKG None  Radiology No results found.  Procedures .Epistaxis Management  Date/Time: 03/04/2023 11:26 PM  Performed by: Ernie Avena, MD Authorized by: Ernie Avena, MD   Consent:    Consent obtained:  Verbal   Consent given by:  Patient   Risks discussed:  Bleeding and infection Universal protocol:    Patient identity confirmed:  Verbally with  patient Anesthesia:    Anesthesia method:  Topical application   Topical anesthesia: TXA, lidocaine with epi, Afrin. Procedure details:    Treatment site:  L anterior   Treatment method:  Merocel sponge   Treatment complexity:  Extensive   Treatment episode: initial   Post-procedure details:    Assessment:  Bleeding decreased   Procedure completion:  Tolerated   {Document cardiac monitor, telemetry assessment procedure when appropriate:1}  Medications Ordered in ED Medications - No data to display  ED Course/ Medical Decision Making/ A&P   {   Click here for ABCD2, HEART and other calculatorsREFRESH Note before signing :1}                              Medical Decision Making Amount and/or Complexity of Data Reviewed Labs: ordered. Radiology: ordered.  Risk OTC drugs. Prescription drug management.   ***  {Document critical care time when appropriate:1} {Document review of labs and clinical decision tools ie heart score, Chads2Vasc2 etc:1}  {Document your independent review of radiology images, and any outside records:1} {Document your discussion with family members, caretakers, and with consultants:1} {Document social determinants of health affecting pt's care:1} {Document your decision making why or why not admission, treatments were needed:1} Final Clinical Impression(s) / ED Diagnoses Final diagnoses:  None    Rx / DC Orders ED Discharge Orders     None

## 2023-03-04 NOTE — ED Notes (Signed)
Dental box at bedside. 

## 2023-03-04 NOTE — ED Notes (Signed)
ENT cart and dermabond at bedside.

## 2023-03-04 NOTE — Discharge Instructions (Addendum)
Please follow-up with ENT for removal of your nasal packing and for further management of your nasal fracture.  Please follow-up with dentistry for your loose tooth.  The remainder of your CT imaging showed no evidence of intracranial injury, no evidence of fracture or dislocation in your spine and no evidence of facial fracture other than your nose fracture. Return for any recurrence of significant bleeding

## 2023-03-04 NOTE — ED Notes (Signed)
Patient transported to CT 

## 2023-03-04 NOTE — ED Triage Notes (Signed)
Patient bib [facility] after a fall. Patient lost balance and fell on her face in the parking lot, unsure of how long she was down. Patient denies thinners and denies LOC. Patient has a swollen nose, lacerations on nose and upper lip, possible cracked teeth, bruising to her palms. Complaints of right  shoulder and upper arm pain. Patient alert and oriented on arrival to the ED

## 2023-03-10 ENCOUNTER — Encounter (INDEPENDENT_AMBULATORY_CARE_PROVIDER_SITE_OTHER): Payer: Self-pay | Admitting: Otolaryngology

## 2023-03-10 ENCOUNTER — Ambulatory Visit (INDEPENDENT_AMBULATORY_CARE_PROVIDER_SITE_OTHER): Payer: PPO | Admitting: Otolaryngology

## 2023-03-10 VITALS — BP 151/85 | HR 67 | Ht 60.0 in | Wt 145.0 lb

## 2023-03-10 DIAGNOSIS — Z4800 Encounter for change or removal of nonsurgical wound dressing: Secondary | ICD-10-CM | POA: Diagnosis not present

## 2023-03-10 DIAGNOSIS — W19XXXA Unspecified fall, initial encounter: Secondary | ICD-10-CM

## 2023-03-10 DIAGNOSIS — R040 Epistaxis: Secondary | ICD-10-CM | POA: Diagnosis not present

## 2023-03-10 DIAGNOSIS — S022XXA Fracture of nasal bones, initial encounter for closed fracture: Secondary | ICD-10-CM

## 2023-03-10 DIAGNOSIS — J320 Chronic maxillary sinusitis: Secondary | ICD-10-CM

## 2023-03-10 DIAGNOSIS — J322 Chronic ethmoidal sinusitis: Secondary | ICD-10-CM

## 2023-03-10 MED ORDER — SALINE SPRAY 0.65 % NA SOLN: 2.0000 | NASAL | 0 refills | Status: AC | PRN

## 2023-03-10 MED ORDER — FLUTICASONE PROPIONATE 50 MCG/ACT NA SUSP: 2.0000 | Freq: Every day | NASAL | 6 refills | Status: DC

## 2023-03-10 NOTE — Progress Notes (Addendum)
Otolaryngology Clinic Note Referring physician: ED f/u HPI:  Rebecca Hall is a 89 y.o. female kindly referred for ED follow up after trauma. PMHx: Breast Cancer, CKD, T2DM, HLD, Peripheral Neuropathy  Patient reports she had a ground level fall on 9/10, and went to the ED. No LOC. On independent review of notes, she had left sided epistaxis was controlled with nasal packing. CT Face on independent review showed nasal bone fracture.  She presents today for pack removal. She does report some intermittent blood tinged drainage from the back of the throat.  She is currently on amoxicillin for dental trauma.  Epistaxis history: denies frequent epistaxis HTN: Yes CKD/Liver dysfunction: does report CKD Anticoagulation/AP: On ASA 81 Trauma: yes History of Sinusitis: yes, reports spring and fall; reports discolored drainage; most recently in spring; takes PO antihistamine; no sprays.  Nasal obstruction: Denies on right Nasal procedures: no Current nasal medication use: no  Of note, patient does report some hoarseness with voice use. Denies trouble swallowing. Voice is sufficient for her use. Has seen Dr. Suszanne Conners in the past who has scoped her and did not find anything "useful."  Patient additionally denies other lacerations, malocclusion, teeth instability, trismus, enophthalmos, hypoglobus, vision loss or change, significant facial deformity, trouble chewing or swallowing, epistaxis hearing loss after trauma, otorrhea, vertigo.   PMH/Meds/All/SocHx/FamHx/ROS:   Past Medical History:  Diagnosis Date   Arthritis    Breast cancer (HCC) 03/15/2013   right, 12 o'clock   Chronic kidney disease    Diabetes mellitus without complication (HCC)    GERD (gastroesophageal reflux disease)    History of lower GI bleeding    Hot flashes related to aromatase inhibitor therapy 06/21/2013   Hyperkalemia 12/21/2015   Hyperlipidemia    Hypertension    IBS (irritable bowel syndrome)    Neuromuscular disorder  (HCC)    peripheral neuropathy feet   Osteoporosis    Personal history of chemotherapy    Past Surgical History:  Procedure Laterality Date   ABDOMINAL HYSTERECTOMY  1975   No salpingo-oophorectomy   BREAST BIOPSY Right 2014   malignant   BREAST BIOPSY Left 04/12/2013   benign x2   BREAST LUMPECTOMY Right 2014   BREAST LUMPECTOMY WITH NEEDLE LOCALIZATION AND AXILLARY SENTINEL LYMPH NODE BX Right 04/28/2013   Procedure: BREAST LUMPECTOMY WITH NEEDLE LOCALIZATION AND AXILLARY SENTINEL LYMPH NODE BX;  Surgeon: Emelia Loron, MD;  Location: [facility];  Service: General;  Laterality: Right;   CARDIAC CATHETERIZATION N/A 01/26/2015   Procedure: Right/Left Heart Cath and Coronary Angiography;  Surgeon: Dolores Patty, MD;  Location: [facility] INVASIVE CV LAB;  Service: Cardiovascular;  Laterality: N/A;   CATARACT EXTRACTION  2009   both   COLONOSCOPY     SKIN CANCER EXCISION     head, face, hand ..multiple years   TONSILLECTOMY     Denies history of head or neck surgery except tonsillectomy  Family History  Problem Relation Age of Onset   Cerebral aneurysm Father    Cancer Maternal Aunt 17       colon cancer   Cancer Maternal Uncle 65       lung   Cancer Maternal Grandmother        pancreas   Breast cancer Neg Hx    No family history of bleeding disorders or difficulty with anesthesia  Social Connections: Not on file     Current Outpatient Medications:    fluticasone (FLONASE) 50 MCG/ACT nasal spray, Place 2 sprays into both nostrils  daily., Disp: 16 g, Rfl: 6   sodium chloride (OCEAN) 0.65 % SOLN nasal spray, Place 2 sprays into both nostrils every 4 (four) hours as needed for congestion., Disp: 15 mL, Rfl: 0   acetaminophen (TYLENOL) 650 MG CR tablet, Take 650 mg by mouth every 8 (eight) hours as needed for pain., Disp: , Rfl:    allopurinol (ZYLOPRIM) 100 MG tablet, Take 100 mg by mouth daily., Disp: , Rfl:    allopurinol (ZYLOPRIM) 300 MG tablet, Take 300 mg  by mouth daily., Disp: , Rfl:    amLODipine (NORVASC) 2.5 MG tablet, TAKE ONE TABLET BY MOUTH ONE TIME DAILY, Disp: 90 tablet, Rfl: 3   aspirin 81 MG tablet, Take 81 mg by mouth at bedtime. , Disp: , Rfl:    atorvastatin (LIPITOR) 40 MG tablet, TAKE ONE TABLET BY MOUTH IN THE MORNING, Disp: 30 tablet, Rfl: 10   calcium carbonate (OS-CAL) 600 MG TABS tablet, Take 600 mg by mouth 2 (two) times daily with a meal. , Disp: , Rfl:    carvedilol (COREG) 3.125 MG tablet, TAKE ONE TABLET BY MOUTH TWICE DAILY, Disp: 180 tablet, Rfl: 1   denosumab (PROLIA) 60 MG/ML SOLN injection, Inject 60 mg into the skin every 6 (six) months. Administer in upper arm, thigh, or abdomen, Disp: , Rfl:    Dulaglutide (TRULICITY) 0.75 MG/0.5ML SOPN, Inject 0.75 mg into the skin., Disp: 2 mL, Rfl: 4   Dulaglutide 0.75 MG/0.5ML SOPN, Inject 0.75 mg into the skin once a week., Disp: , Rfl:    fenofibrate (TRICOR) 145 MG tablet, Take 145 mg by mouth daily., Disp: , Rfl:    furosemide (LASIX) 20 MG tablet, Take 1 tab Daily every other day ALTERNATING with 2 tabs Daily every other day, Disp: 45 tablet, Rfl: 11   gabapentin (NEURONTIN) 300 MG capsule, TAKE ONE CAPSULE BY MOUTH AT BEDTIME , Disp: 90 capsule, Rfl: 0   glimepiride (AMARYL) 2 MG tablet, Take 2 mg by mouth daily with breakfast., Disp: , Rfl:    loratadine (CLARITIN) 10 MG tablet, Take 10 mg by mouth every morning. , Disp: , Rfl:    Melatonin-Pyridoxine (MELATIN PO), Take 5 mg by mouth at bedtime., Disp: , Rfl:    Multiple Vitamin (MULTIVITAMIN WITH MINERALS) TABS tablet, Take 1 tablet by mouth 2 (two) times daily. , Disp: , Rfl:    omeprazole (PRILOSEC) 20 MG capsule, Take 20 mg by mouth daily. , Disp: , Rfl:   A 20-point ROS was performed with pertinent positives/negatives noted in the HPI.   Physical Exam:   BP (!) 151/85 (BP Location: Left Arm, Patient Position: Sitting)   Pulse 67   Ht 5' (1.524 m)   Wt 145 lb (65.8 kg)   SpO2 91%   BMI 28.32 kg/m    Salient  findings:  CN II-XII intact  Bilateral EAC clear and TM intact with well pneumatized middle ear spaces Anterior rhinoscopy: Septum S deviation, low right; bilateral inferior turbinates with mild hypertrophy after pack removal on left (see below) Voice quality class 2 No lesions of oral cavity/oropharynx - no active bleeding; dentition without obvious mobile teeth but diffuse intraoral and facial bruising Midface stable without stepoffs no significant trismus No obviously palpable neck masses/lymphadenopathy/thyromegaly No respiratory distress or stridor  Independent Review of Additional Tests or Records:  ED Notes independently reviewed and summarized above CT Face independently reviewed 02/2023: septal fracture, mild bilateral nasal fracture, right OMC pattern opacification; query mild right max anterior wall fx;  posterior wall defect likely chronic. No other significant midface fx identified.  Procedures:  PROCEDURE:  Removal of left nasal pack Pre-procedure diagnosis: Left Nasal pack placement after epistaxis Post-procedure diagnosis: same Indication: See pre-procedure diagnosis and physical exam above Complications: None apparent EBL: minimal Anesthesia: Lidocaine 4% and topical decongestant was topically sprayed in each nasal cavity  Description of Procedure:  Patient was identified and pack was moistened with anesthetic and decongestant below. Pack was removed. No active bleeding as noted in oropharynx or  nasal. Overall, signs of mucosal inflammation are not noted. No mucopurulence, polyps, or masses noted.    CPT CODE -- none  Impression & Plans:  Taquia Pizzola is a 89 y.o. female seen for ED f/u after trauma with nasal septal fracture, bilateral nasal bone fracture, and other mild facial fractures as noted above. Pack placed for epistaxis, removed today without issue. We discussed her CT sinonasal opacification and symptoms -  she does report intermittent sinusitis for which she  wishes to not have any surgery but further examination once she recovers. She denies or obstruction or significant facial deformity. She would like to avoid any operative procedures, which is reasonable. We discussed epistaxis precautions.  Facial fractures - nasal bone, septal, and anterior maxillary wall and possible right medial orbital wall - No nose blowing, no acute indication for intervention given no significant cosmetic deformity or nasal obstruction Right sinusitis - no significant symptoms currently, no evidence of bacterial infection currently but reports fall allergic rhinitis sx; wishes for further evaluation once she recovers from her fall - Flonase 2 puffs bid otc PRN - Continue PO antihistamine 3. Epistaxis with prior nasal pack placement - removed - Epistaxis precautions including no nose blowing x 1 week discussed - Ocean spray 2 sprays each nostril q4h PRN  - If voice symptoms continue, suspect presbyphonia and can consider flexible endoscopy at next visit  RTC 3 months  MDM:  Level 4: 99204 Complexity/Problems addressed: Data complexity: Moderate (indp interpretation of CT, review of external notes) - Morbidity: Low - Prescription Drug prescribed or managed: yes - flonase, ocean spray    Thank you for allowing me the opportunity to care for your patient. Please do not hesitate to contact me should you have any other questions.  Sincerely, Jovita Kussmaul, MD Otolarynoglogist (ENT), [facility] Specialist Phone: [phone] Fax: [phone]  03/10/2023, 1:22 PM

## 2023-04-25 ENCOUNTER — Encounter (HOSPITAL_COMMUNITY): Payer: Self-pay | Admitting: Internal Medicine

## 2023-04-25 ENCOUNTER — Ambulatory Visit (HOSPITAL_COMMUNITY)
Admission: RE | Admit: 2023-04-25 | Discharge: 2023-04-25 | Disposition: A | Discharge status: Home / Self Care | Payer: PPO | Source: Ambulatory Visit | Attending: Internal Medicine | Admitting: Internal Medicine

## 2023-04-25 VITALS — BP 108/66 | HR 57 | Wt 144.0 lb

## 2023-04-25 DIAGNOSIS — I2510 Atherosclerotic heart disease of native coronary artery without angina pectoris: Secondary | ICD-10-CM | POA: Diagnosis not present

## 2023-04-25 DIAGNOSIS — R5381 Other malaise: Secondary | ICD-10-CM | POA: Insufficient documentation

## 2023-04-25 DIAGNOSIS — N1832 Chronic kidney disease, stage 3b: Secondary | ICD-10-CM | POA: Insufficient documentation

## 2023-04-25 DIAGNOSIS — E1142 Type 2 diabetes mellitus with diabetic polyneuropathy: Secondary | ICD-10-CM | POA: Diagnosis not present

## 2023-04-25 DIAGNOSIS — I428 Other cardiomyopathies: Secondary | ICD-10-CM | POA: Diagnosis not present

## 2023-04-25 DIAGNOSIS — Z853 Personal history of malignant neoplasm of breast: Secondary | ICD-10-CM | POA: Insufficient documentation

## 2023-04-25 DIAGNOSIS — I130 Hypertensive heart and chronic kidney disease with heart failure and stage 1 through stage 4 chronic kidney disease, or unspecified chronic kidney disease: Secondary | ICD-10-CM | POA: Insufficient documentation

## 2023-04-25 DIAGNOSIS — E785 Hyperlipidemia, unspecified: Secondary | ICD-10-CM | POA: Diagnosis not present

## 2023-04-25 DIAGNOSIS — E1122 Type 2 diabetes mellitus with diabetic chronic kidney disease: Secondary | ICD-10-CM | POA: Insufficient documentation

## 2023-04-25 DIAGNOSIS — I5032 Chronic diastolic (congestive) heart failure: Secondary | ICD-10-CM | POA: Diagnosis not present

## 2023-04-25 DIAGNOSIS — I10 Essential (primary) hypertension: Secondary | ICD-10-CM

## 2023-04-25 DIAGNOSIS — Z79899 Other long term (current) drug therapy: Secondary | ICD-10-CM | POA: Insufficient documentation

## 2023-04-25 NOTE — Addendum Note (Signed)
Encounter addended by: Noralee Space, RN on: 04/25/2023 3:18 PM  Actions taken: Clinical Note Signed

## 2023-04-25 NOTE — Patient Instructions (Addendum)
Good to see you today!  Your physician recommends that you schedule a follow-up appointment in: 1 year   If you have any questions or concerns before your next appointment please send [place] a message through [place] or call our office at [phone].    TO LEAVE A MESSAGE FOR THE NURSE SELECT OPTION 2, PLEASE LEAVE A MESSAGE INCLUDING: YOUR NAME DATE OF BIRTH CALL BACK NUMBER REASON FOR CALL**this is important as we prioritize the call backs  YOU WILL RECEIVE A CALL BACK THE SAME DAY AS LONG AS YOU CALL BEFORE 4:00 PM  At the [facility], you and your health needs are our priority. As part of our continuing mission to provide you with exceptional heart care, we have created designated Provider Care Teams. These Care Teams include your primary Cardiologist (physician) and Advanced Practice Providers (APPs- Physician Assistants and Nurse Practitioners) who all work together to provide you with the care you need, when you need it.   You may see any of the following providers on your designated Care Team at your next follow up: Dr Arvilla Meres Dr Marca Ancona Dr. Dorthula Nettles Dr. Clearnce Hasten Amy Filbert Schilder, NP Robbie Lis, [facility] [place], [place] Brynda Peon, NP [place] Lee, NP Karle Plumber, PharmD   Please be sure to bring in all your medications bottles to every appointment.    Thank you for choosing [facility]

## 2023-04-25 NOTE — Progress Notes (Signed)
[facility] Note   Patient ID: Rebecca Hall, female   DOB: [birth year], 89 y.o.   MRN: [id number] PCP: Thana Ates, MD Primary HF: Dr Gala Romney Oncologist: Dr Darnelle Catalan  HPI: Rebecca Hall is a 89 y.o. female with history of Breast Cancer treated with 3 doses of herceptin in 2014, CKD, HTN, HL, GERD, Hx of GI Bleed, and DM.  Admitted to [facility] 8/16 with hypovolemic shock, AKI creatinine 4.7. After fluid resuscitation developed pulmonary edema. Found to have new onset LV dysfunction with EF 30-35% in 8/16.. Had cath as noted below with 1v CAD with 60% proximal LAD stenosis and NICM. Discharged on lasix, carvedilol, and Entresto.  Echo 12/16 EF 55-60%,  Echo 7/18: EF 55-60%  Here for 2 year f/u. Still at [facility]. Balance is poor. Gets PT/OT 2x/week. Uses a walker. Had a fall about 6 weeks ago. BP at home 120-130s. Edema has improved with recent increase in lasix. No SOB, orthopnea or PND.   Echo  02/23/21 EF 65-70% Normal RV G1DD  Cardiac studies:  [facility] 01/27/2015  RA = 11 RV = 42/8/15 PA = 38/8 (24) PCW = 19 Fick cardiac output/index = 5.2/3.0 Thermo CO/CI = 5.7/3.2 PVR = 1.0 WU FA sat = 93% PA sat = 51%, 51% Ao Pressure: 133/60 (85) LV Pressure:  133/9/17 No aortic stenosis  Assessment: 1) Moderate 1v CAD with 60% proximal LAD lesion 2) Non-ischemic CM with EF 30-35% by echo 3) Mildly elevated R-sided pressures with normal cardiac output   Review of systems complete and found to be negative unless listed in HPI.    SH:  Social History   Socioeconomic History   Marital status: Widowed    Spouse name: Not on file   Number of children: Not on file   Years of education: Not on file   Highest education level: Not on file  Occupational History   Not on file  Tobacco Use   Smoking status: Never   Smokeless tobacco: Never  Substance and Sexual Activity   Alcohol use: No   Drug use: No   Sexual activity: Not on file    Comment: first live birth  age 45, P 2, Estrogen   Other Topics Concern   Not on file  Social History Narrative   Not on file   Social Determinants of Health   Financial Resource Strain: Not on file  Food Insecurity: Not on file  Transportation Needs: Not on file  Physical Activity: Not on file  Stress: Not on file  Social Connections: Not on file  Intimate Partner Violence: Not on file    FH:  Family History  Problem Relation Age of Onset   Cerebral aneurysm Father    Cancer Maternal Aunt 69       colon cancer   Cancer Maternal Uncle 24       lung   Cancer Maternal Grandmother        pancreas   Breast cancer Neg Hx     Past Medical History:  Diagnosis Date   Arthritis    Breast cancer (HCC) 03/15/2013   right, 12 o'clock   Chronic kidney disease    Diabetes mellitus without complication (HCC)    GERD (gastroesophageal reflux disease)    History of lower GI bleeding    Hot flashes related to aromatase inhibitor therapy 06/21/2013   Hyperkalemia 12/21/2015   Hyperlipidemia    Hypertension    IBS (irritable bowel syndrome)  Neuromuscular disorder (HCC)    peripheral neuropathy feet   Osteoporosis    Personal history of chemotherapy     Current Outpatient Medications  Medication Sig Dispense Refill   acetaminophen (TYLENOL) 650 MG CR tablet Take 650 mg by mouth every 8 (eight) hours as needed for pain.     allopurinol (ZYLOPRIM) 100 MG tablet Take 100 mg by mouth daily.     allopurinol (ZYLOPRIM) 300 MG tablet Take 300 mg by mouth daily.     amLODipine (NORVASC) 2.5 MG tablet TAKE ONE TABLET BY MOUTH ONE TIME DAILY 90 tablet 3   aspirin 81 MG tablet Take 81 mg by mouth at bedtime.      atorvastatin (LIPITOR) 40 MG tablet TAKE ONE TABLET BY MOUTH IN THE MORNING 30 tablet 10   calcium carbonate (OS-CAL) 600 MG TABS tablet Take 600 mg by mouth 2 (two) times daily with a meal.      carvedilol (COREG) 3.125 MG tablet TAKE ONE TABLET BY MOUTH TWICE DAILY 180 tablet 1   denosumab (PROLIA) 60  MG/ML SOLN injection Inject 60 mg into the skin every 6 (six) months. Administer in upper arm, thigh, or abdomen     Dulaglutide 0.75 MG/0.5ML SOPN Inject 0.75 mg into the skin once a week. On Mondays     fluticasone (FLONASE) 50 MCG/ACT nasal spray Place 2 sprays into both nostrils daily. 16 g 6   furosemide (LASIX) 20 MG tablet Take 1 tab Daily every other day ALTERNATING with 2 tabs Daily every other day 45 tablet 11   gabapentin (NEURONTIN) 300 MG capsule TAKE ONE CAPSULE BY MOUTH AT BEDTIME  90 capsule 0   glimepiride (AMARYL) 2 MG tablet Take 2 mg by mouth daily with breakfast.     loratadine (CLARITIN) 10 MG tablet Take 10 mg by mouth every morning.      Melatonin-Pyridoxine (MELATIN PO) Take 5 mg by mouth at bedtime.     Multiple Vitamin (MULTIVITAMIN WITH MINERALS) TABS tablet Take 1 tablet by mouth 2 (two) times daily.      omeprazole (PRILOSEC) 20 MG capsule Take 20 mg by mouth daily.      sodium chloride (OCEAN) 0.65 % SOLN nasal spray Place 2 sprays into both nostrils every 4 (four) hours as needed for congestion. 15 mL 0   No current facility-administered medications for this encounter.    Vitals:   04/25/23 1442  BP: 108/66  Pulse: (!) 57  SpO2: 96%  Weight: 65.3 kg (144 lb)     Wt Readings from Last 3 Encounters:  04/25/23 65.3 kg (144 lb)  03/10/23 65.8 kg (145 lb)  03/04/23 67.1 kg (148 lb)    PHYSICAL EXAM: General:  Elderly  No resp difficulty HEENT: normal + ecchymosis Neck: supple. no JVD. Carotids 2+ bilat; no bruits. No lymphadenopathy or thryomegaly appreciated. Cor: PMI nondisplaced. Regular rate & rhythm. No rubs, gallops or murmurs. Lungs: clear Abdomen: soft, nontender, nondistended. No hepatosplenomegaly. No bruits or masses. Good bowel sounds. Extremities: no cyanosis, clubbing, rash, tr edema + varicose veins Neuro: alert & orientedx3, cranial nerves grossly intact. moves all 4 extremities w/o difficulty. Affect pleasant   ASSESSMENT &  PLAN:  1. Chronic diastolic CHF:  - She has history of nonischemic cardiomyopathy (EF 30-35% in 8/16). EF now recovered.  - Echo 12/2016 EF 55-60% Normal LV and RV, mild LVH.  - Echo 02/23/21 EF 65-70% Normal RV G1DD - Functional status limited due to age and debility  - Volume  status improved. Taking 20mg  alternating with 40mg  daily + extra as needed - Continue carvedilol 3.125 mg BID - Avoiding SGLT2i age and risk of UTI 2. CKD: Stage 3b - Baseline creatinine 1.5-1.8 - Last scr 1.18 ion 03/04/23 3. Hx of Breast Cancer: Only received 3 doses of Herceptin in 2014. Stopped due to possible allergy. 4. DM2 - stable 5. HTN:  - Well controlled 6. HLD:  - Continue lipitor 40 mg daily 7. Heavy snoring:  - Has refused sleep study.  8. CAD:  - Nonobstructive on prior cath. 8/19 LAD 60% mid - No s/s angina - Continue atorva 40  - ContinueASA 81 mg daily. - Lipids followed by PCP   F/u 2 years  Arvilla Meres, MD  3:09 PM

## 2023-05-30 ENCOUNTER — Ambulatory Visit (INDEPENDENT_AMBULATORY_CARE_PROVIDER_SITE_OTHER): Payer: PPO

## 2023-06-02 ENCOUNTER — Ambulatory Visit (INDEPENDENT_AMBULATORY_CARE_PROVIDER_SITE_OTHER): Payer: PPO | Admitting: Otolaryngology

## 2023-06-19 ENCOUNTER — Ambulatory Visit (INDEPENDENT_AMBULATORY_CARE_PROVIDER_SITE_OTHER): Payer: PPO | Admitting: Otolaryngology

## 2023-06-19 ENCOUNTER — Encounter (INDEPENDENT_AMBULATORY_CARE_PROVIDER_SITE_OTHER): Payer: Self-pay

## 2023-06-19 VITALS — BP 141/70 | HR 60 | Ht 61.0 in | Wt 140.0 lb

## 2023-06-19 DIAGNOSIS — S022XXA Fracture of nasal bones, initial encounter for closed fracture: Secondary | ICD-10-CM | POA: Diagnosis not present

## 2023-06-19 DIAGNOSIS — J383 Other diseases of vocal cords: Secondary | ICD-10-CM

## 2023-06-19 DIAGNOSIS — R490 Dysphonia: Secondary | ICD-10-CM | POA: Diagnosis not present

## 2023-06-19 DIAGNOSIS — J320 Chronic maxillary sinusitis: Secondary | ICD-10-CM

## 2023-06-19 DIAGNOSIS — J329 Chronic sinusitis, unspecified: Secondary | ICD-10-CM

## 2023-06-19 MED ORDER — FLUTICASONE PROPIONATE 50 MCG/ACT NA SUSP: 2.0000 | Freq: Every day | NASAL | 6 refills | Status: AC

## 2023-06-19 NOTE — Progress Notes (Signed)
Otolaryngology Clinic Note Referring physician: ED f/u HPI:  Rebecca Hall is a 89 y.o. female kindly referred for ED follow up after trauma. PMHx: Breast Cancer, CKD, T2DM, HLD, Peripheral Neuropathy  Initial visit (03/10/2023): Patient reports she had a ground level fall on 9/10, and went to the ED. No LOC. On independent review of notes, she had left sided epistaxis was controlled with nasal packing. CT Face on independent review showed nasal bone fracture.  She presents today for pack removal. She does report some intermittent blood tinged drainage from the back of the throat. She is currently on amoxicillin for dental trauma.  Epistaxis history: denies frequent epistaxis HTN: Yes CKD/Liver dysfunction: does report CKD Anticoagulation/AP: On ASA 81 Trauma: yes History of Sinusitis: yes, reports spring and fall; reports discolored drainage; most recently in spring; takes PO antihistamine; no sprays.  Nasal obstruction: Denies on right Nasal procedures: no Current nasal medication use: no  Of note, patient does report some hoarseness with voice use. Denies trouble swallowing. Voice is sufficient for her use. Has seen Dr. Suszanne Conners in the past who has scoped her and did not find anything "useful."  Patient additionally denies other lacerations, malocclusion, teeth instability, trismus, enophthalmos, hypoglobus, vision loss or change, significant facial deformity, trouble chewing or swallowing, epistaxis hearing loss after trauma, otorrhea, vertigo.  ----------------------------------------------- Seen in Follow up today for follow up for dysphonia and facial fractures. 06/19/2023: She reports that she is otherwise doing well. Some slight discoloration of cheeks, but no issues otherwise. No facial pressure/pain, no hyposmia, no discolored drainage from nose. Has not had a sinus infection/symptoms of sinus infection since September. She does get them frequently, but thinks allergy related Voice  without significant change, but still some hoarseness, and swallowing without issue.  Denies any problems with chewing or epistaxis or otorrhea or vertigo.  PMH/Meds/All/SocHx/FamHx/ROS:   Past Medical History:  Diagnosis Date   Arthritis    Breast cancer (HCC) 03/15/2013   right, 12 o'clock   Chronic kidney disease    Diabetes mellitus without complication (HCC)    GERD (gastroesophageal reflux disease)    History of lower GI bleeding    Hot flashes related to aromatase inhibitor therapy 06/21/2013   Hyperkalemia 12/21/2015   Hyperlipidemia    Hypertension    IBS (irritable bowel syndrome)    Neuromuscular disorder (HCC)    peripheral neuropathy feet   Osteoporosis    Personal history of chemotherapy    Past Surgical History:  Procedure Laterality Date   ABDOMINAL HYSTERECTOMY  1975   No salpingo-oophorectomy   BREAST BIOPSY Right 2014   malignant   BREAST BIOPSY Left 04/12/2013   benign x2   BREAST LUMPECTOMY Right 2014   BREAST LUMPECTOMY WITH NEEDLE LOCALIZATION AND AXILLARY SENTINEL LYMPH NODE BX Right 04/28/2013   Procedure: BREAST LUMPECTOMY WITH NEEDLE LOCALIZATION AND AXILLARY SENTINEL LYMPH NODE BX;  Surgeon: Emelia Loron, MD;  Location: [facility];  Service: General;  Laterality: Right;   CARDIAC CATHETERIZATION N/A 01/26/2015   Procedure: Right/Left Heart Cath and Coronary Angiography;  Surgeon: Dolores Patty, MD;  Location: [facility] INVASIVE CV LAB;  Service: Cardiovascular;  Laterality: N/A;   CATARACT EXTRACTION  2009   both   COLONOSCOPY     SKIN CANCER EXCISION     head, face, hand ..multiple years   TONSILLECTOMY     Denies history of head or neck surgery except tonsillectomy  Family History  Problem Relation Age of Onset   Cerebral aneurysm Father  Cancer Maternal Aunt 80       colon cancer   Cancer Maternal Uncle 65       lung   Cancer Maternal Grandmother        pancreas   Breast cancer Neg Hx    No family history of  bleeding disorders or difficulty with anesthesia  Social Connections: Not on file     Current Outpatient Medications:    acetaminophen (TYLENOL) 650 MG CR tablet, Take 650 mg by mouth every 8 (eight) hours as needed for pain., Disp: , Rfl:    allopurinol (ZYLOPRIM) 100 MG tablet, Take 100 mg by mouth daily., Disp: , Rfl:    allopurinol (ZYLOPRIM) 300 MG tablet, Take 300 mg by mouth daily., Disp: , Rfl:    amLODipine (NORVASC) 2.5 MG tablet, TAKE ONE TABLET BY MOUTH ONE TIME DAILY, Disp: 90 tablet, Rfl: 3   aspirin 81 MG tablet, Take 81 mg by mouth at bedtime. , Disp: , Rfl:    atorvastatin (LIPITOR) 40 MG tablet, TAKE ONE TABLET BY MOUTH IN THE MORNING, Disp: 30 tablet, Rfl: 10   calcium carbonate (OS-CAL) 600 MG TABS tablet, Take 600 mg by mouth 2 (two) times daily with a meal. , Disp: , Rfl:    carvedilol (COREG) 3.125 MG tablet, TAKE ONE TABLET BY MOUTH TWICE DAILY, Disp: 180 tablet, Rfl: 1   denosumab (PROLIA) 60 MG/ML SOLN injection, Inject 60 mg into the skin every 6 (six) months. Administer in upper arm, thigh, or abdomen, Disp: , Rfl:    Dulaglutide 0.75 MG/0.5ML SOPN, Inject 0.75 mg into the skin once a week. On Mondays, Disp: , Rfl:    furosemide (LASIX) 20 MG tablet, Take 1 tab Daily every other day ALTERNATING with 2 tabs Daily every other day, Disp: 45 tablet, Rfl: 11   gabapentin (NEURONTIN) 300 MG capsule, TAKE ONE CAPSULE BY MOUTH AT BEDTIME , Disp: 90 capsule, Rfl: 0   glimepiride (AMARYL) 2 MG tablet, Take 2 mg by mouth daily with breakfast., Disp: , Rfl:    loratadine (CLARITIN) 10 MG tablet, Take 10 mg by mouth every morning. , Disp: , Rfl:    Melatonin-Pyridoxine (MELATIN PO), Take 5 mg by mouth at bedtime., Disp: , Rfl:    Multiple Vitamin (MULTIVITAMIN WITH MINERALS) TABS tablet, Take 1 tablet by mouth 2 (two) times daily. , Disp: , Rfl:    omeprazole (PRILOSEC) 20 MG capsule, Take 20 mg by mouth daily. , Disp: , Rfl:    sodium chloride (OCEAN) 0.65 % SOLN nasal spray,  Place 2 sprays into both nostrils every 4 (four) hours as needed for congestion., Disp: 15 mL, Rfl: 0   fluticasone (FLONASE) 50 MCG/ACT nasal spray, Place 2 sprays into both nostrils daily., Disp: 11 mL, Rfl: 6  A 20-point ROS was performed with pertinent positives/negatives noted in the HPI.   Physical Exam:   BP (!) 141/70 (BP Location: Left Arm, Patient Position: Sitting, Cuff Size: Normal)   Pulse 60   Ht 5\' 1"  (1.549 m)   Wt 140 lb (63.5 kg)   SpO2 94%   BMI 26.45 kg/m    Salient findings:  CN II-XII intact  Bilateral EAC clear and TM intact with well pneumatized middle ear spaces Anterior rhinoscopy: Septum S deviation, low right; bilateral inferior turbinates with mild hypertrophy Voice quality class 2; given persistent dysphonia, TFL was performed as below No lesions of oral cavity/oropharynx - dentition intact Midface stable without stepoffs no significant trismus No obviously palpable  neck masses/lymphadenopathy/thyromegaly No respiratory distress or stridor  Independent Review of Additional Tests or Records:  ED Notes independently reviewed and summarized above CT Face independently reviewed 02/2023: septal fracture, mild bilateral nasal fracture, right OMC pattern opacification; query mild right max anterior wall fx; posterior wall defect likely chronic. No other significant midface fx identified.  Procedures:  Procedure Note Pre-procedure diagnosis:  Dysphonia, history of chronic sinusitis Post-procedure diagnosis: Same Procedure: Transnasal Fiberoptic Laryngoscopy, CPT 31575 - Mod 25 Indication: dysphonia, history of chronic sinusitis Complications: None apparent EBL: 0 mL  The procedure was undertaken to further evaluate the patient's complaint of dysphonia and chronic sinusitis, with mirror exam inadequate for appropriate examination due to gag reflex and poor patient tolerance  Procedure:  Patient was identified as correct patient. Verbal consent was obtained.  The nose was sprayed with oxymetazoline and 4% lidocaine. The The flexible laryngoscope was passed through the nose to view the nasal cavity, pharynx (oropharynx, hypopharynx) and larynx.  The larynx was examined at rest and during multiple phonatory tasks. Documentation was obtained and reviewed with patient. The scope was removed. The patient tolerated the procedure well.  Findings: The nasal cavity and nasopharynx did not reveal any masses or lesions, mucosa appeared to be without obvious lesions. The tongue base, pharyngeal walls, piriform sinuses, vallecula, epiglottis and postcricoid region are normal in appearance; no purulence over SE recess/MM (some mucoid secretions). The visualized portion of the subglottis and proximal trachea is widely patent. The vocal folds are mobile bilaterally. There are no lesions on the free edge of the vocal folds nor elsewhere in the larynx worrisome for malignancy.  Age appropriate VF atrophy    Electronically signed by: Read Drivers, MD 06/19/2023 4:32 PM   Impression & Plans:  Rebecca Hall is a 89 y.o. female seen for ED f/u in Sept 2024 after trauma with nasal septal fracture, bilateral nasal bone fracture, and other mild facial fractures as noted above.   She has several issues we discussed in f/u today:  Facial fractures - nasal bone, septal, and anterior maxillary wall and possible right medial orbital wall - Healed, no significant deformity. Follow PRN Right sinusitis - no significant symptoms currently, no evidence of bacterial infection currently but reports fall allergic rhinitis sx; endo today without purulence. Given no recent infxn symptoms, we discussed options and will just observe for now. We discussed her CT sinonasal opacification and symptoms -  she does report intermittent sinusitis for which she wishes to not have any surgery - Continue flonase BID -re-ordered  - Continue PO antihistamine - Daily irrigations PRN 3. 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 Please do not hesitate to contact me should you have any other questions.  Sincerely, Jovita Kussmaul, MD Otolarynoglogist (ENT), [facility] Phone: [phone] Fax: [phone]  06/19/2023, 4:32 PM   I have personally spent 32 minutes involved in face-to-face and non-face-to-face activities for this patient on the day of the visit.  Professional time spent excludes any procedures performed but includes the following activities, in addition to those noted in the documentation: preparing to see the patient (review of outside documentation and results), performing a medically appropriate examination, counseling and educating, ordering medications (flonase), referring and communicating with other healthcare professionals, documenting clinical information in the electronic or other health record, independently interpreting results (prior CT) and communicating results with  the patient

## 2023-08-10 ENCOUNTER — Other Ambulatory Visit (HOSPITAL_COMMUNITY): Payer: Self-pay | Admitting: Internal Medicine

## 2023-08-10 DIAGNOSIS — I5022 Chronic systolic (congestive) heart failure: Secondary | ICD-10-CM

## 2023-09-22 DIAGNOSIS — H0259 Other disorders affecting eyelid function: Secondary | ICD-10-CM | POA: Diagnosis not present

## 2023-09-22 DIAGNOSIS — H02204 Unspecified lagophthalmos left upper eyelid: Secondary | ICD-10-CM | POA: Diagnosis not present

## 2023-10-09 DIAGNOSIS — H18512 Endothelial corneal dystrophy, left eye: Secondary | ICD-10-CM | POA: Diagnosis not present

## 2023-10-09 DIAGNOSIS — H179 Unspecified corneal scar and opacity: Secondary | ICD-10-CM | POA: Diagnosis not present

## 2023-10-09 DIAGNOSIS — H1820 Unspecified corneal edema: Secondary | ICD-10-CM | POA: Diagnosis not present

## 2023-10-16 ENCOUNTER — Other Ambulatory Visit (HOSPITAL_COMMUNITY): Payer: Self-pay | Admitting: Internal Medicine

## 2023-10-16 DIAGNOSIS — R2689 Other abnormalities of gait and mobility: Secondary | ICD-10-CM

## 2023-10-17 ENCOUNTER — Ambulatory Visit (HOSPITAL_COMMUNITY)
Admission: RE | Admit: 2023-10-17 | Discharge: 2023-10-17 | Disposition: A | Discharge status: Home / Self Care | Source: Ambulatory Visit | Attending: Internal Medicine | Admitting: Internal Medicine

## 2023-10-17 DIAGNOSIS — R2689 Other abnormalities of gait and mobility: Secondary | ICD-10-CM | POA: Insufficient documentation

## 2023-11-03 DIAGNOSIS — H0259 Other disorders affecting eyelid function: Secondary | ICD-10-CM | POA: Diagnosis not present

## 2023-11-03 DIAGNOSIS — H169 Unspecified keratitis: Secondary | ICD-10-CM | POA: Diagnosis not present

## 2023-11-24 DIAGNOSIS — R2689 Other abnormalities of gait and mobility: Secondary | ICD-10-CM | POA: Diagnosis not present

## 2023-11-24 DIAGNOSIS — R41841 Cognitive communication deficit: Secondary | ICD-10-CM | POA: Diagnosis not present

## 2023-11-24 DIAGNOSIS — Z9181 History of falling: Secondary | ICD-10-CM | POA: Diagnosis not present

## 2023-11-24 DIAGNOSIS — M6281 Muscle weakness (generalized): Secondary | ICD-10-CM | POA: Diagnosis not present

## 2023-11-24 DIAGNOSIS — M25511 Pain in right shoulder: Secondary | ICD-10-CM | POA: Diagnosis not present

## 2023-12-01 DIAGNOSIS — H0259 Other disorders affecting eyelid function: Secondary | ICD-10-CM | POA: Diagnosis not present

## 2023-12-09 DIAGNOSIS — L84 Corns and callosities: Secondary | ICD-10-CM | POA: Diagnosis not present

## 2023-12-09 DIAGNOSIS — M79672 Pain in left foot: Secondary | ICD-10-CM | POA: Diagnosis not present

## 2023-12-09 DIAGNOSIS — M79671 Pain in right foot: Secondary | ICD-10-CM | POA: Diagnosis not present

## 2023-12-09 DIAGNOSIS — B351 Tinea unguium: Secondary | ICD-10-CM | POA: Diagnosis not present

## 2023-12-09 DIAGNOSIS — E119 Type 2 diabetes mellitus without complications: Secondary | ICD-10-CM | POA: Diagnosis not present

## 2023-12-12 DIAGNOSIS — I5032 Chronic diastolic (congestive) heart failure: Secondary | ICD-10-CM | POA: Diagnosis not present

## 2023-12-12 DIAGNOSIS — N1832 Chronic kidney disease, stage 3b: Secondary | ICD-10-CM | POA: Diagnosis not present

## 2023-12-12 DIAGNOSIS — E1122 Type 2 diabetes mellitus with diabetic chronic kidney disease: Secondary | ICD-10-CM | POA: Diagnosis not present

## 2023-12-12 DIAGNOSIS — I129 Hypertensive chronic kidney disease with stage 1 through stage 4 chronic kidney disease, or unspecified chronic kidney disease: Secondary | ICD-10-CM | POA: Diagnosis not present

## 2023-12-15 DIAGNOSIS — R051 Acute cough: Secondary | ICD-10-CM | POA: Diagnosis not present

## 2023-12-15 DIAGNOSIS — I129 Hypertensive chronic kidney disease with stage 1 through stage 4 chronic kidney disease, or unspecified chronic kidney disease: Secondary | ICD-10-CM | POA: Diagnosis not present

## 2023-12-22 DIAGNOSIS — E1165 Type 2 diabetes mellitus with hyperglycemia: Secondary | ICD-10-CM | POA: Diagnosis not present

## 2023-12-22 DIAGNOSIS — N1832 Chronic kidney disease, stage 3b: Secondary | ICD-10-CM | POA: Diagnosis not present

## 2023-12-22 DIAGNOSIS — I5032 Chronic diastolic (congestive) heart failure: Secondary | ICD-10-CM | POA: Diagnosis not present

## 2023-12-22 DIAGNOSIS — E1122 Type 2 diabetes mellitus with diabetic chronic kidney disease: Secondary | ICD-10-CM | POA: Diagnosis not present

## 2023-12-31 DIAGNOSIS — J40 Bronchitis, not specified as acute or chronic: Secondary | ICD-10-CM | POA: Diagnosis not present

## 2023-12-31 DIAGNOSIS — M810 Age-related osteoporosis without current pathological fracture: Secondary | ICD-10-CM | POA: Diagnosis not present

## 2024-01-16 ENCOUNTER — Ambulatory Visit
Admission: RE | Admit: 2024-01-16 | Discharge: 2024-01-16 | Disposition: A | Discharge status: Home / Self Care | Source: Ambulatory Visit | Attending: Physician Assistant | Admitting: Physician Assistant

## 2024-01-16 ENCOUNTER — Other Ambulatory Visit: Payer: Self-pay | Admitting: Physician Assistant

## 2024-01-16 DIAGNOSIS — R059 Cough, unspecified: Secondary | ICD-10-CM

## 2024-01-16 DIAGNOSIS — I517 Cardiomegaly: Secondary | ICD-10-CM | POA: Diagnosis not present

## 2024-01-22 DIAGNOSIS — I129 Hypertensive chronic kidney disease with stage 1 through stage 4 chronic kidney disease, or unspecified chronic kidney disease: Secondary | ICD-10-CM | POA: Diagnosis not present

## 2024-01-22 DIAGNOSIS — N1832 Chronic kidney disease, stage 3b: Secondary | ICD-10-CM | POA: Diagnosis not present

## 2024-01-22 DIAGNOSIS — E1122 Type 2 diabetes mellitus with diabetic chronic kidney disease: Secondary | ICD-10-CM | POA: Diagnosis not present

## 2024-01-22 DIAGNOSIS — I5032 Chronic diastolic (congestive) heart failure: Secondary | ICD-10-CM | POA: Diagnosis not present

## 2024-01-22 DIAGNOSIS — E1165 Type 2 diabetes mellitus with hyperglycemia: Secondary | ICD-10-CM | POA: Diagnosis not present

## 2024-01-27 DIAGNOSIS — I5032 Chronic diastolic (congestive) heart failure: Secondary | ICD-10-CM | POA: Diagnosis not present

## 2024-01-27 DIAGNOSIS — E1122 Type 2 diabetes mellitus with diabetic chronic kidney disease: Secondary | ICD-10-CM | POA: Diagnosis not present

## 2024-01-27 DIAGNOSIS — N1832 Chronic kidney disease, stage 3b: Secondary | ICD-10-CM | POA: Diagnosis not present

## 2024-01-27 DIAGNOSIS — I129 Hypertensive chronic kidney disease with stage 1 through stage 4 chronic kidney disease, or unspecified chronic kidney disease: Secondary | ICD-10-CM | POA: Diagnosis not present

## 2024-02-20 DIAGNOSIS — R2689 Other abnormalities of gait and mobility: Secondary | ICD-10-CM | POA: Diagnosis not present

## 2024-02-20 DIAGNOSIS — M6281 Muscle weakness (generalized): Secondary | ICD-10-CM | POA: Diagnosis not present

## 2024-02-22 DIAGNOSIS — N1832 Chronic kidney disease, stage 3b: Secondary | ICD-10-CM | POA: Diagnosis not present

## 2024-02-22 DIAGNOSIS — I129 Hypertensive chronic kidney disease with stage 1 through stage 4 chronic kidney disease, or unspecified chronic kidney disease: Secondary | ICD-10-CM | POA: Diagnosis not present

## 2024-02-22 DIAGNOSIS — E1165 Type 2 diabetes mellitus with hyperglycemia: Secondary | ICD-10-CM | POA: Diagnosis not present

## 2024-02-22 DIAGNOSIS — I5032 Chronic diastolic (congestive) heart failure: Secondary | ICD-10-CM | POA: Diagnosis not present

## 2024-02-22 DIAGNOSIS — E1122 Type 2 diabetes mellitus with diabetic chronic kidney disease: Secondary | ICD-10-CM | POA: Diagnosis not present

## 2024-02-23 DIAGNOSIS — R2689 Other abnormalities of gait and mobility: Secondary | ICD-10-CM | POA: Diagnosis not present

## 2024-02-23 DIAGNOSIS — M25511 Pain in right shoulder: Secondary | ICD-10-CM | POA: Diagnosis not present

## 2024-02-23 DIAGNOSIS — M6281 Muscle weakness (generalized): Secondary | ICD-10-CM | POA: Diagnosis not present

## 2024-02-26 DIAGNOSIS — I5032 Chronic diastolic (congestive) heart failure: Secondary | ICD-10-CM | POA: Diagnosis not present

## 2024-02-26 DIAGNOSIS — I129 Hypertensive chronic kidney disease with stage 1 through stage 4 chronic kidney disease, or unspecified chronic kidney disease: Secondary | ICD-10-CM | POA: Diagnosis not present

## 2024-02-26 DIAGNOSIS — E1122 Type 2 diabetes mellitus with diabetic chronic kidney disease: Secondary | ICD-10-CM | POA: Diagnosis not present

## 2024-02-26 DIAGNOSIS — N1832 Chronic kidney disease, stage 3b: Secondary | ICD-10-CM | POA: Diagnosis not present

## 2024-03-04 DIAGNOSIS — I5032 Chronic diastolic (congestive) heart failure: Secondary | ICD-10-CM | POA: Diagnosis not present

## 2024-03-04 DIAGNOSIS — D61818 Other pancytopenia: Secondary | ICD-10-CM | POA: Diagnosis not present

## 2024-03-04 DIAGNOSIS — E1122 Type 2 diabetes mellitus with diabetic chronic kidney disease: Secondary | ICD-10-CM | POA: Diagnosis not present

## 2024-03-04 DIAGNOSIS — E1142 Type 2 diabetes mellitus with diabetic polyneuropathy: Secondary | ICD-10-CM | POA: Diagnosis not present

## 2024-03-04 DIAGNOSIS — R2689 Other abnormalities of gait and mobility: Secondary | ICD-10-CM | POA: Diagnosis not present

## 2024-03-04 DIAGNOSIS — Z8739 Personal history of other diseases of the musculoskeletal system and connective tissue: Secondary | ICD-10-CM | POA: Diagnosis not present

## 2024-03-04 DIAGNOSIS — Z0000 Encounter for general adult medical examination without abnormal findings: Secondary | ICD-10-CM | POA: Diagnosis not present

## 2024-03-04 DIAGNOSIS — M810 Age-related osteoporosis without current pathological fracture: Secondary | ICD-10-CM | POA: Diagnosis not present

## 2024-03-04 DIAGNOSIS — N1832 Chronic kidney disease, stage 3b: Secondary | ICD-10-CM | POA: Diagnosis not present

## 2024-03-04 DIAGNOSIS — Z23 Encounter for immunization: Secondary | ICD-10-CM | POA: Diagnosis not present

## 2024-03-04 DIAGNOSIS — I10 Essential (primary) hypertension: Secondary | ICD-10-CM | POA: Diagnosis not present

## 2024-03-04 DIAGNOSIS — R262 Difficulty in walking, not elsewhere classified: Secondary | ICD-10-CM | POA: Diagnosis not present

## 2024-03-04 DIAGNOSIS — R519 Headache, unspecified: Secondary | ICD-10-CM | POA: Diagnosis not present

## 2024-03-04 DIAGNOSIS — Z1331 Encounter for screening for depression: Secondary | ICD-10-CM | POA: Diagnosis not present

## 2024-03-15 DIAGNOSIS — E119 Type 2 diabetes mellitus without complications: Secondary | ICD-10-CM | POA: Diagnosis not present

## 2024-03-15 DIAGNOSIS — Z961 Presence of intraocular lens: Secondary | ICD-10-CM | POA: Diagnosis not present

## 2024-03-23 DIAGNOSIS — L84 Corns and callosities: Secondary | ICD-10-CM | POA: Diagnosis not present

## 2024-03-23 DIAGNOSIS — I5032 Chronic diastolic (congestive) heart failure: Secondary | ICD-10-CM | POA: Diagnosis not present

## 2024-03-23 DIAGNOSIS — B351 Tinea unguium: Secondary | ICD-10-CM | POA: Diagnosis not present

## 2024-03-23 DIAGNOSIS — M79671 Pain in right foot: Secondary | ICD-10-CM | POA: Diagnosis not present

## 2024-03-23 DIAGNOSIS — M79672 Pain in left foot: Secondary | ICD-10-CM | POA: Diagnosis not present

## 2024-03-23 DIAGNOSIS — E119 Type 2 diabetes mellitus without complications: Secondary | ICD-10-CM | POA: Diagnosis not present

## 2024-03-23 DIAGNOSIS — E1122 Type 2 diabetes mellitus with diabetic chronic kidney disease: Secondary | ICD-10-CM | POA: Diagnosis not present

## 2024-03-23 DIAGNOSIS — E1165 Type 2 diabetes mellitus with hyperglycemia: Secondary | ICD-10-CM | POA: Diagnosis not present

## 2024-03-23 DIAGNOSIS — I129 Hypertensive chronic kidney disease with stage 1 through stage 4 chronic kidney disease, or unspecified chronic kidney disease: Secondary | ICD-10-CM | POA: Diagnosis not present

## 2024-03-23 DIAGNOSIS — N1832 Chronic kidney disease, stage 3b: Secondary | ICD-10-CM | POA: Diagnosis not present

## 2024-03-26 DIAGNOSIS — M6281 Muscle weakness (generalized): Secondary | ICD-10-CM | POA: Diagnosis not present

## 2024-03-26 DIAGNOSIS — M25511 Pain in right shoulder: Secondary | ICD-10-CM | POA: Diagnosis not present

## 2024-03-26 DIAGNOSIS — R2689 Other abnormalities of gait and mobility: Secondary | ICD-10-CM | POA: Diagnosis not present

## 2024-03-27 DIAGNOSIS — I5032 Chronic diastolic (congestive) heart failure: Secondary | ICD-10-CM | POA: Diagnosis not present

## 2024-03-27 DIAGNOSIS — E1122 Type 2 diabetes mellitus with diabetic chronic kidney disease: Secondary | ICD-10-CM | POA: Diagnosis not present

## 2024-03-27 DIAGNOSIS — N1832 Chronic kidney disease, stage 3b: Secondary | ICD-10-CM | POA: Diagnosis not present

## 2024-03-27 DIAGNOSIS — I129 Hypertensive chronic kidney disease with stage 1 through stage 4 chronic kidney disease, or unspecified chronic kidney disease: Secondary | ICD-10-CM | POA: Diagnosis not present

## 2024-04-23 DIAGNOSIS — E1122 Type 2 diabetes mellitus with diabetic chronic kidney disease: Secondary | ICD-10-CM | POA: Diagnosis not present

## 2024-04-23 DIAGNOSIS — N1832 Chronic kidney disease, stage 3b: Secondary | ICD-10-CM | POA: Diagnosis not present

## 2024-04-23 DIAGNOSIS — E1165 Type 2 diabetes mellitus with hyperglycemia: Secondary | ICD-10-CM | POA: Diagnosis not present

## 2024-04-23 DIAGNOSIS — I129 Hypertensive chronic kidney disease with stage 1 through stage 4 chronic kidney disease, or unspecified chronic kidney disease: Secondary | ICD-10-CM | POA: Diagnosis not present

## 2024-04-23 DIAGNOSIS — I5032 Chronic diastolic (congestive) heart failure: Secondary | ICD-10-CM | POA: Diagnosis not present

## 2024-04-25 DIAGNOSIS — M6281 Muscle weakness (generalized): Secondary | ICD-10-CM | POA: Diagnosis not present

## 2024-04-25 DIAGNOSIS — M25511 Pain in right shoulder: Secondary | ICD-10-CM | POA: Diagnosis not present

## 2024-04-26 DIAGNOSIS — N1832 Chronic kidney disease, stage 3b: Secondary | ICD-10-CM | POA: Diagnosis not present

## 2024-04-26 DIAGNOSIS — I5032 Chronic diastolic (congestive) heart failure: Secondary | ICD-10-CM | POA: Diagnosis not present

## 2024-04-26 DIAGNOSIS — E1122 Type 2 diabetes mellitus with diabetic chronic kidney disease: Secondary | ICD-10-CM | POA: Diagnosis not present

## 2024-04-26 DIAGNOSIS — I129 Hypertensive chronic kidney disease with stage 1 through stage 4 chronic kidney disease, or unspecified chronic kidney disease: Secondary | ICD-10-CM | POA: Diagnosis not present

## 2024-04-27 DIAGNOSIS — E119 Type 2 diabetes mellitus without complications: Secondary | ICD-10-CM | POA: Diagnosis not present

## 2024-04-27 DIAGNOSIS — B351 Tinea unguium: Secondary | ICD-10-CM | POA: Diagnosis not present

## 2024-04-27 DIAGNOSIS — M79672 Pain in left foot: Secondary | ICD-10-CM | POA: Diagnosis not present

## 2024-04-27 DIAGNOSIS — M79671 Pain in right foot: Secondary | ICD-10-CM | POA: Diagnosis not present

## 2024-04-27 DIAGNOSIS — L84 Corns and callosities: Secondary | ICD-10-CM | POA: Diagnosis not present

## 2024-05-02 DIAGNOSIS — R2689 Other abnormalities of gait and mobility: Secondary | ICD-10-CM | POA: Diagnosis not present

## 2024-05-02 DIAGNOSIS — M25511 Pain in right shoulder: Secondary | ICD-10-CM | POA: Diagnosis not present

## 2024-05-05 ENCOUNTER — Telehealth (HOSPITAL_COMMUNITY): Payer: Self-pay | Admitting: Internal Medicine

## 2024-05-05 NOTE — Telephone Encounter (Signed)
 Called to confirm/remind patient of their appointment at the [facility] on 05/05/24.   Appointment:   [] Confirmed  [x] Left mess   [] No answer/No voice mail  [] VM Full/unable to leave message  [] Phone not in service  Patient reminded to bring all medications and/or complete list.  Confirmed patient has transportation. Gave directions, instructed to utilize valet parking.

## 2024-05-06 ENCOUNTER — Ambulatory Visit (HOSPITAL_COMMUNITY)
Admission: RE | Admit: 2024-05-06 | Discharge: 2024-05-06 | Disposition: A | Discharge status: Home / Self Care | Source: Ambulatory Visit | Attending: Internal Medicine | Admitting: Internal Medicine

## 2024-05-06 VITALS — BP 150/60 | HR 50 | Wt 146.6 lb

## 2024-05-06 DIAGNOSIS — Z7984 Long term (current) use of oral hypoglycemic drugs: Secondary | ICD-10-CM | POA: Diagnosis not present

## 2024-05-06 DIAGNOSIS — K219 Gastro-esophageal reflux disease without esophagitis: Secondary | ICD-10-CM | POA: Diagnosis not present

## 2024-05-06 DIAGNOSIS — I428 Other cardiomyopathies: Secondary | ICD-10-CM | POA: Insufficient documentation

## 2024-05-06 DIAGNOSIS — E1122 Type 2 diabetes mellitus with diabetic chronic kidney disease: Secondary | ICD-10-CM | POA: Diagnosis not present

## 2024-05-06 DIAGNOSIS — I130 Hypertensive heart and chronic kidney disease with heart failure and stage 1 through stage 4 chronic kidney disease, or unspecified chronic kidney disease: Secondary | ICD-10-CM | POA: Insufficient documentation

## 2024-05-06 DIAGNOSIS — N1832 Chronic kidney disease, stage 3b: Secondary | ICD-10-CM | POA: Insufficient documentation

## 2024-05-06 DIAGNOSIS — I2510 Atherosclerotic heart disease of native coronary artery without angina pectoris: Secondary | ICD-10-CM | POA: Insufficient documentation

## 2024-05-06 DIAGNOSIS — I10 Essential (primary) hypertension: Secondary | ICD-10-CM

## 2024-05-06 DIAGNOSIS — Z7982 Long term (current) use of aspirin: Secondary | ICD-10-CM | POA: Diagnosis not present

## 2024-05-06 DIAGNOSIS — Z7985 Long-term (current) use of injectable non-insulin antidiabetic drugs: Secondary | ICD-10-CM | POA: Diagnosis not present

## 2024-05-06 DIAGNOSIS — I5032 Chronic diastolic (congestive) heart failure: Secondary | ICD-10-CM | POA: Insufficient documentation

## 2024-05-06 DIAGNOSIS — Z79899 Other long term (current) drug therapy: Secondary | ICD-10-CM | POA: Diagnosis not present

## 2024-05-06 DIAGNOSIS — Z853 Personal history of malignant neoplasm of breast: Secondary | ICD-10-CM | POA: Diagnosis not present

## 2024-05-06 DIAGNOSIS — I5022 Chronic systolic (congestive) heart failure: Secondary | ICD-10-CM | POA: Diagnosis not present

## 2024-05-06 MED ORDER — FUROSEMIDE 20 MG PO TABS
60.0000 mg | ORAL_TABLET | Freq: Every day | ORAL | 3 refills | Status: AC
Start: 2024-05-06 — End: ?

## 2024-05-06 NOTE — Patient Instructions (Addendum)
 Medication Changes:  INCREASE FUROSEMIDE  TO 60MG  THREE TABLETS ONCE DAILY   Lab Work:  RETURN FOR LABS IN 2 WEEKS AS SCHEDULED   Follow-Up in: 12 MONTHS PLEASE CALL OUR OFFICE AROUND SEPTEMBER TO GET SCHEDULED FOR YOUR APPOINTMENT. PHONE NUMBER IS [phone] OPTION 2   At the [facility], you and your health needs are our priority. We have a designated team specialized in the treatment of Heart Failure. This Care Team includes your primary Heart Failure Specialized Cardiologist (physician), Advanced Practice Providers (APPs- Physician Assistants and Nurse Practitioners), and Pharmacist who all work together to provide you with the care you need, when you need it.   You may see any of the following providers on your designated Care Team at your next follow up:  Dr. Toribio Fuel Dr. Ezra Shuck Dr. Odis Brownie Greig Mosses, NP Caffie Shed, [place] [facility] [place], [place] Beckey Coe, NP Jordan Lee, NP Tinnie Redman, PharmD   Please be sure to bring in all your medications bottles to every appointment.   Need to Contact Us :  If you have any questions or concerns before your next appointment please send us  a message through [facility] or call our office at [phone].    TO LEAVE A MESSAGE FOR THE NURSE SELECT OPTION 2, PLEASE LEAVE A MESSAGE INCLUDING: YOUR NAME DATE OF BIRTH CALL BACK NUMBER REASON FOR CALL**this is important as we prioritize the call backs  YOU WILL RECEIVE A CALL BACK THE SAME DAY AS LONG AS YOU CALL BEFORE 4:00 PM

## 2024-05-06 NOTE — Addendum Note (Signed)
 Encounter addended by: Tita Andriette NOVAK, RN on: 05/06/2024 3:22 PM  Actions taken: Clinical Note Signed

## 2024-05-06 NOTE — Addendum Note (Signed)
 Encounter addended by: Tita Andriette NOVAK, RN on: 05/06/2024 3:19 PM  Actions taken: Pend clinical note, Clinical Note Signed, Visit diagnoses modified, Order list changed, Diagnosis association updated

## 2024-05-06 NOTE — Progress Notes (Signed)
 [facility] Note   Patient ID: [name], female   DOB: 1932/05/01, 89 y.o.   MRN: [id number] PCP: Dwight Trula SQUIBB, MD Primary HF: Dr Cherrie Oncologist: Dr Layla  HPI: Rebecca Hall is a 89 y.o. female with history of Breast Cancer treated with 3 doses of herceptin  in 2014, CKD, HTN, HL, GERD, Hx of GI Bleed, and DM.  Admitted to [facility] 8/16 with hypovolemic shock, AKI creatinine 4.7. After fluid resuscitation developed pulmonary edema. Found to have new onset LV dysfunction with EF 30-35% in 8/16.. Had cath as noted below with 1v CAD with 60% proximal LAD stenosis and NICM. Discharged on lasix , carvedilol , and Entresto .  Echo 12/16 EF 55-60%,  Echo 7/18: EF 55-60%  Here for f/u with her daughter. Still at [facility]. Balance is poor. Gets PT/OT 2x/week. Uses a walker.  Checks BP daily SBP 130-150s. Rare transient CP. Breathing ok. + LE edema Takes lasix  40 daily and occasionally 60     Echo  02/23/21 EF 65-70% Normal RV G1DD  Cardiac studies:  [facility] 01/27/2015  RA = 11 RV = 42/8/15 PA = 38/8 (24) PCW = 19 Fick cardiac output/index = 5.2/3.0 Thermo CO/CI = 5.7/3.2 PVR = 1.0 WU FA sat = 93% PA sat = 51%, 51% Ao Pressure: 133/60 (85) LV Pressure:  133/9/17 No aortic stenosis  Assessment: 1) Moderate 1v CAD with 60% proximal LAD lesion 2) Non-ischemic CM with EF 30-35% by echo 3) Mildly elevated R-sided pressures with normal cardiac output   Review of systems complete and found to be negative unless listed in HPI.    SH:  Social History   Socioeconomic History   Marital status: Widowed    Spouse name: Not on file   Number of children: Not on file   Years of education: Not on file   Highest education level: Not on file  Occupational History   Not on file  Tobacco Use   Smoking status: Never   Smokeless tobacco: Never  Substance and Sexual Activity   Alcohol use: No   Drug use: No   Sexual activity: Not on file    Comment: first live  birth age 66, P 2, Estrogen   Other Topics Concern   Not on file  Social History Narrative   Not on file   Social Drivers of Health   Financial Resource Strain: Not on file  Food Insecurity: Not on file  Transportation Needs: Not on file  Physical Activity: Not on file  Stress: Not on file  Social Connections: Not on file  Intimate Partner Violence: Not on file    FH:  Family History  Problem Relation Age of Onset   Cerebral aneurysm Father    Cancer Maternal Aunt 69       colon cancer   Cancer Maternal Uncle 65       lung   Cancer Maternal Grandmother        pancreas   Breast cancer Neg Hx     Past Medical History:  Diagnosis Date   Arthritis    Breast cancer (HCC) 03/15/2013   right, 12 o'clock   Chronic kidney disease    Diabetes mellitus without complication (HCC)    GERD (gastroesophageal reflux disease)    History of lower GI bleeding    Hot flashes related to aromatase inhibitor therapy 06/21/2013   Hyperkalemia 12/21/2015   Hyperlipidemia    Hypertension    IBS (irritable bowel syndrome)  Neuromuscular disorder (HCC)    peripheral neuropathy feet   Osteoporosis    Personal history of chemotherapy     Current Outpatient Medications  Medication Sig Dispense Refill   acetaminophen  (TYLENOL ) 650 MG CR tablet Take 650 mg by mouth every 8 (eight) hours as needed for pain.     allopurinol  (ZYLOPRIM ) 100 MG tablet Take 100 mg by mouth daily.     allopurinol  (ZYLOPRIM ) 300 MG tablet Take 300 mg by mouth daily.     amLODipine  (NORVASC ) 2.5 MG tablet TAKE ONE TABLET BY MOUTH ONE TIME DAILY 90 tablet 3   aspirin  81 MG tablet Take 81 mg by mouth at bedtime.      atorvastatin  (LIPITOR) 40 MG tablet TAKE ONE TABLET BY MOUTH IN THE MORNING 30 tablet 10   calcium  carbonate (OS-CAL) 600 MG TABS tablet Take 600 mg by mouth 2 (two) times daily with a meal.      carvedilol  (COREG ) 3.125 MG tablet TAKE ONE TABLET BY MOUTH TWICE DAILY 180 tablet 3   denosumab  (PROLIA )  60 MG/ML SOLN injection Inject 60 mg into the skin every 6 (six) months. Administer in upper arm, thigh, or abdomen     fluticasone  (FLONASE ) 50 MCG/ACT nasal spray Place 2 sprays into both nostrils daily. 11 mL 6   furosemide  (LASIX ) 20 MG tablet Take 1 tab Daily every other day ALTERNATING with 2 tabs Daily every other day 45 tablet 11   gabapentin  (NEURONTIN ) 300 MG capsule TAKE ONE CAPSULE BY MOUTH AT BEDTIME  90 capsule 0   glimepiride  (AMARYL ) 2 MG tablet Take 2 mg by mouth daily with breakfast.     loratadine  (CLARITIN ) 10 MG tablet Take 10 mg by mouth every morning.      Melatonin-Pyridoxine (MELATIN PO) Take 5 mg by mouth at bedtime.     Multiple Vitamin (MULTIVITAMIN WITH MINERALS) TABS tablet Take 1 tablet by mouth 2 (two) times daily.      omeprazole (PRILOSEC) 20 MG capsule Take 20 mg by mouth daily.      sodium chloride  (OCEAN) 0.65 % SOLN nasal spray Place 2 sprays into both nostrils every 4 (four) hours as needed for congestion. 15 mL 0   Dulaglutide  0.75 MG/0.5ML SOPN Inject 0.75 mg into the skin once a week. On Mondays     No current facility-administered medications for this encounter.    Vitals:   05/06/24 1447  BP: (!) 150/60  Pulse: (!) 50  SpO2: 94%  Weight: 66.5 kg (146 lb 9.6 oz)     Wt Readings from Last 3 Encounters:  05/06/24 66.5 kg (146 lb 9.6 oz)  06/19/23 63.5 kg (140 lb)  04/25/23 65.3 kg (144 lb)    PHYSICAL EXAM: General:  Elderly Sitting up No resp difficulty HEENT: normal Neck: supple. no JVD.  Cor: Regular rate & rhythm. No rubs, gallops or murmurs. Lungs: clear Abdomen: soft, nontender, nondistended.Good bowel sounds. Extremities: no cyanosis, clubbing, rash, 2+ edema Neuro: alert & orientedx3, cranial nerves grossly intact. moves all 4 extremities w/o difficulty. Affect pleasant  ECG: Sinus brady 50 1AVB LBBB ( ) Personally reviewed   ASSESSMENT & PLAN:  1. Chronic diastolic CHF:  - She has history of nonischemic  cardiomyopathy (EF 30-35% in 8/16). EF now recovered.  - Echo 12/2016 EF 55-60% Normal LV and RV, mild LVH.  - Echo 02/23/21 EF 65-70% Normal RV G1DD - Functional status limited due to age and debility  - Volume status elevated with 2+ LE edema.  Increase lasix  to 60 daily. Continue compression hose - BMET 2 weeks - Continue carvedilol  3.125 mg BID - Avoiding SGLT2i age and risk of UTI 2. CKD: Stage 3b - Baseline creatinine 1.5-1.8 - Recheck in 2 weks 3. Hx of Breast Cancer: Only received 3 doses of Herceptin  in 2014. Stopped due to possible allergy. 4. DM2 - stable 5. HTN:  -  Relatively well controlled 8. CAD:  - Nonobstructive on prior cath. 8/19 LAD 60% mid - No s/s angina  - Continue atorva 40/ASA - Lipids followed by PCP   Toribio Fuel, MD  3:04 PM

## 2024-05-09 DIAGNOSIS — R2689 Other abnormalities of gait and mobility: Secondary | ICD-10-CM | POA: Diagnosis not present

## 2024-05-09 DIAGNOSIS — M25511 Pain in right shoulder: Secondary | ICD-10-CM | POA: Diagnosis not present

## 2024-05-09 DIAGNOSIS — M6281 Muscle weakness (generalized): Secondary | ICD-10-CM | POA: Diagnosis not present

## 2024-05-16 DIAGNOSIS — R2689 Other abnormalities of gait and mobility: Secondary | ICD-10-CM | POA: Diagnosis not present

## 2024-05-16 DIAGNOSIS — M25511 Pain in right shoulder: Secondary | ICD-10-CM | POA: Diagnosis not present

## 2024-05-23 DIAGNOSIS — E1165 Type 2 diabetes mellitus with hyperglycemia: Secondary | ICD-10-CM | POA: Diagnosis not present

## 2024-05-23 DIAGNOSIS — I129 Hypertensive chronic kidney disease with stage 1 through stage 4 chronic kidney disease, or unspecified chronic kidney disease: Secondary | ICD-10-CM | POA: Diagnosis not present

## 2024-05-23 DIAGNOSIS — N1832 Chronic kidney disease, stage 3b: Secondary | ICD-10-CM | POA: Diagnosis not present

## 2024-05-23 DIAGNOSIS — I5032 Chronic diastolic (congestive) heart failure: Secondary | ICD-10-CM | POA: Diagnosis not present

## 2024-05-23 DIAGNOSIS — E1122 Type 2 diabetes mellitus with diabetic chronic kidney disease: Secondary | ICD-10-CM | POA: Diagnosis not present

## 2024-05-24 ENCOUNTER — Ambulatory Visit (HOSPITAL_COMMUNITY)
Admission: RE | Admit: 2024-05-24 | Discharge: 2024-05-24 | Disposition: A | Discharge status: Home / Self Care | Source: Ambulatory Visit | Attending: Cardiology | Admitting: Cardiology

## 2024-05-24 DIAGNOSIS — I5022 Chronic systolic (congestive) heart failure: Secondary | ICD-10-CM | POA: Diagnosis not present

## 2024-05-24 DIAGNOSIS — M25511 Pain in right shoulder: Secondary | ICD-10-CM | POA: Diagnosis not present

## 2024-05-24 DIAGNOSIS — M6281 Muscle weakness (generalized): Secondary | ICD-10-CM | POA: Diagnosis not present

## 2024-05-24 DIAGNOSIS — R2689 Other abnormalities of gait and mobility: Secondary | ICD-10-CM | POA: Diagnosis not present

## 2024-05-24 LAB — BASIC METABOLIC PANEL WITH GFR
Anion gap: 12 (ref 5–15)
BUN: 38 mg/dL — ABNORMAL HIGH (ref 8–23)
CO2: 26 mmol/L (ref 22–32)
Calcium: 9 mg/dL (ref 8.9–10.3)
Chloride: 102 mmol/L (ref 98–111)
Creatinine, Ser: 1.12 mg/dL — ABNORMAL HIGH (ref 0.44–1.00)
GFR, Estimated: 47 mL/min — ABNORMAL LOW (ref >60)
Glucose, Bld: 108 mg/dL — ABNORMAL HIGH (ref 70–99)
Potassium: 3.9 mmol/L (ref 3.5–5.1)
Sodium: 140 mmol/L (ref 135–145)

## 2024-06-01 DIAGNOSIS — H16223 Keratoconjunctivitis sicca, not specified as Sjogren's, bilateral: Secondary | ICD-10-CM | POA: Diagnosis not present
